# Patient Record
Sex: Female | Born: 1960 | Race: White | Hispanic: No | Marital: Single | Smoking: Former smoker
Health system: Southern US, Community
[De-identification: ages and names within clinical notes are randomized; demographics above are authoritative.]

## PROBLEM LIST (undated history)

## (undated) DIAGNOSIS — M545 Low back pain, unspecified: Secondary | ICD-10-CM

## (undated) DIAGNOSIS — R002 Palpitations: Secondary | ICD-10-CM

## (undated) DIAGNOSIS — N83209 Unspecified ovarian cyst, unspecified side: Secondary | ICD-10-CM

## (undated) DIAGNOSIS — Z923 Personal history of irradiation: Secondary | ICD-10-CM

## (undated) DIAGNOSIS — I471 Supraventricular tachycardia, unspecified: Secondary | ICD-10-CM

## (undated) DIAGNOSIS — C50312 Malignant neoplasm of lower-inner quadrant of left female breast: Secondary | ICD-10-CM

## (undated) DIAGNOSIS — Z171 Estrogen receptor negative status [ER-]: Secondary | ICD-10-CM

## (undated) DIAGNOSIS — D649 Anemia, unspecified: Secondary | ICD-10-CM

## (undated) DIAGNOSIS — K219 Gastro-esophageal reflux disease without esophagitis: Secondary | ICD-10-CM

## (undated) DIAGNOSIS — K5732 Diverticulitis of large intestine without perforation or abscess without bleeding: Secondary | ICD-10-CM

## (undated) DIAGNOSIS — Z8632 Personal history of gestational diabetes: Secondary | ICD-10-CM

## (undated) DIAGNOSIS — Z9221 Personal history of antineoplastic chemotherapy: Secondary | ICD-10-CM

## (undated) DIAGNOSIS — Z87442 Personal history of urinary calculi: Secondary | ICD-10-CM

## (undated) DIAGNOSIS — K621 Rectal polyp: Secondary | ICD-10-CM

## (undated) DIAGNOSIS — E119 Type 2 diabetes mellitus without complications: Secondary | ICD-10-CM

## (undated) DIAGNOSIS — E7800 Pure hypercholesterolemia, unspecified: Secondary | ICD-10-CM

## (undated) DIAGNOSIS — G8929 Other chronic pain: Secondary | ICD-10-CM

## (undated) DIAGNOSIS — E038 Other specified hypothyroidism: Secondary | ICD-10-CM

## (undated) DIAGNOSIS — E039 Hypothyroidism, unspecified: Secondary | ICD-10-CM

## (undated) DIAGNOSIS — E669 Obesity, unspecified: Secondary | ICD-10-CM

## (undated) DIAGNOSIS — E041 Nontoxic single thyroid nodule: Secondary | ICD-10-CM

## (undated) DIAGNOSIS — D121 Benign neoplasm of appendix: Secondary | ICD-10-CM

## (undated) HISTORY — DX: Benign neoplasm of appendix: D12.1

## (undated) HISTORY — PX: TUBAL LIGATION: SHX77

## (undated) HISTORY — DX: Palpitations: R00.2

## (undated) HISTORY — DX: Type 2 diabetes mellitus without complications: E11.9

## (undated) HISTORY — DX: Other specified hypothyroidism: E03.8

## (undated) HISTORY — DX: Diverticulitis of large intestine without perforation or abscess without bleeding: K57.32

## (undated) HISTORY — PX: OTHER SURGICAL HISTORY: SHX169

## (undated) HISTORY — PX: SMALL INTESTINE SURGERY: SHX150

## (undated) HISTORY — DX: Personal history of gestational diabetes: Z86.32

## (undated) HISTORY — DX: Unspecified ovarian cyst, unspecified side: N83.209

---
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
## 2007-12-09 HISTORY — PX: BLADDER SUSPENSION: SHX72

## 2009-09-06 ENCOUNTER — Ambulatory Visit: Payer: Self-pay | Admitting: Unknown Physician Specialty

## 2009-09-13 ENCOUNTER — Ambulatory Visit: Payer: Self-pay | Admitting: Unknown Physician Specialty

## 2009-10-11 ENCOUNTER — Ambulatory Visit: Payer: Self-pay | Admitting: Unknown Physician Specialty

## 2009-10-17 ENCOUNTER — Ambulatory Visit: Payer: Self-pay | Admitting: Unknown Physician Specialty

## 2010-02-23 ENCOUNTER — Emergency Department: Payer: Self-pay | Admitting: Internal Medicine

## 2012-08-08 DIAGNOSIS — K5732 Diverticulitis of large intestine without perforation or abscess without bleeding: Secondary | ICD-10-CM

## 2012-08-08 HISTORY — DX: Diverticulitis of large intestine without perforation or abscess without bleeding: K57.32

## 2012-08-13 LAB — COMPREHENSIVE METABOLIC PANEL
Albumin: 3.8 g/dL (ref 3.4–5.0)
Anion Gap: 11 (ref 7–16)
Bilirubin,Total: 0.6 mg/dL (ref 0.2–1.0)
Calcium, Total: 9.5 mg/dL (ref 8.5–10.1)
Chloride: 106 mmol/L (ref 98–107)
Co2: 23 mmol/L (ref 21–32)
EGFR (African American): >60
Osmolality: 279 (ref 275–301)
Potassium: 3.6 mmol/L (ref 3.5–5.1)
SGOT(AST): 23 U/L (ref 15–37)
Sodium: 140 mmol/L (ref 136–145)

## 2012-08-13 LAB — URINALYSIS, COMPLETE
Bilirubin,UR: NEGATIVE
Blood: NEGATIVE
Glucose,UR: NEGATIVE mg/dL (ref 0–75)
Ketone: NEGATIVE
Leukocyte Esterase: NEGATIVE
Ph: 5 (ref 4.5–8.0)
Squamous Epithelial: 5

## 2012-08-13 LAB — PROTIME-INR: Prothrombin Time: 13.8 secs (ref 11.5–14.7)

## 2012-08-13 LAB — CBC
HCT: 39.7 % (ref 35.0–47.0)
MCHC: 34.8 g/dL (ref 32.0–36.0)
RBC: 4.48 10*6/uL (ref 3.80–5.20)
RDW: 13.9 % (ref 11.5–14.5)

## 2012-08-13 LAB — ACETAMINOPHEN LEVEL: Acetaminophen: <2 ug/mL

## 2012-08-14 ENCOUNTER — Inpatient Hospital Stay: Payer: Self-pay | Admitting: Internal Medicine

## 2012-08-16 LAB — CBC WITH DIFFERENTIAL/PLATELET
Basophil #: 0.1 10*3/uL (ref 0.0–0.1)
Basophil %: 0.8 %
Eosinophil %: 2.8 %
Lymphocyte #: 2.5 10*3/uL (ref 1.0–3.6)
Lymphocyte %: 28.7 %
MCH: 30.8 pg (ref 26.0–34.0)
MCHC: 34.8 g/dL (ref 32.0–36.0)
MCV: 89 fL (ref 80–100)
Monocyte %: 7.7 %
Neutrophil #: 5.3 10*3/uL (ref 1.4–6.5)
Platelet: 285 10*3/uL (ref 150–440)
RBC: 3.86 10*6/uL (ref 3.80–5.20)
RDW: 13.8 % (ref 11.5–14.5)

## 2012-08-16 LAB — BASIC METABOLIC PANEL
Anion Gap: 8 (ref 7–16)
BUN: 5 mg/dL — ABNORMAL LOW (ref 7–18)
Calcium, Total: 8.5 mg/dL (ref 8.5–10.1)
EGFR (African American): >60
EGFR (Non-African Amer.): >60
Glucose: 145 mg/dL — ABNORMAL HIGH (ref 65–99)
Osmolality: 279 (ref 275–301)
Sodium: 140 mmol/L (ref 136–145)

## 2012-09-01 ENCOUNTER — Other Ambulatory Visit (HOSPITAL_COMMUNITY)
Admission: RE | Admit: 2012-09-01 | Discharge: 2012-09-01 | Disposition: A | Discharge status: Home / Self Care | Payer: PRIVATE HEALTH INSURANCE | Source: Ambulatory Visit | Attending: Family Medicine | Admitting: Family Medicine

## 2012-09-01 ENCOUNTER — Encounter: Payer: Self-pay | Admitting: Family Medicine

## 2012-09-01 ENCOUNTER — Encounter: Payer: Self-pay | Admitting: Internal Medicine

## 2012-09-01 ENCOUNTER — Ambulatory Visit (INDEPENDENT_AMBULATORY_CARE_PROVIDER_SITE_OTHER): Payer: PRIVATE HEALTH INSURANCE | Admitting: Family Medicine

## 2012-09-01 VITALS — BP 120/80 | HR 88 | Temp 97.9°F | Ht 65.25 in | Wt 217.0 lb

## 2012-09-01 DIAGNOSIS — Z01419 Encounter for gynecological examination (general) (routine) without abnormal findings: Secondary | ICD-10-CM | POA: Insufficient documentation

## 2012-09-01 DIAGNOSIS — Z1151 Encounter for screening for human papillomavirus (HPV): Secondary | ICD-10-CM | POA: Insufficient documentation

## 2012-09-01 DIAGNOSIS — Z1231 Encounter for screening mammogram for malignant neoplasm of breast: Secondary | ICD-10-CM

## 2012-09-01 DIAGNOSIS — N83209 Unspecified ovarian cyst, unspecified side: Secondary | ICD-10-CM | POA: Insufficient documentation

## 2012-09-01 DIAGNOSIS — E669 Obesity, unspecified: Secondary | ICD-10-CM | POA: Insufficient documentation

## 2012-09-01 DIAGNOSIS — Z136 Encounter for screening for cardiovascular disorders: Secondary | ICD-10-CM

## 2012-09-01 DIAGNOSIS — Z0000 Encounter for general adult medical examination without abnormal findings: Secondary | ICD-10-CM | POA: Insufficient documentation

## 2012-09-01 DIAGNOSIS — K5732 Diverticulitis of large intestine without perforation or abscess without bleeding: Secondary | ICD-10-CM | POA: Insufficient documentation

## 2012-09-01 DIAGNOSIS — Z1211 Encounter for screening for malignant neoplasm of colon: Secondary | ICD-10-CM

## 2012-09-01 LAB — COMPREHENSIVE METABOLIC PANEL
BUN: 10 mg/dL (ref 6–23)
CO2: 29 mEq/L (ref 19–32)
Calcium: 9.5 mg/dL (ref 8.4–10.5)
Chloride: 102 mEq/L (ref 96–112)
Creatinine, Ser: 0.9 mg/dL (ref 0.4–1.2)
GFR: 72.26 mL/min (ref >60.00)
Total Bilirubin: 0.7 mg/dL (ref 0.3–1.2)

## 2012-09-01 LAB — LIPID PANEL
Cholesterol: 221 mg/dL — ABNORMAL HIGH (ref 0–200)
HDL: 36.7 mg/dL — ABNORMAL LOW (ref >39.00)
Triglycerides: 179 mg/dL — ABNORMAL HIGH (ref 0.0–149.0)

## 2012-09-01 NOTE — Patient Instructions (Addendum)
It was wonderful to meet you. Please stop by to see Shirlee Limerick on your way out to set up your colonoscopy, ultrasound and mammogram  We will call you with your lab results and send a letter with your pap smear results if normal.  Health Maintenance, Females A healthy lifestyle and preventative care can promote health and wellness.  Maintain regular health, dental, and eye exams.   Eat a healthy diet. Foods like vegetables, fruits, whole grains, low-fat dairy products, and lean protein foods contain the nutrients you need without too many calories. Decrease your intake of foods high in solid fats, added sugars, and salt. Get information about a proper diet from your caregiver, if necessary.   Regular physical exercise is one of the most important things you can do for your health. Most adults should get at least 150 minutes of moderate-intensity exercise (any activity that increases your heart rate and causes you to sweat) each week. In addition, most adults need muscle-strengthening exercises on 2 or more days a week.    Maintain a healthy weight. The body mass index (BMI) is a screening tool to identify possible weight problems. It provides an estimate of body fat based on height and weight. Your caregiver can help determine your BMI, and can help you achieve or maintain a healthy weight. For adults 20 years and older:   A BMI below 18.5 is considered underweight.   A BMI of 18.5 to 24.9 is normal.   A BMI of 25 to 29.9 is considered overweight.   A BMI of 30 and above is considered obese.   Maintain normal blood lipids and cholesterol by exercising and minimizing your intake of saturated fat. Eat a balanced diet with plenty of fruits and vegetables. Blood tests for lipids and cholesterol should begin at age 62 and be repeated every 5 years. If your lipid or cholesterol levels are high, you are over 50, or you are a high risk for heart disease, you may need your cholesterol levels checked more  frequently.Ongoing high lipid and cholesterol levels should be treated with medicines if diet and exercise are not effective.   If you smoke, find out from your caregiver how to quit. If you do not use tobacco, do not start.   If you are pregnant, do not drink alcohol. If you are breastfeeding, be very cautious about drinking alcohol. If you are not pregnant and choose to drink alcohol, do not exceed 1 drink per day. One drink is considered to be 12 ounces (355 mL) of beer, 5 ounces (148 mL) of wine, or 1.5 ounces (44 mL) of liquor.   Avoid use of street drugs. Do not share needles with anyone. Ask for help if you need support or instructions about stopping the use of drugs.   High blood pressure causes heart disease and increases the risk of stroke. Blood pressure should be checked at least every 1 to 2 years. Ongoing high blood pressure should be treated with medicines, if weight loss and exercise are not effective.   If you are 56 to 50 years old, ask your caregiver if you should take aspirin to prevent strokes.   Diabetes screening involves taking a blood sample to check your fasting blood sugar level. This should be done once every 3 years, after age 57, if you are within normal weight and without risk factors for diabetes. Testing should be considered at a younger age or be carried out more frequently if you are overweight and have  at least 1 risk factor for diabetes.   Breast cancer screening is essential preventative care for women. You should practice "breast self-awareness." This means understanding the normal appearance and feel of your breasts and may include breast self-examination. Any changes detected, no matter how small, should be reported to a caregiver. Women in their 12s and 30s should have a clinical breast exam (CBE) by a caregiver as part of a regular health exam every 1 to 3 years. After age 61, women should have a CBE every year. Starting at age 67, women should consider  having a mammogram (breast X-ray) every year. Women who have a family history of breast cancer should talk to their caregiver about genetic screening. Women at a high risk of breast cancer should talk to their caregiver about having an MRI and a mammogram every year.   The Pap test is a screening test for cervical cancer. Women should have a Pap test starting at age 90. Between ages 74 and 103, Pap tests should be repeated every 2 years. Beginning at age 73, you should have a Pap test every 3 years as long as the past 3 Pap tests have been normal. If you had a hysterectomy for a problem that was not cancer or a condition that could lead to cancer, then you no longer need Pap tests. If you are between ages 46 and 34, and you have had normal Pap tests going back 10 years, you no longer need Pap tests. If you have had past treatment for cervical cancer or a condition that could lead to cancer, you need Pap tests and screening for cancer for at least 20 years after your treatment. If Pap tests have been discontinued, risk factors (such as a new sexual partner) need to be reassessed to determine if screening should be resumed. Some women have medical problems that increase the chance of getting cervical cancer. In these cases, your caregiver may recommend more frequent screening and Pap tests.   The human papillomavirus (HPV) test is an additional test that may be used for cervical cancer screening. The HPV test looks for the virus that can cause the cell changes on the cervix. The cells collected during the Pap test can be tested for HPV. The HPV test could be used to screen women aged 19 years and older, and should be used in women of any age who have unclear Pap test results. After the age of 1, women should have HPV testing at the same frequency as a Pap test.   Colorectal cancer can be detected and often prevented. Most routine colorectal cancer screening begins at the age of 72 and continues through age 49.  However, your caregiver may recommend screening at an earlier age if you have risk factors for colon cancer. On a yearly basis, your caregiver may provide home test kits to check for hidden blood in the stool. Use of a small camera at the end of a tube, to directly examine the colon (sigmoidoscopy or colonoscopy), can detect the earliest forms of colorectal cancer. Talk to your caregiver about this at age 2, when routine screening begins. Direct examination of the colon should be repeated every 5 to 10 years through age 66, unless early forms of pre-cancerous polyps or small growths are found.   Hepatitis C blood testing is recommended for all people born from [birth year] through 1965 and any individual with known risks for hepatitis C.   Practice safe sex. Use condoms and avoid high-risk sexual  practices to reduce the spread of sexually transmitted infections (STIs). Sexually active women aged 63 and younger should be checked for Chlamydia, which is a common sexually transmitted infection. Older women with new or multiple partners should also be tested for Chlamydia. Testing for other STIs is recommended if you are sexually active and at increased risk.   Osteoporosis is a disease in which the bones lose minerals and strength with aging. This can result in serious bone fractures. The risk of osteoporosis can be identified using a bone density scan. Women ages 42 and over and women at risk for fractures or osteoporosis should discuss screening with their caregivers. Ask your caregiver whether you should be taking a calcium supplement or vitamin D to reduce the rate of osteoporosis.   Menopause can be associated with physical symptoms and risks. Hormone replacement therapy is available to decrease symptoms and risks. You should talk to your caregiver about whether hormone replacement therapy is right for you.   Use sunscreen with a sun protection factor (SPF) of 30 or greater. Apply sunscreen liberally and  repeatedly throughout the day. You should seek shade when your shadow is shorter than you. Protect yourself by wearing long sleeves, pants, a wide-brimmed hat, and sunglasses year round, whenever you are outdoors.   Notify your caregiver of new moles or changes in moles, especially if there is a change in shape or color. Also notify your caregiver if a mole is larger than the size of a pencil eraser.   Stay current with your immunizations.  Document Released: 06/09/2011 Document Revised: 11/13/2011 Document Reviewed: 06/09/2011 [facility] Patient Information 2012 [place], [place].

## 2012-09-01 NOTE — Progress Notes (Signed)
Subjective:    Patient ID: Rebecca Macias, female    DOB: [birth year], 51 y.o.   MRN: [id number]  HPI  51 yo very pleasant G6P5 with no significant medical problems here to establish care, for CPX and hospital follow up.  Has not been to a physician in several years.  Admitted to [facility] 08/14/2012 - 08/17/2012.  Notes reviewed.  Admitted for acute sigmoid diverticulitis. CT scan of abdomen and pelvis on 9/7 showed sigmoid diverticulitis with ?suspected perforation.  No abscess.  Incidentally, a 2 cm right ovarian cyst was also noted.  General surgery was consulted and conservative, non surgical intervention was recommended.  Symptoms improved with IV cipro and flagyl.  Discharged home with two week course of cipro and flagyl.  Symptoms have resolved.  Has never had a colonoscopy. No blood in stool.  Periods are becoming irregular- has not had one in 4 months.  No h/o abnormal pap smears, no family h/o breast, uterine or ovarian CA.  Patient Active Problem List  Diagnosis  . Sigmoid diverticulitis  . Ovarian cyst  . Routine general medical examination at a health care facility   Past Medical History  Diagnosis Date  . Sigmoid diverticulitis 08/2012  . Ovarian cyst   . H/O gestational diabetes mellitus, not currently pregnant    Past Surgical History  Procedure Date  . Tubal ligation   . Bladder suspension 2009   History  Substance Use Topics  . Smoking status: Former Games developer  . Smokeless tobacco: Not on file  . Alcohol Use: Not on file   Family History  Problem Relation Age of Onset  . Heart disease Father   . Kidney disease Father    No Known Allergies No current outpatient prescriptions on file prior to visit.   The PMH, PSH, Social History, Family History, Medications, and allergies have been reviewed in [facility], and have been updated if relevant.   Review of Systems See HPI Patient reports no  vision/ hearing changes,anorexia, weight change, fever  ,adenopathy, persistant / recurrent hoarseness, swallowing issues, chest pain, edema,persistant / recurrent cough, hemoptysis, dyspnea(rest, exertional, paroxysmal nocturnal), gastrointestinal  bleeding (melena, rectal bleeding), abdominal pain, excessive heart burn, GU symptoms(dysuria, hematuria, pyuria, voiding/incontinence  Issues) syncope, focal weakness, severe memory loss, concerning skin lesions, depression, anxiety, abnormal bruising/bleeding, major joint swelling, breast masses     Objective:   Physical Exam BP 120/80  Pulse 88  Temp 97.9 F (36.6 C)  Ht 5' 5.25" (1.657 m)  Wt 217 lb (98.431 kg)  BMI 35.83 kg/m2  General:  Obese, pleasant female,well-nourished,in no acute distress; alert,appropriate and cooperative throughout examination Head:  normocephalic and atraumatic.   Eyes:  vision grossly intact, pupils equal, pupils round, and pupils reactive to light.   Ears:  R ear normal and L ear normal.   Nose:  no external deformity.   Mouth:  good dentition.   Neck:  No deformities, masses, or tenderness noted. Breasts:  No mass, nodules, thickening, tenderness, bulging, retraction, inflamation, nipple discharge  Bilateral raised lesions (?SK) on nipples bilaterally- per pt have been there for as long as she can remember Lungs:  Normal respiratory effort, chest expands symmetrically. Lungs are clear to auscultation, no crackles or wheezes. Heart:  Normal rate and regular rhythm. S1 and S2 normal without gallop, murmur, click, rub or other extra sounds. Abdomen:  Bowel sounds positive,abdomen soft and non-tender without masses, organomegaly or hernias noted. Rectal:  no external abnormalities.   Genitalia:  Pelvic Exam:  External: normal female genitalia without lesions or masses        Vagina: normal without lesions or masses        Cervix: normal without lesions or masses        Adnexa: normal bimanual exam without masses or fullness        Uterus: normal by palpation         Pap smear: performed Msk:  No deformity or scoliosis noted of thoracic or lumbar spine.   Extremities:  No clubbing, cyanosis, edema, or deformity noted with normal full range of motion of all joints.   Neurologic:  alert & oriented X3 and gait normal.   Skin:  Intact without suspicious lesions or rashes Cervical Nodes:  No lymphadenopathy noted Axillary Nodes:  No palpable lymphadenopathy Psych:  Cognition and judgment appear intact. Alert and cooperative with normal attention span and concentration. No apparent delusions, illusions, hallucinations    Assessment & Plan:   1. Routine general medical examination at a health care facility  Reviewed preventive care protocols, scheduled due services, and updated immunizations Discussed nutrition, exercise, diet, and healthy lifestyle.  Comprehensive metabolic panel, Cytology - PAP  2. Sigmoid diverticulitis  S/p conservative management with abx. Ambulatory referral to Gastroenterology  3. Ovarian cyst  New on CT- will order pelvic ultrasound for further evaluation. The patient indicates understanding of these issues and agrees with the plan.  US Pelvis Complete, US Transvaginal Non-OB  4. Other screening mammogram  MM Digital Screening  5. Screening for ischemic heart disease  Lipid Panel  6. Screening for colon cancer  Ambulatory referral to Gastroenterology  7. Obesity (BMI 35.0-39.9 without comorbidity)  She walks quite a bit at work.  Has gained weight since quitting smoking.  She is interested in losing weight.  Will work on diet and call me in a few months.

## 2012-09-02 ENCOUNTER — Encounter: Payer: Self-pay | Admitting: *Deleted

## 2012-09-07 ENCOUNTER — Encounter: Payer: Self-pay | Admitting: Family Medicine

## 2012-09-07 ENCOUNTER — Encounter: Payer: Self-pay | Admitting: *Deleted

## 2012-09-13 ENCOUNTER — Encounter: Payer: Self-pay | Admitting: Family Medicine

## 2012-09-13 ENCOUNTER — Other Ambulatory Visit: Payer: Self-pay | Admitting: Family Medicine

## 2012-09-13 ENCOUNTER — Ambulatory Visit: Payer: Self-pay | Admitting: Family Medicine

## 2012-09-13 DIAGNOSIS — N83209 Unspecified ovarian cyst, unspecified side: Secondary | ICD-10-CM

## 2012-09-18 ENCOUNTER — Ambulatory Visit: Payer: Self-pay | Admitting: Family Medicine

## 2012-09-20 ENCOUNTER — Encounter: Payer: Self-pay | Admitting: Family Medicine

## 2012-09-28 ENCOUNTER — Ambulatory Visit: Payer: Self-pay | Admitting: Family Medicine

## 2012-10-26 ENCOUNTER — Ambulatory Visit (AMBULATORY_SURGERY_CENTER): Payer: Commercial Managed Care - PPO

## 2012-10-26 VITALS — Ht 65.0 in | Wt 222.6 lb

## 2012-10-26 DIAGNOSIS — Z1211 Encounter for screening for malignant neoplasm of colon: Secondary | ICD-10-CM

## 2012-10-26 MED ORDER — MOVIPREP 100 G PO SOLR: ORAL | Status: DC

## 2012-11-09 ENCOUNTER — Encounter: Payer: Self-pay | Admitting: Internal Medicine

## 2012-11-09 ENCOUNTER — Ambulatory Visit (AMBULATORY_SURGERY_CENTER): Payer: Commercial Managed Care - PPO | Admitting: Internal Medicine

## 2012-11-09 VITALS — BP 127/72 | HR 89 | Temp 99.0°F | Resp 18 | Ht 65.0 in | Wt 222.0 lb

## 2012-11-09 DIAGNOSIS — R933 Abnormal findings on diagnostic imaging of other parts of digestive tract: Secondary | ICD-10-CM

## 2012-11-09 DIAGNOSIS — Z1211 Encounter for screening for malignant neoplasm of colon: Secondary | ICD-10-CM

## 2012-11-09 MED ORDER — SODIUM CHLORIDE 0.9 % IV SOLN: 500.0000 mL | INTRAVENOUS | Status: DC

## 2012-11-09 NOTE — Op Note (Signed)
[facility] [address] [place], [address]   COLONOSCOPY PROCEDURE REPORT  PATIENT: Rebecca, Macias  MR#: [id number] BIRTHDATE: [birth year] , 51  yrs. old GENDER: Female ENDOSCOPIST: Hart Carwin, MD REFERRED BY:  Ruthe Mannan, M.D. PROCEDURE DATE:  11/09/2012 PROCEDURE:   Colonoscopy, screening and Colonoscopy, diagnostic ASA CLASS:   Class I INDICATIONS:Average risk patient for colon cancer and recent diverticulitis with confined perforation. MEDICATIONS: MAC sedation, administered by CRNA, Fentanyl-Quick Pick, and propofol (Diprivan) 300mg  IV  DESCRIPTION OF PROCEDURE:   After the risks and benefits and of the procedure were explained, informed consent was obtained.  A digital rectal exam revealed no abnormalities of the rectum.    The LB [id number]-[id number]  endoscope was introduced through the anus and advanced to the cecum, which was identified by both the appendix and ileocecal valve .  The quality of the prep was good, using MoviPrep .  The instrument was then slowly withdrawn as the colon was fully examined.     COLON FINDINGS: There was moderate diverticulosis noted in the sigmoid colon with associated colonic narrowing and muscular hypertrophy.     Retroflexed views revealed no abnormalities. The scope was then withdrawn from the patient and the procedure completed.  COMPLICATIONS: There were no complications. ENDOSCOPIC IMPRESSION: There was moderate diverticulosis noted in the sigmoid colon between 25-30 cm, no active inflammation, no obstruction  RECOMMENDATIONS: High fiber diet Metamucil 1 tsp daily consider segmental resection of the colon to prevent future attacks   REPEAT EXAM: In 10 year(s)  for Colonoscopy.  cc:  _______________________________ eSignedHart Carwin, MD 11/09/2012 9:40 AM     PATIENT NAME:  Rebecca, Macias MR#: [id number]

## 2012-11-09 NOTE — Patient Instructions (Signed)
METAMUCIL 1 TSP. DAILY. HIGH FIBER DIET.   YOU HAD AN ENDOSCOPIC PROCEDURE TODAY AT THE [facility]: Refer to the procedure report that was given to you for any specific questions about what was found during the examination.  If the procedure report does not answer your questions, please call your gastroenterologist to clarify.  If you requested that your care partner not be given the details of your procedure findings, then the procedure report has been included in a sealed envelope for you to review at your convenience later.  YOU SHOULD EXPECT: Some feelings of bloating in the abdomen. Passage of more gas than usual.  Walking can help get rid of the air that was put into your GI tract during the procedure and reduce the bloating. If you had a lower endoscopy (such as a colonoscopy or flexible sigmoidoscopy) you may notice spotting of blood in your stool or on the toilet paper. If you underwent a bowel prep for your procedure, then you may not have a normal bowel movement for a few days.  DIET: Your first meal following the procedure should be a light meal and then it is ok to progress to your normal diet.  A half-sandwich or bowl of soup is an example of a good first meal.  Heavy or fried foods are harder to digest and may make you feel nauseous or bloated.  Likewise meals heavy in dairy and vegetables can cause extra gas to form and this can also increase the bloating.  Drink plenty of fluids but you should avoid alcoholic beverages for 24 hours.  ACTIVITY: Your care partner should take you home directly after the procedure.  You should plan to take it easy, moving slowly for the rest of the day.  You can resume normal activity the day after the procedure however you should NOT DRIVE or use heavy machinery for 24 hours (because of the sedation medicines used during the test).    SYMPTOMS TO REPORT IMMEDIATELY: A gastroenterologist can be reached at any hour.  During normal business  hours, 8:30 AM to 5:00 PM Monday through Friday, call [phone].  After hours and on weekends, please call the GI answering service at [phone] who will take a message and have the physician on call contact you.   Following lower endoscopy (colonoscopy or flexible sigmoidoscopy):  Excessive amounts of blood in the stool  Significant tenderness or worsening of abdominal pains  Swelling of the abdomen that is new, acute  Fever of 100F or higher  FOLLOW UP: If any biopsies were taken you will be contacted by phone or by letter within the next 1-3 weeks.  Call your gastroenterologist if you have not heard about the biopsies in 3 weeks.  Our staff will call the home number listed on your records the next business day following your procedure to check on you and address any questions or concerns that you may have at that time regarding the information given to you following your procedure. This is a courtesy call and so if there is no answer at the home number and we have not heard from you through the emergency physician on call, we will assume that you have returned to your regular daily activities without incident.  SIGNATURES/CONFIDENTIALITY: You and/or your care partner have signed paperwork which will be entered into your electronic medical record.  These signatures attest to the fact that that the information above on your After Visit Summary has been reviewed and is  understood.  Full responsibility of the confidentiality of this discharge information lies with you and/or your care-partner.

## 2012-11-09 NOTE — Progress Notes (Signed)
Pt stable  Report to RN Awake talkative

## 2012-11-09 NOTE — Progress Notes (Signed)
Patient did not experience any of the following events: a burn prior to discharge; a fall within the facility; wrong site/side/patient/procedure/implant event; or a hospital transfer or hospital admission upon discharge from the facility. (G8907) Patient did not have preoperative order for IV antibiotic SSI prophylaxis. (G8918)  

## 2012-11-10 ENCOUNTER — Telehealth: Payer: Self-pay | Admitting: *Deleted

## 2012-11-10 NOTE — Telephone Encounter (Signed)
  Follow up Call-  Call back number 11/09/2012  Post procedure Call Back phone  # [phone]  Permission to leave phone message Yes     Patient questions:  Do you have a fever, pain , or abdominal swelling? no Pain Score  0 *  Have you tolerated food without any problems? yes  Have you been able to return to your normal activities? yes  Do you have any questions about your discharge instructions: Diet   no Medications  no Follow up visit  no  Do you have questions or concerns about your Care? no  Actions: * If pain score is 4 or above: No action needed, pain <4.

## 2012-12-10 ENCOUNTER — Other Ambulatory Visit: Payer: PRIVATE HEALTH INSURANCE

## 2012-12-16 ENCOUNTER — Other Ambulatory Visit (INDEPENDENT_AMBULATORY_CARE_PROVIDER_SITE_OTHER): Payer: Commercial Managed Care - PPO

## 2012-12-16 DIAGNOSIS — E785 Hyperlipidemia, unspecified: Secondary | ICD-10-CM

## 2012-12-16 LAB — LIPID PANEL
Cholesterol: 218 mg/dL — ABNORMAL HIGH (ref 0–200)
HDL: 39 mg/dL — ABNORMAL LOW (ref >39.00)
Total CHOL/HDL Ratio: 6
Triglycerides: 200 mg/dL — ABNORMAL HIGH (ref 0.0–149.0)
VLDL: 40 mg/dL (ref 0.0–40.0)

## 2012-12-16 LAB — LDL CHOLESTEROL, DIRECT: Direct LDL: 144.9 mg/dL

## 2013-09-18 ENCOUNTER — Inpatient Hospital Stay: Payer: Self-pay | Admitting: Surgery

## 2013-09-18 LAB — URINALYSIS, COMPLETE
Bilirubin,UR: NEGATIVE
Blood: NEGATIVE
Glucose,UR: NEGATIVE mg/dL (ref 0–75)
Ketone: NEGATIVE
Nitrite: NEGATIVE
Ph: 6 (ref 4.5–8.0)
Protein: NEGATIVE
Squamous Epithelial: 7

## 2013-09-18 LAB — CBC
HGB: 13.8 g/dL (ref 12.0–16.0)
MCH: 30 pg (ref 26.0–34.0)
MCV: 87 fL (ref 80–100)
RBC: 4.6 10*6/uL (ref 3.80–5.20)

## 2013-09-18 LAB — LIPASE, BLOOD: Lipase: 210 U/L (ref 73–393)

## 2013-09-18 LAB — COMPREHENSIVE METABOLIC PANEL
Alkaline Phosphatase: 106 U/L (ref 50–136)
Anion Gap: 6 — ABNORMAL LOW (ref 7–16)
Bilirubin,Total: 0.4 mg/dL (ref 0.2–1.0)
Chloride: 103 mmol/L (ref 98–107)
Co2: 28 mmol/L (ref 21–32)
EGFR (African American): >60
Osmolality: 277 (ref 275–301)
Potassium: 4 mmol/L (ref 3.5–5.1)
SGOT(AST): 21 U/L (ref 15–37)
SGPT (ALT): 22 U/L (ref 12–78)
Sodium: 137 mmol/L (ref 136–145)

## 2013-09-19 LAB — CBC WITH DIFFERENTIAL/PLATELET
Eosinophil %: 1.6 %
HCT: 35.8 % (ref 35.0–47.0)
Lymphocyte #: 3.3 10*3/uL (ref 1.0–3.6)
MCH: 30.2 pg (ref 26.0–34.0)
MCHC: 34.6 g/dL (ref 32.0–36.0)
MCV: 87 fL (ref 80–100)
Monocyte #: 0.9 x10 3/mm (ref 0.2–0.9)
Monocyte %: 8.7 %
Platelet: 341 10*3/uL (ref 150–440)
RBC: 4.11 10*6/uL (ref 3.80–5.20)

## 2013-09-20 LAB — URINE CULTURE

## 2013-10-20 ENCOUNTER — Ambulatory Visit: Payer: Self-pay | Admitting: Surgery

## 2013-12-16 ENCOUNTER — Other Ambulatory Visit: Payer: Self-pay | Admitting: Family Medicine

## 2013-12-16 ENCOUNTER — Telehealth: Payer: Self-pay | Admitting: *Deleted

## 2013-12-16 ENCOUNTER — Ambulatory Visit (INDEPENDENT_AMBULATORY_CARE_PROVIDER_SITE_OTHER): Payer: Commercial Managed Care - PPO | Admitting: Family Medicine

## 2013-12-16 ENCOUNTER — Encounter: Payer: Self-pay | Admitting: Family Medicine

## 2013-12-16 VITALS — BP 128/80 | HR 106 | Temp 98.1°F | Ht 65.0 in | Wt 226.0 lb

## 2013-12-16 DIAGNOSIS — R000 Tachycardia, unspecified: Secondary | ICD-10-CM

## 2013-12-16 DIAGNOSIS — E0510 Thyrotoxicosis with toxic single thyroid nodule without thyrotoxic crisis or storm: Secondary | ICD-10-CM

## 2013-12-16 DIAGNOSIS — R7989 Other specified abnormal findings of blood chemistry: Secondary | ICD-10-CM

## 2013-12-16 DIAGNOSIS — I471 Supraventricular tachycardia: Secondary | ICD-10-CM

## 2013-12-16 HISTORY — DX: Thyrotoxicosis with toxic single thyroid nodule without thyrotoxic crisis or storm: E05.10

## 2013-12-16 LAB — T4, FREE: Free T4: 0.79 ng/dL (ref 0.60–1.60)

## 2013-12-16 LAB — TSH: TSH: 0.14 u[IU]/mL — ABNORMAL LOW (ref 0.35–5.50)

## 2013-12-16 LAB — D-DIMER, QUANTITATIVE (NOT AT ARMC): D DIMER QUANT: 0.31 ug{FEU}/mL (ref 0.00–0.48)

## 2013-12-16 NOTE — Progress Notes (Signed)
   Subjective:    Patient ID: Rebecca Macias, female    DOB: 1961-09-24, 53 y.o.   MRN: [id number]  HPI   Very pleasant 53 yo female here for rapid heart rate.  Drank "a pot of coffee" yesterday to stay awake.  Since then, intermittent episodes of racing heart beat and awareness of heart beat. No CP or SOB. No n/v/d. No DOE. Some loose stools.  No cardiac history.  Former smoker.  Patient Active Problem List   Diagnosis Date Noted  . Rapid heart beat 12/16/2013  . Obesity (BMI 35.0-39.9 without comorbidity) 09/01/2012  . Sigmoid diverticulitis   . Ovarian cyst    Past Medical History  Diagnosis Date  . Sigmoid diverticulitis 08/2012  . Ovarian cyst   . H/O gestational diabetes mellitus, not currently pregnant    Past Surgical History  Procedure Laterality Date  . Tubal ligation    . Bladder suspension  2009   History  Substance Use Topics  . Smoking status: Former Smoker    Quit date: 09/19/2011  . Smokeless tobacco: Never Used  . Alcohol Use: No   Family History  Problem Relation Age of Onset  . Heart disease Father   . Kidney disease Father   . Diabetes Father   . Diabetes Maternal Grandmother   . Diabetes Paternal Grandmother    No Known Allergies No current outpatient prescriptions on file prior to visit.   No current facility-administered medications on file prior to visit.   The PMH, PSH, Social History, Family History, Medications, and allergies have been reviewed in [facility], and have been updated if relevant.    Review of Systems    See HPI Objective:   Physical Exam BP 128/80  Pulse 106  Temp(Src) 98.1 F (36.7 C) (Oral)  Ht 5\' 5"  (1.651 m)  Wt 226 lb (102.513 kg)  BMI 37.61 kg/m2  SpO2 96% Pulse decreased to 95 on exam  General:  Well-developed,well-nourished,in no acute distress; alert,appropriate and cooperative throughout examination Head:  normocephalic and atraumatic.   Lungs:  Normal respiratory effort, chest expands symmetrically.  Lungs are clear to auscultation, no crackles or wheezes. Heart:  tachycardia and regular rhythm. S1 and S2 normal without gallop, murmur, click, rub or other extra sounds. Extremities:  No clubbing, cyanosis, edema, or deformity noted with normal full range of motion of all joints.   Neurologic:  alert & oriented X3 and gait normal.   Skin:  Intact without suspicious lesions or rashes Psych:  Cognition and judgment appear intact. Alert and cooperative with normal attention span and concentration. No apparent delusions, illusions, hallucinations       Assessment & Plan:

## 2013-12-16 NOTE — Patient Instructions (Signed)
Great to see you. We will call you with your lab results.  I think this is likely from too much caffeine.  PLEASE do not drink any caffeine for next several days.  If you develop chest pain, please go to the ER.

## 2013-12-16 NOTE — Telephone Encounter (Signed)
Message copied by Modena Nunnery on Fri Dec 16, 2013  9:16 AM ------      Message from: Lucille Passy      Created: Fri Dec 16, 2013  8:39 AM       Good morning, Earl Lagos!      Please call this pt and ask her to come on in if she can.  This sounds like it could be urgent.  I will come back from nursing home in a few minutes.      Thanks!      Talia ------

## 2013-12-16 NOTE — Telephone Encounter (Signed)
Great. Thank you.

## 2013-12-16 NOTE — Telephone Encounter (Signed)
Spoke to pt who states that this is not an urgent matter. She drank an entire pot of coffee on yesterday, which she thinks may be an attributing factor. States that her scheduled appt time is fine.

## 2013-12-16 NOTE — Progress Notes (Signed)
Pre-visit discussion using our clinic review tool. No additional management support is needed unless otherwise documented below in the visit note.  

## 2013-12-16 NOTE — Assessment & Plan Note (Signed)
EKG reassuring- NSR. Likely due to caffeine but will check thyroid function and d dimer today. Pt aware to go to ER if symptoms worsen and to avoid caffeine. She will call me next week with an update.

## 2013-12-26 ENCOUNTER — Ambulatory Visit: Payer: Self-pay | Admitting: Surgery

## 2013-12-26 LAB — BASIC METABOLIC PANEL
Anion Gap: 3 — ABNORMAL LOW (ref 7–16)
BUN: 11 mg/dL (ref 7–18)
CALCIUM: 9.4 mg/dL (ref 8.5–10.1)
Chloride: 105 mmol/L (ref 98–107)
Co2: 29 mmol/L (ref 21–32)
Creatinine: 0.74 mg/dL (ref 0.60–1.30)
EGFR (Non-African Amer.): >60
Glucose: 147 mg/dL — ABNORMAL HIGH (ref 65–99)
Osmolality: 276 (ref 275–301)
Potassium: 4.1 mmol/L (ref 3.5–5.1)
SODIUM: 137 mmol/L (ref 136–145)

## 2013-12-26 LAB — CBC WITH DIFFERENTIAL/PLATELET
BASOS ABS: 0.1 10*3/uL (ref 0.0–0.1)
Basophil %: 1.7 %
EOS PCT: 2.7 %
Eosinophil #: 0.2 10*3/uL (ref 0.0–0.7)
HCT: 40.7 % (ref 35.0–47.0)
HGB: 13.6 g/dL (ref 12.0–16.0)
Lymphocyte #: 3 10*3/uL (ref 1.0–3.6)
Lymphocyte %: 42.4 %
MCH: 29.6 pg (ref 26.0–34.0)
MCHC: 33.3 g/dL (ref 32.0–36.0)
MCV: 89 fL (ref 80–100)
MONOS PCT: 5.7 %
Monocyte #: 0.4 x10 3/mm (ref 0.2–0.9)
NEUTROS ABS: 3.3 10*3/uL (ref 1.4–6.5)
Neutrophil %: 47.5 %
PLATELETS: 284 10*3/uL (ref 150–440)
RBC: 4.58 10*6/uL (ref 3.80–5.20)
RDW: 14 % (ref 11.5–14.5)
WBC: 7 10*3/uL (ref 3.6–11.0)

## 2013-12-29 ENCOUNTER — Encounter: Payer: Self-pay | Admitting: Internal Medicine

## 2013-12-29 ENCOUNTER — Ambulatory Visit (INDEPENDENT_AMBULATORY_CARE_PROVIDER_SITE_OTHER): Payer: Commercial Managed Care - PPO | Admitting: Internal Medicine

## 2013-12-29 VITALS — BP 118/80 | HR 75 | Temp 97.9°F | Wt 221.5 lb

## 2013-12-29 DIAGNOSIS — R05 Cough: Secondary | ICD-10-CM

## 2013-12-29 DIAGNOSIS — B9789 Other viral agents as the cause of diseases classified elsewhere: Secondary | ICD-10-CM

## 2013-12-29 DIAGNOSIS — R059 Cough, unspecified: Secondary | ICD-10-CM

## 2013-12-29 DIAGNOSIS — B349 Viral infection, unspecified: Secondary | ICD-10-CM

## 2013-12-29 MED ORDER — HYDROCODONE-HOMATROPINE 5-1.5 MG/5ML PO SYRP: 5.0000 mL | ORAL_SOLUTION | Freq: Three times a day (TID) | ORAL | Status: DC | PRN

## 2013-12-29 NOTE — Patient Instructions (Signed)

## 2013-12-29 NOTE — Progress Notes (Signed)
Pre-visit discussion using our clinic review tool. No additional management support is needed unless otherwise documented below in the visit note.  

## 2013-12-29 NOTE — Progress Notes (Signed)
HPI  Pt presents to the clinic today with c/o sore throat and cough. This started about 2 days ago. She has also had PND, nasal congestion. The cough is non productive. It seems to be worse at night. She denies fever, chills or body aches. She has tried benadryl which only seemed to help her sleep but nothing else. She does report that she is scheduled for surgery- a colon resection this upcoming Monday.  Review of Systems      Past Medical History  Diagnosis Date  . Sigmoid diverticulitis 08/2012  . Ovarian cyst   . H/O gestational diabetes mellitus, not currently pregnant     Family History  Problem Relation Age of Onset  . Heart disease Father   . Kidney disease Father   . Diabetes Father   . Diabetes Maternal Grandmother   . Diabetes Paternal Grandmother     History   Social History  . Marital Status: Divorced    Spouse Name: N/A    Number of Children: N/A  . Years of Education: N/A   Occupational History  . Not on file.   Social History Main Topics  . Smoking status: Former Smoker    Quit date: 09/19/2011  . Smokeless tobacco: Never Used  . Alcohol Use: No  . Drug Use: No  . Sexual Activity: Not on file   Other Topics Concern  . 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    No Known Allergies   Constitutional: Positive fatigue. Denies headache, fever or abrupt weight changes.  HEENT:  Positive sore throat. Denies eye redness, eye pain, pressure behind the eyes, facial pain, nasal congestion, ear pain, ringing in the ears, wax buildup, runny nose or bloody nose. Respiratory: Positive cough. Denies difficulty breathing or shortness of breath.  Cardiovascular: Denies chest pain, chest tightness, palpitations or swelling in the hands or feet.   No other specific complaints in a complete review of systems (except as listed in HPI above).  Objective:   BP 118/80  Pulse 75   Temp(Src) 97.9 F (36.6 C) (Oral)  Wt 221 lb 8 oz (100.472 kg)  SpO2 98% Wt Readings from Last 3 Encounters:  12/29/13 221 lb 8 oz (100.472 kg)  12/16/13 226 lb (102.513 kg)  11/09/12 222 lb (100.699 kg)     General: Appears her stated age, well developed, well nourished in NAD. HEENT: Head: normal shape and size; Eyes: sclera white, no icterus, conjunctiva pink, PERRLA and EOMs intact; Ears: Tm's gray and intact, normal light reflex; Nose: mucosa pink and moist, septum midline; Throat/Mouth: + PND. Teeth present, mucosa erythematous and moist, no exudate noted, no lesions or ulcerations noted.  Neck: Neck supple, trachea midline. No massses, lumps or thyromegaly present.  Cardiovascular: Normal rate and rhythm. S1,S2 noted.  No murmur, rubs or gallops noted. No JVD or BLE edema. No carotid bruits noted. Pulmonary/Chest: Normal effort and positive vesicular breath sounds. No respiratory distress. No wheezes, rales or ronchi noted.      Assessment & Plan:   Cough, likely viral at this point:  Get some rest and drink plenty of water Do salt water gargles for the sore throat eRx for Hycodan cough syrup Call your surgeon, he may want to postpone your surgery if you are not feeling better  RTC as needed or if symptoms persist or worsen.

## 2014-01-02 ENCOUNTER — Inpatient Hospital Stay: Payer: Self-pay | Admitting: Surgery

## 2014-01-03 LAB — CBC WITH DIFFERENTIAL/PLATELET
BASOS ABS: 0 10*3/uL (ref 0.0–0.1)
BASOS PCT: 0.2 %
EOS PCT: 0 %
Eosinophil #: 0 10*3/uL (ref 0.0–0.7)
HCT: 37.3 % (ref 35.0–47.0)
HGB: 12.4 g/dL (ref 12.0–16.0)
LYMPHS PCT: 23.9 %
Lymphocyte #: 2.7 10*3/uL (ref 1.0–3.6)
MCH: 29.4 pg (ref 26.0–34.0)
MCHC: 33.2 g/dL (ref 32.0–36.0)
MCV: 89 fL (ref 80–100)
MONO ABS: 0.7 x10 3/mm (ref 0.2–0.9)
MONOS PCT: 6.6 %
Neutrophil #: 7.7 10*3/uL — ABNORMAL HIGH (ref 1.4–6.5)
Neutrophil %: 69.3 %
PLATELETS: 281 10*3/uL (ref 150–440)
RBC: 4.21 10*6/uL (ref 3.80–5.20)
RDW: 13.8 % (ref 11.5–14.5)
WBC: 11.2 10*3/uL — AB (ref 3.6–11.0)

## 2014-01-03 LAB — BASIC METABOLIC PANEL
Anion Gap: 5 — ABNORMAL LOW (ref 7–16)
BUN: 8 mg/dL (ref 7–18)
CO2: 29 mmol/L (ref 21–32)
Calcium, Total: 9.1 mg/dL (ref 8.5–10.1)
Chloride: 102 mmol/L (ref 98–107)
Creatinine: 0.83 mg/dL (ref 0.60–1.30)
EGFR (African American): >60
GLUCOSE: 151 mg/dL — AB (ref 65–99)
Osmolality: 273 (ref 275–301)
Potassium: 4.5 mmol/L (ref 3.5–5.1)
Sodium: 136 mmol/L (ref 136–145)

## 2014-01-04 LAB — PATHOLOGY REPORT

## 2014-01-05 LAB — CBC WITH DIFFERENTIAL/PLATELET
Basophil #: 0.1 10*3/uL (ref 0.0–0.1)
Basophil %: 0.7 %
EOS ABS: 0.3 10*3/uL (ref 0.0–0.7)
Eosinophil %: 3.4 %
HCT: 32.5 % — ABNORMAL LOW (ref 35.0–47.0)
HGB: 11.1 g/dL — AB (ref 12.0–16.0)
LYMPHS PCT: 31.8 %
Lymphocyte #: 2.9 10*3/uL (ref 1.0–3.6)
MCH: 30.6 pg (ref 26.0–34.0)
MCHC: 34.2 g/dL (ref 32.0–36.0)
MCV: 89 fL (ref 80–100)
Monocyte #: 0.6 x10 3/mm (ref 0.2–0.9)
Monocyte %: 6.9 %
NEUTROS ABS: 5.2 10*3/uL (ref 1.4–6.5)
Neutrophil %: 57.2 %
PLATELETS: 237 10*3/uL (ref 150–440)
RBC: 3.63 10*6/uL — ABNORMAL LOW (ref 3.80–5.20)
RDW: 13.8 % (ref 11.5–14.5)
WBC: 9 10*3/uL (ref 3.6–11.0)

## 2014-01-05 LAB — BASIC METABOLIC PANEL
ANION GAP: 2 — AB (ref 7–16)
BUN: 4 mg/dL — ABNORMAL LOW (ref 7–18)
CALCIUM: 8.9 mg/dL (ref 8.5–10.1)
CHLORIDE: 100 mmol/L (ref 98–107)
Co2: 33 mmol/L — ABNORMAL HIGH (ref 21–32)
Creatinine: 0.81 mg/dL (ref 0.60–1.30)
GLUCOSE: 154 mg/dL — AB (ref 65–99)
Osmolality: 270 (ref 275–301)
Potassium: 4.1 mmol/L (ref 3.5–5.1)
Sodium: 135 mmol/L — ABNORMAL LOW (ref 136–145)

## 2014-01-09 LAB — CREATININE, SERUM
Creatinine: 0.74 mg/dL (ref 0.60–1.30)
EGFR (African American): >60
EGFR (Non-African Amer.): >60

## 2014-01-19 ENCOUNTER — Other Ambulatory Visit: Payer: Commercial Managed Care - PPO | Admitting: Family Medicine

## 2014-01-25 ENCOUNTER — Other Ambulatory Visit: Payer: Commercial Managed Care - PPO

## 2014-01-25 ENCOUNTER — Other Ambulatory Visit: Payer: Commercial Managed Care - PPO | Admitting: Family Medicine

## 2014-03-22 ENCOUNTER — Other Ambulatory Visit: Payer: Self-pay | Admitting: Family Medicine

## 2014-03-22 DIAGNOSIS — R7989 Other specified abnormal findings of blood chemistry: Secondary | ICD-10-CM

## 2014-03-23 ENCOUNTER — Other Ambulatory Visit (INDEPENDENT_AMBULATORY_CARE_PROVIDER_SITE_OTHER): Payer: Commercial Managed Care - PPO

## 2014-03-23 DIAGNOSIS — R946 Abnormal results of thyroid function studies: Secondary | ICD-10-CM

## 2014-03-23 DIAGNOSIS — R7989 Other specified abnormal findings of blood chemistry: Secondary | ICD-10-CM

## 2014-03-23 LAB — T4, FREE: Free T4: 0.84 ng/dL (ref 0.60–1.60)

## 2014-03-23 LAB — TSH: TSH: 0.25 u[IU]/mL — AB (ref 0.35–5.50)

## 2014-03-27 ENCOUNTER — Other Ambulatory Visit: Payer: Self-pay | Admitting: Family Medicine

## 2014-03-27 DIAGNOSIS — R946 Abnormal results of thyroid function studies: Secondary | ICD-10-CM

## 2014-04-20 ENCOUNTER — Ambulatory Visit (INDEPENDENT_AMBULATORY_CARE_PROVIDER_SITE_OTHER): Payer: Commercial Managed Care - PPO | Admitting: Family Medicine

## 2014-04-20 ENCOUNTER — Encounter: Payer: Self-pay | Admitting: Family Medicine

## 2014-04-20 VITALS — BP 120/76 | HR 81 | Temp 97.9°F | Ht 65.0 in | Wt 221.0 lb

## 2014-04-20 DIAGNOSIS — Z0001 Encounter for general adult medical examination with abnormal findings: Secondary | ICD-10-CM | POA: Insufficient documentation

## 2014-04-20 DIAGNOSIS — Z0000 Encounter for general adult medical examination without abnormal findings: Secondary | ICD-10-CM

## 2014-04-20 DIAGNOSIS — Z1231 Encounter for screening mammogram for malignant neoplasm of breast: Secondary | ICD-10-CM

## 2014-04-20 DIAGNOSIS — Z136 Encounter for screening for cardiovascular disorders: Secondary | ICD-10-CM

## 2014-04-20 DIAGNOSIS — R7989 Other specified abnormal findings of blood chemistry: Secondary | ICD-10-CM

## 2014-04-20 DIAGNOSIS — R946 Abnormal results of thyroid function studies: Secondary | ICD-10-CM

## 2014-04-20 DIAGNOSIS — K5732 Diverticulitis of large intestine without perforation or abscess without bleeding: Secondary | ICD-10-CM

## 2014-04-20 LAB — LIPID PANEL
Cholesterol: 197 mg/dL (ref 0–200)
HDL: 41 mg/dL (ref >39.00)
LDL Cholesterol: 128 mg/dL — ABNORMAL HIGH (ref 0–99)
Total CHOL/HDL Ratio: 5
Triglycerides: 141 mg/dL (ref 0.0–149.0)
VLDL: 28.2 mg/dL (ref 0.0–40.0)

## 2014-04-20 LAB — COMPREHENSIVE METABOLIC PANEL
ALBUMIN: 3.9 g/dL (ref 3.5–5.2)
ALT: 38 U/L — ABNORMAL HIGH (ref 0–35)
AST: 45 U/L — ABNORMAL HIGH (ref 0–37)
Alkaline Phosphatase: 86 U/L (ref 39–117)
BUN: 12 mg/dL (ref 6–23)
CALCIUM: 9.5 mg/dL (ref 8.4–10.5)
CO2: 29 mEq/L (ref 19–32)
Chloride: 106 mEq/L (ref 96–112)
Creatinine, Ser: 0.7 mg/dL (ref 0.4–1.2)
GFR: 87.68 mL/min (ref >60.00)
GLUCOSE: 131 mg/dL — AB (ref 70–99)
POTASSIUM: 4.2 meq/L (ref 3.5–5.1)
Sodium: 139 mEq/L (ref 135–145)
Total Bilirubin: 0.5 mg/dL (ref 0.2–1.2)
Total Protein: 7.6 g/dL (ref 6.0–8.3)

## 2014-04-20 LAB — T4, FREE: Free T4: 0.75 ng/dL (ref 0.60–1.60)

## 2014-04-20 LAB — VITAMIN B12: Vitamin B-12: 484 pg/mL (ref 211–911)

## 2014-04-20 NOTE — Assessment & Plan Note (Signed)
Reviewed preventive care protocols, scheduled due services, and updated immunizations Discussed nutrition, exercise, diet, and healthy lifestyle.  Orders Placed This Encounter  Procedures  . MM Digital Screening  . T4, Free  . TSH  . Comprehensive metabolic panel  . CBC with Differential  . Lipid panel  . Vitamin B12  . Ambulatory referral to Endocrinology

## 2014-04-20 NOTE — Patient Instructions (Signed)
Good to see you. Please go to front desk and let them know you need to set up a referral or they will call if you if this is not an urgent referral.  Either MARION or LINDA will help you set it up.    Please call to set up your mammogram.  I will call you with your lab results.

## 2014-04-20 NOTE — Assessment & Plan Note (Signed)
Recheck labs, refer to [facility] endocrinology.

## 2014-04-20 NOTE — Progress Notes (Signed)
Subjective:    Patient ID: Rebecca Macias, female    DOB: 1961-01-21, 53 y.o.   MRN: [id number]  HPI  53 yo very pleasant G6P5 with here for CPX.  Pap smear 09/01/12 (done by me).  LMP in 01/2013- no post menopausal bleeding.   No h/o abnormal pap smears, no family h/o breast, uterine or ovarian CA. Due for mammogram.  Colonoscopy 11/09/12 (Dr. Olevia Perches)- 10 year recall. Did have to have portion of colon removed in 12/2013 due to diverticulitis with abscess/perforation.  Bowel have been ok- no blood in her stool, no abdominal pain.  Low TSH- I referred her to endocrinology in 03/2014.  Appt not until end of August, she would like to be referred to someone within [facility] system.  + dry skin, hair loss. Has lost weight but she has been trying to lose weight.  Wants to lose more.  Wt Readings from Last 3 Encounters:  04/20/14 221 lb (100.245 kg)  12/29/13 221 lb 8 oz (100.472 kg)  12/16/13 226 lb (102.513 kg)     Lab Results  Component Value Date   TSH 0.25* 03/23/2014   Wt Readings from Last 3 Encounters:  04/20/14 221 lb (100.245 kg)  12/29/13 221 lb 8 oz (100.472 kg)  12/16/13 226 lb (102.513 kg)    Lab Results  Component Value Date   CHOL 218* 12/16/2012   HDL 39.00* 12/16/2012   LDLDIRECT 144.9 12/16/2012   TRIG 200.0* 12/16/2012   CHOLHDL 6 12/16/2012     Patient Active Problem List   Diagnosis Date Noted  . Routine general medical examination at a health care facility 04/20/2014  . Low TSH level 12/16/2013  . Obesity (BMI 35.0-39.9 without comorbidity) 09/01/2012  . Sigmoid diverticulitis   . Ovarian cyst    Past Medical History  Diagnosis Date  . Sigmoid diverticulitis 08/2012  . Ovarian cyst   . H/O gestational diabetes mellitus, not currently pregnant    Past Surgical History  Procedure Laterality Date  . Tubal ligation    . Bladder suspension  2009   History  Substance Use Topics  . Smoking status: Former Smoker    Quit date: 09/19/2011  . Smokeless tobacco:  Never Used  . Alcohol Use: No   Family History  Problem Relation Age of Onset  . Heart disease Father   . Kidney disease Father   . Diabetes Father   . Diabetes Maternal Grandmother   . Diabetes Paternal Grandmother    No Known Allergies No current outpatient prescriptions on file prior to visit.   No current facility-administered medications on file prior to visit.   The PMH, PSH, Social History, Family History, Medications, and allergies have been reviewed in [facility], and have been updated if relevant.   Review of Systems See HPI Patient reports no  vision/ hearing changes,anorexia, weight change, fever ,adenopathy, persistant / recurrent hoarseness, swallowing issues, chest pain, edema,persistant / recurrent cough, hemoptysis, dyspnea(rest, exertional, paroxysmal nocturnal), gastrointestinal  bleeding (melena, rectal bleeding), abdominal pain, excessive heart burn, GU symptoms(dysuria, hematuria, pyuria, voiding/incontinence  Issues) syncope, focal weakness, severe memory loss, concerning skin lesions, depression, anxiety, abnormal bruising/bleeding, major joint swelling, breast masses     Objective:   Physical Exam BP 120/76  Pulse 81  Temp(Src) 97.9 F (36.6 C) (Oral)  Ht 5\' 5"  (1.651 m)  Wt 221 lb (100.245 kg)  BMI 36.78 kg/m2  SpO2 97%  LMP 11/03/2012  General:  Obese, pleasant female,well-nourished,in no acute distress; alert,appropriate and cooperative throughout examination Head:  normocephalic and atraumatic.   Eyes:  vision grossly intact, pupils equal, pupils round, and pupils reactive to light.   Ears:  R ear normal and L ear normal.   Nose:  no external deformity.   Mouth:  good dentition.   Neck:  No deformities, masses, or tenderness noted. Breasts:  No mass, nodules, thickening, tenderness, bulging, retraction, inflamation, nipple discharge  Bilateral raised lesions on nipples bilaterally- per pt have been there for as long as she can remember Lungs:  Normal  respiratory effort, chest expands symmetrically. Lungs are clear to auscultation, no crackles or wheezes. Heart:  Normal rate and regular rhythm. S1 and S2 normal without gallop, murmur, click, rub or other extra sounds. Abdomen:  Bowel sounds positive,abdomen soft and non-tender without masses, organomegaly or hernias noted. Old healed vertical surgical scar Msk:  No deformity or scoliosis noted of thoracic or lumbar spine.   Extremities:  No clubbing, cyanosis, edema, or deformity noted with normal full range of motion of all joints.   Neurologic:  alert & oriented X3 and gait normal.   Skin:  Intact without suspicious lesions or rashes Cervical Nodes:  No lymphadenopathy noted Axillary Nodes:  No palpable lymphadenopathy Psych:  Cognition and judgment appear intact. Alert and cooperative with normal attention span and concentration. No apparent delusions, illusions, hallucinations    Assessment & Plan:

## 2014-04-20 NOTE — Progress Notes (Signed)
Pre visit review using our clinic review tool, if applicable. No additional management support is needed unless otherwise documented below in the visit note. 

## 2014-05-05 ENCOUNTER — Encounter: Payer: Self-pay | Admitting: Internal Medicine

## 2014-05-05 ENCOUNTER — Ambulatory Visit (INDEPENDENT_AMBULATORY_CARE_PROVIDER_SITE_OTHER): Payer: Commercial Managed Care - PPO | Admitting: Internal Medicine

## 2014-05-05 VITALS — BP 122/88 | HR 112 | Temp 98.3°F | Resp 12 | Ht 65.0 in | Wt 218.0 lb

## 2014-05-05 DIAGNOSIS — E041 Nontoxic single thyroid nodule: Secondary | ICD-10-CM

## 2014-05-05 DIAGNOSIS — E0590 Thyrotoxicosis, unspecified without thyrotoxic crisis or storm: Secondary | ICD-10-CM

## 2014-05-05 LAB — T4, FREE: FREE T4: 0.78 ng/dL (ref 0.60–1.60)

## 2014-05-05 LAB — T3, FREE: T3, Free: 3.3 pg/mL (ref 2.3–4.2)

## 2014-05-05 LAB — TSH: TSH: 0.17 u[IU]/mL — ABNORMAL LOW (ref 0.35–4.50)

## 2014-05-05 MED ORDER — ATENOLOL 25 MG PO TABS: 25.0000 mg | ORAL_TABLET | Freq: Every day | ORAL | Status: DC

## 2014-05-05 NOTE — Patient Instructions (Signed)
Please start Atenolol 25 mg daily at bedtime to help with your heart rate.  Please stop at the lab. I will let you know the results through [facility]. Will see if we need to check a thyroid Uptake and Scan depending on the results.  Please return in 3 months.

## 2014-05-05 NOTE — Progress Notes (Addendum)
Patient ID: Rebecca Macias, female   DOB: 1961-06-10, 53 y.o.   MRN: [id number]   HPI  Rebecca Macias is a 53 y.o.-year-old female, referred by her PCP, Dr Deborra Medina, for consultation regarding etiology of subclinical hyperthyroidism.  Pt had an episode of palpitations in 12/2013 >> EKG normal >> TSH low (a repeat set of thyroid labs confirmed a low TSH).   I reviewed pt's thyroid tests: Lab Results  Component Value Date   TSH 0.25* 03/23/2014   TSH 0.14* 12/16/2013   FREET4 0.75 04/20/2014   FREET4 0.84 03/23/2014   FREET4 0.79 12/16/2013     Pt denies feeling nodules in neck, hoarseness, dysphagia/odynophagia, SOB with lying down; she c/o: - + hair loss - + dry skin - + excessive sweating/heat intolerance - + joint pain - + constipation - no more palpitations - no tremors - no increased anxiety - + insomnia - + fatigue - no weight loss (+ weight gain) - + HA occasionally - no menses in 1 year  Pt does have a FH of thyroid ds: Graves ds in half-sister. No FH of thyroid cancer. No h/o radiation tx to head or neck.  No seaweed or kelp, no recent contrast studies. No steroid use. No herbal supplements.   I reviewed her chart and she also has a history of diverculitis, B12 def.   ROS: Constitutional: see HPI, + excessive urination Eyes: + blurry vision, no xerophthalmia ENT: + sore throat, no nodules palpated in throat, no dysphagia/odynophagia, no hoarseness, + tinnitus Cardiovascular: no CP/+SOB/+palpitations/+leg swelling Respiratory: no cough/+SOB/+ wheezing Gastrointestinal: + N/no V/D/+C Musculoskeletal: no muscle/joint aches Skin: + rash, + itching, + hair loss Neurological: no tremors/numbness/tingling/dizziness, + HA Psychiatric: no depression/anxiety  Past Medical History  Diagnosis Date  . Sigmoid diverticulitis 08/2012  . Ovarian cyst   . H/O gestational diabetes mellitus, not currently pregnant    Past Surgical History  Procedure Laterality Date  . Tubal ligation     . Bladder suspension  2009   History   Social History  . Marital Status: Divorced    Spouse Name: N/A    Number of Children: 5   Occupational History  . 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 Smoking status: Former Smoker    Quit date: 09/19/2011  . Smokeless tobacco: Never Used  . Alcohol Use: Wine, rarely  . Drug Use: No   Social History Narrative   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   No current outpatient prescriptions on file prior to visit.   No current facility-administered medications on file prior to visit.   Allergies  Allergen Reactions  . Flagyl [Metronidazole] Itching   Family History  Problem Relation Age of Onset  . Heart disease Father   . Kidney disease Father   . Diabetes Father   . Diabetes Maternal Grandmother   . Diabetes Paternal Grandmother    PE: BP 122/88  Pulse 112  Temp(Src) 98.3 F (36.8 C) (Oral)  Resp 12  Ht 5\' 5"  (1.651 m)  Wt 218 lb (98.884 kg)  BMI 36.28 kg/m2  SpO2 97%  LMP 11/03/2012 Wt Readings from Last 3 Encounters:  05/05/14 218 lb (98.884 kg)  04/20/14 221 lb (100.245 kg)  12/29/13 221 lb 8 oz (100.472 kg)   Constitutional: obese, in NAD Eyes: PERRLA, EOMI, no exophthalmos, no lid lag, no stare ENT: moist mucous membranes, no thyromegaly, no thyroid bruits, no cervical  lymphadenopathy Cardiovascular: RRR, No MRG Respiratory: CTA B Gastrointestinal: abdomen soft, NT, ND, BS+ Musculoskeletal: no deformities, strength intact in all 4 Skin: moist, warm, no rashes Neurological: very fine tremor with outstretched hands, DTR normal in all 4  ASSESSMENT: 1. Subclinical hyperthyroidism  2. Menopause  3. Vit D def.  PLAN:  1. Patient with 2 x low TSH levels , with some thyrotoxic sxs: heat intolerance, one episode of palpitations. Some of her sxs can be explained by her menopause or the B12 def. - she does not appear to have exogenous causes  for the low TSH.  - We discussed that possible causes of thyrotoxicosis are:  Graves ds   Thyroiditis toxic multinodular goiter/ toxic adenoma (I cannot feel nodules at palpation of his thyroid). - I suggested that we check the TSH, fT3 and fT4 today - If the tests remain abnormal I will obtain an uptake and scan to differentiate between the 3 above possible etiologies  - we discussed about possible modalities of treatment for the above conditions, to include methimazole use or  radioactive iodine ablation. - I did advice her that we might need to do thyroid ultrasound depending on the results of the uptake and scan (if a cold nodule is present) - I will start a beta blocker (Atenolol 25 mg daily), since she is tachycardic (pulse 104 on repeat) - I advised her to join my chart to communicate easier - RTC in 3 months, but likely sooner for repeat labs  Office Visit on 05/05/2014  Component Date Value Ref Range Status  . TSH 05/05/2014 0.17* 0.35 - 4.50 uIU/mL Final  . Free T4 05/05/2014 0.78  0.60 - 1.60 ng/dL Final  . T3, Free 05/05/2014 3.3  2.3 - 4.2 pg/mL Final   TSH lower than before >> will order the Uptake and scan.  CLINICAL DATA: Sub clinical hyperthyroidism appear TSH equal 0.17  EXAM: THYROID SCAN AND UPTAKE - 24 HOURS  TECHNIQUE: Following the per oral administration of I-131 sodium iodide, the patient returned at 24 hours and uptake measurements were acquired with the uptake probe centered on the neck. Thyroid imaging was performed following the intravenous administration of the Tc-77m Pertechnetate.  RADIOPHARMACEUTICALS: 6.9 microCuries I-131 Sodium Iodide and 10.2 mCi TC-56m Pertechnetate  FINDINGS: There is a focus of in relative increased uptake within the lower pole the right lobe of thyroid gland which has an ovoid shape. There is a oblong region of photopenia in the lower pole of the left lobe of thyroid gland. Thyroid gland appears normal volume.  24  hour I 131 uptake = 18% (normal 10-30%)  IMPRESSION: 1. Relative increased uptake within a right lower pole nodule in the setting of depressed TSH is concerning for an autonomous nodule. 2. Photopenia in the lower pole of the left lobe suggests a cold nodule. 3. Consider thyroid ultrasound prior to consideration of I 131 therapy for hyperthyroidism. 4. 24 hour I 131 uptake = 18% (normal 10-30%)   Electronically Signed By: Suzy Bouchard M.D. On: 06/21/2014 14:19     Will need a thyroid U/S to further characterize the cold nodule.  CLINICAL DATA: Hyperthyroidism with relatively cold nodular defect  in the mid to inferior left lobe and hot nodule in the inferior  right lobe by nuclear medicine scintigraphy.  EXAM:  THYROID ULTRASOUND  TECHNIQUE:  Ultrasound examination of the thyroid gland and adjacent soft  tissues was performed.  COMPARISON: Nuclear medicine scan dated 06/21/2014  FINDINGS:   Right thyroid lobe  Measurements: 6.3 x 1.9 x 2.2 cm. Well-circumscribed, solid mid right thyroid nodule measures approximately 1.6 x 0.8 x 2.1 cm. This is a noncalcified nodule. Tiny cystic areas are present in the inferior right lobe measuring 0.3 and 0.5 cm.   Left thyroid lobe Measurements: 5.8 x 2.1 x 2.1 cm. Ovoid nodule in the mid left lobe measures approximately 1.5 x 1.1 x 1.9 cm. This likely corresponds to the nuclear medicine abnormality. Mildly complex cyst in the superior left lobe measures 0.9 cm. Small cysts in the inferior left lobe measures 0.3 cm.   Isthmus Thickness: 0.4 cm. No nodules visualized.   Lymphadenopathy None visualized.  IMPRESSION:  Bilateral dominant thyroid nodules. Right thyroid nodule measures  approximately 1.6 x 0.8 x 2.1 cm and is located in the midportion of  the right lobe. This nodule could be watched or sampled based on  size criteria. Nodule in the mid left lobe measures 1.5 x 1.1 x 1.9  cm and likely corresponds to the cold nuclear medicine  defect. There  would be an indication to biopsy this nodule based on size criteria  and nuclear medicine findings.  Findings meet consensus criteria for biopsy. Ultrasound-guided fine  needle aspiration should be considered, as per the consensus  statement: Management of Thyroid Nodules Detected at [place]: Society of  Radiologists in [place]. Radiology  2005; [id number].   Electronically Signed  By: Aletta Edouard M.D.  On: 06/30/2014 14:39   Since the R-sided nodule is "hot", it is very low risk. We will try to Bx the cold, L-sided nodule.

## 2014-05-12 ENCOUNTER — Encounter: Payer: Self-pay | Admitting: *Deleted

## 2014-05-12 ENCOUNTER — Telehealth: Payer: Self-pay | Admitting: *Deleted

## 2014-05-12 NOTE — Telephone Encounter (Signed)
Uptake and scan scheduled for June 22nd and 23rd, 12:45 pm at [facility] pt and lvm with the dates and times. Advised pt to call with any questions. Be advised.

## 2014-05-29 ENCOUNTER — Ambulatory Visit (HOSPITAL_COMMUNITY): Payer: Commercial Managed Care - PPO

## 2014-05-29 ENCOUNTER — Telehealth: Payer: Self-pay | Admitting: Internal Medicine

## 2014-05-29 NOTE — Telephone Encounter (Signed)
Rebecca Macias was waiting on pt to call for a date to rs the uptake testing. In July 9 and 10 or 13, 14, 15 or 27, 28, 29. Please call pt back once scheduled

## 2014-05-30 ENCOUNTER — Encounter (HOSPITAL_COMMUNITY): Payer: Commercial Managed Care - PPO

## 2014-06-01 ENCOUNTER — Ambulatory Visit: Payer: Self-pay | Admitting: Family Medicine

## 2014-06-01 LAB — HM MAMMOGRAPHY

## 2014-06-01 NOTE — Telephone Encounter (Signed)
Called and re-scheduled pt's Uptake and scan. July 14th & 15th at 1:00 pm both days, [facility]/Nuclear Medicine. Called pt and lvm advising her of the appt dates and times in detail. NPO after midnight on the 14th. Advised pt if she had any further questions to please call our office.

## 2014-06-06 ENCOUNTER — Encounter: Payer: Self-pay | Admitting: Family Medicine

## 2014-06-20 ENCOUNTER — Encounter (HOSPITAL_COMMUNITY)
Admission: RE | Admit: 2014-06-20 | Discharge: 2014-06-20 | Disposition: A | Discharge status: Home / Self Care | Payer: Commercial Managed Care - PPO | Source: Ambulatory Visit | Attending: Internal Medicine | Admitting: Internal Medicine

## 2014-06-20 DIAGNOSIS — E0590 Thyrotoxicosis, unspecified without thyrotoxic crisis or storm: Secondary | ICD-10-CM | POA: Insufficient documentation

## 2014-06-21 ENCOUNTER — Encounter (HOSPITAL_COMMUNITY)
Admission: RE | Admit: 2014-06-21 | Discharge: 2014-06-21 | Disposition: A | Discharge status: Home / Self Care | Payer: Commercial Managed Care - PPO | Source: Ambulatory Visit | Attending: Internal Medicine | Admitting: Internal Medicine

## 2014-06-21 MED ORDER — SODIUM PERTECHNETATE TC 99M INJECTION
10.2000 | Freq: Once | INTRAVENOUS | Status: AC | PRN
  Administered 2014-06-21: 10.2 via INTRAVENOUS

## 2014-06-21 MED ORDER — SODIUM IODIDE I 131 CAPSULE
6.9000 | Freq: Once | INTRAVENOUS | Status: AC | PRN
  Administered 2014-06-21: 6.9 via ORAL

## 2014-06-26 ENCOUNTER — Telehealth: Payer: Self-pay | Admitting: Internal Medicine

## 2014-06-26 ENCOUNTER — Other Ambulatory Visit: Payer: Self-pay | Admitting: Internal Medicine

## 2014-06-26 DIAGNOSIS — E041 Nontoxic single thyroid nodule: Secondary | ICD-10-CM

## 2014-06-26 NOTE — Telephone Encounter (Signed)
Yes, I ordered it in [place] >> she will be called with the schedule.

## 2014-06-26 NOTE — Telephone Encounter (Signed)
Patient states Dr. Cruzita Lederer advised her to call and schedule her U/S  Thank You

## 2014-06-26 NOTE — Telephone Encounter (Signed)
Called and left message with pt's daughter per Dr Arman Filter note. She understood and said she would let her mother know.

## 2014-06-26 NOTE — Addendum Note (Signed)
Addended by: Philemon Kingdom on: 06/26/2014 12:02 PM   Modules accepted: Orders

## 2014-06-26 NOTE — Telephone Encounter (Signed)
Please read note below and advise. Thank you.  

## 2014-06-30 ENCOUNTER — Ambulatory Visit
Admission: RE | Admit: 2014-06-30 | Discharge: 2014-06-30 | Disposition: A | Discharge status: Home / Self Care | Payer: Commercial Managed Care - PPO | Source: Ambulatory Visit | Attending: Internal Medicine | Admitting: Internal Medicine

## 2014-08-02 ENCOUNTER — Encounter: Payer: Self-pay | Admitting: Internal Medicine

## 2014-08-02 ENCOUNTER — Ambulatory Visit (INDEPENDENT_AMBULATORY_CARE_PROVIDER_SITE_OTHER): Payer: Commercial Managed Care - PPO | Admitting: Internal Medicine

## 2014-08-02 ENCOUNTER — Other Ambulatory Visit: Payer: Self-pay | Admitting: Internal Medicine

## 2014-08-02 VITALS — BP 112/68 | HR 91 | Temp 98.7°F | Resp 12 | Wt 217.0 lb

## 2014-08-02 DIAGNOSIS — R7989 Other specified abnormal findings of blood chemistry: Secondary | ICD-10-CM

## 2014-08-02 DIAGNOSIS — R946 Abnormal results of thyroid function studies: Secondary | ICD-10-CM

## 2014-08-02 LAB — T4, FREE: FREE T4: 0.81 ng/dL (ref 0.60–1.60)

## 2014-08-02 LAB — T3, FREE: T3, Free: 3 pg/mL (ref 2.3–4.2)

## 2014-08-02 LAB — TSH: TSH: 0.42 u[IU]/mL (ref 0.35–4.50)

## 2014-08-02 NOTE — Patient Instructions (Signed)
Please stop at the lab.  You will be called with the appointment for the thyroid biopsy.  Please come back in 3 mo for labs and in 6 mo for an appt.

## 2014-08-02 NOTE — Progress Notes (Addendum)
Patient ID: Rebecca Macias, female   DOB: 1961/10/31, 53 y.o.   MRN: [id number]   HPI  Rebecca Macias is a 53 y.o.-year-old female, returning for f/u for subclinical hyperthyroidism, due to toxic adenoma (right).  Pt had an episode of palpitations in 12/2013 >> EKG normal >> TSH low (a repeat set of thyroid labs confirmed a low TSH).   I reviewed pt's thyroid tests: Lab Results  Component Value Date   TSH 0.17* 05/05/2014   TSH 0.25* 03/23/2014   TSH 0.14* 12/16/2013   FREET4 0.78 05/05/2014   FREET4 0.75 04/20/2014   FREET4 0.84 03/23/2014   FREET4 0.79 12/16/2013    At last visit, we checked an Uptake an Scan: 2 defects: one R (hot) and one L (cold).  A thyroid U/S showed several small cystic spaces and 2 nodules of similar sizes. The one corresponding to the cold defect was: Ovoid nodule in the mid left lobe measures approximately 1.5 x 1.1 x 1.9 cm.  I suggested that we Bx this nodule but she did not decide yet.  Pt denies feeling nodules in neck, hoarseness, dysphagia/odynophagia, SOB with lying down; she c/o: - + fatigue - improved palpitations - + hair loss - + excessive sweating/heat intolerance - persisting - + mm/joint pain - + constipation - no tremors - no increased anxiety  I reviewed pt's medications, allergies, PMH, social hx, family hx and no changes required, except as mentioned above.  ROS: Constitutional: see HPI Eyes: + blurry vision, no xerophthalmia ENT: + sore throat, no nodules palpated in throat, no dysphagia/odynophagia, no hoarseness, + tinnitus Cardiovascular: no CP/+SOB/rarely palpitations/no leg swelling Respiratory: no cough/+SOB/+ wheezing Gastrointestinal: no N/V/D/+C Musculoskeletal: + both: muscle/joint aches Skin: no rash, + hair loss Neurological: no tremors/numbness/tingling/dizziness, + HA  PE: BP 112/68  Pulse 91  Temp(Src) 98.7 F (37.1 C) (Oral)  Resp 12  Wt 217 lb (98.431 kg)  SpO2 95%  LMP 11/03/2012 Wt Readings from Last 3  Encounters:  08/02/14 217 lb (98.431 kg)  05/05/14 218 lb (98.884 kg)  04/20/14 221 lb (100.245 kg)   Constitutional: obese, in NAD Eyes: PERRLA, EOMI, no exophthalmos, no lid lag, no stare ENT: moist mucous membranes, no thyromegaly, no thyroid bruits, no cervical lymphadenopathy Cardiovascular: RRR, No MRG Respiratory: CTA B Gastrointestinal: abdomen soft, NT, ND, BS+ Musculoskeletal: no deformities, strength intact in all 4 Skin: moist, warm, no rashes Neurological: very fine tremor with outstretched hands, DTR normal in all 4  ASSESSMENT: 1. 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 Relative increased uptake within a right lower pole nodule in thesetting of depressed TSH is concerning for an autonomous nodule. 2. Photopenia in the lower pole of the left lobe suggests a cold nodule. 3. Consider thyroid ultrasound prior to consideration of I 131 therapy for hyperthyroidism. 4. 24 hour I 131 uptake = 18% (normal 10-30%) - 06/30/2014: Thyroid U/S:  Right thyroid lobe Measurements: 6.3 x 1.9 x 2.2 cm. Well-circumscribed, solid mid right thyroid nodule measures approximately 1.6 x 0.8 x 2.1 cm. This is a noncalcified nodule. Tiny cystic areas are present in the inferior right lobe measuring 0.3 and 0.5 cm.   Left thyroid lobe Measurements: 5.8 x 2.1 x 2.1 cm. Ovoid nodule in the mid left lobe measures approximately 1.5 x 1.1 x 1.9 cm. This likely corresponds to the nuclear medicine abnormality. Mildly complex cyst in the superior left lobe measures 0.9 cm. Small cysts in the inferior left lobe measures 0.3 cm.  Isthmus Thickness: 0.4 cm. No nodules visualized.   Lymphadenopathy None visualized.  IMPRESSION:  Bilateral dominant thyroid nodules. Right thyroid nodule measures approximately 1.6 x 0.8 x 2.1 cm and is located in the midportion of  the right lobe. This nodule could be watched or sampled based on size criteria. Nodule in the mid left lobe measures 1.5 x 1.1 x  1.9  cm and likely corresponds to the cold nuclear medicine defect. There would be an indication to biopsy this nodule based on size criteria and nuclear medicine findings.    2. Cold L thyroid nodule  PLAN:  1. And 2. R toxic adenoma and cold L thyroid nodule Patient with subclinical hyperthyroidism, with h/o thyrotoxic sxs: heat intolerance, one episode of palpitations, now improved on the beta blocker   - during investigation for her subclinical hyperthyroidism, we found that she has a R sided toxic adenoma and a cold L sided thyroid nodule - for the R toxic nodule, I suggested that we check the TSH, fT3 and fT4 today, and only intervene if the TFTs worsen. We discussed that the definitive tx is RAI tx, but the risk is hypothyroidism, so will try to postpone this if possible. She agrees. - for the L cold nodule, we will need a Bx >> ordered  - at last visit, we started a beta blocker (Atenolol 25 mg daily). She still has some palpitations during the day >> advised her to move it in am as she now takes it at bedtime. Return in about 6 months (around 02/02/2015).   Office Visit on 08/02/2014  Component Date Value Ref Range Status  . TSH 08/02/2014 0.42  0.35 - 4.50 uIU/mL Final  . T3, Free 08/02/2014 3.0  2.3 - 4.2 pg/mL Final  . Free T4 08/02/2014 0.81  0.60 - 1.60 ng/dL Final   TFTs now normal. For now, continue Atenolol. Will await thyroid nodule bx result.  Adequacy Reason Satisfactory For Evaluation. Diagnosis THYROID, FINE NEEDLE ASPIRATION LMP (SPECIMEN 1 OF 1, COLLECTED ON 06/13/14): BENIGN. FINDINGS CONSISTENT WITH A BENIGN FOLLICULAR NODULE. Enid Cutter MD Pathologist, Electronic Signature (Case signed 09/14/2014) Specimen Clinical Information Hyperthyroidism with relatively cold nodular defect in the mid to inferior left lobe and hot nodule in the inferior right lobe by nuclear medicine scintigraphy, Ovoid nodule lmp 1.5 x 1.1 x 1.9cm - this likely corresponds to  nm abnormality Source Thyroid, Fine Needle Aspiration, LMP, (Specimen 1 of 1, collected on 09/13/2014)  Thyroid Bx normal. Will inform pt.

## 2014-08-04 ENCOUNTER — Ambulatory Visit: Payer: Commercial Managed Care - PPO | Admitting: Internal Medicine

## 2014-08-12 ENCOUNTER — Emergency Department: Payer: Self-pay | Admitting: Internal Medicine

## 2014-09-13 ENCOUNTER — Other Ambulatory Visit (HOSPITAL_COMMUNITY)
Admission: RE | Admit: 2014-09-13 | Discharge: 2014-09-13 | Disposition: A | Discharge status: Home / Self Care | Payer: Commercial Managed Care - PPO | Source: Ambulatory Visit | Attending: Interventional Radiology | Admitting: Interventional Radiology

## 2014-09-13 ENCOUNTER — Ambulatory Visit
Admission: RE | Admit: 2014-09-13 | Discharge: 2014-09-13 | Disposition: A | Discharge status: Home / Self Care | Payer: Commercial Managed Care - PPO | Source: Ambulatory Visit | Attending: Internal Medicine | Admitting: Internal Medicine

## 2014-09-13 DIAGNOSIS — E041 Nontoxic single thyroid nodule: Secondary | ICD-10-CM | POA: Insufficient documentation

## 2014-11-05 IMAGING — US US THYROID BIOPSY
1 series · 13 of 14 positions shown · non-contrast
Comparison: Prior thyroid ultrasound 06/30/2014;

CLINICAL DATA: 53-year-old female with a cold 1.9 cm solid
hypoechoic nodule in the mid aspect of the left thyroid gland which
meet consensus criteria for ultrasound-guided FNA biopsy.

EXAM:
ULTRASOUND GUIDED NEEDLE ASPIRATE BIOPSY OF THE THYROID GLAND

[Series 1: us thyroid biopsy · 0.07mm/px · 14 acquisitions, 13 frames shown]
[im 1/14]
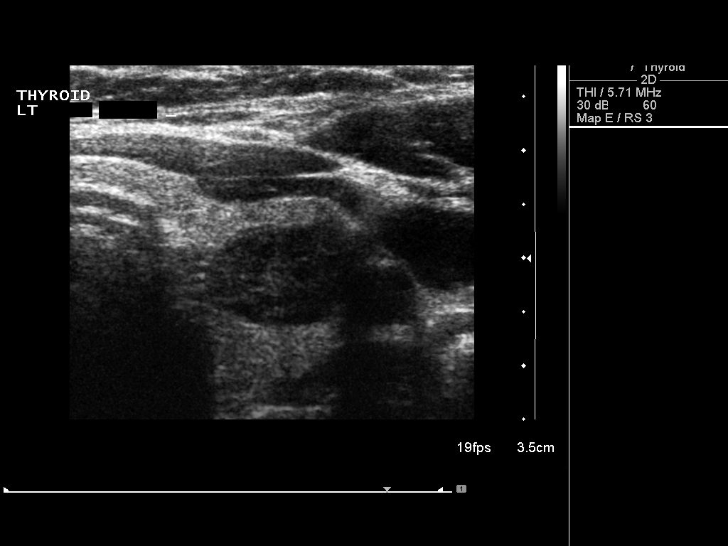
[im 2/14]
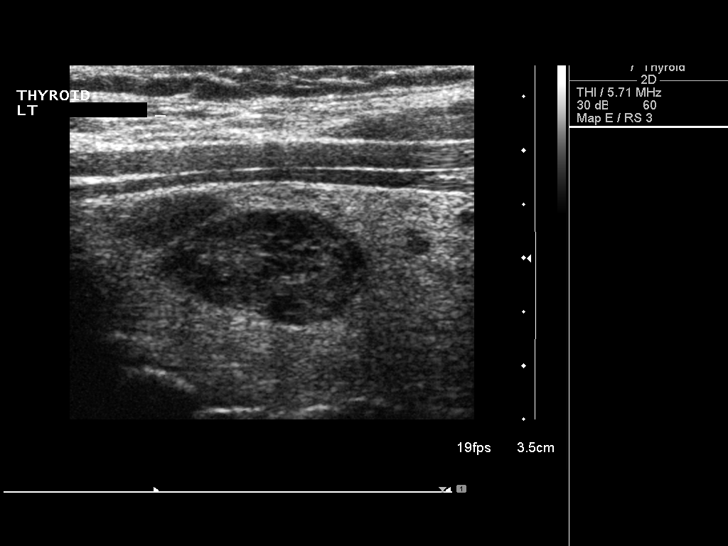
[im 3/14]
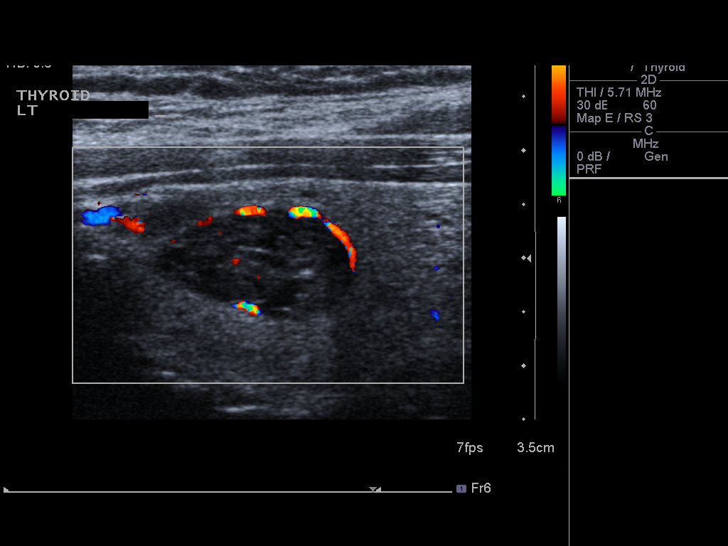
[im 4/14]
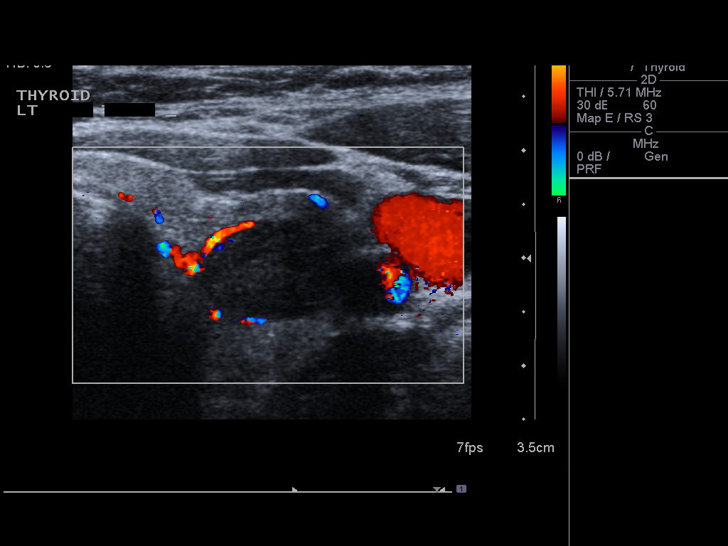
[im 5/14]
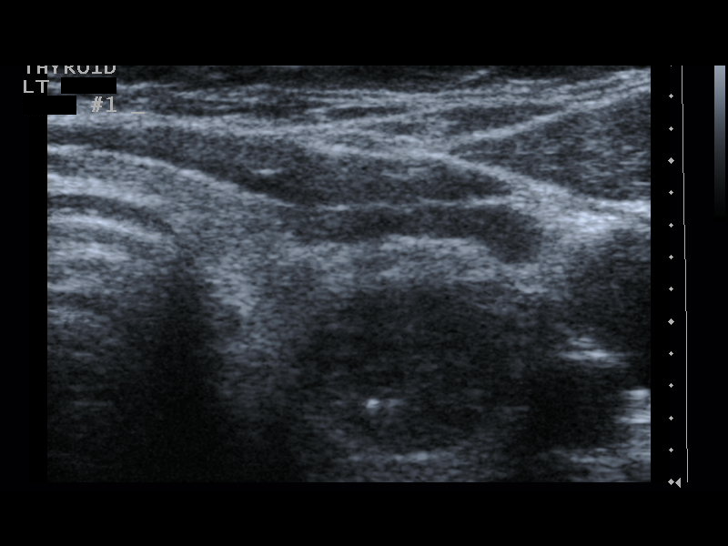
[im 6/14]
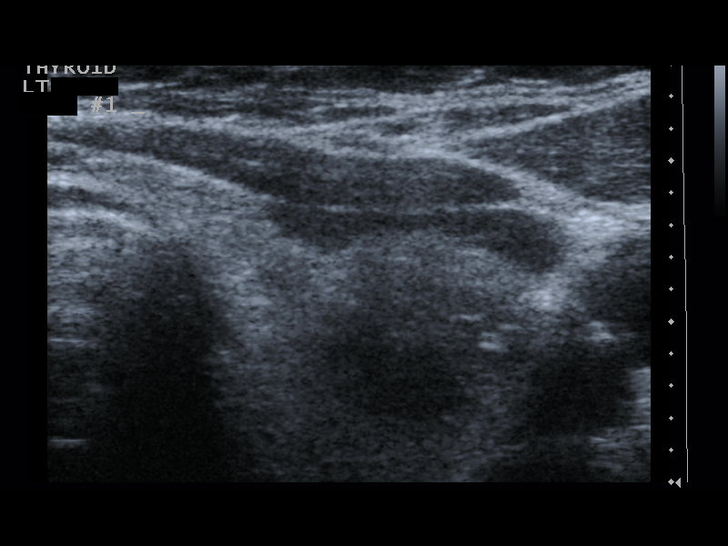
[im 8/14]
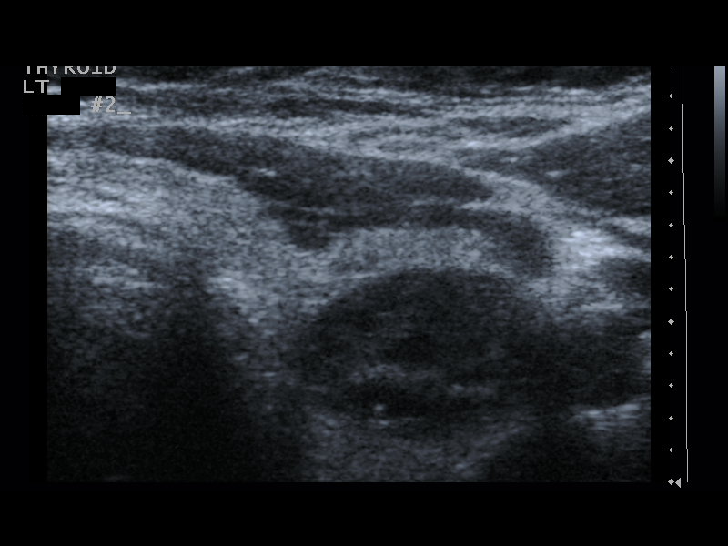
[im 9/14]
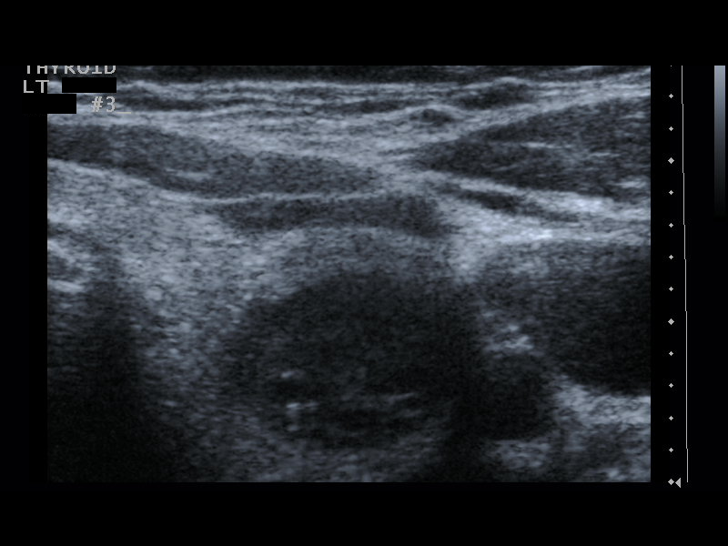
[im 10/14]
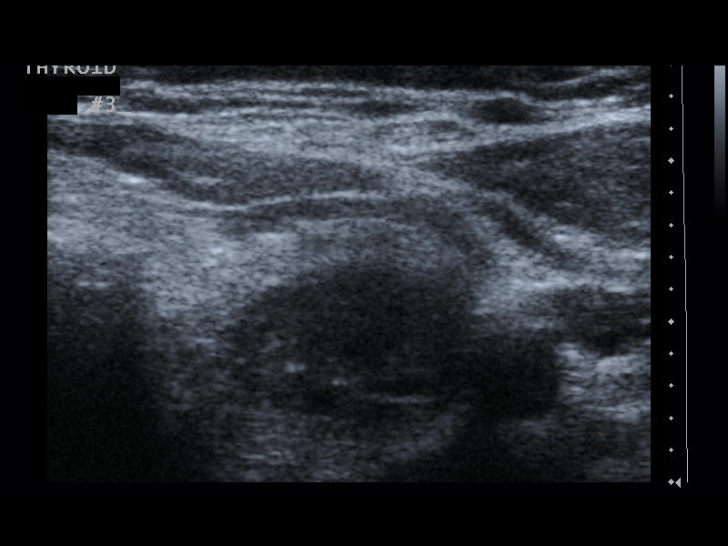
[im 11/14]
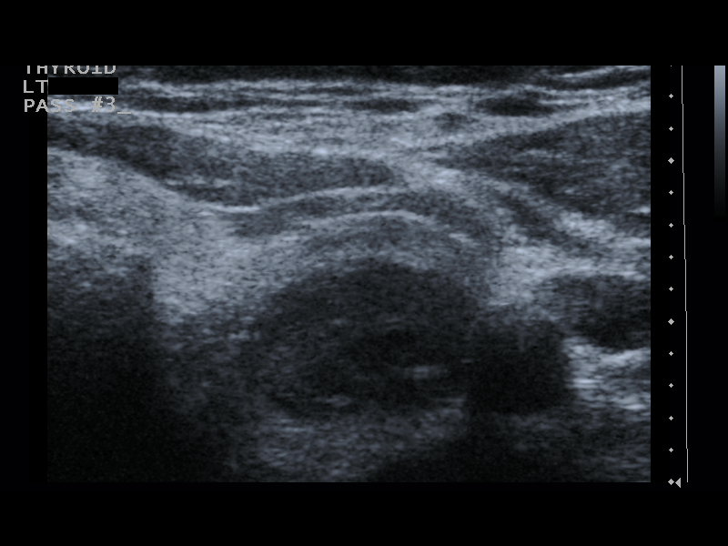
[im 12/14]
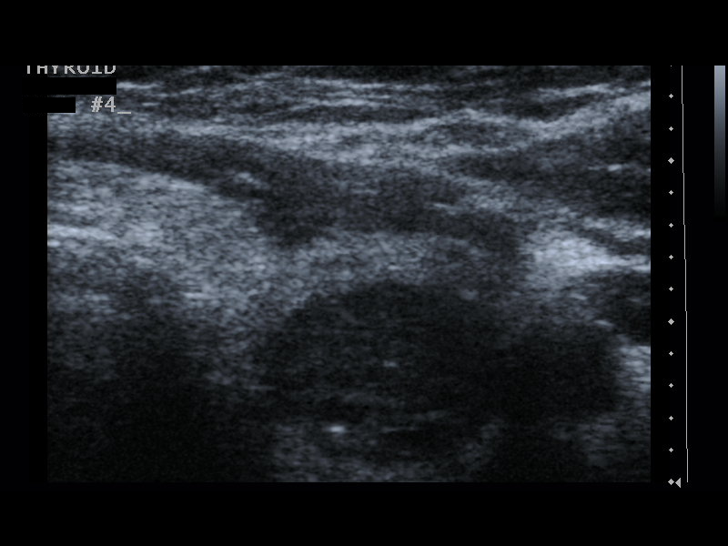
[im 13/14]
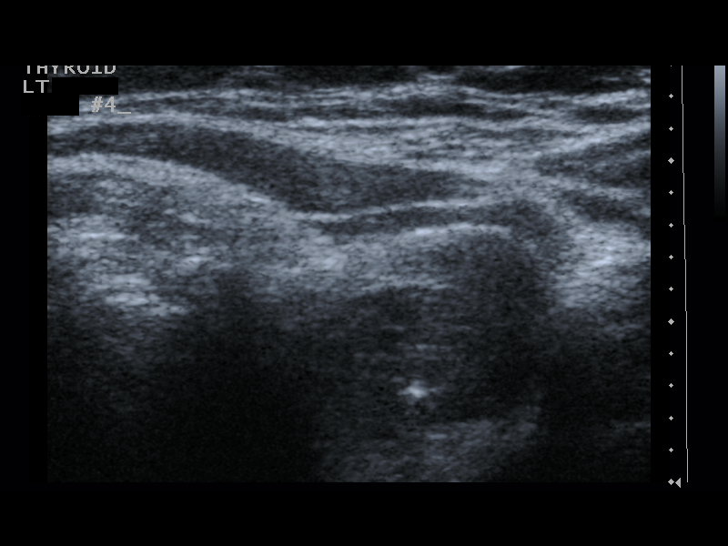
[im 14/14]
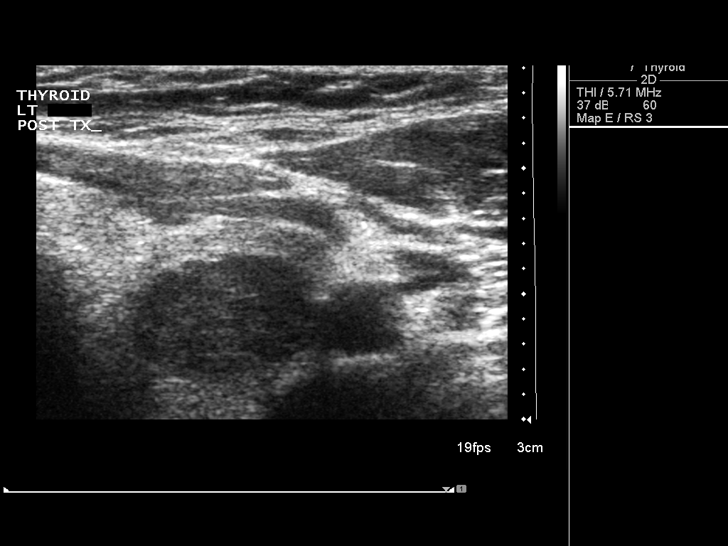

[13 of 14 positions shown; findings below may reference images not displayed]

prior nuclear
medicine thyroid uptake study 06/21/2014 in

PROCEDURE:
Thyroid biopsy was thoroughly discussed with the patient and
questions were answered. The benefits, risks, alternatives, and
complications were also discussed. The patient understands and
wishes to proceed with the procedure. Written consent was obtained.



Complications:  None
FINDINGS: Successful identification of 1.9 cm solid hypoechoic nodule in the
mid aspect of the left gland.
IMPRESSION: Ultrasound guided needle aspirate biopsy performed of the left
thyroid nodule.

## 2014-11-08 ENCOUNTER — Encounter: Payer: Self-pay | Admitting: Family Medicine

## 2014-11-08 ENCOUNTER — Ambulatory Visit (INDEPENDENT_AMBULATORY_CARE_PROVIDER_SITE_OTHER): Payer: Commercial Managed Care - PPO | Admitting: Family Medicine

## 2014-11-08 VITALS — HR 95 | Temp 98.0°F | Ht 65.0 in | Wt 211.5 lb

## 2014-11-08 DIAGNOSIS — Z0000 Encounter for general adult medical examination without abnormal findings: Secondary | ICD-10-CM

## 2014-11-08 NOTE — Progress Notes (Signed)
Pre visit review using our clinic review tool, if applicable. No additional management support is needed unless otherwise documented below in the visit note. 

## 2014-11-16 ENCOUNTER — Telehealth: Payer: Self-pay | Admitting: Internal Medicine

## 2014-11-16 ENCOUNTER — Other Ambulatory Visit: Payer: Self-pay | Admitting: *Deleted

## 2014-11-16 MED ORDER — ATENOLOL 25 MG PO TABS: 25.0000 mg | ORAL_TABLET | Freq: Every day | ORAL | Status: DC

## 2014-11-16 NOTE — Telephone Encounter (Signed)
Patient will need a refill on her Rx  Atenolol   [facility]

## 2015-02-02 ENCOUNTER — Ambulatory Visit: Payer: Commercial Managed Care - PPO | Admitting: Internal Medicine

## 2015-02-12 ENCOUNTER — Ambulatory Visit (INDEPENDENT_AMBULATORY_CARE_PROVIDER_SITE_OTHER): Payer: Commercial Managed Care - PPO | Admitting: Internal Medicine

## 2015-02-12 ENCOUNTER — Encounter: Payer: Self-pay | Admitting: Internal Medicine

## 2015-02-12 VITALS — BP 126/72 | HR 102 | Temp 98.2°F | Resp 12 | Wt 216.0 lb

## 2015-02-12 DIAGNOSIS — E041 Nontoxic single thyroid nodule: Secondary | ICD-10-CM

## 2015-02-12 DIAGNOSIS — E0510 Thyrotoxicosis with toxic single thyroid nodule without thyrotoxic crisis or storm: Secondary | ICD-10-CM

## 2015-02-12 NOTE — Progress Notes (Signed)
Patient ID: Rebecca Macias, female   DOB: 1961/05/29, 54 y.o.   MRN: [id number]   HPI  Rebecca Macias is a 54 y.o.-year-old female, returning for f/u for subclinical hyperthyroidism, due to R toxic adenoma, and also L thyroid cold nodule.  Reviewed hx: Pt had an episode of palpitations in 12/2013 >> EKG normal >> TSH low (a repeat set of thyroid labs confirmed a low TSH).   I reviewed pt's thyroid tests - last test normal: Lab Results  Component Value Date   TSH 0.42 08/02/2014   TSH 0.17* 05/05/2014   TSH 0.25* 03/23/2014   TSH 0.14* 12/16/2013   FREET4 0.81 08/02/2014   FREET4 0.78 05/05/2014   FREET4 0.75 04/20/2014   FREET4 0.84 03/23/2014   FREET4 0.79 12/16/2013    At last visit, we checked an Uptake an Scan: 2 defects: one R (hot) and one L (cold).  A thyroid U/S (06/30/2014) showed several small cystic spaces and 2 nodules of similar sizes. The one corresponding to the cold defect was: Ovoid nodule in the mid left lobe measures approximately 1.5 x 1.1 x 1.9 cm.  FNA (09/14/2014) of L nodule: benign  Pt denies feeling nodules in neck, hoarseness, dysphagia/odynophagia, SOB with lying down; she c/o: - + fatigue - no palpitations (she continues on Atenolol) - + hair loss - + dry skin - no excessive sweating/heat intolerance (had hot flushes) - + mm/joint pain - + constipation - no tremors - no increased anxiety  She started to take B12 vitamin and Biotin some time ago. Skipped it today.   .I reviewed pt's medications, allergies, PMH, social hx, family hx, and changes were documented in the history of present illness. Otherwise, unchanged from my initial visit note.  ROS: Constitutional: see HPI, + poor sleep Eyes: no blurry vision, no xerophthalmia ENT: no sore throat, no nodules palpated in throat, no dysphagia/odynophagia, no hoarseness, + tinnitus Cardiovascular: no CP/+SOB/rarely palpitations/no leg swelling Respiratory: no cough/+SOB/no  wheezing Gastrointestinal: no N/V/D/+C Musculoskeletal: + muscle/no joint aches Skin: no rash, + hair loss, + dry skin Neurological: no tremors/numbness/tingling/dizziness, + HA  PE: BP 126/72 mmHg  Pulse 102  Temp(Src) 98.2 F (36.8 C) (Oral)  Resp 12  Wt 216 lb (97.977 kg)  SpO2 95%  LMP 11/03/2012 Wt Readings from Last 3 Encounters:  02/12/15 216 lb (97.977 kg)  11/08/14 211 lb 8 oz (95.936 kg)  08/02/14 217 lb (98.431 kg)   Constitutional: obese, in NAD Eyes: PERRLA, EOMI, no exophthalmos, no lid lag, no stare ENT: moist mucous membranes, no thyromegaly, no thyroid bruits, no cervical lymphadenopathy Cardiovascular: RRR, No MRG Respiratory: CTA B Gastrointestinal: abdomen soft, NT, ND, BS+ Musculoskeletal: no deformities, strength intact in all 4 Skin: moist, warm, no rashes Neurological: very fine tremor with outstretched hands, DTR normal in all 4  ASSESSMENT: 1. 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 Relative increased uptake within a right lower pole nodule in thesetting of depressed TSH is concerning for an autonomous nodule. 2. Photopenia in the lower pole of the left lobe suggests a cold nodule. 3. Consider thyroid ultrasound prior to consideration of I 131 therapy for hyperthyroidism. 4. 24 hour I 131 uptake = 18% (normal 10-30%) - 06/30/2014: Thyroid U/S:  Right thyroid lobe Measurements: 6.3 x 1.9 x 2.2 cm. Well-circumscribed, solid mid right thyroid nodule measures approximately 1.6 x 0.8 x 2.1 cm. This is a noncalcified nodule. Tiny cystic areas are present in the inferior right lobe measuring 0.3  and 0.5 cm.   Left thyroid lobe Measurements: 5.8 x 2.1 x 2.1 cm. Ovoid nodule in the mid left lobe measures approximately 1.5 x 1.1 x 1.9 cm. This likely corresponds to the nuclear medicine abnormality. Mildly complex cyst in the superior left lobe measures 0.9 cm. Small cysts in the inferior left lobe measures 0.3 cm.   Isthmus Thickness: 0.4  cm. No nodules visualized.   Lymphadenopathy None visualized.  IMPRESSION:  Bilateral dominant thyroid nodules. Right thyroid nodule measures approximately 1.6 x 0.8 x 2.1 cm and is located in the midportion of  the right lobe. This nodule could be watched or sampled based on size criteria. Nodule in the mid left lobe measures 1.5 x 1.1 x 1.9  cm and likely corresponds to the cold nuclear medicine defect. There would be an indication to biopsy this nodule based on size criteria and nuclear medicine findings.    2. Cold L thyroid nodule - FNA benign (09/14/2014):  Adequacy Reason Satisfactory For Evaluation. Diagnosis THYROID, FINE NEEDLE ASPIRATION LMP (SPECIMEN 1 OF 1, COLLECTED ON 06/13/14): BENIGN. FINDINGS CONSISTENT WITH A BENIGN FOLLICULAR NODULE. Enid Cutter MD Pathologist, Electronic Signature (Case signed 09/14/2014)   PLAN:  1. And 2. R toxic adenoma and cold L thyroid nodule Patient with h/o subclinical hyperthyroidism, with h/o thyrotoxic sxs: heat intolerance, one episode of palpitations, now improved on the beta blocker. Last TFTs normal in 07/2014. - during investigation for her subclinical hyperthyroidism, we found that she has a R sided toxic adenoma and a cold L sided thyroid nodule (FNA >> benign) - for the R toxic nodule, I again suggested that we recheck the TSH, fT3 and fT4 today, and only intervene if the TFTs worsen. We discussed that the definitive tx is RAI tx, but the risk is hypothyroidism, so will try to postpone this if possible. She agrees. - for the L cold nodule, we will need a new U/S 2 years after the previous >> 06/2016 - continue beta blocker (Atenolol 25 mg daily) for now  - will recheck TFTs in 11/2015 Return in about 16 months (around 06/13/2016).  Office Visit on 02/12/2015  Component Date Value Ref Range Status  . TSH 02/12/2015 0.80  0.35 - 4.50 uIU/mL Final  . Free T4 02/12/2015 0.86  0.60 - 1.60 ng/dL Final  . T3, Free 02/12/2015 3.7  2.3 -  4.2 pg/mL Final   Labs are normal.

## 2015-02-12 NOTE — Patient Instructions (Signed)
Please stop at the lab.  Please return for labs ~11/2015.  Please return for a visit in 06/2016.

## 2015-02-13 LAB — TSH: TSH: 0.8 u[IU]/mL (ref 0.35–4.50)

## 2015-02-13 LAB — T4, FREE: FREE T4: 0.86 ng/dL (ref 0.60–1.60)

## 2015-02-13 LAB — T3, FREE: T3 FREE: 3.7 pg/mL (ref 2.3–4.2)

## 2015-03-27 NOTE — Discharge Summary (Signed)
PATIENT NAME:  Rebecca Macias, Rebecca Macias MR#:  [id number] DATE OF BIRTH:  [birth year]  DATE OF ADMISSION:  08/14/2012 DATE OF DISCHARGE:  08/17/2012  DISCHARGE DIAGNOSES:  1. Sigmoid diverticulitis, improving on ciprofloxacin and Flagyl. Will need for a total of two weeks.  2. Ovarian cyst incidentally found an computed tomography scan of the abdomen. Will require outpatient follow up with primary care physician.   SECONDARY DIAGNOSIS: None.   CONSULTATION: Surgery, Dr. Burt Knack.   LABORATORY, DIAGNOSTIC AND RADIOLOGICAL DATA: CT scan of the abdomen and pelvis with contrast on 09/07 showed findings consistent with acute sigmoid diverticulitis. Contained perforation is suspected. No drainable abscess. No evidence of bowel obstruction. Fatty liver changes present. 2 cm diameter right ovarian cyst present.   Urinalysis on admission was negative on 09/06. Serum Tylenol level less than 2.   HISTORY AND SHORT HOSPITAL COURSE: Patient is a 54 year old female with no significant medical problems was admitted for acute sigmoid diverticulitis. Please see Dr. Zacarias Pontes dictated history and physical for further details. She was started on IV Flagyl and Cipro along with pain medication. General surgery consult was obtained due to abnormal CT scan. They recommended continued conservative management, no surgical intervention was recommended. Patient was slowly improving on IV Cipro and Flagyl, started tolerating diet well and also was having much better pain control, was stable enough to be discharged on 09/10.   PHYSICAL EXAMINATION: VITAL SIGNS: On the date of discharge her vital signs were as follows: Temperature 97.9, heart rate 79 per minute, respirations 18 per minute, blood pressure 107/70 mmHg. She was saturating 95% on room air. Pertinent physical examination  on the date of discharge: CARDIOVASCULAR: S1, S2 normal. No murmurs, rubs, or gallop. LUNGS: Clear to auscultation bilaterally. No wheezing, rales, rhonchi,  crepitation. ABDOMEN: Soft, benign. NEUROLOGIC: Nonfocal examination. All other physical examination remained at the baseline.   DISCHARGE MEDICATIONS:  1. Metronidazole 500 mg p.o. every eight hours for 10 days.  2. Tylenol 325 mg p.o. every six hours as needed.  3. Colace 100 mg p.o. b.i.d.  4. Ciprofloxacin 500 mg p.o. b.i.d. for 10 days. 5. Zofran 4 mg p.o. every six hours as needed.   DISCHARGE DIET: Regular. Avoid nuts.   DISCHARGE ACTIVITY: As tolerated.   DISCHARGE INSTRUCTIONS AND FOLLOW UP: Patient was instructed to follow up with her new primary care physician at [facility] in 1 to 2 weeks.   TOTAL TIME DISCHARGING THIS PATIENT: 45 minutes.   ____________________________ Lucina Mellow. Manuella Ghazi, MD vss:cms D: 08/18/2012 10:12:35 ET T: 08/18/2012 11:52:53 ET JOB#: [id number]  cc: Cianna Kasparian S. Manuella Ghazi, MD, <Dictator> [facility] [place]. Burt Knack, Delia MD ELECTRONICALLY SIGNED 08/18/2012 16:51

## 2015-03-27 NOTE — Consult Note (Signed)
Brief Consult Note: Diagnosis: diverticulitis with cont perf.   Patient was seen by consultant.   Consult note dictated.   Recommend further assessment or treatment.   Orders entered.   Comments: cont conservative management.  Electronic Signatures: Florene Glen (MD)  (Signed 07-Sep-13 11:58)  Authored: Brief Consult Note   Last Updated: 07-Sep-13 11:58 by Florene Glen (MD)

## 2015-03-27 NOTE — H&P (Signed)
PATIENT NAME:  Rebecca Macias, Rebecca Macias MR#:  [id number] DATE OF BIRTH:  11/21/1961  DATE OF ADMISSION:  08/14/2012  PRIMARY CARE PHYSICIAN: She has no primary care physician.  REFERRING EMERGENCY ROOM PHYSICIAN: Dr. Lurline Hare    CHIEF COMPLAINT: Severe abdominal pain of the lower abdomen x24 hours.   HISTORY OF PRESENT ILLNESS: Ms. Stavola is a 54 year old Caucasian female with healthy past medical history who was in her usual state of health until Thursday around 7 p.m. when she had sudden feeling of sharp pain located at the suprapubic area and also slightly towards the left side or left lower quadrant area. The pain was described as sharp pain with severity reaching 10 on a scale of 10 associated with nausea but no vomiting. The patient indicates that the pain over the last 24 hours was variable in intensity. It may ease off but then escalates. In a few hours she ingested 5 tablets of Tylenol each of 500 which had helped the pain. Finally, she decided to come to the Emergency Department where a CAT scan was done and that revealed acute sigmoid diverticulitis and evidence of possible small perforation as indicated by small adjacent contained extraluminal gas collection. The patient denies having any fever. No melena. No hematochezia.   REVIEW OF SYSTEMS: CONSTITUTIONAL: Denies any fever. No chills. No fatigue. EYES: No blurring of vision. No double vision. ENT: No hearing impairment. No sore throat. No dysphagia. CARDIOVASCULAR: No chest pain. No shortness of breath. No syncope. RESPIRATORY: No cough. No shortness of breath. No chest pain. GASTROINTESTINAL: She indicates that the abdominal pain at the suprapubic area and left lower quadrant area associated with nausea. No vomiting. No diarrhea. Her last bowel movement was around Friday evening, that is yesterday. It was of normal color. GENITOURINARY: No dysuria. No frequency of urination. MUSCULOSKELETAL: No joint pain or swelling. No muscular pain or swelling.  INTEGUMENTARY: No skin rash. No ulcers. NEUROLOGY: No focal weakness. No seizure activity. No headache. PSYCHIATRY: No anxiety or depression. ENDOCRINE: No polyuria or polydipsia. No heat or cold intolerance.   PAST MEDICAL HISTORY: Negative for hypertension. Negative for diabetes. No prior GI problems.   PAST SURGICAL HISTORY:  1. Tubal ligation.  2. Bladder sling operation for urinary incontinence. 3. Pelvic repair for pelvic organ prolapse.   FAMILY HISTORY: Her mother suffered from diverticulitis and she has hypertension as well. Her father suffered from diabetes mellitus and he died at the age of 13 from complications after having heart surgery.   SOCIAL HABITS: Nonsmoker. No history of alcohol or drug abuse.   SOCIAL HISTORY: She is divorced. She works as a Microbiologist.   ADMISSION MEDICATIONS: None other than taking Tylenol over-the-counter.   ALLERGIES: No known drug allergies.   PHYSICAL EXAMINATION:   VITAL SIGNS: Blood pressure 129/74, respiratory rate 18, pulse 93, temperature 97.8, oxygen saturation 97%.   GENERAL APPEARANCE: Middle-aged female laying in bed in no acute distress.   HEAD: No pallor. No icterus. No cyanosis.   EARS, NOSE, AND THROAT: Hearing was normal. Nasal mucosa, lips, tongue were normal.   EYES: Normal eyelids and conjunctivae. Pupils about 5 mm, equal and reactive to light.   NECK: Supple. Trachea at midline. No thyromegaly. No cervical lymphadenopathy.   HEART: Normal S1, S2. No S3, S4. No murmur. No gallop. No carotid bruits.   RESPIRATORY: Normal breathing pattern without use of accessory muscles. No rales. No wheezing.   ABDOMEN: Soft. There is moderate tenderness at the infraumbilical area and suprapubic  area and also the left lower quadrant area. No rebound or rigidity. No hernia. No hepatosplenomegaly.   MUSCULOSKELETAL: No joint swelling. No clubbing.   SKIN: No ulcers. No subcutaneous nodules.   NEUROLOGIC: Cranial nerves II through  XII are intact. No focal motor deficit.   PSYCHIATRIC: The patient is alert and oriented x3. Mood and affect were normal.   LABORATORY, DIAGNOSTIC, AND RADIOLOGICAL DATA: CAT scan of the abdomen revealed segment of wall thickening of the proximal sigmoid colon consistent with acute diverticulitis. Radiologist also commented that underlying malignancy cannot be entirely excluded. There is associated inflammatory stranding. There is a small adjacent contained extraluminal gas collection. The inflammation is in contact with the superior uterine fundus. There is minimal adjacent fluid. Evidence of fatty liver.   Serum glucose 113, BUN 8, creatinine 0.5, sodium 142, potassium 3.6. Lipase 155. Liver function tests were normal. CBC showed white count of 15,000, hemoglobin 13.8, hematocrit 39, platelet count 320. Prothrombin time 13. INR 1. Urinalysis showed negative findings. Acetaminophen serum level was less than 2.   ASSESSMENT:  1. Acute sigmoid diverticulitis with possibly very small contained perforation that was sealed. There is adjacent fluid collection and inflammatory stranding but no evidence of abscess formation.  2. Fatty liver infiltration.  3. Leukocytosis.   PLAN:  1. Will admit the patient to the medical floor.  2. Keep n.p.o.  3. IV hydration.  4. Intravenous ciprofloxacin along with metronidazole or Flagyl.  5. Surgical consultation was obtained in the Emergency Department and discussion was with Dr. Marina Gravel and he felt that it is not a surgical case. I will put in forma consult for General Surgery to see the patient. 6. I will keep the patient n.p.o. until the inflammatory response is contained.  7. Pain control with morphine p.r.n.  8. Zofran p.r.n. for nausea.   TIME SPENT EVALUATING THIS PATIENT: More than 50 minutes.   ____________________________ Clovis Pu. Lenore Manner, MD amd:drc D: 08/14/2012 03:55:42 ET T: 08/14/2012 08:45:13 ET JOB#: [id number]  cc: Clovis Pu. Lenore Manner, MD,  <Dictator> Ellin Saba MD ELECTRONICALLY SIGNED 08/14/2012 22:37

## 2015-03-27 NOTE — Consult Note (Signed)
PATIENT NAME:  Rebecca Macias, Rebecca Macias MR#:  [id number] DATE OF BIRTH:  24-Feb-1961  DATE OF CONSULTATION:  08/14/2012  CONSULTING PHYSICIAN:  Jerrol Banana. Burt Knack, MD  CHIEF COMPLAINT: Left lower quadrant abdominal pain.   HISTORY OF PRESENT ILLNESS: This is a patient who has been admitted with a tentative diagnosis of contained perforation of the colon secondary to acute diverticulitis. The patient describes the acute onset of lower abdominal pain in both lower quadrants started acutely on Thursday. It is now better than it was. In fact, it does not hurt her unless she moves around very much. She had some nausea, no emesis. She has not had a bowel movement in two days. She denies fevers or chills and has never had an episode like this before. She denies melena or hematochezia.   PAST MEDICAL HISTORY: None.   MEDICATIONS: She takes no medications.   ALLERGIES: None.   PAST SURGICAL HISTORY:  1. Tubal ligation.  2. Bladder sling.   FAMILY HISTORY: Noncontributory.   SOCIAL HISTORY: She does not smoke or drink. She is employed.   REVIEW OF SYSTEMS: A 10-system review was performed and negative with the exception of that mentioned in the history of present illness.   PHYSICAL EXAMINATION:  GENERAL: Obese female patient with a BMI of 37, weight 225, height 65 inches, temperature 98.1, pulse 96, respirations 18, blood pressure 109/73.   HEENT: No scleral icterus.   NECK: No palpable neck nodes.   CHEST: Clear to auscultation.   CARDIAC: Regular rate and rhythm.   ABDOMEN: Soft. There is tenderness in both lower quadrants, left greater than right. No guarding. No percussion tenderness.   EXTREMITIES: Extremities are without edema. Calves are nontender.   NEUROLOGIC: Grossly intact.   INTEGUMENT: No jaundice.   LABORATORY, DIAGNOSTIC AND RADIOLOGICAL DATA: CT scan is personally reviewed showing contained perforation, small air bubbles around the colon and mesentery on the left side  suggestive of acute diverticulitis. Urinalysis is clean. White blood cell count is 15.0, hemoglobin and hematocrit are 13.8 and 40, and normal electrolytes.   ASSESSMENT AND PLAN: This is a patient with contained perforation, acute diverticulitis. Recommend continued observation with IV antibiotics. I discussed with her the need for emergent surgery with colostomy if she were to worsen. She understood and agreed with this plan.  ____________________________ Jerrol Banana. Burt Knack, MD rec:cbb D: 08/14/2012 12:16:43 ET T: 08/14/2012 14:24:26 ET JOB#: [id number]  cc: Jerrol Banana. Burt Knack, MD, <Dictator> Florene Glen MD ELECTRONICALLY SIGNED 08/15/2012 14:13

## 2015-03-30 NOTE — H&P (Signed)
PATIENT NAME:  Rebecca Macias, Rebecca Macias MR#:  [id number] DATE OF BIRTH:  Aug 22, 1961  DATE OF ADMISSION:  09/18/2013  ADMITTING DIAGNOSIS: Complicated perforated sigmoid diverticulitis.   HISTORY OF PRESENT ILLNESS: This is a 54 year old obese white female, admitted in September 2013 to the medical service for perforated sigmoid diverticulitis. Surgical services were asked to see her at that time. The patient was treated successfully as an outpatient and as an inpatient with IV antibiotics. She underwent an interval colonoscopy at the [facility] clinic which, by her report, was unremarkable except for diverticulosis. She now presents to the Emergency Room with an approximately one-week history of lower abdominal fullness and some mild discomfort, some urinary symptoms. The patient treated herself for constipation with a colon cleanse-type regimen. However, it did not improve and she was brought to the Emergency Room by her family earlier today. Imaging in the Emergency Room was performed, demonstrating what appears to be perforated sigmoid diverticulitis with pericolonic air and a small fluid collection seen within the mesentery. There is no air within the bladder. There is no free air. There is no free fluid in the pelvis. The remaining intraabdominal organs were unremarkable in appearance. Surgical services were asked to consult and consider admission.   ALLERGIES: The patient developed an allergic reaction in the Emergency Room to FLAGYL.  PAST MEDICAL HISTORY:  Diverticulitis.   PAST SURGICAL HISTORY: Tubal ligation and mesh bladder sling.   SOCIAL HISTORY: The patient is employed. Does not smoke. Does not drink.   REVIEW OF SYSTEMS:  Significant for nausea, abdominal pain. No fever. No jaundice. No sick contacts, and as per 10-point review, otherwise unremarkable and negative.   PHYSICAL EXAMINATION: VITAL SIGNS: Temperature 99.1, pulse of 114, blood pressure 156/90. She is 5 feet 5 inches, 208 pounds,  BMI of 34.6.  GENERAL: Affect is normal.  HEENT:  Facies are symmetrical. Extraocular muscles appear to be intact.  LUNGS:  Clear.  HEART:  Regular rate and rhythm. On my examination, there was no evidence of tachycardia.  ABDOMEN:  Soft, obese. There is a periumbilical scar. There is no obvious peritonitis, except for very deep palpation within the left lower quadrant and suprapubic midline. There is mild tenderness.  EXTREMITIES: Warm and well perfused.  NEUROLOGIC:  Examination normal. RECTAL AND GENITOURINARY: Examination was deferred.   LABORATORY VALUES: Urinalysis demonstrates 18 WBCs per high-power field. White count 10.2, hemoglobin 13.8, hematocrit 40, platelet count 396,000. Leukocyte esterase is 1+. Electrolytes are unremarkable. Liver function tests are normal. Review of CT scan is as described above.   IMPRESSION: A 54 year old white female with a previous history of complicated microperforation sigmoid diverticulitis, now presenting with a small fluid collection seen within the mesentery of the sigmoid colon, and again a microperforation related to complicated sigmoid diverticulitis.   PLAN: The patient will need to be admitted for intravenous hydration, antiemetics. Intravenous antibiotics will be started consisting of Rocephin and clindamycin. Strong consideration should be given to elective outpatient interval sigmoid colectomy and low anterior resection. I discussed this with the patient and her daughter, and all of their questions were answered.   Total time spent:  45 minutes.    ____________________________ Jeannette How Marina Gravel, MD mab:mr D: 09/18/2013 18:22:43 ET T: 09/18/2013 18:33:25 ET JOB#: [id number]  cc: Elta Guadeloupe A. Marina Gravel, MD, <Dictator> Hortencia Conradi MD ELECTRONICALLY SIGNED 09/22/2013 14:41

## 2015-03-30 NOTE — Discharge Summary (Signed)
PATIENT NAME:  Rebecca Macias, Rebecca Macias MR#:  [id number] DATE OF BIRTH:  1961-06-28  DATE OF ADMISSION:  09/18/2013 DATE OF DISCHARGE:  09/21/2013  FINAL DIAGNOSIS: Complicated microperforation, sigmoid diverticulitis.   PRINCIPAL PROCEDURES: CT scan of the abdomen and pelvis and intravenous antibiotics.   HOSPITAL COURSE SUMMARY: The patient was admitted on the evening of October 12th with a contained perforation of her lower sigmoidal region. She had a size 1 to 2 cm pericolonic fluid collection. Of note, she has had this previously approximately 1 year ago treated with IV and oral antibiotics, and interval colonoscopy demonstrated diverticulosis. On hospital day #1, the patient was doing much better. Her abdomen was soft. On hospital day #2, the patient continued to improve. She had a bowel movement. No nausea and was tolerating liquids. She was started on a regular diet and switched over to oral antibiotics. On hospital day #3, the patient was doing very well and was deemed suitable for discharge on oral antibiotics consisting of cephalexin and clindamycin. She will follow up in our office in 1 to 2 weeks' time for consideration of interval sigmoid colectomy.   ____________________________ Jeannette How Marina Gravel, MD mab:gb D: 09/29/2013 19:37:51 ET T: 09/29/2013 21:37:49 ET JOB#: [id number]  cc: Elta Guadeloupe A. Marina Gravel, MD, <Dictator> Hortencia Conradi MD ELECTRONICALLY SIGNED 09/29/2013 21:53

## 2015-03-31 NOTE — Discharge Summary (Signed)
PATIENT NAME:  Rebecca Macias, Rebecca Macias MR#:  [id number] DATE OF BIRTH:  24-Jul-1961  DATE OF ADMISSION:  01/02/2014 DATE OF DISCHARGE:  01/09/2014  BRIEF HISTORY: Ms. Larenda Reedy is a 54 year old woman admitted to the hospital on 01/02/2014 for elective sigmoid colectomy. She had had a previous episode of perforated diverticulitis, who is admitted for the plan of pursuing elective colon resection. The initial plan included a laparoscopic assisted procedure. She was taken to surgery on the morning of 01/02/2014 after appropriate mechanical bowel prep. At the time of intraoperative laparoscopy, she was seen to have her sigmoid colon adherent to the back wall of her uterus and it was felt that that we could not adequately gain exposure. The procedure was converted to an open procedure and a sigmoid colectomy continued without difficulty. The procedure was otherwise uncomplicated. She had routine postoperative course with regular return of bowel function. She is up ambulating with no particular problems. Had no wound issues. Her drain was removed on the 30th.   DISCHARGE MEDICATIONS: Include: Benadryl 25 mg 2 tablets twice a day p.r.n., hydrocodone and acetaminophen 5/325 every 4 to 6 hours p.r.n.   FINAL DISCHARGE DIAGNOSIS: Diverticulitis surgery sigmoid colectomy. ____________________________ Rodena Goldmann III, MD rle:sg D: 01/16/2014 23:08:46 ET T: 01/17/2014 06:45:51 ET JOB#: [id number]  cc: Rodena Goldmann III, MD, <Dictator> Rodena Goldmann MD ELECTRONICALLY SIGNED 01/18/2014 1:09

## 2015-03-31 NOTE — Op Note (Signed)
PATIENT NAME:  Rebecca Macias, Rebecca Macias MR#:  [id number] DATE OF BIRTH:  January 06, 1961  DATE OF PROCEDURE:  01/02/2014  PREOPERATIVE DIAGNOSIS: Diverticulitis.   POSTOPERATIVE DIAGNOSIS: Diverticulitis.   OPERATION: Sigmoid colectomy.   SURGEON: Rodena Goldmann, MD   ASSISTANT: Marta Lamas, MD; [employer] student.   ANESTHESIA: General.   OPERATIVE PROCEDURE: With the patient in the supine position, after the induction of appropriate general anesthesia, the patient's abdomen was prepped with Betadine and draped with sterile towels. The patient had been placed in lithotomy position. An alcohol wipe and Betadine impregnated Steri-Drape were utilized. An infraumbilical incision was made in the standard fashion and carried down bluntly through the subcutaneous tissue. A Veress needle was used to cannulate the peritoneal cavity. CO2 was insufflated to appropriate pressure measurements. When approximately 2.5 liters of CO2 were instilled, the Veress needle was withdrawn and an 11 mm applied medical port inserted into the peritoneal cavity. Intraperitoneal position was confirmed and CO2 was re-insufflated. A left mid abdominal port was placed under direct vision and a right lower quadrant port was placed under direct vision. The sigmoid colon was identified as was the distal rectum. The bowel appeared to be densely adherent to the back wall of the uterus. The piece of bowel in question was very difficult to expose. We placed the patient in fairly steep Trendelenburg for several minutes in order to better visualize it, but it was adherent to the posterior wall of the uterus. For that reason the patient was placed back in minimum Trendelenburg allowing for better exposure of the splenic flexure. The splenic flexure was then taken down using the LigaSure apparatus. Because of the dense adherence of the sigmoid to the uterus, I elected to proceed with an open colectomy. The scope and ports were removed. A midline incision was made  from the umbilicus to the suprapubic area and carried down through the subcutaneous tissue with Bovie electrocautery. The midline fascia was identified and opened the length of the skin incision as was the peritoneum. The Omni-Tract retractor was utilized to provide better exposure and the bowel packed in the right upper quadrant. The distal bowel was identified and appeared to be soft. It was divided using the contoured stapling device carrying a green load. The proximal sigmoid was divided in a similar fashion. The bowel was taken down between the 2 staple lines using the LigaSure apparatus. Care was taken to stay close to the bowel to avoid any danger to the retroperitoneal structures. The bowel was passed off the table and appeared to include the area of interest. Using a 29 EEA dilator the rectum was then cannulated and appeared to reach the distal sigmoid without difficulty. Proximal sigmoid was cleared and pursestring clamp placed across the bowel. A 2-0 nylon suture was used through the pursestring clamp. The bowel was opened. There was no significant spillage. An EEA-29 stapling device was brought to the table and the anvil was inserted into the proximal bowel. The pursestring was secured around the anvil. The stapler was then inserted through the rectum by the surgical assistant, brought up to the staple line. After appropriate position the spike was driven through the staple line, married to the anvil, and the stapler closed. The stapler was then fired. The rings were withdrawn and examined and both appeared to be intact. 3-0 nylon sutures were placed to take some tension off the anastomosis should any develop. The anastomosis appeared to be satisfactory. Saline was placed into the pelvis and air injected  through the rectum with no evidence of any leak. The anastomosis could not be palpated. The area was then copiously irrigated with warm saline solution. The omentum was placed over the bowel. The  abdominal wall was closed with a running suture of #1 PDS tying in the middle. The knot was buried. A Penrose drain was placed in the base of the wound and then the wound closed with staples. All of the laparoscopic incisions were closed with staples. The wound was dressed sterilely and the patient was returned to the recovery room having tolerated the procedure well. Sponge, instrument, and needle counts were correct x2 in the operating room.  ____________________________ Rodena Goldmann III, MD rle:sb D: 01/02/2014 10:29:09 ET T: 01/02/2014 11:22:18 ET JOB#: [id number]  cc: Rodena Goldmann III, MD, <Dictator> Rodena Goldmann MD ELECTRONICALLY SIGNED 01/03/2014 18:17

## 2015-04-23 ENCOUNTER — Telehealth: Payer: Self-pay | Admitting: Internal Medicine

## 2015-04-23 MED ORDER — ATENOLOL 25 MG PO TABS: 25.0000 mg | ORAL_TABLET | Freq: Every day | ORAL | Status: DC

## 2015-04-23 NOTE — Telephone Encounter (Signed)
Pt needs atenenol called in she has one pill left call into [facility] in [place] please

## 2015-04-23 NOTE — Telephone Encounter (Signed)
Rx refill sent to pt's pharmacy 

## 2015-05-08 ENCOUNTER — Ambulatory Visit (INDEPENDENT_AMBULATORY_CARE_PROVIDER_SITE_OTHER): Payer: Commercial Managed Care - PPO | Admitting: Family Medicine

## 2015-05-08 ENCOUNTER — Encounter: Payer: Self-pay | Admitting: *Deleted

## 2015-05-08 ENCOUNTER — Other Ambulatory Visit (HOSPITAL_COMMUNITY)
Admission: RE | Admit: 2015-05-08 | Discharge: 2015-05-08 | Disposition: A | Discharge status: Home / Self Care | Payer: Commercial Managed Care - PPO | Source: Ambulatory Visit | Attending: Family Medicine | Admitting: Family Medicine

## 2015-05-08 ENCOUNTER — Encounter: Payer: Self-pay | Admitting: Family Medicine

## 2015-05-08 VITALS — BP 126/74 | HR 89 | Temp 98.9°F | Ht 65.0 in | Wt 227.5 lb

## 2015-05-08 DIAGNOSIS — Z0000 Encounter for general adult medical examination without abnormal findings: Secondary | ICD-10-CM

## 2015-05-08 DIAGNOSIS — Z23 Encounter for immunization: Secondary | ICD-10-CM

## 2015-05-08 DIAGNOSIS — E041 Nontoxic single thyroid nodule: Secondary | ICD-10-CM | POA: Diagnosis not present

## 2015-05-08 DIAGNOSIS — G4700 Insomnia, unspecified: Secondary | ICD-10-CM | POA: Diagnosis not present

## 2015-05-08 DIAGNOSIS — E669 Obesity, unspecified: Secondary | ICD-10-CM

## 2015-05-08 DIAGNOSIS — Z1151 Encounter for screening for human papillomavirus (HPV): Secondary | ICD-10-CM | POA: Diagnosis present

## 2015-05-08 DIAGNOSIS — N951 Menopausal and female climacteric states: Secondary | ICD-10-CM | POA: Insufficient documentation

## 2015-05-08 DIAGNOSIS — R002 Palpitations: Secondary | ICD-10-CM | POA: Diagnosis not present

## 2015-05-08 DIAGNOSIS — Z01419 Encounter for gynecological examination (general) (routine) without abnormal findings: Secondary | ICD-10-CM | POA: Insufficient documentation

## 2015-05-08 LAB — COMPREHENSIVE METABOLIC PANEL
ALT: 38 U/L — AB (ref 0–35)
AST: 38 U/L — AB (ref 0–37)
Albumin: 4.3 g/dL (ref 3.5–5.2)
Alkaline Phosphatase: 101 U/L (ref 39–117)
BILIRUBIN TOTAL: 0.5 mg/dL (ref 0.2–1.2)
BUN: 12 mg/dL (ref 6–23)
CO2: 29 mEq/L (ref 19–32)
Calcium: 9.7 mg/dL (ref 8.4–10.5)
Chloride: 103 mEq/L (ref 96–112)
Creatinine, Ser: 0.74 mg/dL (ref 0.40–1.20)
GFR: 87.33 mL/min (ref >60.00)
Glucose, Bld: 143 mg/dL — ABNORMAL HIGH (ref 70–99)
Potassium: 3.9 mEq/L (ref 3.5–5.1)
Sodium: 140 mEq/L (ref 135–145)
TOTAL PROTEIN: 8 g/dL (ref 6.0–8.3)

## 2015-05-08 LAB — CBC WITH DIFFERENTIAL/PLATELET
Basophils Absolute: 0 10*3/uL (ref 0.0–0.1)
Basophils Relative: 0.5 % (ref 0.0–3.0)
EOS ABS: 0.2 10*3/uL (ref 0.0–0.7)
EOS PCT: 2.4 % (ref 0.0–5.0)
HEMATOCRIT: 42.8 % (ref 36.0–46.0)
Hemoglobin: 14.3 g/dL (ref 12.0–15.0)
LYMPHS PCT: 41.9 % (ref 12.0–46.0)
Lymphs Abs: 3.2 10*3/uL (ref 0.7–4.0)
MCHC: 33.3 g/dL (ref 30.0–36.0)
MCV: 90.8 fl (ref 78.0–100.0)
MONOS PCT: 5 % (ref 3.0–12.0)
Monocytes Absolute: 0.4 10*3/uL (ref 0.1–1.0)
NEUTROS ABS: 3.9 10*3/uL (ref 1.4–7.7)
Neutrophils Relative %: 50.2 % (ref 43.0–77.0)
Platelets: 325 10*3/uL (ref 150.0–400.0)
RBC: 4.72 Mil/uL (ref 3.87–5.11)
RDW: 13.8 % (ref 11.5–15.5)
WBC: 7.7 10*3/uL (ref 4.0–10.5)

## 2015-05-08 LAB — LIPID PANEL
CHOL/HDL RATIO: 4
Cholesterol: 211 mg/dL — ABNORMAL HIGH (ref 0–200)
HDL: 53.4 mg/dL (ref >39.00)
LDL CALC: 131 mg/dL — AB (ref 0–99)
NONHDL: 157.6
Triglycerides: 132 mg/dL (ref 0.0–149.0)
VLDL: 26.4 mg/dL (ref 0.0–40.0)

## 2015-05-08 NOTE — Assessment & Plan Note (Signed)
Continue beta blocker at current dose.

## 2015-05-08 NOTE — Assessment & Plan Note (Signed)
Followed by endocrinology. No rx other than b blocker for palpitations.

## 2015-05-08 NOTE — Assessment & Plan Note (Signed)
  Discussed risks and benefits of HRT.  Pt has a uterus and needs progesterone with estrogen to prevent endometrial hyperplasia (20-50% of patients on unopposed estrogen for 1 yr will develop hyperplasia). She would like to hold off on this for now.

## 2015-05-08 NOTE — Assessment & Plan Note (Signed)
>  25 min spent with patient, at least half of which was spent on counseling insomnia and HRT.  The problem of recurrent insomnia is discussed. Avoidance of caffeine sources is strongly encouraged. Sleep hygiene issues are reviewed. Advised to try OTC melatonin.

## 2015-05-08 NOTE — Progress Notes (Signed)
Subjective:    Patient ID: Rebecca Macias, female    DOB: 03/10/1961, 54 y.o.   MRN: [id number]  HPI  54 yo very pleasant G6P5 with here for CPX and to discuss chronic medical conditions.  Pap smear 09/01/12 (done by me).  LMP in 01/2013- no post menopausal bleeding. She has been having some hot flashes and insomnia.  Wants to disucss HRT.  Difficulty falling and sometimes staying asleep.   Antihistamines are effective but cause headaches and residual grogginess the next morning.  No h/o abnormal pap smears, no family h/o breast, uterine or ovarian CA. Mammogram 06/01/14  Colonoscopy 11/09/12 (Dr. Olevia Perches)- 10 year recall. Did have to have portion of colon removed in 12/2013 due to diverticulitis with abscess/perforation.  Bowel have been ok- no blood in her stool, no abdominal pain.  Low TSH- I referred her to endocrinology in 03/2014.  Last saw Dr. Cruzita Lederer on 02/12/15- note reviewed.  On atenolol for palpitations.  Nodules (hot/cold)- watchful waiting because normal TSH.   Wt Readings from Last 3 Encounters:  05/08/15 227 lb 8 oz (103.193 kg)  02/12/15 216 lb (97.977 kg)  11/08/14 211 lb 8 oz (95.936 kg)   Lab Results  Component Value Date   WBC 9.0 01/05/2014   HGB 11.1* 01/05/2014   HCT 32.5* 01/05/2014   MCV 89 01/05/2014   PLT 237 01/05/2014   Lab Results  Component Value Date   CHOL 197 04/20/2014   HDL 41.00 04/20/2014   LDLCALC 128* 04/20/2014   LDLDIRECT 144.9 12/16/2012   TRIG 141.0 04/20/2014   CHOLHDL 5 04/20/2014   Lab Results  Component Value Date   CREATININE 0.7 04/20/2014   Lab Results  Component Value Date   NA 139 04/20/2014   K 4.2 04/20/2014   CL 106 04/20/2014   CO2 29 04/20/2014     Lab Results  Component Value Date   TSH 0.80 02/12/2015   Wt Readings from Last 3 Encounters:  05/08/15 227 lb 8 oz (103.193 kg)  02/12/15 216 lb (97.977 kg)  11/08/14 211 lb 8 oz (95.936 kg)    Lab Results  Component Value Date   CHOL 197 04/20/2014   HDL 41.00 04/20/2014   LDLCALC 128* 04/20/2014   LDLDIRECT 144.9 12/16/2012   TRIG 141.0 04/20/2014   CHOLHDL 5 04/20/2014     Patient Active Problem List   Diagnosis Date Noted  . 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 Routine general medical examination at a health care facility 04/20/2014  . 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 Obesity (BMI 35.0-39.9 without comorbidity) 09/01/2012  . Sigmoid diverticulitis   . Ovarian cyst    Past Medical History  Diagnosis Date  . Sigmoid diverticulitis 08/2012  . Ovarian cyst   . H/O gestational diabetes mellitus, not currently pregnant    Past Surgical History  Procedure Laterality Date  . Tubal ligation    . Bladder suspension  2009   History  Substance Use Topics  . Smoking status: Former Smoker    Quit date: 09/19/2011  . Smokeless tobacco: Never Used  . Alcohol Use: No   Family History  Problem Relation Age of Onset  . Heart disease Father   . Kidney disease Father   . Diabetes Father   . Diabetes Maternal Grandmother   . Diabetes Paternal Grandmother    Allergies  Allergen Reactions  . Flagyl [Metronidazole] Itching   Current Outpatient Prescriptions on File Prior to Visit  Medication Sig Dispense Refill  . atenolol (  TENORMIN) 25 MG tablet Take 1 tablet (25 mg total) by mouth daily. 45 tablet 2  . Biotin 1000 MCG tablet Take 1,000 mcg by mouth daily.    . cyanocobalamin 500 MCG tablet Take 500 mcg by mouth daily.    Marland Kitchen docusate sodium (STOOL SOFTENER) 100 MG capsule Take 200 mg by mouth daily.    Marland Kitchen ibuprofen (ADVIL,MOTRIN) 200 MG tablet Take 600 mg by mouth every 6 (six) hours as needed.     No current facility-administered medications on file prior to visit.   The PMH, PSH, Social History, Family History, Medications, and allergies have been reviewed in [facility], and have been updated if relevant.   Review of Systems  Constitutional: Negative.   HENT: Negative.   Respiratory: Negative.   Cardiovascular: Negative.   Endocrine:  Positive for heat intolerance.  Genitourinary: Negative.   Musculoskeletal: Negative.   Skin: Negative.   Allergic/Immunologic: Negative.   Neurological: Negative.   Hematological: Negative.   Psychiatric/Behavioral: Positive for sleep disturbance. Negative for suicidal ideas and self-injury. The patient is not nervous/anxious.   All other systems reviewed and are negative.  See HPI      Objective:   Physical Exam BP 126/74 mmHg  Pulse 89  Temp(Src) 98.9 F (37.2 C) (Oral)  Ht 5\' 5"  (1.651 m)  Wt 227 lb 8 oz (103.193 kg)  BMI 37.86 kg/m2  SpO2 98%  LMP 11/03/2012  General:  Obese, pleasant female,well-nourished,in no acute distress; alert,appropriate and cooperative throughout examination Head:  normocephalic and atraumatic.   Eyes:  vision grossly intact, pupils equal, pupils round, and pupils reactive to light.   Ears:  R ear normal and L ear normal.   Nose:  no external deformity.   Mouth:  good dentition.   Neck:  No deformities, masses, or tenderness noted. Breasts:  No mass, nodules, thickening, tenderness, bulging, retraction, inflamation, nipple discharge  Bilateral raised lesions on nipples bilaterally- per pt have been there for as long as she can remember Lungs:  Normal respiratory effort, chest expands symmetrically. Lungs are clear to auscultation, no crackles or wheezes. Heart:  Normal rate and regular rhythm. S1 and S2 normal without gallop, murmur, click, rub or other extra sounds. Abdomen:  Bowel sounds positive,abdomen soft and non-tender without masses, organomegaly or hernias noted. Old healed vertical surgical scar Rectal:  no external abnormalities.   Genitalia:  Pelvic Exam:        External: normal female genitalia without lesions or masses        Vagina: normal without lesions or masses        Cervix: normal without lesions or masses        Adnexa: normal bimanual exam without masses or fullness        Uterus: normal by palpation        Pap smear:  performed Msk:  No deformity or scoliosis noted of thoracic or lumbar spine.   Extremities:  No clubbing, cyanosis, edema, or deformity noted with normal full range of motion of all joints.   Neurologic:  alert & oriented X3 and gait normal.   Skin:  Intact without suspicious lesions or rashes Cervical Nodes:  No lymphadenopathy noted Axillary Nodes:  No palpable lymphadenopathy Psych:  Cognition and judgment appear intact. Alert and cooperative with normal attention span and concentration. No apparent delusions, illusions, hallucinations,    Assessment & Plan:

## 2015-05-08 NOTE — Patient Instructions (Signed)
Great to see you. Try melatonin OTC. Keep me updated.

## 2015-05-08 NOTE — Assessment & Plan Note (Signed)
Reviewed preventive care protocols, scheduled due services, and updated immunizations Discussed nutrition, exercise, diet, and healthy lifestyle.  Pap smear today today. Tdap today.  Orders Placed This Encounter  Procedures  . Tdap vaccine greater than or equal to 53yo IM  . CBC with Differential/Platelet  . Comprehensive metabolic panel  . Lipid panel

## 2015-05-08 NOTE — Progress Notes (Signed)
Pre visit review using our clinic review tool, if applicable. No additional management support is needed unless otherwise documented below in the visit note. 

## 2015-05-10 LAB — CYTOLOGY - PAP

## 2015-05-11 ENCOUNTER — Encounter: Payer: Self-pay | Admitting: *Deleted

## 2015-06-14 ENCOUNTER — Encounter: Payer: Self-pay | Admitting: Internal Medicine

## 2015-06-14 ENCOUNTER — Ambulatory Visit (INDEPENDENT_AMBULATORY_CARE_PROVIDER_SITE_OTHER): Payer: Commercial Managed Care - PPO | Admitting: Internal Medicine

## 2015-06-14 VITALS — BP 110/64 | HR 98 | Temp 98.3°F | Resp 12 | Wt 226.0 lb

## 2015-06-14 DIAGNOSIS — E041 Nontoxic single thyroid nodule: Secondary | ICD-10-CM | POA: Diagnosis not present

## 2015-06-14 DIAGNOSIS — E0510 Thyrotoxicosis with toxic single thyroid nodule without thyrotoxic crisis or storm: Secondary | ICD-10-CM | POA: Diagnosis not present

## 2015-06-14 NOTE — Progress Notes (Signed)
Patient ID: Rebecca Macias, female   DOB: 1961/05/01, 54 y.o.   MRN: [id number]   HPI  [name] is a 54 y.o.-year-old female, returning for f/u for subclinical hyperthyroidism, due to R toxic adenoma, and also L thyroid cold nodule. Last visit 4 mo ago.  Reviewed hx: Pt had an episode of palpitations in 12/2013 >> EKG normal >> TSH low (a repeat set of thyroid labs confirmed a low TSH).   I reviewed pt's thyroid tests - last test normal: Lab Results  Component Value Date   TSH 0.80 02/12/2015   TSH 0.42 08/02/2014   TSH 0.17* 05/05/2014   TSH 0.25* 03/23/2014   TSH 0.14* 12/16/2013   FREET4 0.86 02/12/2015   FREET4 0.81 08/02/2014   FREET4 0.78 05/05/2014   FREET4 0.75 04/20/2014   FREET4 0.84 03/23/2014   FREET4 0.79 12/16/2013    At last visit, we checked an Uptake an Scan: 2 defects: one R (hot) and one L (cold).  A thyroid U/S (06/30/2014) showed several small cystic spaces and 2 nodules of similar sizes. The one corresponding to the cold defect was: Ovoid nodule in the mid left lobe measures approximately 1.5 x 1.1 x 1.9 cm.  FNA (09/14/2014) of L nodule: benign  Pt denies feeling nodules in neck, hoarseness, dysphagia/odynophagia, SOB with lying down; she c/o: - + poor sleep - + weight gain - 10 lbs  - no fatigue - no palpitations (she continues on Atenolol) - improved hair loss - + dry skin - no excessive sweating/heat intolerance (had hot flushes) - no mm/joint pain - no constipation - no tremors - no increased anxiety  She started to take B12 vitamin and Biotin some time ago. Skipped it today.   .I reviewed pt's medications, allergies, PMH, social hx, family hx, and changes were documented in the history of present illness. Otherwise, unchanged from my initial visit note.  ROS: Constitutional: see HPI, + poor sleep Eyes: no blurry vision, no xerophthalmia ENT: no sore throat, no nodules palpated in throat, no dysphagia/odynophagia, no hoarseness, +  tinnitus Cardiovascular: no CP/+SOB/rarely palpitations/no leg swelling Respiratory: no cough/+SOB/no wheezing Gastrointestinal: no N/V/D/+C Musculoskeletal: no muscle/no joint aches Skin: no rash, + hair loss, + dry skin Neurological: no tremors/numbness/tingling/dizziness, + HA  PE: BP 110/64 mmHg  Pulse 98  Temp(Src) 98.3 F (36.8 C) (Oral)  Resp 12  Wt 226 lb (102.513 kg)  SpO2 96%  LMP 11/03/2012 Wt Readings from Last 3 Encounters:  06/14/15 226 lb (102.513 kg)  05/08/15 227 lb 8 oz (103.193 kg)  02/12/15 216 lb (97.977 kg)   Constitutional: obese, in NAD Eyes: PERRLA, EOMI, no exophthalmos, no lid lag, no stare ENT: moist mucous membranes, no thyromegaly, no cervical lymphadenopathy Cardiovascular: RRR, No MRG Respiratory: CTA B Gastrointestinal: abdomen soft, NT, ND, BS+ Musculoskeletal: no deformities, strength intact in all 4 Skin: moist, warm, no rashes Neurological: no tremor with outstretched hands, DTR normal in all 4  ASSESSMENT: 1. 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 Relative increased uptake within a right lower pole nodule in thesetting of depressed TSH is concerning for an autonomous nodule. 2. Photopenia in the lower pole of the left lobe suggests a cold nodule. 3. Consider thyroid ultrasound prior to consideration of I 131 therapy for hyperthyroidism. 4. 24 hour I 131 uptake = 18% (normal 10-30%) - 06/30/2014: Thyroid U/S:  Right thyroid lobe Measurements: 6.3 x 1.9 x 2.2 cm. Well-circumscribed, solid mid right thyroid nodule measures approximately  1.6 x 0.8 x 2.1 cm. This is a noncalcified nodule. Tiny cystic areas are present in the inferior right lobe measuring 0.3 and 0.5 cm.   Left thyroid lobe Measurements: 5.8 x 2.1 x 2.1 cm. Ovoid nodule in the mid left lobe measures approximately 1.5 x 1.1 x 1.9 cm. This likely corresponds to the nuclear medicine abnormality. Mildly complex cyst in the superior left lobe measures 0.9 cm.  Small cysts in the inferior left lobe measures 0.3 cm.   Isthmus Thickness: 0.4 cm. No nodules visualized.   Lymphadenopathy None visualized.  IMPRESSION:  Bilateral dominant thyroid nodules. Right thyroid nodule measures approximately 1.6 x 0.8 x 2.1 cm and is located in the midportion of  the right lobe. This nodule could be watched or sampled based on size criteria. Nodule in the mid left lobe measures 1.5 x 1.1 x 1.9  cm and likely corresponds to the cold nuclear medicine defect. There would be an indication to biopsy this nodule based on size criteria and nuclear medicine findings.    2. Cold L thyroid nodule - FNA benign (09/14/2014):  Adequacy Reason Satisfactory For Evaluation. Diagnosis THYROID, FINE NEEDLE ASPIRATION LMP (SPECIMEN 1 OF 1, COLLECTED ON 06/13/14): BENIGN. FINDINGS CONSISTENT WITH A BENIGN FOLLICULAR NODULE. Enid Cutter MD Pathologist, Electronic Signature (Case signed 09/14/2014)   PLAN:  1. And 2. R toxic adenoma and cold L thyroid nodule Patient with h/o subclinical hyperthyroidism, with h/o thyrotoxic sxs: heat intolerance, one episode of palpitations, now improved on the beta blocker. Last TFTs normal x 2. - during investigation for her subclinical hyperthyroidism, we found that she has a R sided toxic adenoma and a cold L sided thyroid nodule (FNA >> benign) - for the R toxic nodule, I again suggested that we recheck the TSH, fT3 and fT4 today, and only intervene if the TFTs worsen.  - for the L cold nodule, we will need a new U/S 2 years after the previous >> 06/2016 - continue beta blocker (Atenolol 25 mg daily) for now, but may need a cardiology appt to elucidate the etiology of her tachycardia - will recheck TFTs in 01/2016  Return in about 1 year (around 06/13/2016).  Component     Latest Ref Rng 06/14/2015  TSH     0.35 - 4.50 uIU/mL 0.50  Free T4     0.60 - 1.60 ng/dL 0.87  T3, Free     2.3 - 4.2 pg/mL 3.8  Normal thyroid tests.

## 2015-06-14 NOTE — Patient Instructions (Signed)
Please stop at the lab.  Please come back for labs in 6 months and for a visit in 1 year. 

## 2015-06-15 LAB — T3, FREE: T3, Free: 3.8 pg/mL (ref 2.3–4.2)

## 2015-06-15 LAB — TSH: TSH: 0.5 u[IU]/mL (ref 0.35–4.50)

## 2015-06-15 LAB — T4, FREE: Free T4: 0.87 ng/dL (ref 0.60–1.60)

## 2015-06-22 ENCOUNTER — Telehealth: Payer: Self-pay | Admitting: Family Medicine

## 2015-06-22 NOTE — Telephone Encounter (Signed)
Please call pt to remind her that she is due for a mammogram.

## 2015-06-22 NOTE — Telephone Encounter (Signed)
Lm on pts vm and advised. If she is due, she should be on the mammogram contact list

## 2015-09-20 ENCOUNTER — Telehealth: Payer: Self-pay | Admitting: Internal Medicine

## 2015-09-20 MED ORDER — ATENOLOL 25 MG PO TABS: 25.0000 mg | ORAL_TABLET | Freq: Every day | ORAL | Status: DC

## 2015-09-20 NOTE — Telephone Encounter (Signed)
Patient need refill of atenolol (TENORMIN) 25 MG tablet, send to [facility]

## 2015-09-20 NOTE — Telephone Encounter (Signed)
Done

## 2015-12-17 ENCOUNTER — Other Ambulatory Visit (INDEPENDENT_AMBULATORY_CARE_PROVIDER_SITE_OTHER): Payer: Commercial Managed Care - PPO

## 2015-12-17 ENCOUNTER — Other Ambulatory Visit: Payer: Commercial Managed Care - PPO

## 2015-12-17 DIAGNOSIS — E0510 Thyrotoxicosis with toxic single thyroid nodule without thyrotoxic crisis or storm: Secondary | ICD-10-CM | POA: Diagnosis not present

## 2015-12-17 LAB — T3, FREE: T3 FREE: 3.6 pg/mL (ref 2.3–4.2)

## 2015-12-17 LAB — TSH: TSH: 0.44 u[IU]/mL (ref 0.35–4.50)

## 2015-12-17 LAB — T4, FREE: FREE T4: 0.73 ng/dL (ref 0.60–1.60)

## 2016-01-17 ENCOUNTER — Encounter: Payer: Self-pay | Admitting: Internal Medicine

## 2016-01-17 ENCOUNTER — Ambulatory Visit (INDEPENDENT_AMBULATORY_CARE_PROVIDER_SITE_OTHER): Payer: Commercial Managed Care - PPO | Admitting: Internal Medicine

## 2016-01-17 VITALS — BP 130/82 | HR 91 | Temp 98.8°F | Wt 226.0 lb

## 2016-01-17 DIAGNOSIS — M545 Low back pain, unspecified: Secondary | ICD-10-CM

## 2016-01-17 MED ORDER — NAPROXEN 500 MG PO TABS: 500.0000 mg | ORAL_TABLET | Freq: Two times a day (BID) | ORAL | Status: DC

## 2016-01-17 MED ORDER — METHOCARBAMOL 750 MG PO TABS: 750.0000 mg | ORAL_TABLET | Freq: Three times a day (TID) | ORAL | Status: DC | PRN

## 2016-01-17 NOTE — Progress Notes (Signed)
Subjective:    Patient ID: Rebecca Macias, female    DOB: 12-08-1961, 55 y.o.   MRN: [id number]  HPI  Pt presents to the clinic today with c/o right lower back pain. She reports this started 2 days ago. She describes the pain as sharp and stabbing. The pain is worse with standing and walking. She denies numbness and tingling in her legs. She denies loss of bowel or bladder. She denies any injury to the area. She has tried Flexeril, Aleve, [employer], Ibuprofen and Biofreeze without any relief.  Review of Systems      Past Medical History  Diagnosis Date  . Sigmoid diverticulitis 08/2012  . Ovarian cyst   . H/O gestational diabetes mellitus, not currently pregnant     Current Outpatient Prescriptions  Medication Sig Dispense Refill  . atenolol (TENORMIN) 25 MG tablet Take 1 tablet (25 mg total) by mouth daily. 45 tablet 2  . Biotin 1000 MCG tablet Take 1,000 mcg by mouth daily.    . cyanocobalamin 500 MCG tablet Take 500 mcg by mouth daily.    Marland Kitchen docusate sodium (STOOL SOFTENER) 100 MG capsule Take 200 mg by mouth daily.    Marland Kitchen ibuprofen (ADVIL,MOTRIN) 200 MG tablet Take 600 mg by mouth every 6 (six) hours as needed.     No current facility-administered medications for this visit.    Allergies  Allergen Reactions  . Flagyl [Metronidazole] Itching    Family History  Problem Relation Age of Onset  . Heart disease Father   . Kidney disease Father   . Diabetes Father   . Diabetes Maternal Grandmother   . Diabetes Paternal Grandmother     Social History   Social History  . Marital Status: Divorced    Spouse Name: N/A  . Number of Children: N/A  . Years of Education: N/A   Occupational History  . Not on file.   Social History Main Topics  . Smoking status: Former Smoker    Quit date: 09/19/2011  . Smokeless tobacco: Never Used  . Alcohol Use: No  . Drug Use: No  . Sexual Activity: Not on file   Other Topics Concern  . 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     Constitutional: Denies fever, malaise, fatigue, headache or abrupt weight changes.  Respiratory: Denies difficulty breathing, shortness of breath, cough or sputum production.   Cardiovascular: Denies chest pain, chest tightness, palpitations or swelling in the hands or feet.  Gastrointestinal: Denies abdominal pain, bloating, constipation, diarrhea or blood in the stool.  GU: Denies urgency, frequency, pain with urination, burning sensation, blood in urine, odor or discharge. Musculoskeletal: Pt reports low back pain. Denies decrease in range of motion, difficulty with gait, or joint pain and swelling.  Skin: Denies redness, rashes, lesions or ulcercations.  Neurological: Denies dizziness, difficulty with memory, difficulty with speech or problems with balance and coordination.    No other specific complaints in a complete review of systems (except as listed in HPI above).  Objective:   Physical Exam  BP 130/82 mmHg  Pulse 91  Temp(Src) 98.8 F (37.1 C) (Oral)  Wt 226 lb (102.513 kg)  SpO2 97%  LMP 11/03/2012 Wt Readings from Last 3 Encounters:  01/17/16 226 lb (102.513 kg)  06/14/15 226 lb (102.513 kg)  05/08/15 227 lb 8 oz (103.193 kg)    General: Appears her stated  age, obese in NAD. Cardiovascular: Normal rate and rhythm. S1,S2 noted.  No murmur, rubs or gallops noted.  Pulmonary/Chest: Normal effort and positive vesicular breath sounds. No respiratory distress. No wheezes, rales or ronchi noted.  Abdomen: Soft and nontender. Normal bowel sounds. Musculoskeletal: Decreased flexion and lateral bending of the spine. Normal extension and rotation. No bony tenderness noted over the spine. She does have pain with palpation of the right SI joint. No pain with palpation of the right paralumbar muscles. Neurological: Alert and oriented.    BMET    Component Value Date/Time   NA 140  05/08/2015 1237   NA 135* 01/05/2014 0457   K 3.9 05/08/2015 1237   K 4.1 01/05/2014 0457   CL 103 05/08/2015 1237   CL 100 01/05/2014 0457   CO2 29 05/08/2015 1237   CO2 33* 01/05/2014 0457   GLUCOSE 143* 05/08/2015 1237   GLUCOSE 154* 01/05/2014 0457   BUN 12 05/08/2015 1237   BUN 4* 01/05/2014 0457   CREATININE 0.74 05/08/2015 1237   CREATININE 0.74 01/09/2014 0504   CALCIUM 9.7 05/08/2015 1237   CALCIUM 8.9 01/05/2014 0457   GFRNONAA >60 01/09/2014 0504   GFRAA >60 01/09/2014 0504    Lipid Panel     Component Value Date/Time   CHOL 211* 05/08/2015 1237   TRIG 132.0 05/08/2015 1237   HDL 53.40 05/08/2015 1237   CHOLHDL 4 05/08/2015 1237   VLDL 26.4 05/08/2015 1237   LDLCALC 131* 05/08/2015 1237    CBC    Component Value Date/Time   WBC 7.7 05/08/2015 1237   WBC 9.0 01/05/2014 0457   RBC 4.72 05/08/2015 1237   RBC 3.63* 01/05/2014 0457   HGB 14.3 05/08/2015 1237   HGB 11.1* 01/05/2014 0457   HCT 42.8 05/08/2015 1237   HCT 32.5* 01/05/2014 0457   PLT 325.0 05/08/2015 1237   PLT 237 01/05/2014 0457   MCV 90.8 05/08/2015 1237   MCV 89 01/05/2014 0457   MCH 30.6 01/05/2014 0457   MCHC 33.3 05/08/2015 1237   MCHC 34.2 01/05/2014 0457   RDW 13.8 05/08/2015 1237   RDW 13.8 01/05/2014 0457   LYMPHSABS 3.2 05/08/2015 1237   LYMPHSABS 2.9 01/05/2014 0457   MONOABS 0.4 05/08/2015 1237   MONOABS 0.6 01/05/2014 0457   EOSABS 0.2 05/08/2015 1237   EOSABS 0.3 01/05/2014 0457   BASOSABS 0.0 05/08/2015 1237   BASOSABS 0.1 01/05/2014 0457    Hgb A1C No results found for: HGBA1C       Assessment & Plan:   Low back pain:  ? Muscle strain versus SI joint inflammation eRx for Naproxen 500 mg BID- Tylenol as needed for breakthrough pain eRx for Robaxin 750 mg TID prn Stretching exercises given If pain persist or worsens, consider xray of lumbar spine  RTC as needed or if symptoms persist or worsen

## 2016-01-17 NOTE — Patient Instructions (Signed)

## 2016-01-17 NOTE — Progress Notes (Signed)
Pre visit review using our clinic review tool, if applicable. No additional management support is needed unless otherwise documented below in the visit note. 

## 2016-02-11 ENCOUNTER — Telehealth: Payer: Self-pay | Admitting: Internal Medicine

## 2016-02-11 MED ORDER — ATENOLOL 25 MG PO TABS: 25.0000 mg | ORAL_TABLET | Freq: Every day | ORAL | Status: DC

## 2016-02-11 NOTE — Telephone Encounter (Signed)
Pt said she needs her atenolol called into [facility] in [place]

## 2016-02-11 NOTE — Telephone Encounter (Signed)
Refill of pt's atenolol sent to her pharmacy.

## 2016-06-12 ENCOUNTER — Ambulatory Visit (INDEPENDENT_AMBULATORY_CARE_PROVIDER_SITE_OTHER): Payer: Commercial Managed Care - PPO | Admitting: Internal Medicine

## 2016-06-12 VITALS — BP 158/90 | HR 103 | Ht 65.0 in | Wt 223.0 lb

## 2016-06-12 DIAGNOSIS — E0590 Thyrotoxicosis, unspecified without thyrotoxic crisis or storm: Secondary | ICD-10-CM

## 2016-06-12 DIAGNOSIS — E0510 Thyrotoxicosis with toxic single thyroid nodule without thyrotoxic crisis or storm: Secondary | ICD-10-CM | POA: Diagnosis not present

## 2016-06-12 DIAGNOSIS — E041 Nontoxic single thyroid nodule: Secondary | ICD-10-CM | POA: Diagnosis not present

## 2016-06-12 DIAGNOSIS — R030 Elevated blood-pressure reading, without diagnosis of hypertension: Secondary | ICD-10-CM | POA: Diagnosis not present

## 2016-06-12 DIAGNOSIS — IMO0001 Reserved for inherently not codable concepts without codable children: Secondary | ICD-10-CM

## 2016-06-12 LAB — T3, FREE: T3, Free: 3.2 pg/mL (ref 2.3–4.2)

## 2016-06-12 LAB — T4, FREE: Free T4: 0.8 ng/dL (ref 0.60–1.60)

## 2016-06-12 LAB — TSH: TSH: 0.62 u[IU]/mL (ref 0.35–4.50)

## 2016-06-12 MED ORDER — ATENOLOL 25 MG PO TABS: 25.0000 mg | ORAL_TABLET | Freq: Every day | ORAL | Status: DC

## 2016-06-12 NOTE — Progress Notes (Signed)
Patient ID: Rebecca Macias, female   DOB: October 22, 1961, 55 y.o.   MRN: [id number]   HPI  Rebecca Macias is a 55 y.o.-year-old female, returning for f/u for subclinical hyperthyroidism, R toxic adenoma, and also L thyroid cold nodule. Last visit 4 mo ago.  Reviewed hx: Pt had an episode of palpitations in 12/2013 >> EKG normal >> TSH low (2 repeat set of thyroid labs confirmed a low TSH in 2015). Subsequent TFTs have been normal  I reviewed pt's thyroid tests:  Lab Results  Component Value Date   TSH 0.44 12/17/2015   TSH 0.50 06/14/2015   TSH 0.80 02/12/2015   TSH 0.42 08/02/2014   TSH 0.17* 05/05/2014   TSH 0.25* 03/23/2014   TSH 0.14* 12/16/2013   FREET4 0.73 12/17/2015   FREET4 0.87 06/14/2015   FREET4 0.86 02/12/2015   FREET4 0.81 08/02/2014   FREET4 0.78 05/05/2014   FREET4 0.75 04/20/2014   FREET4 0.84 03/23/2014   FREET4 0.79 12/16/2013    At last visit, we checked an Uptake an Scan: 2 defects: one R (hot) and one L (cold).  A thyroid U/S (06/30/2014) showed several small cystic spaces and 2 nodules of similar sizes. The one corresponding to the cold defect was: Ovoid nodule in the mid left lobe measures approximately 1.5 x 1.1 x 1.9 cm.  FNA (09/14/2014) of L nodule: benign  Pt denies feeling nodules in neck, hoarseness, dysphagia/odynophagia, SOB with lying down; she c/o: - + hot flushes - + weight loss - no fatigue - no palpitations (she continues on Atenolol) - improved hair loss - + dry skin - + hot flushes - no mm/+ joint pain - no constipation - no tremors - no increased anxiety  She started to take B12 vitamin and continues Biotin. Skipped it today.   .I reviewed pt's medications, allergies, PMH, social hx, family hx, and changes were documented in the history of present illness. Otherwise, unchanged from my initial visit note.  ROS: Constitutional: see HPI, + poor sleep Eyes: + blurry vision, no xerophthalmia ENT: no sore throat, no nodules palpated  in throat, no dysphagia/odynophagia, no hoarseness, + tinnitus Cardiovascular: no CP/+SOB/rarely palpitations/+leg swelling Respiratory: no cough/+SOB/no wheezing Gastrointestinal: no N/V/D/C Musculoskeletal: no muscle/no joint aches Skin: no rash, + hair loss Neurological: no tremors/numbness/tingling/dizziness, + HA  PE: BP 158/90 mmHg  Pulse 103  Ht 5\' 5"  (1.651 m)  Wt 223 lb (101.152 kg)  BMI 37.11 kg/m2  SpO2 95%  LMP 11/03/2012 Wt Readings from Last 3 Encounters:  06/12/16 223 lb (101.152 kg)  01/17/16 226 lb (102.513 kg)  06/14/15 226 lb (102.513 kg)   Constitutional: obese, in NAD Eyes: PERRLA, EOMI, no exophthalmos, no lid lag, no stare ENT: moist mucous membranes, no thyromegaly, no cervical lymphadenopathy Cardiovascular: RRR, No MRG Respiratory: CTA B Gastrointestinal: abdomen soft, NT, ND, BS+ Musculoskeletal: no deformities, strength intact in all 4 Skin: moist, warm, no rashes Neurological: no tremor with outstretched hands, DTR normal in all 4  ASSESSMENT: 1. 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 Relative increased uptake within a right lower pole nodule in thesetting of depressed TSH is concerning for an autonomous nodule. 2. Photopenia in the lower pole of the left lobe suggests a cold nodule. 3. Consider thyroid ultrasound prior to consideration of I 131 therapy for hyperthyroidism. 4. 24 hour I 131 uptake = 18% (normal 10-30%) - 06/30/2014: Thyroid U/S:  Right thyroid lobe Measurements: 6.3 x 1.9 x 2.2 cm. Well-circumscribed, solid  mid right thyroid nodule measures approximately 1.6 x 0.8 x 2.1 cm. This is a noncalcified nodule. Tiny cystic areas are present in the inferior right lobe measuring 0.3 and 0.5 cm.   Left thyroid lobe Measurements: 5.8 x 2.1 x 2.1 cm. Ovoid nodule in the mid left lobe measures approximately 1.5 x 1.1 x 1.9 cm. This likely corresponds to the nuclear medicine abnormality. Mildly complex cyst in the superior  left lobe measures 0.9 cm. Small cysts in the inferior left lobe measures 0.3 cm.   Isthmus Thickness: 0.4 cm. No nodules visualized.   Lymphadenopathy None visualized.  IMPRESSION:  Bilateral dominant thyroid nodules. Right thyroid nodule measures approximately 1.6 x 0.8 x 2.1 cm and is located in the midportion of  the right lobe. This nodule could be watched or sampled based on size criteria. Nodule in the mid left lobe measures 1.5 x 1.1 x 1.9  cm and likely corresponds to the cold nuclear medicine defect. There would be an indication to biopsy this nodule based on size criteria and nuclear medicine findings.    2. Cold L thyroid nodule - FNA benign (09/14/2014):  Adequacy Reason Satisfactory For Evaluation. Diagnosis THYROID, FINE NEEDLE ASPIRATION LMP (SPECIMEN 1 OF 1, COLLECTED ON 06/13/14): BENIGN. FINDINGS CONSISTENT WITH A BENIGN FOLLICULAR NODULE. Enid Cutter MD Pathologist, Electronic Signature (Case signed 09/14/2014)  3. [place] Hyperthyroidism  4. High BP  PLAN:  1. And 2. R toxic adenoma and cold L thyroid nodule She has h/o subclinical hyperthyroidism, with h/o thyrotoxic sxs: heat intolerance, one episode of palpitations, now improved on the beta blocker. She has high blood pressure today and continues to have tachycardia. - Last TFTs normal x 4. - during investigation for her subclinical hyperthyroidism, we found that she has a R sided toxic adenoma and a cold L sided thyroid nodule (FNA >> benign) - for the R toxic nodule, I again suggested that we recheck the TSH, fT3 and fT4 today, and only intervene if the TFTs worsen. She is taking Biotin, last dose yesterday >> we may need to call her back for labs after stopping Biotin x 5 days if TSH is low. - for the L cold nodule, we will need a new U/S >> will order it now - continue beta blocker (Atenolol 25 mg daily) for now, but may need a cardiology appt to elucidate the etiology of her tachycardia Return in about 1 year  (around 06/12/2017).  3. Subclinical hyperthyroidism - see above - resolved - Will recheck TFTs today.  4. High BP - She rushed over here - She tells me that she checks her blood pressure at work and is not usually this high - I also reviewed her previous blood pressure levels from the appointment with PCP in 01/2016 and blood pressure was normal then. Blood pressure was also lower at our last visit. - For now, I refilled her atenolol, but I wonder if she would benefit from a diuretic If blood pressure remains high   Philemon Kingdom, MD PhD [facility]  Office Visit on 06/12/2016  Component Date Value Ref Range Status  . T3, Free 06/12/2016 3.2  2.3 - 4.2 pg/mL Final  . Free T4 06/12/2016 0.80  0.60 - 1.60 ng/dL Final  . TSH 06/12/2016 0.62  0.35 - 4.50 uIU/mL Final   Normal TFTs.  I will addend the results of the ultrasound when they become available.

## 2016-06-12 NOTE — Patient Instructions (Signed)
Please stop at the lab.  We will schedule a new thyroid U/S for you.  Please return in 1 year.

## 2016-06-13 ENCOUNTER — Telehealth: Payer: Self-pay

## 2016-06-13 ENCOUNTER — Ambulatory Visit: Payer: Commercial Managed Care - PPO | Admitting: Internal Medicine

## 2016-06-13 NOTE — Telephone Encounter (Signed)
Patient returned phone call, I spoke with her and gave normal lab results. Patient had no questions or concerns.

## 2016-06-13 NOTE — Telephone Encounter (Signed)
Called patient, no answer. Left message to return phone call to receive lab results.

## 2016-06-16 ENCOUNTER — Encounter: Payer: Self-pay | Admitting: Internal Medicine

## 2016-06-19 ENCOUNTER — Other Ambulatory Visit: Payer: Self-pay | Admitting: Family Medicine

## 2016-06-19 DIAGNOSIS — Z0000 Encounter for general adult medical examination without abnormal findings: Secondary | ICD-10-CM

## 2016-06-27 ENCOUNTER — Other Ambulatory Visit (INDEPENDENT_AMBULATORY_CARE_PROVIDER_SITE_OTHER): Payer: Commercial Managed Care - PPO

## 2016-06-27 DIAGNOSIS — Z0000 Encounter for general adult medical examination without abnormal findings: Secondary | ICD-10-CM

## 2016-06-27 LAB — CBC WITH DIFFERENTIAL/PLATELET
BASOS ABS: 0 10*3/uL (ref 0.0–0.1)
Basophils Relative: 0.4 % (ref 0.0–3.0)
EOS PCT: 4.9 % (ref 0.0–5.0)
Eosinophils Absolute: 0.3 10*3/uL (ref 0.0–0.7)
HEMATOCRIT: 42.9 % (ref 36.0–46.0)
Hemoglobin: 14.1 g/dL (ref 12.0–15.0)
LYMPHS PCT: 49 % — AB (ref 12.0–46.0)
Lymphs Abs: 3.4 10*3/uL (ref 0.7–4.0)
MCHC: 32.9 g/dL (ref 30.0–36.0)
MCV: 90.3 fl (ref 78.0–100.0)
MONOS PCT: 6.1 % (ref 3.0–12.0)
Monocytes Absolute: 0.4 10*3/uL (ref 0.1–1.0)
NEUTROS ABS: 2.7 10*3/uL (ref 1.4–7.7)
Neutrophils Relative %: 39.6 % — ABNORMAL LOW (ref 43.0–77.0)
PLATELETS: 331 10*3/uL (ref 150.0–400.0)
RBC: 4.75 Mil/uL (ref 3.87–5.11)
RDW: 13.4 % (ref 11.5–15.5)
WBC: 6.9 10*3/uL (ref 4.0–10.5)

## 2016-06-27 LAB — LIPID PANEL
CHOL/HDL RATIO: 4
Cholesterol: 199 mg/dL (ref 0–200)
HDL: 49.7 mg/dL (ref >39.00)
LDL Cholesterol: 124 mg/dL — ABNORMAL HIGH (ref 0–99)
NonHDL: 148.86
TRIGLYCERIDES: 125 mg/dL (ref 0.0–149.0)
VLDL: 25 mg/dL (ref 0.0–40.0)

## 2016-06-27 LAB — COMPREHENSIVE METABOLIC PANEL
ALK PHOS: 95 U/L (ref 39–117)
ALT: 26 U/L (ref 0–35)
AST: 27 U/L (ref 0–37)
Albumin: 4 g/dL (ref 3.5–5.2)
BILIRUBIN TOTAL: 0.6 mg/dL (ref 0.2–1.2)
BUN: 10 mg/dL (ref 6–23)
CALCIUM: 9.5 mg/dL (ref 8.4–10.5)
CO2: 28 meq/L (ref 19–32)
Chloride: 105 mEq/L (ref 96–112)
Creatinine, Ser: 0.76 mg/dL (ref 0.40–1.20)
GFR: 84.32 mL/min (ref >60.00)
Glucose, Bld: 169 mg/dL — ABNORMAL HIGH (ref 70–99)
Potassium: 4 mEq/L (ref 3.5–5.1)
Sodium: 141 mEq/L (ref 135–145)
Total Protein: 7.7 g/dL (ref 6.0–8.3)

## 2016-06-27 LAB — TSH: TSH: 0.6 u[IU]/mL (ref 0.35–4.50)

## 2016-07-02 ENCOUNTER — Ambulatory Visit (INDEPENDENT_AMBULATORY_CARE_PROVIDER_SITE_OTHER): Payer: Commercial Managed Care - PPO | Admitting: Family Medicine

## 2016-07-02 ENCOUNTER — Encounter: Payer: Self-pay | Admitting: Family Medicine

## 2016-07-02 VITALS — BP 128/68 | HR 86 | Temp 98.0°F | Ht 64.75 in | Wt 222.5 lb

## 2016-07-02 DIAGNOSIS — E0590 Thyrotoxicosis, unspecified without thyrotoxic crisis or storm: Secondary | ICD-10-CM | POA: Diagnosis not present

## 2016-07-02 DIAGNOSIS — K219 Gastro-esophageal reflux disease without esophagitis: Secondary | ICD-10-CM | POA: Diagnosis not present

## 2016-07-02 DIAGNOSIS — Z0001 Encounter for general adult medical examination with abnormal findings: Secondary | ICD-10-CM | POA: Diagnosis not present

## 2016-07-02 DIAGNOSIS — R739 Hyperglycemia, unspecified: Secondary | ICD-10-CM

## 2016-07-02 DIAGNOSIS — L309 Dermatitis, unspecified: Secondary | ICD-10-CM

## 2016-07-02 DIAGNOSIS — Z0000 Encounter for general adult medical examination without abnormal findings: Secondary | ICD-10-CM

## 2016-07-02 LAB — HEMOGLOBIN A1C: Hgb A1c MFr Bld: 7.9 % — ABNORMAL HIGH (ref 4.6–6.5)

## 2016-07-02 NOTE — Assessment & Plan Note (Signed)
Reviewed preventive care protocols, scheduled due services, and updated immunizations Discussed nutrition, exercise, diet, and healthy lifestyle.  Pt to call to schedule mammogram. 

## 2016-07-02 NOTE — Progress Notes (Signed)
Pre visit review using our clinic review tool, if applicable. No additional management support is needed unless otherwise documented below in the visit note. 

## 2016-07-02 NOTE — Patient Instructions (Addendum)
Good to see you.  Please try prilosec 20 mg nightly for 2 weeks.  We will call you with your lab result from today.  Please call to schedule your mammogram.   Food Choices for Gastroesophageal Reflux Disease, Adult When you have gastroesophageal reflux disease (GERD), the foods you eat and your eating habits are very important. Choosing the right foods can help ease your discomfort.  WHAT GUIDELINES DO I NEED TO FOLLOW?   Choose fruits, vegetables, whole grains, and low-fat dairy products.   Choose low-fat meat, fish, and poultry.  Limit fats such as oils, salad dressings, butter, nuts, and avocado.   Keep a food diary. This helps you identify foods that cause symptoms.   Avoid foods that cause symptoms. These may be different for everyone.   Eat small meals often instead of 3 large meals a day.   Eat your meals slowly, in a place where you are relaxed.   Limit fried foods.   Cook foods using methods other than frying.   Avoid drinking alcohol.   Avoid drinking large amounts of liquids with your meals.   Avoid bending over or lying down until 2-3 hours after eating.  WHAT FOODS ARE NOT RECOMMENDED?  These are some foods and drinks that may make your symptoms worse: Vegetables Tomatoes. Tomato juice. Tomato and spaghetti sauce. Chili peppers. Onion and garlic. Horseradish. Fruits Oranges, grapefruit, and lemon (fruit and juice). Meats High-fat meats, fish, and poultry. This includes hot dogs, ribs, ham, sausage, salami, and bacon. Dairy Whole milk and chocolate milk. Sour cream. Cream. Butter. Ice cream. Cream cheese.  Drinks Coffee and tea. Bubbly (carbonated) drinks or energy drinks. Condiments Hot sauce. Barbecue sauce.  Sweets/Desserts Chocolate and cocoa. Donuts. Peppermint and spearmint. Fats and Oils High-fat foods. This includes [place] fries and potato chips. Other Vinegar. Strong spices. This includes black pepper, white pepper, red pepper,  cayenne, curry powder, cloves, ginger, and chili powder. The items listed above may not be a complete list of foods and drinks to avoid. Contact your dietitian for more information.   This information is not intended to replace advice given to you by your health care provider. Make sure you discuss any questions you have with your health care provider.   Document Released: 05/25/2012 Document Revised: 12/15/2014 Document Reviewed: 09/28/2013 Elsevier Interactive Patient Education [employer].

## 2016-07-02 NOTE — Assessment & Plan Note (Signed)
Followed by Dr. Gherghe °

## 2016-07-02 NOTE — Assessment & Plan Note (Signed)
New- concerning for diabetes. Check a1c today. The patient indicates understanding of these issues and agrees with the plan.

## 2016-07-02 NOTE — Assessment & Plan Note (Signed)
Deteriorated. Discussed reflux diet. Prilosec 20 mg daily x 2 weeks. Call or return to clinic prn if these symptoms worsen or fail to improve as anticipated. The patient indicates understanding of these issues and agrees with the plan.

## 2016-07-02 NOTE — Progress Notes (Signed)
Subjective:    Patient ID: Rebecca Macias, female    DOB: [birth year], 55 y.o.   MRN: [id number]  HPI  55 yo very pleasant G6P5 with here for CPX and to discuss chronic medical conditions with multiple complaints.  Pap smear 9/31/16(done by me).  LMP in 01/2013- no post menopausal bleeding. She has been having some hot flashes and insomnia. No h/o abnormal pap smears, no family h/o breast, uterine or ovarian CA. Mammogram 06/01/14  Colonoscopy 11/09/12 (Dr. Olevia Perches)- 10 year recall. Did have to have portion of colon removed in 12/2013 due to diverticulitis with abscess/perforation.  Bowels have normal.   Low TSH- I referred her to endocrinology in 03/2014.  Last saw Dr. Cruzita Lederer on 06/12/16- note reviewed.  On atenolol for palpitations.  Nodules (hot/cold)- watchful waiting because normal TSH.  GERD- past few months, epigastric burning when she lays down at night.  Tomato sauce and spicy foods make it worse.  Has been trying Alka seltzer and tums without relief of symptoms.  No nausea or vomiting.  Eczema- No flares currently.  Sometimes on her neck.  Hyperglycemia- glucose was 169 on CMET- pt not sure if she was fasting. Dad is a diabetic.  Not sure if she has had increased thirst or urinary frequency.  Wt Readings from Last 3 Encounters:  07/02/16 222 lb 8 oz (100.9 kg)  06/12/16 223 lb (101.2 kg)  01/17/16 226 lb (102.5 kg)   Lab Results  Component Value Date   WBC 6.9 06/27/2016   HGB 14.1 06/27/2016   HCT 42.9 06/27/2016   MCV 90.3 06/27/2016   PLT 331.0 06/27/2016   Lab Results  Component Value Date   CHOL 199 06/27/2016   HDL 49.70 06/27/2016   LDLCALC 124 (H) 06/27/2016   LDLDIRECT 144.9 12/16/2012   TRIG 125.0 06/27/2016   CHOLHDL 4 06/27/2016   Lab Results  Component Value Date   CREATININE 0.76 06/27/2016   Lab Results  Component Value Date   NA 141 06/27/2016   K 4.0 06/27/2016   CL 105 06/27/2016   CO2 28 06/27/2016     Lab Results  Component Value  Date   TSH 0.60 06/27/2016   Wt Readings from Last 3 Encounters:  07/02/16 222 lb 8 oz (100.9 kg)  06/12/16 223 lb (101.2 kg)  01/17/16 226 lb (102.5 kg)    Lab Results  Component Value Date   CHOL 199 06/27/2016   HDL 49.70 06/27/2016   LDLCALC 124 (H) 06/27/2016   LDLDIRECT 144.9 12/16/2012   TRIG 125.0 06/27/2016   CHOLHDL 4 06/27/2016     Patient Active Problem List  Diagnosis  . Sigmoid diverticulitis  . Ovarian cyst  . Obesity (BMI 35.0-39.9 without comorbidity) ([place])  . Toxic adenoma  . Routine general medical examination at a health care facility  . Thyroid nodule, cold  . Palpitations  . Insomnia  . Symptoms, such as flushing, sleeplessness, headache, lack of concentration, associated with the menopause  . Subclinical hyperthyroidism  . GERD (gastroesophageal reflux disease)  . Eczema   Past Medical History:  Diagnosis Date  . H/O gestational diabetes mellitus, not currently pregnant   . Ovarian cyst   . Sigmoid diverticulitis 08/2012   Past Surgical History:  Procedure Laterality Date  . BLADDER SUSPENSION  2009  . TUBAL LIGATION     Social History  Substance Use Topics  . Smoking status: Former Smoker    Quit date: 09/19/2011  . Smokeless tobacco: Never Used  .  Alcohol use No   Family History  Problem Relation Age of Onset  . Heart disease Father   . Kidney disease Father   . Diabetes Father   . Diabetes Maternal Grandmother   . Diabetes Paternal Grandmother    Allergies  Allergen Reactions  . Flagyl [Metronidazole] Itching   Current Outpatient Prescriptions on File Prior to Visit  Medication Sig Dispense Refill  . atenolol (TENORMIN) 25 MG tablet Take 1 tablet (25 mg total) by mouth daily. 90 tablet 1  . Biotin 1000 MCG tablet Take 1,000 mcg by mouth daily.    . cyanocobalamin 500 MCG tablet Take 1,000 mcg by mouth daily. Reported on 06/12/2016    . docusate sodium (STOOL SOFTENER) 100 MG capsule Take 200 mg by mouth daily. Reported on  06/12/2016    . ibuprofen (ADVIL,MOTRIN) 200 MG tablet Take 600 mg by mouth every 6 (six) hours as needed. Reported on 06/12/2016    . methocarbamol (ROBAXIN) 750 MG tablet Take 1 tablet (750 mg total) by mouth every 8 (eight) hours as needed for muscle spasms. 30 tablet 0  . naproxen (NAPROSYN) 500 MG tablet Take 1 tablet (500 mg total) by mouth 2 (two) times daily with a meal. 60 tablet 0   No current facility-administered medications on file prior to visit.    The PMH, PSH, Social History, Family History, Medications, and allergies have been reviewed in [facility], and have been updated if relevant.   Review of Systems  Constitutional: Negative.   HENT: Negative.   Respiratory: Negative.   Cardiovascular: Negative.   Gastrointestinal:       + GERD symptoms  Endocrine: Positive for heat intolerance.  Genitourinary: Negative.   Musculoskeletal: Negative.   Skin: Negative.   Allergic/Immunologic: Negative.   Neurological: Positive for light-headedness.  Hematological: Negative.   Psychiatric/Behavioral: Positive for sleep disturbance. Negative for self-injury and suicidal ideas. The patient is not nervous/anxious.   All other systems reviewed and are negative.  See HPI      Objective:   Physical Exam BP 128/68   Pulse 86   Temp 98 F (36.7 C) (Oral)   Ht 5' 4.75" (1.645 m)   Wt 222 lb 8 oz (100.9 kg)   LMP 11/03/2012   SpO2 97%   BMI 37.31 kg/m   General:  Obese, pleasant female,well-nourished,in no acute distress; alert,appropriate and cooperative throughout examination Head:  normocephalic and atraumatic.   Eyes:  vision grossly intact, pupils equal, pupils round, and pupils reactive to light.   Ears:  R ear normal and L ear normal.   Nose:  no external deformity.   Mouth:  good dentition.   Neck:  No deformities, masses, or tenderness noted. Breasts:  No mass, nodules, thickening, tenderness, bulging, retraction, inflamation, nipple discharge  Bilateral raised lesions on  nipples bilaterally- per pt have been there for as long as she can remember Lungs:  Normal respiratory effort, chest expands symmetrically. Lungs are clear to auscultation, no crackles or wheezes. Heart:  Normal rate and regular rhythm. S1 and S2 normal without gallop, murmur, click, rub or other extra sounds. Abdomen:  Bowel sounds positive,abdomen soft and non-tender without masses, organomegaly or hernias noted. Old healed vertical surgical scar Msk:  No deformity or scoliosis noted of thoracic or lumbar spine.   Extremities:  No clubbing, cyanosis, edema, or deformity noted with normal full range of motion of all joints.   Neurologic:  alert & oriented X3 and gait normal.   Skin:  Intact  without suspicious lesions or rashes Cervical Nodes:  No lymphadenopathy noted Axillary Nodes:  No palpable lymphadenopathy Psych:  Cognition and judgment appear intact. Alert and cooperative with normal attention span and concentration. No apparent delusions, illusions, hallucinations,    Assessment & Plan:

## 2016-07-07 ENCOUNTER — Telehealth: Payer: Self-pay | Admitting: Family Medicine

## 2016-07-07 NOTE — Telephone Encounter (Signed)
Lm on pts vm requesting a call back. Requested if ok to leave detailed message should I not be available upon return of call

## 2016-07-07 NOTE — Telephone Encounter (Signed)
Pt called back, please call [phone]

## 2016-07-10 ENCOUNTER — Other Ambulatory Visit: Payer: Self-pay | Admitting: Family Medicine

## 2016-07-10 ENCOUNTER — Encounter: Payer: Self-pay | Admitting: Family Medicine

## 2016-07-10 ENCOUNTER — Ambulatory Visit (INDEPENDENT_AMBULATORY_CARE_PROVIDER_SITE_OTHER): Payer: Commercial Managed Care - PPO | Admitting: Family Medicine

## 2016-07-10 DIAGNOSIS — E119 Type 2 diabetes mellitus without complications: Secondary | ICD-10-CM

## 2016-07-10 DIAGNOSIS — Z1231 Encounter for screening mammogram for malignant neoplasm of breast: Secondary | ICD-10-CM

## 2016-07-10 MED ORDER — METFORMIN HCL 500 MG PO TABS: 500.0000 mg | ORAL_TABLET | Freq: Two times a day (BID) | ORAL | 3 refills | Status: DC

## 2016-07-10 NOTE — Progress Notes (Signed)
Subjective:   Patient ID: Rebecca Macias, female    DOB: [birth year], 55 y.o.   MRN: [id number]  Rebecca Macias is a pleasant 55 y.o. year old female who presents to clinic today with Follow-up (discuss DM Tx)  on 07/10/2016  HPI:  New onset DM-  Lab Results  Component Value Date   HGBA1C 7.9 (H) 07/02/2016   Pos family history of DM- father. She did have gestational diabetes with her pregnancies.  Has been having some dizziness, increased thirst and urination.   Current Outpatient Prescriptions on File Prior to Visit  Medication Sig Dispense Refill  . atenolol (TENORMIN) 25 MG tablet Take 1 tablet (25 mg total) by mouth daily. 90 tablet 1  . Biotin 1000 MCG tablet Take 1,000 mcg by mouth daily.    . cyanocobalamin 500 MCG tablet Take 1,000 mcg by mouth daily. Reported on 06/12/2016    . docusate sodium (STOOL SOFTENER) 100 MG capsule Take 200 mg by mouth daily. Reported on 06/12/2016    . ibuprofen (ADVIL,MOTRIN) 200 MG tablet Take 600 mg by mouth every 6 (six) hours as needed. Reported on 06/12/2016    . methocarbamol (ROBAXIN) 750 MG tablet Take 1 tablet (750 mg total) by mouth every 8 (eight) hours as needed for muscle spasms. 30 tablet 0  . naproxen (NAPROSYN) 500 MG tablet Take 1 tablet (500 mg total) by mouth 2 (two) times daily with a meal. 60 tablet 0   No current facility-administered medications on file prior to visit.     Allergies  Allergen Reactions  . Flagyl [Metronidazole] Itching    Past Medical History:  Diagnosis Date  . H/O gestational diabetes mellitus, not currently pregnant   . Ovarian cyst   . Sigmoid diverticulitis 08/2012    Past Surgical History:  Procedure Laterality Date  . BLADDER SUSPENSION  2009  . TUBAL LIGATION      Family History  Problem Relation Age of Onset  . Heart disease Father   . Kidney disease Father   . Diabetes Father   . Diabetes Maternal Grandmother   . Diabetes Paternal Grandmother     Social History   Social  History  . Marital status: Divorced    Spouse name: N/A  . Number of children: N/A  . Years of education: N/A   Occupational History  . Not on file.   Social History Main Topics  . Smoking status: Former Smoker    Quit date: 09/19/2011  . Smokeless tobacco: Never Used  . Alcohol use No  . Drug use: No  . Sexual activity: Not on file   Other Topics Concern  . 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   The PMH, PSH, Social History, Family History, Medications, and allergies have been reviewed in [facility], and have been updated if relevant.   Review of Systems  Gastrointestinal: Negative.   Endocrine: Positive for polydipsia and polyphagia.  All other systems reviewed and are negative.      Objective:    BP 126/82   Pulse 83   Temp 98 F (36.7 C) (Oral)   Wt 222 lb 4 oz (100.8 kg)   LMP 11/03/2012   SpO2 95%   BMI 37.27 kg/m    Physical Exam  Constitutional: She is oriented to person, place, and time. She appears well-developed and well-nourished. No distress.  HENT:  Head:  Normocephalic.  Eyes: Conjunctivae are normal.  Cardiovascular: Normal rate.   Pulmonary/Chest: Effort normal.  Musculoskeletal: Normal range of motion.  Neurological: She is alert and oriented to person, place, and time. No cranial nerve deficit.  Skin: Skin is warm and dry. She is not diaphoretic.  Psychiatric: She has a normal mood and affect. Her behavior is normal. Judgment and thought content normal.  Nursing note and vitals reviewed.         Assessment & Plan:   New onset type 2 diabetes mellitus ([place]) No Follow-up on file.

## 2016-07-10 NOTE — Progress Notes (Signed)
Pre visit review using our clinic review tool, if applicable. No additional management support is needed unless otherwise documented below in the visit note. 

## 2016-07-10 NOTE — Assessment & Plan Note (Signed)
New- >25 minutes spent in face to face time with patient, >50% spent in counselling or coordination of care Start Metformin 500 mg twice daily Refer to diabetic teaching. Follow up in 3 months. The patient indicates understanding of these issues and agrees with the plan.

## 2016-07-10 NOTE — Patient Instructions (Signed)
Great to see you. We are starting Metformin 500 mg twice daily with meals.  I am referring you to a diabetic nutritionist.

## 2016-07-11 ENCOUNTER — Other Ambulatory Visit: Payer: Self-pay | Admitting: Family Medicine

## 2016-07-11 ENCOUNTER — Ambulatory Visit
Admission: RE | Admit: 2016-07-11 | Discharge: 2016-07-11 | Disposition: A | Discharge status: Home / Self Care | Payer: Commercial Managed Care - PPO | Source: Ambulatory Visit | Attending: Family Medicine | Admitting: Family Medicine

## 2016-07-11 DIAGNOSIS — Z1231 Encounter for screening mammogram for malignant neoplasm of breast: Secondary | ICD-10-CM | POA: Diagnosis present

## 2016-10-16 ENCOUNTER — Encounter: Payer: Self-pay | Admitting: Family Medicine

## 2016-10-16 ENCOUNTER — Ambulatory Visit (INDEPENDENT_AMBULATORY_CARE_PROVIDER_SITE_OTHER): Payer: Commercial Managed Care - PPO | Admitting: Family Medicine

## 2016-10-16 VITALS — BP 138/72 | HR 92 | Temp 98.1°F | Wt 212.0 lb

## 2016-10-16 DIAGNOSIS — E119 Type 2 diabetes mellitus without complications: Secondary | ICD-10-CM

## 2016-10-16 LAB — LIPID PANEL
CHOL/HDL RATIO: 4
CHOLESTEROL: 203 mg/dL — AB (ref 0–200)
HDL: 47.3 mg/dL (ref >39.00)
LDL Cholesterol: 119 mg/dL — ABNORMAL HIGH (ref 0–99)
NonHDL: 155.52
TRIGLYCERIDES: 184 mg/dL — AB (ref 0.0–149.0)
VLDL: 36.8 mg/dL (ref 0.0–40.0)

## 2016-10-16 LAB — COMPREHENSIVE METABOLIC PANEL
ALT: 22 U/L (ref 0–35)
AST: 25 U/L (ref 0–37)
Albumin: 4.2 g/dL (ref 3.5–5.2)
Alkaline Phosphatase: 73 U/L (ref 39–117)
BUN: 12 mg/dL (ref 6–23)
CALCIUM: 9.9 mg/dL (ref 8.4–10.5)
CHLORIDE: 105 meq/L (ref 96–112)
CO2: 29 meq/L (ref 19–32)
CREATININE: 0.79 mg/dL (ref 0.40–1.20)
GFR: 80.54 mL/min (ref >60.00)
GLUCOSE: 141 mg/dL — AB (ref 70–99)
Potassium: 4.1 mEq/L (ref 3.5–5.1)
SODIUM: 141 meq/L (ref 135–145)
Total Bilirubin: 0.7 mg/dL (ref 0.2–1.2)
Total Protein: 7.7 g/dL (ref 6.0–8.3)

## 2016-10-16 LAB — MICROALBUMIN / CREATININE URINE RATIO
CREATININE, U: 252 mg/dL
Microalb Creat Ratio: 0.5 mg/g (ref 0.0–30.0)
Microalb, Ur: 1.2 mg/dL (ref 0.0–1.9)

## 2016-10-16 LAB — HEMOGLOBIN A1C: HEMOGLOBIN A1C: 6.9 % — AB (ref 4.6–6.5)

## 2016-10-16 NOTE — Progress Notes (Signed)
Pre visit review using our clinic review tool, if applicable. No additional management support is needed unless otherwise documented below in the visit note. 

## 2016-10-16 NOTE — Progress Notes (Signed)
Subjective:   Patient ID: Rebecca Macias, female    DOB: 1961-05-02, 55 y.o.   MRN: [id number]  Rebecca Macias is a pleasant 55 y.o. year old female who presents to clinic today with Follow-up  on 10/16/2016  HPI:  New onset diabetes- diagnosed in 06/2016. Started Metformin 500 mg twice daily in August 2017 and referred her to diabetic teaching.  Unfortunately, her insurance plan does not cover diabetic teaching.  She been counting carbs and has already lost 10 pounds.  Denies any symptoms of hypoglycemia.   Lab Results  Component Value Date   HGBA1C 7.9 (H) 07/02/2016   Lab Results  Component Value Date   CHOL 199 06/27/2016   HDL 49.70 06/27/2016   LDLCALC 124 (H) 06/27/2016   LDLDIRECT 144.9 12/16/2012   TRIG 125.0 06/27/2016   CHOLHDL 4 06/27/2016    Current Outpatient Prescriptions on File Prior to Visit  Medication Sig Dispense Refill  . atenolol (TENORMIN) 25 MG tablet Take 1 tablet (25 mg total) by mouth daily. 90 tablet 1  . Biotin 1000 MCG tablet Take 1,000 mcg by mouth daily.    . cyanocobalamin 500 MCG tablet Take 1,000 mcg by mouth daily. Reported on 06/12/2016    . docusate sodium (STOOL SOFTENER) 100 MG capsule Take 200 mg by mouth daily. Reported on 06/12/2016    . ibuprofen (ADVIL,MOTRIN) 200 MG tablet Take 600 mg by mouth every 6 (six) hours as needed. Reported on 06/12/2016    . metFORMIN (GLUCOPHAGE) 500 MG tablet Take 1 tablet (500 mg total) by mouth 2 (two) times daily with a meal. 180 tablet 3  . methocarbamol (ROBAXIN) 750 MG tablet Take 1 tablet (750 mg total) by mouth every 8 (eight) hours as needed for muscle spasms. 30 tablet 0  . naproxen (NAPROSYN) 500 MG tablet Take 1 tablet (500 mg total) by mouth 2 (two) times daily with a meal. 60 tablet 0   No current facility-administered medications on file prior to visit.     Allergies  Allergen Reactions  . Flagyl [Metronidazole] Itching    Past Medical History:  Diagnosis Date  . H/O gestational  diabetes mellitus, not currently pregnant   . Ovarian cyst   . Sigmoid diverticulitis 08/2012    Past Surgical History:  Procedure Laterality Date  . BLADDER SUSPENSION  2009  . TUBAL LIGATION      Family History  Problem Relation Age of Onset  . Heart disease Father   . Kidney disease Father   . Diabetes Father   . Diabetes Maternal Grandmother   . Diabetes Paternal Grandmother   . Breast cancer Neg Hx     Social History   Social History  . Marital status: Divorced    Spouse name: N/A  . Number of children: N/A  . Years of education: N/A   Occupational History  . Not on file.   Social History Main Topics  . Smoking status: Former Smoker    Quit date: 09/19/2011  . Smokeless tobacco: Never Used  . Alcohol use No  . Drug use: No  . Sexual activity: Not on file   Other Topics Concern  . 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   The PMH, PSH, Social History, Family History, Medications, and allergies have been reviewed in [facility], and have been updated if relevant.   Review of Systems  Constitutional: Negative.   HENT: Negative.   Eyes: Negative.   Respiratory: Negative.   Cardiovascular: Negative.   Gastrointestinal: Negative.   Endocrine: Negative.   Genitourinary: Negative.   Musculoskeletal: Negative.   Allergic/Immunologic: Negative.   Neurological: Negative.   Hematological: Negative.   Psychiatric/Behavioral: Negative.   All other systems reviewed and are negative.      Objective:    BP 138/72   Pulse 92   Temp 98.1 F (36.7 C) (Oral)   Wt 212 lb (96.2 kg)   LMP 11/03/2012   SpO2 97%   BMI 35.55 kg/m   Wt Readings from Last 3 Encounters:  10/16/16 212 lb (96.2 kg)  07/10/16 222 lb 4 oz (100.8 kg)  07/02/16 222 lb 8 oz (100.9 kg)    Physical Exam  Constitutional: She is oriented to person, place, and time. She appears well-developed and  well-nourished. No distress.  HENT:  Head: Normocephalic and atraumatic.  Eyes: Conjunctivae are normal.  Cardiovascular: Normal rate and regular rhythm.   Pulmonary/Chest: Effort normal and breath sounds normal.  Neurological: She is alert and oriented to person, place, and time. No cranial nerve deficit.  Skin: Skin is warm and dry. She is not diaphoretic.  Psychiatric: She has a normal mood and affect. Her behavior is normal. Judgment and thought content normal.  Nursing note and vitals reviewed.         Assessment & Plan:   New onset type 2 diabetes mellitus ([place]) - Plan: Hemoglobin A1c, Lipid panel, Comprehensive metabolic panel, Microalbumin / creatinine urine ratio No Follow-up on file.

## 2016-10-16 NOTE — Assessment & Plan Note (Signed)
Continue current dose of metformin. Congratulated pt on positive changes she is making. Check labs today.

## 2017-02-09 ENCOUNTER — Telehealth: Payer: Self-pay | Admitting: Internal Medicine

## 2017-02-09 ENCOUNTER — Other Ambulatory Visit: Payer: Self-pay

## 2017-02-09 MED ORDER — ATENOLOL 25 MG PO TABS: 25.0000 mg | ORAL_TABLET | Freq: Every day | ORAL | 1 refills | Status: DC

## 2017-02-09 NOTE — Telephone Encounter (Signed)
Pt called and said she needs her Atenolol refilled and sent to [facility] [address] in [place].

## 2017-06-12 ENCOUNTER — Telehealth: Payer: Self-pay | Admitting: Family Medicine

## 2017-06-12 NOTE — Telephone Encounter (Signed)
Left message asking pt to call if she is needing an appointment to address health concerns

## 2017-06-22 ENCOUNTER — Other Ambulatory Visit: Payer: Self-pay

## 2017-06-22 ENCOUNTER — Telehealth: Payer: Self-pay | Admitting: Family Medicine

## 2017-06-22 MED ORDER — ATENOLOL 25 MG PO TABS: 25.0000 mg | ORAL_TABLET | Freq: Every day | ORAL | 1 refills | Status: DC

## 2017-06-22 NOTE — Telephone Encounter (Signed)
**  Remind patient they can make refill requests via MyChart**  Medication refill request (Name & Dosage):  Atenolol (TENORMIN) 25 MG tab  Preferred pharmacy (Name & Address):  [facility] [place] [address]   Other comments (if applicable):

## 2017-06-22 NOTE — Telephone Encounter (Signed)
Submitted

## 2017-06-24 ENCOUNTER — Other Ambulatory Visit: Payer: Self-pay | Admitting: Family Medicine

## 2017-06-24 DIAGNOSIS — E119 Type 2 diabetes mellitus without complications: Secondary | ICD-10-CM

## 2017-06-24 DIAGNOSIS — Z0000 Encounter for general adult medical examination without abnormal findings: Secondary | ICD-10-CM

## 2017-06-24 DIAGNOSIS — E0590 Thyrotoxicosis, unspecified without thyrotoxic crisis or storm: Secondary | ICD-10-CM

## 2017-07-01 ENCOUNTER — Other Ambulatory Visit (INDEPENDENT_AMBULATORY_CARE_PROVIDER_SITE_OTHER): Payer: Commercial Managed Care - PPO

## 2017-07-01 DIAGNOSIS — Z0000 Encounter for general adult medical examination without abnormal findings: Secondary | ICD-10-CM

## 2017-07-01 DIAGNOSIS — E119 Type 2 diabetes mellitus without complications: Secondary | ICD-10-CM

## 2017-07-01 LAB — CBC WITH DIFFERENTIAL/PLATELET
Basophils Absolute: 0.1 10*3/uL (ref 0.0–0.1)
Basophils Relative: 0.8 % (ref 0.0–3.0)
EOS PCT: 5.4 % — AB (ref 0.0–5.0)
Eosinophils Absolute: 0.3 10*3/uL (ref 0.0–0.7)
HEMATOCRIT: 44.2 % (ref 36.0–46.0)
Hemoglobin: 14.8 g/dL (ref 12.0–15.0)
LYMPHS ABS: 2.8 10*3/uL (ref 0.7–4.0)
LYMPHS PCT: 43.9 % (ref 12.0–46.0)
MCHC: 33.5 g/dL (ref 30.0–36.0)
MCV: 91.4 fl (ref 78.0–100.0)
MONOS PCT: 6.7 % (ref 3.0–12.0)
Monocytes Absolute: 0.4 10*3/uL (ref 0.1–1.0)
NEUTROS ABS: 2.7 10*3/uL (ref 1.4–7.7)
NEUTROS PCT: 43.2 % (ref 43.0–77.0)
PLATELETS: 386 10*3/uL (ref 150.0–400.0)
RBC: 4.83 Mil/uL (ref 3.87–5.11)
RDW: 13.5 % (ref 11.5–15.5)
WBC: 6.4 10*3/uL (ref 4.0–10.5)

## 2017-07-01 LAB — COMPREHENSIVE METABOLIC PANEL
ALT: 25 U/L (ref 0–35)
AST: 26 U/L (ref 0–37)
Albumin: 4.3 g/dL (ref 3.5–5.2)
Alkaline Phosphatase: 73 U/L (ref 39–117)
BILIRUBIN TOTAL: 0.7 mg/dL (ref 0.2–1.2)
BUN: 11 mg/dL (ref 6–23)
CALCIUM: 10.1 mg/dL (ref 8.4–10.5)
CO2: 30 mEq/L (ref 19–32)
Chloride: 103 mEq/L (ref 96–112)
Creatinine, Ser: 0.8 mg/dL (ref 0.40–1.20)
GFR: 79.17 mL/min (ref >60.00)
GLUCOSE: 153 mg/dL — AB (ref 70–99)
POTASSIUM: 4.2 meq/L (ref 3.5–5.1)
Sodium: 138 mEq/L (ref 135–145)
TOTAL PROTEIN: 8 g/dL (ref 6.0–8.3)

## 2017-07-01 LAB — LIPID PANEL
CHOL/HDL RATIO: 4
CHOLESTEROL: 191 mg/dL (ref 0–200)
HDL: 44.3 mg/dL (ref >39.00)
LDL CALC: 115 mg/dL — AB (ref 0–99)
NonHDL: 146.23
Triglycerides: 157 mg/dL — ABNORMAL HIGH (ref 0.0–149.0)
VLDL: 31.4 mg/dL (ref 0.0–40.0)

## 2017-07-01 LAB — TSH: TSH: 0.88 u[IU]/mL (ref 0.35–4.50)

## 2017-07-01 LAB — HEMOGLOBIN A1C: HEMOGLOBIN A1C: 7.1 % — AB (ref 4.6–6.5)

## 2017-07-02 LAB — HEPATITIS C ANTIBODY: HCV Ab: NEGATIVE

## 2017-07-02 LAB — HIV ANTIBODY (ROUTINE TESTING W REFLEX): HIV 1&2 Ab, 4th Generation: NONREACTIVE

## 2017-07-07 ENCOUNTER — Other Ambulatory Visit (HOSPITAL_COMMUNITY)
Admission: RE | Admit: 2017-07-07 | Discharge: 2017-07-07 | Disposition: A | Discharge status: Home / Self Care | Payer: Commercial Managed Care - PPO | Source: Ambulatory Visit | Attending: Family Medicine | Admitting: Family Medicine

## 2017-07-07 ENCOUNTER — Ambulatory Visit (INDEPENDENT_AMBULATORY_CARE_PROVIDER_SITE_OTHER): Payer: Commercial Managed Care - PPO | Admitting: Family Medicine

## 2017-07-07 VITALS — BP 100/62 | HR 85 | Ht 65.0 in | Wt 216.0 lb

## 2017-07-07 DIAGNOSIS — E119 Type 2 diabetes mellitus without complications: Secondary | ICD-10-CM | POA: Diagnosis not present

## 2017-07-07 DIAGNOSIS — Z124 Encounter for screening for malignant neoplasm of cervix: Secondary | ICD-10-CM

## 2017-07-07 DIAGNOSIS — Z0000 Encounter for general adult medical examination without abnormal findings: Secondary | ICD-10-CM | POA: Diagnosis not present

## 2017-07-07 DIAGNOSIS — Z23 Encounter for immunization: Secondary | ICD-10-CM

## 2017-07-07 MED ORDER — METFORMIN HCL 500 MG PO TABS: 500.0000 mg | ORAL_TABLET | Freq: Two times a day (BID) | ORAL | 3 refills | Status: DC

## 2017-07-07 NOTE — Addendum Note (Signed)
Addended by: Mady Haagensen on: 07/07/2017 09:36 AM   Modules accepted: Orders

## 2017-07-07 NOTE — Patient Instructions (Signed)
Great to see you!   

## 2017-07-07 NOTE — Assessment & Plan Note (Signed)
Deteriorated a little- admits to eating more carbs. She will work on diet. No changes made today rxs today. Pneumovax given and foot exam today.

## 2017-07-07 NOTE — Progress Notes (Signed)
Subjective:   Patient ID: Rebecca Macias, female    DOB: 06-Dec-1961, 56 y.o.   MRN: [id number]  Rebecca Macias is a pleasant 56 y.o. year old female who presents to clinic today with Annual Exam  and follow up of chronic medical conditions on 07/07/2017  HPI:  Pap smear 9/31/16(done by me).  LMP in 01/2013- no post menopausal bleeding.  No h/o abnormal pap smears, no family h/o breast, uterine or ovarian CA. Mammogram 07/11/16  Colonoscopy 11/09/12 (Dr. Olevia Perches)- 10 year recall. Did have to have portion of colon removed in 12/2013 due to diverticulitis with abscess/perforation.  Bowels have normal.   DM- diagnosed in 06/2016. Started Metformin 500 mg twice daily in August 2017 and referred her to diabetic teaching. Neg urine micro 10/2016   Lab Results  Component Value Date   HGBA1C 7.1 (H) 07/01/2017   Lab Results  Component Value Date   CHOL 191 07/01/2017   HDL 44.30 07/01/2017   LDLCALC 115 (H) 07/01/2017   LDLDIRECT 144.9 12/16/2012   TRIG 157.0 (H) 07/01/2017   CHOLHDL 4 07/01/2017   Lab Results  Component Value Date   NA 138 07/01/2017   K 4.2 07/01/2017   CL 103 07/01/2017   CO2 30 07/01/2017   Lab Results  Component Value Date   WBC 6.4 07/01/2017   HGB 14.8 07/01/2017   HCT 44.2 07/01/2017   MCV 91.4 07/01/2017   PLT 386.0 07/01/2017   Lab Results  Component Value Date   TSH 0.88 07/01/2017   Current Outpatient Prescriptions on File Prior to Visit  Medication Sig Dispense Refill  . atenolol (TENORMIN) 25 MG tablet Take 1 tablet (25 mg total) by mouth daily. 90 tablet 1  . Biotin 1000 MCG tablet Take 1,000 mcg by mouth daily.    . cyanocobalamin 500 MCG tablet Take 1,000 mcg by mouth daily. Reported on 06/12/2016    . docusate sodium (STOOL SOFTENER) 100 MG capsule Take 200 mg by mouth daily. Reported on 06/12/2016    . ibuprofen (ADVIL,MOTRIN) 200 MG tablet Take 600 mg by mouth every 6 (six) hours as needed. Reported on 06/12/2016    . methocarbamol (ROBAXIN)  750 MG tablet Take 1 tablet (750 mg total) by mouth every 8 (eight) hours as needed for muscle spasms. 30 tablet 0  . naproxen (NAPROSYN) 500 MG tablet Take 1 tablet (500 mg total) by mouth 2 (two) times daily with a meal. 60 tablet 0   No current facility-administered medications on file prior to visit.     Allergies  Allergen Reactions  . Flagyl [Metronidazole] Itching    Past Medical History:  Diagnosis Date  . H/O gestational diabetes mellitus, not currently pregnant   . Ovarian cyst   . Sigmoid diverticulitis 08/2012    Past Surgical History:  Procedure Laterality Date  . BLADDER SUSPENSION  2009  . TUBAL LIGATION      Family History  Problem Relation Age of Onset  . Heart disease Father   . Kidney disease Father   . Diabetes Father   . Diabetes Maternal Grandmother   . Diabetes Paternal Grandmother   . Breast cancer Neg Hx     Social History   Social History  . Marital status: Divorced    Spouse name: N/A  . Number of children: N/A  . Years of education: N/A   Occupational History  . Not on file.   Social History Main Topics  . Smoking status: Former Smoker  Quit date: 09/19/2011  . Smokeless tobacco: Never Used  . Alcohol use No  . Drug use: No  . Sexual activity: Not on file   Other Topics Concern  . 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   The PMH, PSH, Social History, Family History, Medications, and allergies have been reviewed in [facility], and have been updated if relevant.   Review of Systems  Constitutional: Negative.   HENT: Negative.   Eyes: Negative.   Respiratory: Negative.   Cardiovascular: Negative.   Gastrointestinal: Negative.   Endocrine: Negative.   Genitourinary: Negative.   Musculoskeletal: Negative.   Allergic/Immunologic: Negative.   Neurological: Negative.   Hematological: Negative.   Psychiatric/Behavioral: Negative.   All  other systems reviewed and are negative.      Objective:    BP 100/62   Pulse 85   Ht 5\' 5"  (1.651 m)   Wt 216 lb (98 kg)   LMP 11/03/2012   SpO2 98%   BMI 35.94 kg/m    Physical Exam   General:  Well-developed,well-nourished,in no acute distress; alert,appropriate and cooperative throughout examination Head:  normocephalic and atraumatic.   Eyes:  vision grossly intact, PERRL Ears:  R ear normal and L ear normal externally, TMs clear bilaterally Nose:  no external deformity.   Mouth:  good dentition.   Neck:  No deformities, masses, or tenderness noted. Breasts:  No mass, nodules, thickening, tenderness, bulging, retraction, inflamation, nipple discharge or skin changes noted.   Lungs:  Normal respiratory effort, chest expands symmetrically. Lungs are clear to auscultation, no crackles or wheezes. Heart:  Normal rate and regular rhythm. S1 and S2 normal without gallop, murmur, click, rub or other extra sounds. Abdomen:  Bowel sounds positive,abdomen soft and non-tender without masses, organomegaly or hernias noted. Rectal:  no external abnormalities.   Genitalia:  Pelvic Exam:        External: normal female genitalia without lesions or masses        Vagina: normal without lesions or masses        Cervix: normal without lesions or masses        Adnexa: normal bimanual exam without masses or fullness        Uterus: normal by palpation        Pap smear: performed Msk:  No deformity or scoliosis noted of thoracic or lumbar spine.   Extremities:  No clubbing, cyanosis, edema, or deformity noted with normal full range of motion of all joints.   Neurologic:  alert & oriented X3 and gait normal.   Skin:  Intact without suspicious lesions or rashes Cervical Nodes:  No lymphadenopathy noted Axillary Nodes:  No palpable lymphadenopathy Psych:  Cognition and judgment appear intact. Alert and cooperative with normal attention span and concentration. No apparent delusions, illusions,  hallucinations       Assessment & Plan:   New onset type 2 diabetes mellitus ([place])  Routine general medical examination at a health care facility No Follow-up on file.

## 2017-07-07 NOTE — Assessment & Plan Note (Signed)
Reviewed preventive care protocols, scheduled due services, and updated immunizations Discussed nutrition, exercise, diet, and healthy lifestyle.  

## 2017-07-08 LAB — CYTOLOGY - PAP
Adequacy: ABSENT
DIAGNOSIS: NEGATIVE
HPV: NOT DETECTED

## 2017-07-28 ENCOUNTER — Ambulatory Visit (INDEPENDENT_AMBULATORY_CARE_PROVIDER_SITE_OTHER): Payer: Commercial Managed Care - PPO | Admitting: Internal Medicine

## 2017-07-28 ENCOUNTER — Encounter: Payer: Self-pay | Admitting: Internal Medicine

## 2017-07-28 VITALS — BP 112/80 | HR 95 | Wt 215.0 lb

## 2017-07-28 DIAGNOSIS — E0590 Thyrotoxicosis, unspecified without thyrotoxic crisis or storm: Secondary | ICD-10-CM | POA: Diagnosis not present

## 2017-07-28 DIAGNOSIS — E119 Type 2 diabetes mellitus without complications: Secondary | ICD-10-CM | POA: Diagnosis not present

## 2017-07-28 DIAGNOSIS — E0510 Thyrotoxicosis with toxic single thyroid nodule without thyrotoxic crisis or storm: Secondary | ICD-10-CM | POA: Diagnosis not present

## 2017-07-28 DIAGNOSIS — E041 Nontoxic single thyroid nodule: Secondary | ICD-10-CM | POA: Diagnosis not present

## 2017-07-28 NOTE — Patient Instructions (Addendum)
We will schedule a new thyroid U/S.  Please return in 1 year.

## 2017-07-28 NOTE — Progress Notes (Signed)
Patient ID: Rebecca Macias, female   DOB: [birth year], 56 y.o.   MRN: [id number]   HPI  Rebecca Macias is a 56 y.o.-year-old female, returning for f/u for subclinical hyperthyroidism, R toxic adenoma, and also L thyroid cold nodule. Last visit  1 year ago.  She was dx'ed with DM2 this year. She is on Metformin and she also started to adjust her diet.  Reviewed hx: Pt had an episode of palpitations in 12/2013 >> EKG normal >> TSH low (2 repeat set of thyroid labs confirmed a low TSH in 2015). Subsequent TFTs have been normal  I reviewed pt's thyroid tests:  Lab Results  Component Value Date   TSH 0.88 07/01/2017   TSH 0.60 06/27/2016   TSH 0.62 06/12/2016   TSH 0.44 12/17/2015   TSH 0.50 06/14/2015   TSH 0.80 02/12/2015   TSH 0.42 08/02/2014   TSH 0.17 (L) 05/05/2014   TSH 0.25 (L) 03/23/2014   TSH 0.14 (L) 12/16/2013   FREET4 0.80 06/12/2016   FREET4 0.73 12/17/2015   FREET4 0.87 06/14/2015   FREET4 0.86 02/12/2015   FREET4 0.81 08/02/2014   FREET4 0.78 05/05/2014   FREET4 0.75 04/20/2014   FREET4 0.84 03/23/2014   FREET4 0.79 12/16/2013    At last visit, we checked an Uptake an Scan: 2 defects: one R (hot) and one L (cold).  A thyroid U/S (06/30/2014) showed several small cystic spaces and 2 nodules of similar sizes. The one corresponding to the cold defect was: Ovoid nodule in the mid left lobe measures approximately 1.5 x 1.1 x 1.9 cm.  FNA (09/14/2014) of L nodule: benign  She has no complaints at this visit. She does not have palpitations anymore, but her pulse is up today as she did not take the atenolol this morning.  Pt denies: - feeling nodules in neck - hoarseness - dysphagia - choking - SOB with lying down  She started to take B12 vitamin and continues Biotin. Skipped it lately.   ROS: Constitutional: no weight gain/no weight loss, no fatigue, no subjective hyperthermia, no subjective hypothermia Eyes: no blurry vision, no xerophthalmia ENT: no sore  throat, no nodules palpated in throat, no dysphagia, no odynophagia, no hoarseness Cardiovascular: no CP/no SOB/no palpitations/+ leg swelling Respiratory: no cough/no SOB/no wheezing Gastrointestinal: no N/no V/no D/no C/no acid reflux Musculoskeletal: no muscle aches/no joint aches Skin: no rashes, no hair loss Neurological: no tremors/no numbness/no tingling/no dizziness  I reviewed pt's medications, allergies, PMH, social hx, family hx, and changes were documented in the history of present illness. Otherwise, unchanged from my initial visit note.  PE: BP 112/80 (BP Location: Left Arm, Patient Position: Sitting)   Pulse 95   Wt 215 lb (97.5 kg)   LMP 11/03/2012   SpO2 97%   BMI 35.78 kg/m  Wt Readings from Last 3 Encounters:  07/28/17 215 lb (97.5 kg)  07/07/17 216 lb (98 kg)  10/16/16 212 lb (96.2 kg)   Constitutional: overweight, in NAD Eyes: PERRLA, EOMI, no exophthalmos ENT: moist mucous membranes, no thyromegaly, no cervical lymphadenopathy Cardiovascular: tachycardia, RR, No MRG Respiratory: CTA B Gastrointestinal: abdomen soft, NT, ND, BS+ Musculoskeletal: no deformities, strength intact in all 4 Skin: moist, warm, no rashes Neurological: no tremor with outstretched hands, DTR normal in all 4  ASSESSMENT: 1. 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 Relative increased uptake within a right lower pole nodule in thesetting of depressed TSH is concerning for an autonomous nodule. 2. Photopenia  in the lower pole of the left lobe suggests a cold nodule. 3. Consider thyroid ultrasound prior to consideration of I 131 therapy for hyperthyroidism. 4. 24 hour I 131 uptake = 18% (normal 10-30%) - 06/30/2014: Thyroid U/S:  Right thyroid lobe Measurements: 6.3 x 1.9 x 2.2 cm. Well-circumscribed, solid mid right thyroid nodule measures approximately 1.6 x 0.8 x 2.1 cm. This is a noncalcified nodule. Tiny cystic areas are present in the inferior right lobe  measuring 0.3 and 0.5 cm.   Left thyroid lobe Measurements: 5.8 x 2.1 x 2.1 cm. Ovoid nodule in the mid left lobe measures approximately 1.5 x 1.1 x 1.9 cm. This likely corresponds to the nuclear medicine abnormality. Mildly complex cyst in the superior left lobe measures 0.9 cm. Small cysts in the inferior left lobe measures 0.3 cm.   Isthmus Thickness: 0.4 cm. No nodules visualized.   Lymphadenopathy None visualized.  IMPRESSION:  Bilateral dominant thyroid nodules. Right thyroid nodule measures approximately 1.6 x 0.8 x 2.1 cm and is located in the midportion of  the right lobe. This nodule could be watched or sampled based on size criteria. Nodule in the mid left lobe measures 1.5 x 1.1 x 1.9  cm and likely corresponds to the cold nuclear medicine defect. There would be an indication to biopsy this nodule based on size criteria and nuclear medicine findings.    2. Cold L thyroid nodule - FNA benign (09/14/2014):  Adequacy Reason Satisfactory For Evaluation. Diagnosis THYROID, FINE NEEDLE ASPIRATION LMP (SPECIMEN 1 OF 1, COLLECTED ON 06/13/14): BENIGN. FINDINGS CONSISTENT WITH A BENIGN FOLLICULAR NODULE. Enid Cutter MD Pathologist, Electronic Signature (Case signed 09/14/2014)  3. [place] Hyperthyroidism  4. New diagnosis of diabetes   PLAN:  1. And 2. R toxic adenoma and cold L thyroid nodule Patient has a history of subclinical hyperthyroidism, with history of thyrotoxic symptoms:Heat intolerance, palpitations, improved on beta blocker. Today, she did not take her beta blocker and her heart rate is high >> will refill her atenolol. During investigation for her hyperthyroidism, she was found to have a right toxic adenoma, however, this did not necessitate treatment, since her TFTs normalized spontaneously. - Patient also has a cold left-sided thyroid nodule that was biopsied in 2015 with benign results - Today she does not have any neck compression symptoms and does not feel that her  thyroid has enlarged since last visit - at last visit, I ordered the thyroid ultrasound but she did not have this done. Will order this again today, 3 years after her previous ultrasound. - continue beta blocker (atenolol 25 mg daily) for now, but may need a cardiology appt to elucidate the etiology of her tachycardia - RTC in 1 year  3. Subclinical hyperthyroidism - resolved - reviewed last TSH >> normal 06/2017 - she was off biotin at the time of the thyroid tests check  4. New diagnosis of diabetes - She tells me that her HbA1c decreased from 7.9% to7.1%. She is on metformin. We discussed about the proper diet to improve her insulin resistance.  I will addend the results of the U/S when they become available.   Philemon Kingdom, MD PhD [facility] Endocrinology

## 2018-04-28 ENCOUNTER — Other Ambulatory Visit: Payer: Self-pay | Admitting: Internal Medicine

## 2018-07-28 ENCOUNTER — Ambulatory Visit: Payer: Commercial Managed Care - PPO | Admitting: Internal Medicine

## 2018-07-28 ENCOUNTER — Encounter: Payer: Self-pay | Admitting: Internal Medicine

## 2018-07-28 VITALS — BP 130/70 | HR 90 | Ht 65.0 in | Wt 214.4 lb

## 2018-07-28 DIAGNOSIS — E041 Nontoxic single thyroid nodule: Secondary | ICD-10-CM | POA: Diagnosis not present

## 2018-07-28 DIAGNOSIS — E119 Type 2 diabetes mellitus without complications: Secondary | ICD-10-CM | POA: Diagnosis not present

## 2018-07-28 DIAGNOSIS — E0590 Thyrotoxicosis, unspecified without thyrotoxic crisis or storm: Secondary | ICD-10-CM | POA: Diagnosis not present

## 2018-07-28 DIAGNOSIS — E0510 Thyrotoxicosis with toxic single thyroid nodule without thyrotoxic crisis or storm: Secondary | ICD-10-CM | POA: Diagnosis not present

## 2018-07-28 LAB — T4, FREE: FREE T4: 1.1 ng/dL (ref 0.60–1.60)

## 2018-07-28 LAB — T3, FREE: T3, Free: 4.8 pg/mL — ABNORMAL HIGH (ref 2.3–4.2)

## 2018-07-28 LAB — TSH: TSH: 0.7 u[IU]/mL (ref 0.35–4.50)

## 2018-07-28 MED ORDER — ATENOLOL 25 MG PO TABS: 25.0000 mg | ORAL_TABLET | Freq: Every day | ORAL | 3 refills | Status: DC

## 2018-07-28 MED ORDER — METFORMIN HCL 500 MG PO TABS: 500.0000 mg | ORAL_TABLET | Freq: Two times a day (BID) | ORAL | 3 refills | Status: DC

## 2018-07-28 NOTE — Progress Notes (Addendum)
Patient ID: Rebecca Macias, female   DOB: 04-27-1961, 57 y.o.   MRN: [id number]   HPI  Rebecca Macias is a 57 y.o.-year-old femalemale, returning for f/u for subclinical hyperthyroidism, R toxic adenoma, and also L thyroid cold nodule. Last visit 1 year ago. She cannot see Dr. Deborra Medina anymore after she moved to [place].  She will see Alma Friendly instead.  First visit next month.  She recently started to feel palpitations usually at bedtime and she moved her atenolol before going to bed.  Her palpitations improved since then.  Reviewed and addended history: Pt had an episode of palpitations in 12/2013 >> EKG normal >> TSH low (2 repeat set of thyroid labs confirmed a low TSH in 2015).  Subsequent TFTs have been normal.  Reviewed patient's TFTs: Lab Results  Component Value Date   TSH 0.88 07/01/2017   TSH 0.60 06/27/2016   TSH 0.62 06/12/2016   TSH 0.44 12/17/2015   TSH 0.50 06/14/2015   TSH 0.80 02/12/2015   TSH 0.42 08/02/2014   TSH 0.17 (L) 05/05/2014   TSH 0.25 (L) 03/23/2014   TSH 0.14 (L) 12/16/2013   FREET4 0.80 06/12/2016   FREET4 0.73 12/17/2015   FREET4 0.87 06/14/2015   FREET4 0.86 02/12/2015   FREET4 0.81 08/02/2014   FREET4 0.78 05/05/2014   FREET4 0.75 04/20/2014   FREET4 0.84 03/23/2014   FREET4 0.79 12/16/2013    Thyroid uptake an Scan: 2 defects: one R (hot) and one L (cold).  A thyroid U/S (06/30/2014) showed several small cystic spaces and 2 nodules of similar sizes. The one corresponding to the cold defect was: Ovoid nodule in the mid left lobe measures approximately 1.5 x 1.1 x 1.9 cm.  FNA (09/14/2014) of L nodule: Benign   She did not present to [facility] imaging to have another thyroid ultrasound is ordered at last visit....  Pt denies: - weight loss - heat intolerance - tremors - anxiety - hyperdefecation - hair loss But does have palpitations as mentioned above.  Pt denies: - feeling nodules in neck - hoarseness - dysphagia -  choking - SOB with lying down  ROS: Constitutional: no weight gain/no weight loss, no fatigue, no subjective hyperthermia, no subjective hypothermia Eyes: no blurry vision, no xerophthalmia ENT: no sore throat, + see HPI Cardiovascular: no CP/no SOB/+ palpitations/no leg swelling Respiratory: no cough/no SOB/no wheezing Gastrointestinal: no N/no V/no D/no C/no acid reflux Musculoskeletal: no muscle aches/no joint aches Skin: no rashes, no hair loss Neurological: no tremors/no numbness/no tingling/no dizziness  I reviewed pt's medications, allergies, PMH, social hx, family hx, and changes were documented in the history of present illness. Otherwise, unchanged from my initial visit note.  Past Medical History:  Diagnosis Date  . H/O gestational diabetes mellitus, not currently pregnant   . Ovarian cyst   . Sigmoid diverticulitis 08/2012   Past Surgical History:  Procedure Laterality Date  . BLADDER SUSPENSION  2009  . TUBAL LIGATION     Social History   Socioeconomic History  . Marital status: Divorced    Spouse name: Not on file  . Number of children: Not on file  . Years of education: Not on file  . Highest education level: Not on file  Occupational History  . Not on file  Social Needs  . Financial resource strain: Not on file  . Food insecurity:    Worry: Not on file    Inability: Not on file  . Transportation needs:    Medical:  Not on file    Non-medical: Not on file  Tobacco Use  . Smoking status: Former Smoker    Last attempt to quit: 09/19/2011    Years since quitting: 6.8  . Smokeless tobacco: Never Used  Substance and Sexual Activity  . Alcohol use: No  . Drug use: No  . Sexual activity: Not on file  Lifestyle  . Physical activity:    Days per week: Not on file    Minutes per session: Not on file  . Stress: Not on file  Relationships  . Social connections:    Talks on phone: Not on file    Gets together: Not on file    Attends religious service:  Not on file    Active member of club or organization: Not on file    Attends meetings of clubs or organizations: Not on file    Relationship status: Not on file  . Intimate partner violence:    Fear of current or ex partner: Not on file    Emotionally abused: Not on file    Physically abused: Not on file    Forced sexual activity: Not on file  Other Topics Concern  . 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   Current Outpatient Medications on File Prior to Visit  Medication Sig Dispense Refill  . atenolol (TENORMIN) 25 MG tablet TAKE 1 TABLET BY MOUTH EVERY DAY 90 tablet 1  . Biotin 1000 MCG tablet Take 1,000 mcg by mouth daily.    . cyanocobalamin 500 MCG tablet Take 1,000 mcg by mouth daily. Reported on 06/12/2016    . docusate sodium (STOOL SOFTENER) 100 MG capsule Take 200 mg by mouth daily. Reported on 06/12/2016    . ibuprofen (ADVIL,MOTRIN) 200 MG tablet Take 600 mg by mouth every 6 (six) hours as needed. Reported on 06/12/2016    . metFORMIN (GLUCOPHAGE) 500 MG tablet Take 1 tablet (500 mg total) by mouth 2 (two) times daily with a meal. 180 tablet 3  . methocarbamol (ROBAXIN) 750 MG tablet Take 1 tablet (750 mg total) by mouth every 8 (eight) hours as needed for muscle spasms. 30 tablet 0  . naproxen (NAPROSYN) 500 MG tablet Take 1 tablet (500 mg total) by mouth 2 (two) times daily with a meal. 60 tablet 0   No current facility-administered medications on file prior to visit.    Allergies  Allergen Reactions  . Flagyl [Metronidazole] Itching   Family History  Problem Relation Age of Onset  . Heart disease Father   . Kidney disease Father   . Diabetes Father   . Diabetes Maternal Grandmother   . Diabetes Paternal Grandmother   . Breast cancer Neg Hx     PE: BP 130/70   Pulse 90   Ht 5\' 5"  (1.651 m)   Wt 214 lb 6.4 oz (97.3 kg)   LMP 11/03/2012   SpO2 96%   BMI 35.68 kg/m  Wt  Readings from Last 3 Encounters:  07/28/18 214 lb 6.4 oz (97.3 kg)  07/28/17 215 lb (97.5 kg)  07/07/17 216 lb (98 kg)   Constitutional: overweight, in NAD Eyes: PERRLA, EOMI, no exophthalmos ENT: moist mucous membranes, no thyromegaly, no cervical lymphadenopathy Cardiovascular: No tachycardia at the time of the exam, RR, No MRG Respiratory: CTA B Gastrointestinal: abdomen soft, NT, ND, BS+ Musculoskeletal: no deformities, strength intact in all 4 Skin: moist,  warm, no rashes Neurological: no tremor with outstretched hands, DTR normal in all 4  ASSESSMENT: 1. 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 Relative increased uptake within a right lower pole nodule in thesetting of depressed TSH is concerning for an autonomous nodule. 2. Photopenia in the lower pole of the left lobe suggests a cold nodule. 3. Consider thyroid ultrasound prior to consideration of I 131 therapy for hyperthyroidism. 4. 24 hour I 131 uptake = 18% (normal 10-30%) - 06/30/2014: Thyroid U/S:  Right thyroid lobe Measurements: 6.3 x 1.9 x 2.2 cm. Well-circumscribed, solid mid right thyroid nodule measures approximately 1.6 x 0.8 x 2.1 cm. This is a noncalcified nodule. Tiny cystic areas are present in the inferior right lobe measuring 0.3 and 0.5 cm.   Left thyroid lobe Measurements: 5.8 x 2.1 x 2.1 cm. Ovoid nodule in the mid left lobe measures approximately 1.5 x 1.1 x 1.9 cm. This likely corresponds to the nuclear medicine abnormality. Mildly complex cyst in the superior left lobe measures 0.9 cm. Small cysts in the inferior left lobe measures 0.3 cm.   Isthmus Thickness: 0.4 cm. No nodules visualized.   Lymphadenopathy None visualized.  IMPRESSION:  Bilateral dominant thyroid nodules. Right thyroid nodule measures approximately 1.6 x 0.8 x 2.1 cm and is located in the midportion of  the right lobe. This nodule could be watched or sampled based on size criteria. Nodule in the mid left lobe  measures 1.5 x 1.1 x 1.9  cm and likely corresponds to the cold nuclear medicine defect. There would be an indication to biopsy this nodule based on size criteria and nuclear medicine findings.    2. Cold L thyroid nodule - FNA benign (09/14/2014):  Adequacy Reason Satisfactory For Evaluation. Diagnosis THYROID, FINE NEEDLE ASPIRATION LMP (SPECIMEN 1 OF 1, COLLECTED ON 06/13/14): BENIGN. FINDINGS CONSISTENT WITH A BENIGN FOLLICULAR NODULE. Enid Cutter MD Pathologist, Electronic Signature (Case signed 09/14/2014)  3. [place] Hyperthyroidism  4. DM2, non-insulin-dependent, without long-term complications   PLAN:  1. And 2. R toxic adenoma and cold L thyroid nodule  - Patient has a history of subclinical hyperthyroidism, with history of thyrotoxic symptoms: Heat intolerance, palpitations (improved on beta-blocker: Atenolol).  She was found to have a right toxic adenoma, however, her TFTs normalized spontaneously and we decided to just follow this conservatively.  She also has a cold left-sided thyroid nodule that was biopsied in 2015 with benign results.  At last visit, I ordered a thyroid ultrasound but she did not have this done... We again discussed about the importance of following the left-sided nodule and I will order this again.  I advised her to let me know if she is not called to schedule this within the next week. - Denies any neck compression symptoms - Also denies hyperthyroid symptoms at today's visit, but does have palpitations.  These have improved after moving the atenolol at night.  Sometimes her pulse at home is in the 90s and we discussed about increasing the dose of her atenolol from 25 mg daily to 25 mg twice a day if she has periods in which she feels more palpitations. - We will recheck her TFTs today.  She is taking biotin, but not a very large dose and she last took it yesterday. - I will see her back in another year, but  may need to have her return sooner for labs  3.  Subclinical hyperthyroidism -Resolved -Reviewed her latest TFTs and these were normal, however, these  measurements were checked a year ago. -Recheck TFTs today  4. DM2 - dx'ed 2018 - Most recent HbA1c available for review 7.1% a year ago, decreased from 7.9% - today, HbA1c higher, at 7.6% - she was on metformin 500 mg 2x a day >> stopped 2/2 dizziness - advised to restart Metformin 500 mg twice a day - Followed by PCP  Office Visit on 07/28/2018  Component Date Value Ref Range Status  . TSH 07/28/2018 0.70  0.35 - 4.50 uIU/mL Final  . T3, Free 07/28/2018 4.8* 2.3 - 4.2 pg/mL Final  . Free T4 07/28/2018 1.10  0.60 - 1.60 ng/dL Final   Comment: Specimens from patients who are undergoing biotin therapy and /or ingesting biotin supplements may contain high levels of biotin.  The higher biotin concentration in these specimens interferes with this Free T4 assay.  Specimens that contain high levels  of biotin may cause false high results for this Free T4 assay.  Please interpret results in light of the total clinical presentation of the patient.     Labs are normal.  Free T3 slightly high, but in the presence of a normal TSH and free T4, no intervention is needed.  Reading Physician Reading Date Result Priority  Marybelle Killings, MD 08/19/2018     Narrative    CLINICAL DATA: Prior ultrasound follow-up. Follow-up nodule.  EXAM: THYROID ULTRASOUND  TECHNIQUE: Ultrasound examination of the thyroid gland and adjacent soft tissues was performed.  COMPARISON: 06/30/2014  FINDINGS: Parenchymal Echotexture: Mildly heterogenous Isthmus: 0.3 cm, previously 0.4 cm Right lobe: 5.5 x 1.6 x 1.9 cm, previously 6.3 x 1.9 x 2.2 cm Left lobe: 5.5 x 1.5 x 1.7 cm, previously 5.8 x 2.1 x 2.1 cm ____________________________________________________  Nodule # 1: Prior biopsy: No Location: Right; Mid Maximum size: 1.9 cm; Other 2 dimensions: 1.2 x 0.8 cm, previously, 2.1 x 1.6 x 0.8 cm Composition:  solid/almost completely solid (2) Echogenicity: hypoechoic (2)  **Given size (>/= 1.5 cm) and appearance, fine needle aspiration of this moderately suspicious nodule should be considered based on TI-RADS criteria. _________________________________________________  Hypoechoic left mid nodule 3 measures 0.9 cm and previously measured up to 1.9 cm. Biopsy was performed previously.  Left upper pole nodule 2 is predominately cystic and does not meet criteria for biopsy nor follow-up. It measures 1.3 cm and previously measured up to 0.9 cm.  IMPRESSION: Right nodule 1 is stable and continues to meet criteria for fine needle aspiration biopsy.  Left nodule 2 is cystic and does not meet criteria for biopsy nor follow-up.  Left nodule 3 is smaller and previously underwent biopsy.  The above is in keeping with the ACR TI-RADS recommendations - J Am Coll Radiol 2017;14:587-595.   Electronically Signed By: Marybelle Killings M.D. On: 08/19/2018 14:43   Patient's right thyroid nodule is stable in size, but continues to meet criteria for biopsy.  However, this nodule has been found to be hot on the thyroid scan, so for now, since the risk of cancer is very low, I would prefer to follow the nodule repeating another ultrasound in a year.  Philemon Kingdom, MD PhD [facility] Endocrinology

## 2018-07-28 NOTE — Patient Instructions (Addendum)
You will need a new thyroid U/S - I ordered this.  Please stop at the lab.  Please return in 1 year.

## 2018-07-30 LAB — POCT GLYCOSYLATED HEMOGLOBIN (HGB A1C): Hemoglobin A1C: 7.6 % — AB (ref 4.0–5.6)

## 2018-07-30 NOTE — Addendum Note (Signed)
Addended by: Drucilla Schmidt on: 07/30/2018 02:03 PM   Modules accepted: Orders

## 2018-08-19 ENCOUNTER — Ambulatory Visit
Admission: RE | Admit: 2018-08-19 | Discharge: 2018-08-19 | Disposition: A | Discharge status: Home / Self Care | Payer: Commercial Managed Care - PPO | Source: Ambulatory Visit | Attending: Internal Medicine | Admitting: Internal Medicine

## 2018-08-19 ENCOUNTER — Ambulatory Visit: Payer: Commercial Managed Care - PPO | Admitting: Primary Care

## 2018-08-19 ENCOUNTER — Encounter: Payer: Self-pay | Admitting: Primary Care

## 2018-08-19 VITALS — BP 134/62 | HR 84 | Temp 97.5°F | Ht 65.0 in | Wt 208.5 lb

## 2018-08-19 DIAGNOSIS — Z1231 Encounter for screening mammogram for malignant neoplasm of breast: Secondary | ICD-10-CM | POA: Diagnosis not present

## 2018-08-19 DIAGNOSIS — Z0000 Encounter for general adult medical examination without abnormal findings: Secondary | ICD-10-CM | POA: Diagnosis not present

## 2018-08-19 DIAGNOSIS — G4700 Insomnia, unspecified: Secondary | ICD-10-CM

## 2018-08-19 DIAGNOSIS — E669 Obesity, unspecified: Secondary | ICD-10-CM

## 2018-08-19 DIAGNOSIS — Z1239 Encounter for other screening for malignant neoplasm of breast: Secondary | ICD-10-CM

## 2018-08-19 DIAGNOSIS — K219 Gastro-esophageal reflux disease without esophagitis: Secondary | ICD-10-CM

## 2018-08-19 DIAGNOSIS — E0590 Thyrotoxicosis, unspecified without thyrotoxic crisis or storm: Secondary | ICD-10-CM

## 2018-08-19 DIAGNOSIS — E119 Type 2 diabetes mellitus without complications: Secondary | ICD-10-CM

## 2018-08-19 LAB — COMPREHENSIVE METABOLIC PANEL
ALBUMIN: 4.5 g/dL (ref 3.5–5.2)
ALK PHOS: 75 U/L (ref 39–117)
ALT: 28 U/L (ref 0–35)
AST: 26 U/L (ref 0–37)
BUN: 14 mg/dL (ref 6–23)
CALCIUM: 10.1 mg/dL (ref 8.4–10.5)
CHLORIDE: 101 meq/L (ref 96–112)
CO2: 27 mEq/L (ref 19–32)
Creatinine, Ser: 0.81 mg/dL (ref 0.40–1.20)
GFR: 77.72 mL/min (ref >60.00)
Glucose, Bld: 236 mg/dL — ABNORMAL HIGH (ref 70–99)
POTASSIUM: 3.9 meq/L (ref 3.5–5.1)
Sodium: 137 mEq/L (ref 135–145)
Total Bilirubin: 0.6 mg/dL (ref 0.2–1.2)
Total Protein: 8.1 g/dL (ref 6.0–8.3)

## 2018-08-19 LAB — MICROALBUMIN / CREATININE URINE RATIO
Creatinine,U: 92.5 mg/dL
Microalb Creat Ratio: 0.8 mg/g (ref 0.0–30.0)

## 2018-08-19 LAB — LIPID PANEL
CHOLESTEROL: 161 mg/dL (ref 0–200)
HDL: 43 mg/dL (ref >39.00)
LDL CALC: 99 mg/dL (ref 0–99)
NONHDL: 117.53
Total CHOL/HDL Ratio: 4
Triglycerides: 95 mg/dL (ref 0.0–149.0)
VLDL: 19 mg/dL (ref 0.0–40.0)

## 2018-08-19 NOTE — Assessment & Plan Note (Signed)
Recent A1C of 7.6, taking Metformin once daily for now. Recommended she try to increase to BID dosing if possible, she will update.   Eye exam UTD. Pneumonia vaccination UTD. Lipid panel and urine microalbumin pending.  Follow up in 6 months.

## 2018-08-19 NOTE — Progress Notes (Signed)
Subjective:    Patient ID: Rebecca Macias, female    DOB: 1961-01-06, 57 y.o.   MRN: [id number]  HPI  Rebecca Macias is a 57 year old female who presents today to transfer care and for complete physical.  1) Type 2 Diabetes: Currently prescribed Metformin 500 mg BID which she started nearly one year ago, but is only taking once daily for which she's been doing for one month. She denies dizziness, numbness/tingling. Her last A1C was in late August 2019 with a result of 7.6.  Diet currently consists of:  Breakfast: Rebecca Macias, eggs Lunch: Chicken, fish, vegetables Dinner: Meat, vegetable  Snacks: Occasionally atkins bar Desserts: None Beverages: Water, ICE flavored water  Exercise: She goes to complete fitness 1-3 times weekly. Walks a lot at work.   Wt Readings from Last 3 Encounters:  08/19/18 208 lb 8 oz (94.6 kg)  07/28/18 214 lb 6.4 oz (97.3 kg)  07/28/17 215 lb (97.5 kg)     2) Subclinical Hyperthyroidism: Currently following with endocrinology. She is managed on atenolol 25 mg daily.    Immunizations: -Tetanus: Completed in 2016 -Influenza: Will get at work -Pneumonia: Completed in 2018   Eye exam: Completed in 2019 Dental exam: Completes semi-annually Colonoscopy: Completed in 2013, due in 2023 Pap Smear: Completed in 2018 Mammogram: Due this year Hep C Screen: Completed in 2018   Review of Systems  Constitutional: Negative for unexpected weight change.  HENT: Negative for rhinorrhea.   Respiratory: Negative for cough and shortness of breath.   Cardiovascular: Negative for chest pain.  Gastrointestinal: Negative for constipation and diarrhea.  Genitourinary: Negative for difficulty urinating.  Musculoskeletal: Negative for arthralgias and myalgias.  Skin: Negative for rash.  Allergic/Immunologic: Negative for environmental allergies.  Neurological: Negative for dizziness, numbness and headaches.  Psychiatric/Behavioral: The patient is not nervous/anxious.         Past Medical History:  Diagnosis Date  . H/O gestational diabetes mellitus, not currently pregnant   . Ovarian cyst   . Sigmoid diverticulitis 08/2012     Social History   Socioeconomic History  . Marital status: Divorced    Spouse name: Not on file  . Number of children: Not on file  . Years of education: Not on file  . Highest education level: Not on file  Occupational History  . Not on file  Social Needs  . Financial resource strain: Not on file  . Food insecurity:    Worry: Not on file    Inability: Not on file  . Transportation needs:    Medical: Not on file    Non-medical: Not on file  Tobacco Use  . Smoking status: Former Smoker    Types: Cigarettes, Cigars    Last attempt to quit: 09/19/2011    Years since quitting: 6.9  . Smokeless tobacco: Never Used  Substance and Sexual Activity  . Alcohol use: No  . Drug use: No  . Sexual activity: Not on file  Lifestyle  . Physical activity:    Days per week: Not on file    Minutes per session: Not on file  . Stress: Not on file  Relationships  . Social connections:    Talks on phone: Not on file    Gets together: Not on file    Attends religious service: Not on file    Active member of club or organization: Not on file    Attends meetings of clubs or organizations: Not on file    Relationship status: Not  on file  . Intimate partner violence:    Fear of current or ex partner: Not on file    Emotionally abused: Not on file    Physically abused: Not on file    Forced sexual activity: Not on file  Other Topics Concern  . 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    Past Surgical History:  Procedure Laterality Date  . BLADDER SUSPENSION  2009  . TUBAL LIGATION      Family History  Problem Relation Age of Onset  . Heart disease Father   . Kidney disease Father   . Diabetes Father   . Diabetes Maternal  Grandmother   . Diabetes Paternal Grandmother   . Breast cancer Neg Hx     Allergies  Allergen Reactions  . Flagyl [Metronidazole] Itching    Current Outpatient Medications on File Prior to Visit  Medication Sig Dispense Refill  . atenolol (TENORMIN) 25 MG tablet Take 1 tablet (25 mg total) by mouth daily. 90 tablet 3  . Biotin 1000 MCG tablet Take 1,000 mcg by mouth daily.    . cyanocobalamin 500 MCG tablet Take 1,000 mcg by mouth daily. Reported on 06/12/2016    . docusate sodium (STOOL SOFTENER) 100 MG capsule Take 200 mg by mouth daily. Reported on 06/12/2016    . ibuprofen (ADVIL,MOTRIN) 200 MG tablet Take 600 mg by mouth every 6 (six) hours as needed. Reported on 06/12/2016    . metFORMIN (GLUCOPHAGE) 500 MG tablet Take 1 tablet (500 mg total) by mouth 2 (two) times daily with a meal. (Patient taking differently: Take 500 mg by mouth 2 (two) times daily with a meal. Once daily instead of twice daily) 180 tablet 3  . naproxen (NAPROSYN) 500 MG tablet Take 1 tablet (500 mg total) by mouth 2 (two) times daily with a meal. 60 tablet 0   No current facility-administered medications on file prior to visit.     BP 134/62   Pulse 84   Temp (!) 97.5 F (36.4 C)   Ht 5\' 5"  (1.651 m)   Wt 208 lb 8 oz (94.6 kg)   LMP 11/03/2012   SpO2 96%   BMI 34.70 kg/m    Objective:   Physical Exam  Constitutional: She is oriented to person, place, and time. She appears well-nourished.  HENT:  Mouth/Throat: No oropharyngeal exudate.  Eyes: Pupils are equal, round, and reactive to light. EOM are normal.  Neck: Neck supple. No thyromegaly present.  Cardiovascular: Normal rate and regular rhythm.  Respiratory: Effort normal and breath sounds normal.  GI: Soft. Bowel sounds are normal. There is no tenderness.  Musculoskeletal: Normal range of motion.  Neurological: She is alert and oriented to person, place, and time.  Skin: Skin is warm and dry.  Psychiatric: She has a normal mood and affect.            Assessment & Plan:

## 2018-08-19 NOTE — Assessment & Plan Note (Signed)
Denies recent symptoms of insomnia.

## 2018-08-19 NOTE — Assessment & Plan Note (Signed)
Denies recent GERD symptoms.  Has lost weight through improvement in diet, commended her on this.

## 2018-08-19 NOTE — Patient Instructions (Addendum)
Stop by the lab prior to leaving today. I will notify you of your results once received.   Call the [facility] to schedule your mammogram.  Start exercising. You should be getting 150 minutes of moderate intensity exercise weekly.  Continue to work on [employer], congratulations on your weight loss!  Try in increase your metformin to twice daily if possible.  Schedule a follow up visit in 6 months for diabetes check.  It was a pleasure meeting you!   Preventive Care 40-64 Years, Female Preventive care refers to lifestyle choices and visits with your health care provider that can promote health and wellness. What does preventive care include?  A yearly physical exam. This is also called an annual well check.  Dental exams once or twice a year.  Routine eye exams. Ask your health care provider how often you should have your eyes checked.  Personal lifestyle choices, including: ? Daily care of your teeth and gums. ? Regular physical activity. ? Eating a healthy diet. ? Avoiding tobacco and drug use. ? Limiting alcohol use. ? Practicing safe sex. ? Taking low-dose aspirin daily starting at age 50. ? Taking vitamin and mineral supplements as recommended by your health care provider. What happens during an annual well check? The services and screenings done by your health care provider during your annual well check will depend on your age, overall health, lifestyle risk factors, and family history of disease. Counseling Your health care provider may ask you questions about your:  Alcohol use.  Tobacco use.  Drug use.  Emotional well-being.  Home and relationship well-being.  Sexual activity.  Eating habits.  Work and work Statistician.  Method of birth control.  Menstrual cycle.  Pregnancy history.  Screening You may have the following tests or measurements:  Height, weight, and BMI.  Blood pressure.  Lipid and cholesterol levels. These may be  checked every 5 years, or more frequently if you are over 40 years old.  Skin check.  Lung cancer screening. You may have this screening every year starting at age 1 if you have a 30-pack-year history of smoking and currently smoke or have quit within the past 15 years.  Fecal occult blood test (FOBT) of the stool. You may have this test every year starting at age 65.  Flexible sigmoidoscopy or colonoscopy. You may have a sigmoidoscopy every 5 years or a colonoscopy every 10 years starting at age 55.  Hepatitis C blood test.  Hepatitis B blood test.  Sexually transmitted disease (STD) testing.  Diabetes screening. This is done by checking your blood sugar (glucose) after you have not eaten for a while (fasting). You may have this done every 1-3 years.  Mammogram. This may be done every 1-2 years. Talk to your health care provider about when you should start having regular mammograms. This may depend on whether you have a family history of breast cancer.  BRCA-related cancer screening. This may be done if you have a family history of breast, ovarian, tubal, or peritoneal cancers.  Pelvic exam and Pap test. This may be done every 3 years starting at age 41. Starting at age 82, this may be done every 5 years if you have a Pap test in combination with an HPV test.  Bone density scan. This is done to screen for osteoporosis. You may have this scan if you are at high risk for osteoporosis.  Discuss your test results, treatment options, and if necessary, the need for more tests  with your health care provider. Vaccines Your health care provider may recommend certain vaccines, such as:  Influenza vaccine. This is recommended every year.  Tetanus, diphtheria, and acellular pertussis (Tdap, Td) vaccine. You may need a Td booster every 10 years.  Varicella vaccine. You may need this if you have not been vaccinated.  Zoster vaccine. You may need this after age 35.  Measles, mumps, and  rubella (MMR) vaccine. You may need at least one dose of MMR if you were born in 1957 or later. You may also need a second dose.  Pneumococcal 13-valent conjugate (PCV13) vaccine. You may need this if you have certain conditions and were not previously vaccinated.  Pneumococcal polysaccharide (PPSV23) vaccine. You may need one or two doses if you smoke cigarettes or if you have certain conditions.  Meningococcal vaccine. You may need this if you have certain conditions.  Hepatitis A vaccine. You may need this if you have certain conditions or if you travel or work in places where you may be exposed to hepatitis A.  Hepatitis B vaccine. You may need this if you have certain conditions or if you travel or work in places where you may be exposed to hepatitis B.  Haemophilus influenzae type b (Hib) vaccine. You may need this if you have certain conditions.  Talk to your health care provider about which screenings and vaccines you need and how often you need them. This information is not intended to replace advice given to you by your health care provider. Make sure you discuss any questions you have with your health care provider. Document Released: 12/21/2015 Document Revised: 08/13/2016 Document Reviewed: 09/25/2015 Elsevier Interactive Patient Education  [employer].

## 2018-08-19 NOTE — Assessment & Plan Note (Signed)
Immunizations UTD. Will get influenza vaccination at work. Pap smear UTD.  Mammogram due, ordered. Colonoscopy UTD. Recommended to continue to work on diet, start exercising. Exam unremarkable. Labs pending. Follow up in 1 year for CPE.

## 2018-08-19 NOTE — Assessment & Plan Note (Signed)
Following with endocrinology. Recent thyroid labs reviewed.

## 2018-08-19 NOTE — Assessment & Plan Note (Signed)
Commended her on recent weight loss. Encouraged to continue to work on diet and start exercising.

## 2018-09-27 ENCOUNTER — Encounter: Payer: Commercial Managed Care - PPO | Admitting: Primary Care

## 2018-11-03 ENCOUNTER — Ambulatory Visit
Admission: RE | Admit: 2018-11-03 | Discharge: 2018-11-03 | Disposition: A | Discharge status: Home / Self Care | Payer: Commercial Managed Care - PPO | Source: Ambulatory Visit | Attending: Primary Care | Admitting: Primary Care

## 2018-11-03 DIAGNOSIS — Z1239 Encounter for other screening for malignant neoplasm of breast: Secondary | ICD-10-CM | POA: Diagnosis present

## 2019-05-27 ENCOUNTER — Other Ambulatory Visit: Payer: Self-pay

## 2019-05-27 ENCOUNTER — Emergency Department
Admission: EM | Admit: 2019-05-27 | Discharge: 2019-05-27 | Discharge status: Against Medical Advice | Payer: Commercial Managed Care - PPO

## 2019-05-27 NOTE — ED Notes (Signed)
Patient called times three for triage with no answer.

## 2019-07-11 ENCOUNTER — Other Ambulatory Visit: Payer: Self-pay | Admitting: Internal Medicine

## 2019-07-18 ENCOUNTER — Telehealth: Payer: Self-pay | Admitting: Internal Medicine

## 2019-07-18 NOTE — Telephone Encounter (Signed)
Unable to get in contact with the patient, mailed an appointment reminder.  RE: Appointment Request (HM)  From  Risa Grill To  Rebecca Clan Rajkumar Sent and Delivered  06/30/2019 3:42 PM  Thank you for contacting us via [place]. We have scheduled you on Dr.Gherghe's next available, October 16th @ 10:30 a.m. We will add you to our cancellation list for any sooner appointment. Please call us at [phone] to reschedule or cancel your appointment.   Thank you,  [facility]     Previous Messages  ----- Message -----    From:Mayanna J Spoto    Sent:06/30/2019 3:19 PM EDT     EZ:[id number] Cruzita Lederer, MD  Subject:Appointment Request (HM)   Appointment Request From: Rebecca Macias   With Provider: Philemon Kingdom, MD [facility]]   Preferred Date Range: Any date 07/27/2019 or later   Preferred Times: Any   Reason: To address the following health maintenance concerns.  Hemoglobin A1c   Comments:  For annual check    [address]  MyChart User Last Read On  Rebecca Macias Not Read

## 2019-07-25 ENCOUNTER — Ambulatory Visit: Payer: Commercial Managed Care - PPO | Admitting: Primary Care

## 2019-07-26 ENCOUNTER — Ambulatory Visit: Payer: Commercial Managed Care - PPO | Admitting: Primary Care

## 2019-09-06 ENCOUNTER — Ambulatory Visit (INDEPENDENT_AMBULATORY_CARE_PROVIDER_SITE_OTHER): Payer: Commercial Managed Care - PPO | Admitting: Primary Care

## 2019-09-06 ENCOUNTER — Other Ambulatory Visit: Payer: Self-pay

## 2019-09-06 ENCOUNTER — Encounter: Payer: Self-pay | Admitting: Primary Care

## 2019-09-06 VITALS — BP 124/74 | HR 77 | Temp 97.8°F | Ht 65.0 in | Wt 202.8 lb

## 2019-09-06 DIAGNOSIS — E119 Type 2 diabetes mellitus without complications: Secondary | ICD-10-CM | POA: Diagnosis not present

## 2019-09-06 DIAGNOSIS — Z1159 Encounter for screening for other viral diseases: Secondary | ICD-10-CM

## 2019-09-06 DIAGNOSIS — Z1239 Encounter for other screening for malignant neoplasm of breast: Secondary | ICD-10-CM | POA: Diagnosis not present

## 2019-09-06 DIAGNOSIS — G4700 Insomnia, unspecified: Secondary | ICD-10-CM

## 2019-09-06 DIAGNOSIS — E0590 Thyrotoxicosis, unspecified without thyrotoxic crisis or storm: Secondary | ICD-10-CM | POA: Diagnosis not present

## 2019-09-06 DIAGNOSIS — K219 Gastro-esophageal reflux disease without esophagitis: Secondary | ICD-10-CM | POA: Diagnosis not present

## 2019-09-06 DIAGNOSIS — Z0000 Encounter for general adult medical examination without abnormal findings: Secondary | ICD-10-CM | POA: Diagnosis not present

## 2019-09-06 DIAGNOSIS — R002 Palpitations: Secondary | ICD-10-CM

## 2019-09-06 LAB — COMPREHENSIVE METABOLIC PANEL
ALT: 14 U/L (ref 0–35)
AST: 17 U/L (ref 0–37)
Albumin: 4.2 g/dL (ref 3.5–5.2)
Alkaline Phosphatase: 71 U/L (ref 39–117)
BUN: 13 mg/dL (ref 6–23)
CO2: 29 mEq/L (ref 19–32)
Calcium: 10 mg/dL (ref 8.4–10.5)
Chloride: 106 mEq/L (ref 96–112)
Creatinine, Ser: 0.74 mg/dL (ref 0.40–1.20)
GFR: 80.86 mL/min (ref >60.00)
Glucose, Bld: 128 mg/dL — ABNORMAL HIGH (ref 70–99)
Potassium: 4.4 mEq/L (ref 3.5–5.1)
Sodium: 142 mEq/L (ref 135–145)
Total Bilirubin: 0.6 mg/dL (ref 0.2–1.2)
Total Protein: 7.6 g/dL (ref 6.0–8.3)

## 2019-09-06 LAB — MICROALBUMIN / CREATININE URINE RATIO
Creatinine,U: 87.7 mg/dL
Microalb Creat Ratio: 0.8 mg/g (ref 0.0–30.0)
Microalb, Ur: <0.7 mg/dL (ref 0.0–1.9)

## 2019-09-06 LAB — LIPID PANEL
Cholesterol: 225 mg/dL — ABNORMAL HIGH (ref 0–200)
HDL: 51.6 mg/dL (ref >39.00)
LDL Cholesterol: 146 mg/dL — ABNORMAL HIGH (ref 0–99)
NonHDL: 173.19
Total CHOL/HDL Ratio: 4
Triglycerides: 135 mg/dL (ref 0.0–149.0)
VLDL: 27 mg/dL (ref 0.0–40.0)

## 2019-09-06 LAB — HEMOGLOBIN A1C: Hgb A1c MFr Bld: 6.4 % (ref 4.6–6.5)

## 2019-09-06 LAB — T4, FREE: Free T4: 1.03 ng/dL (ref 0.60–1.60)

## 2019-09-06 LAB — TSH: TSH: 0.69 u[IU]/mL (ref 0.35–4.50)

## 2019-09-06 NOTE — Assessment & Plan Note (Addendum)
Following with endocrinology, scheduled for Fall 2020. Repeat TSH and Free T4 pending.

## 2019-09-06 NOTE — Assessment & Plan Note (Signed)
Repeat A1C pending, compliant to prescribed metformin. Strongly encouraged regular exercise, healthy diet.  Pneumonia vaccination UTD. Eye exam UTD per patient. Foot exam today.  Urine microalbumin pending. Lipid panel pending, not on statin therapy at this time.  Follow up with either endocrinology as scheduled or myself in 6 months.

## 2019-09-06 NOTE — Assessment & Plan Note (Signed)
Denies concerns for GERD. Continue to monitor.  

## 2019-09-06 NOTE — Assessment & Plan Note (Signed)
Denies sleep disturbance.

## 2019-09-06 NOTE — Patient Instructions (Addendum)
Stop by the lab prior to leaving today. I will notify you of your results once received.   Start exercising. You should be getting 150 minutes of moderate intensity exercise weekly.  It's important to improve your diet by reducing consumption of fast food, fried food, processed snack foods, sugary drinks. Increase consumption of fresh vegetables and fruits, whole grains, water.  Ensure you are drinking 64 ounces of water daily.  Follow up with your endocrinologist as scheduled.  It was a pleasure to see you today!   Preventive Care 57-39 Years Old, Female Preventive care refers to visits with your health care provider and lifestyle choices that can promote health and wellness. This includes:  A yearly physical exam. This may also be called an annual well check.  Regular dental visits and eye exams.  Immunizations.  Screening for certain conditions.  Healthy lifestyle choices, such as eating a healthy diet, getting regular exercise, not using drugs or products that contain nicotine and tobacco, and limiting alcohol use. What can I expect for my preventive care visit? Physical exam Your health care provider will check your:  Height and weight. This may be used to calculate body mass index (BMI), which tells if you are at a healthy weight.  Heart rate and blood pressure.  Skin for abnormal spots. Counseling Your health care provider may ask you questions about your:  Alcohol, tobacco, and drug use.  Emotional well-being.  Home and relationship well-being.  Sexual activity.  Eating habits.  Work and work Statistician.  Method of birth control.  Menstrual cycle.  Pregnancy history. What immunizations do I need?  Influenza (flu) vaccine  This is recommended every year. Tetanus, diphtheria, and pertussis (Tdap) vaccine  You may need a Td booster every 10 years. Varicella (chickenpox) vaccine  You may need this if you have not been vaccinated. Zoster (shingles)  vaccine  You may need this after age 55. Measles, mumps, and rubella (MMR) vaccine  You may need at least one dose of MMR if you were born in 1957 or later. You may also need a second dose. Pneumococcal conjugate (PCV13) vaccine  You may need this if you have certain conditions and were not previously vaccinated. Pneumococcal polysaccharide (PPSV23) vaccine  You may need one or two doses if you smoke cigarettes or if you have certain conditions. Meningococcal conjugate (MenACWY) vaccine  You may need this if you have certain conditions. Hepatitis A vaccine  You may need this if you have certain conditions or if you travel or work in places where you may be exposed to hepatitis A. Hepatitis B vaccine  You may need this if you have certain conditions or if you travel or work in places where you may be exposed to hepatitis B. Haemophilus influenzae type b (Hib) vaccine  You may need this if you have certain conditions. Human papillomavirus (HPV) vaccine  If recommended by your health care provider, you may need three doses over 6 months. You may receive vaccines as individual doses or as more than one vaccine together in one shot (combination vaccines). Talk with your health care provider about the risks and benefits of combination vaccines. What tests do I need? Blood tests  Lipid and cholesterol levels. These may be checked every 5 years, or more frequently if you are over 56 years old.  Hepatitis C test.  Hepatitis B test. Screening  Lung cancer screening. You may have this screening every year starting at age 1 if you have a 30-pack-year  history of smoking and currently smoke or have quit within the past 15 years.  Colorectal cancer screening. All adults should have this screening starting at age 19 and continuing until age 9. Your health care provider may recommend screening at age 57 if you are at increased risk. You will have tests every 1-10 years, depending on your  results and the type of screening test.  Diabetes screening. This is done by checking your blood sugar (glucose) after you have not eaten for a while (fasting). You may have this done every 1-3 years.  Mammogram. This may be done every 1-2 years. Talk with your health care provider about when you should start having regular mammograms. This may depend on whether you have a family history of breast cancer.  BRCA-related cancer screening. This may be done if you have a family history of breast, ovarian, tubal, or peritoneal cancers.  Pelvic exam and Pap test. This may be done every 3 years starting at age 70. Starting at age 66, this may be done every 5 years if you have a Pap test in combination with an HPV test. Other tests  Sexually transmitted disease (STD) testing.  Bone density scan. This is done to screen for osteoporosis. You may have this scan if you are at high risk for osteoporosis. Follow these instructions at home: Eating and drinking  Eat a diet that includes fresh fruits and vegetables, whole grains, lean protein, and low-fat dairy.  Take vitamin and mineral supplements as recommended by your health care provider.  Do not drink alcohol if: ? Your health care provider tells you not to drink. ? You are pregnant, may be pregnant, or are planning to become pregnant.  If you drink alcohol: ? Limit how much you have to 0-1 drink a day. ? Be aware of how much alcohol is in your drink. In the [place] one drink equals one 12 oz bottle of beer (355 mL), one 5 oz glass of wine (148 mL), or one 1 oz glass of hard liquor (44 mL). Lifestyle  Take daily care of your teeth and gums.  Stay active. Exercise for at least 30 minutes on 5 or more days each week.  Do not use any products that contain nicotine or tobacco, such as cigarettes, e-cigarettes, and chewing tobacco. If you need help quitting, ask your health care provider.  If you are sexually active, practice safe sex. Use a  condom or other form of birth control (contraception) in order to prevent pregnancy and STIs (sexually transmitted infections).  If told by your health care provider, take low-dose aspirin daily starting at age 70. What's next?  Visit your health care provider once a year for a well check visit.  Ask your health care provider how often you should have your eyes and teeth checked.  Stay up to date on all vaccines. This information is not intended to replace advice given to you by your health care provider. Make sure you discuss any questions you have with your health care provider. Document Released: 12/21/2015 Document Revised: 08/05/2018 Document Reviewed: 08/05/2018 Elsevier Patient Education  2020 [employer].

## 2019-09-06 NOTE — Assessment & Plan Note (Signed)
Resolved. Continue atenolol.

## 2019-09-06 NOTE — Progress Notes (Signed)
Subjective:    Patient ID: Rebecca Macias, female    DOB: 03/01/1961, 58 y.o.   MRN: [id number]  HPI  Rebecca Macias is a 58 year old female who presents today for complete physical.  Immunizations: -Tetanus: Completed in 2016 -Influenza: Will get at work  Diet: She endorses a fair diet. She is eating home cooked meals and take out food. She is eating mostly protein, cheese, eggs, little vegetables. Desserts daily. Drinking mostly ICE water, Propel, water, occasional soda. Exercise: She is not exercising.  Eye exam: Completed in March 2020 Dental exam: Completes annually  Colonoscopy: Completed in 2013, due in 2023 Pap Smear: Negative in 2018, due in 2021 Mammogram: Completed in November 2019 Hep C Screen: Negative  BP Readings from Last 3 Encounters:  09/06/19 124/74  08/19/18 134/62  07/28/18 130/70   Wt Readings from Last 3 Encounters:  09/06/19 202 lb 12 oz (92 kg)  08/19/18 208 lb 8 oz (94.6 kg)  07/28/18 214 lb 6.4 oz (97.3 kg)     Review of Systems  Constitutional: Negative for unexpected weight change.  HENT: Negative for rhinorrhea.   Respiratory: Negative for cough and shortness of breath.   Cardiovascular: Negative for chest pain.  Gastrointestinal: Negative for constipation and diarrhea.  Genitourinary: Negative for difficulty urinating.  Musculoskeletal: Negative for arthralgias and myalgias.  Skin: Negative for rash.  Allergic/Immunologic: Negative for environmental allergies.  Neurological: Negative for dizziness, numbness and headaches.  Psychiatric/Behavioral: The patient is not nervous/anxious.        Past Medical History:  Diagnosis Date  . H/O gestational diabetes mellitus, not currently pregnant   . Ovarian cyst   . Sigmoid diverticulitis 08/2012     Social History   Socioeconomic History  . Marital status: Divorced    Spouse name: Not on file  . Number of children: Not on file  . Years of education: Not on file  . Highest education  level: Not on file  Occupational History  . Not on file  Social Needs  . Financial resource strain: Not on file  . Food insecurity    Worry: Not on file    Inability: Not on file  . Transportation needs    Medical: Not on file    Non-medical: Not on file  Tobacco Use  . Smoking status: Former Smoker    Types: Cigarettes, Cigars    Quit date: 09/19/2011    Years since quitting: 7.9  . Smokeless tobacco: Never Used  Substance and Sexual Activity  . Alcohol use: No  . Drug use: No  . Sexual activity: Not on file  Lifestyle  . Physical activity    Days per week: Not on file    Minutes per session: Not on file  . Stress: Not on file  Relationships  . Social Herbalist on phone: Not on file    Gets together: Not on file    Attends religious service: Not on file    Active member of club or organization: Not on file    Attends meetings of clubs or organizations: Not on file    Relationship status: Not on file  . Intimate partner violence    Fear of current or ex partner: Not on file    Emotionally abused: Not on file    Physically abused: Not on file    Forced sexual activity: Not on file  Other Topics Concern  . Not on file  Social History Narrative  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    Past Surgical History:  Procedure Laterality Date  . BLADDER SUSPENSION  2009  . TUBAL LIGATION      Family History  Problem Relation Age of Onset  . Heart disease Father   . Kidney disease Father   . Diabetes Father   . Diabetes Maternal Grandmother   . Diabetes Paternal Grandmother   . Breast cancer Neg Hx     Allergies  Allergen Reactions  . Flagyl [Metronidazole] Itching    Current Outpatient Medications on File Prior to Visit  Medication Sig Dispense Refill  . atenolol (TENORMIN) 25 MG tablet Take 1 tablet (25 mg total) by mouth daily. 90 tablet 3  . Biotin 1000 MCG tablet Take 1,000 mcg by mouth daily.     . cyanocobalamin 500 MCG tablet Take 1,000 mcg by mouth daily. Reported on 06/12/2016    . docusate sodium (STOOL SOFTENER) 100 MG capsule Take 200 mg by mouth daily. Reported on 06/12/2016    . ibuprofen (ADVIL,MOTRIN) 200 MG tablet Take 600 mg by mouth every 6 (six) hours as needed. Reported on 06/12/2016    . metFORMIN (GLUCOPHAGE) 500 MG tablet TAKE 1 TABLET (500 MG TOTAL) BY MOUTH 2 (TWO) TIMES DAILY WITH A MEAL. 180 tablet 3   No current facility-administered medications on file prior to visit.     BP 124/74   Pulse 77   Temp 97.8 F (36.6 C) (Temporal)   Ht 5\' 5"  (1.651 m)   Wt 202 lb 12 oz (92 kg)   LMP 11/03/2012   SpO2 97%   BMI 33.74 kg/m    Objective:   Physical Exam  Constitutional: She is oriented to person, place, and time. She appears well-nourished.  HENT:  Right Ear: Tympanic membrane and ear canal normal.  Left Ear: Tympanic membrane and ear canal normal.  Mouth/Throat: Oropharynx is clear and moist.  Eyes: Pupils are equal, round, and reactive to light. EOM are normal.  Neck: Neck supple.  Cardiovascular: Normal rate and regular rhythm.  Respiratory: Effort normal and breath sounds normal.  GI: Soft. Bowel sounds are normal. There is no abdominal tenderness.  Musculoskeletal: Normal range of motion.  Neurological: She is alert and oriented to person, place, and time.  Skin: Skin is warm and dry.  Psychiatric: She has a normal mood and affect.           Assessment & Plan:

## 2019-09-06 NOTE — Assessment & Plan Note (Signed)
Immunizations UTD, will get influenza vaccination at work. Mammogram due this Fall, ordered. Pap smear UTD, due in 2021. Colonoscopy UTD, due in 2023. Discussed the importance of a healthy diet and regular exercise in order for weight loss, and to reduce the risk of any potential medical problems. Exam today unremarkable. Labs pending.

## 2019-09-07 DIAGNOSIS — E785 Hyperlipidemia, unspecified: Secondary | ICD-10-CM

## 2019-09-07 LAB — HEPATITIS C ANTIBODY
Hepatitis C Ab: NONREACTIVE
SIGNAL TO CUT-OFF: 0.01 (ref <1.00)

## 2019-09-07 MED ORDER — ATORVASTATIN CALCIUM 40 MG PO TABS: 40.0000 mg | ORAL_TABLET | Freq: Every evening | ORAL | 1 refills | Status: DC

## 2019-09-23 ENCOUNTER — Ambulatory Visit: Payer: Commercial Managed Care - PPO | Admitting: Internal Medicine

## 2019-10-17 ENCOUNTER — Other Ambulatory Visit (INDEPENDENT_AMBULATORY_CARE_PROVIDER_SITE_OTHER): Payer: Commercial Managed Care - PPO

## 2019-10-17 ENCOUNTER — Other Ambulatory Visit: Payer: Self-pay | Admitting: Internal Medicine

## 2019-10-17 DIAGNOSIS — E785 Hyperlipidemia, unspecified: Secondary | ICD-10-CM

## 2019-10-17 LAB — HEPATIC FUNCTION PANEL
ALT: 15 U/L (ref 0–35)
AST: 22 U/L (ref 0–37)
Albumin: 4.1 g/dL (ref 3.5–5.2)
Alkaline Phosphatase: 72 U/L (ref 39–117)
Bilirubin, Direct: 0.1 mg/dL (ref 0.0–0.3)
Total Bilirubin: 0.5 mg/dL (ref 0.2–1.2)
Total Protein: 6.9 g/dL (ref 6.0–8.3)

## 2019-10-17 LAB — LIPID PANEL
Cholesterol: 222 mg/dL — ABNORMAL HIGH (ref 0–200)
HDL: 46.6 mg/dL (ref >39.00)
NonHDL: 175.06
Total CHOL/HDL Ratio: 5
Triglycerides: 266 mg/dL — ABNORMAL HIGH (ref 0.0–149.0)
VLDL: 53.2 mg/dL — ABNORMAL HIGH (ref 0.0–40.0)

## 2019-10-17 LAB — LDL CHOLESTEROL, DIRECT: Direct LDL: 138 mg/dL

## 2019-10-18 MED ORDER — ROSUVASTATIN CALCIUM 5 MG PO TABS: 5.0000 mg | ORAL_TABLET | Freq: Every evening | ORAL | 0 refills | Status: DC

## 2019-10-31 ENCOUNTER — Other Ambulatory Visit: Payer: Self-pay

## 2019-10-31 ENCOUNTER — Encounter: Payer: Self-pay | Admitting: Internal Medicine

## 2019-10-31 ENCOUNTER — Ambulatory Visit: Payer: Commercial Managed Care - PPO | Admitting: Internal Medicine

## 2019-10-31 VITALS — BP 130/70 | HR 90 | Ht 65.0 in | Wt 205.0 lb

## 2019-10-31 DIAGNOSIS — E041 Nontoxic single thyroid nodule: Secondary | ICD-10-CM

## 2019-10-31 DIAGNOSIS — E0510 Thyrotoxicosis with toxic single thyroid nodule without thyrotoxic crisis or storm: Secondary | ICD-10-CM | POA: Diagnosis not present

## 2019-10-31 DIAGNOSIS — E0590 Thyrotoxicosis, unspecified without thyrotoxic crisis or storm: Secondary | ICD-10-CM

## 2019-10-31 DIAGNOSIS — E785 Hyperlipidemia, unspecified: Secondary | ICD-10-CM

## 2019-10-31 DIAGNOSIS — E119 Type 2 diabetes mellitus without complications: Secondary | ICD-10-CM

## 2019-10-31 MED ORDER — ROSUVASTATIN CALCIUM 5 MG PO TABS: 5.0000 mg | ORAL_TABLET | Freq: Every day | ORAL | 3 refills | Status: DC

## 2019-10-31 NOTE — Progress Notes (Signed)
Patient ID: Rebecca Macias, female   DOB: Oct 01, 1961, 58 y.o.   MRN: [id number]  This visit occurred during the SARS-CoV-2 public health emergency.  Safety protocols were in place, including screening questions prior to the visit, additional usage of staff PPE, and extensive cleaning of exam room while observing appropriate contact time as indicated for disinfecting solutions.   HPI  Rebecca Macias is a 58 y.o.-year-old female, returning for f/u for subclinical hyperthyroidism, R toxic adenoma, and also L thyroid cold nodule. Last visit 1 year and 3 months ago. She cannot see Dr. Deborra Medina anymore after she moved to [facility].  She will see Alma Friendly instead.  First visit next month.  At last visit, she was having palpitations so we increased her atenolol to 25 mg twice a day.  Reviewed and addended history: Pt had an episode of palpitations in 12/2013 >> EKG normal >> TSH low (2 repeat set of thyroid labs confirmed a low TSH in 2015).  Subsequent TFTs have been normal.  Reviewed her TFTs: Lab Results  Component Value Date   TSH 0.69 09/06/2019   TSH 0.70 07/28/2018   TSH 0.88 07/01/2017   TSH 0.60 06/27/2016   TSH 0.62 06/12/2016   TSH 0.44 12/17/2015   TSH 0.50 06/14/2015   TSH 0.80 02/12/2015   TSH 0.42 08/02/2014   TSH 0.17 (L) 05/05/2014   FREET4 1.03 09/06/2019   FREET4 1.10 07/28/2018   FREET4 0.80 06/12/2016   FREET4 0.73 12/17/2015   FREET4 0.87 06/14/2015   FREET4 0.86 02/12/2015   FREET4 0.81 08/02/2014   FREET4 0.78 05/05/2014   FREET4 0.75 04/20/2014   FREET4 0.84 03/23/2014    Thyroid uptake an Scan: 2 defects: one R (hot) and one L (cold).  A thyroid U/S (06/30/2014) showed several small cystic spaces and 2 nodules of similar sizes. The one corresponding to the cold defect was: Ovoid nodule in the mid left lobe measures approximately 1.5 x 1.1 x 1.9 cm.  FNA (09/14/2014) of L nodule: Benign   Thyroid U/S (08/19/2018):  Parenchymal Echotexture: Mildly  heterogenous Isthmus: 0.3 cm, previously 0.4 cm Right lobe: 5.5 x 1.6 x 1.9 cm, previously 6.3 x 1.9 x 2.2 cm Left lobe: 5.5 x 1.5 x 1.7 cm, previously 5.8 x 2.1 x 2.1 cm ____________________________________________________  Nodule # 1: Prior biopsy: No Location: Right; Mid Maximum size: 1.9 cm; Other 2 dimensions: 1.2 x 0.8 cm, previously, 2.1 x 1.6 x 0.8 cm Composition: solid/almost completely solid (2) Echogenicity: hypoechoic (2)  **Given size (>/= 1.5 cm) and appearance, fine needle aspiration of this moderately suspicious nodule should be considered based on TI-RADS criteria. _________________________________________________  Hypoechoic left mid nodule 3 measures 0.9 cm and previously measured up to 1.9 cm. Biopsy was performed previously.  Left upper pole nodule 2 is predominately cystic and does not meet criteria for biopsy nor follow-up. It measures 1.3 cm and previously measured up to 0.9 cm.  IMPRESSION: Right nodule 1 is stable and continues to meet criteria for fine needle aspiration biopsy. Left nodule 2 is cystic and does not meet criteria for biopsy nor follow-up Left nodule 3 is smaller and previously underwent biopsy.  The right thyroid nodule corresponding to the hot nodule on the thyroid scan, so low ThyCA risk, therefore we decided to repeat the ultrasound in a year and then proceed with a biopsy if he changes ultrasound characteristics.  Pt denies: - feeling nodules in neck - hoarseness - dysphagia - choking - SOB with  lying down  She also has DM2, controlled: Lab Results  Component Value Date   HGBA1C 6.4 09/06/2019   HGBA1C 7.6 (A) 07/30/2018   HGBA1C 7.1 (H) 07/01/2017   HGBA1C 6.9 (H) 10/16/2016   HGBA1C 7.9 (H) 07/02/2016   She is on: - Metformin 500 mg 2x a day  She works at [employer].  ROS: Constitutional: no weight gain/no weight loss, no fatigue, no subjective hyperthermia, no subjective hypothermia Eyes: no blurry vision, no  xerophthalmia ENT: no sore throat, + see HPI Cardiovascular: no CP/no SOB/+ palpitations/no leg swelling Respiratory: no cough/no SOB/no wheezing Gastrointestinal: no N/no V/no D/no C/no acid reflux Musculoskeletal: no muscle aches/no joint aches Skin: no rashes, no hair loss Neurological: no tremors/no numbness/no tingling/no dizziness  I reviewed pt's medications, allergies, PMH, social hx, family hx, and changes were documented in the history of present illness. Otherwise, unchanged from my initial visit note.  Past Medical History:  Diagnosis Date  . H/O gestational diabetes mellitus, not currently pregnant   . Ovarian cyst   . Sigmoid diverticulitis 08/2012   Past Surgical History:  Procedure Laterality Date  . BLADDER SUSPENSION  2009  . TUBAL LIGATION     Social History   Socioeconomic History  . Marital status: Divorced    Spouse name: Not on file  . Number of children: Not on file  . Years of education: Not on file  . Highest education level: Not on file  Occupational History  . Not on file  Social Needs  . Financial resource strain: Not on file  . Food insecurity    Worry: Not on file    Inability: Not on file  . Transportation needs    Medical: Not on file    Non-medical: Not on file  Tobacco Use  . Smoking status: Former Smoker    Types: Cigarettes, Cigars    Quit date: 09/19/2011    Years since quitting: 8.1  . Smokeless tobacco: Never Used  Substance and Sexual Activity  . Alcohol use: No  . Drug use: No  . Sexual activity: Not on file  Lifestyle  . Physical activity    Days per week: Not on file    Minutes per session: Not on file  . Stress: Not on file  Relationships  . Social Herbalist on phone: Not on file    Gets together: Not on file    Attends religious service: Not on file    Active member of club or organization: Not on file    Attends meetings of clubs or organizations: Not on file    Relationship status: Not on file  .  Intimate partner violence    Fear of current or ex partner: Not on file    Emotionally abused: Not on file    Physically abused: Not on file    Forced sexual activity: Not on file  Other Topics Concern  . 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   Current Outpatient Medications on File Prior to Visit  Medication Sig Dispense Refill  . atenolol (TENORMIN) 25 MG tablet TAKE 1 TABLET BY MOUTH EVERY DAY 90 tablet 3  . atorvastatin (LIPITOR) 40 MG tablet Take 1 tablet (40 mg total) by mouth every evening. For cholesterol. 90 tablet 1  . Biotin 1000 MCG tablet Take 1,000 mcg by mouth daily.    . cyanocobalamin  500 MCG tablet Take 1,000 mcg by mouth daily. Reported on 06/12/2016    . docusate sodium (STOOL SOFTENER) 100 MG capsule Take 200 mg by mouth daily. Reported on 06/12/2016    . ibuprofen (ADVIL,MOTRIN) 200 MG tablet Take 600 mg by mouth every 6 (six) hours as needed. Reported on 06/12/2016    . metFORMIN (GLUCOPHAGE) 500 MG tablet TAKE 1 TABLET (500 MG TOTAL) BY MOUTH 2 (TWO) TIMES DAILY WITH A MEAL. 180 tablet 3  . rosuvastatin (CRESTOR) 5 MG tablet Take 1 tablet (5 mg total) by mouth every evening. For cholesterol. 90 tablet 0   No current facility-administered medications on file prior to visit.    Allergies  Allergen Reactions  . Flagyl [Metronidazole] Itching   Family History  Problem Relation Age of Onset  . Heart disease Father   . Kidney disease Father   . Diabetes Father   . Diabetes Maternal Grandmother   . Diabetes Paternal Grandmother   . Breast cancer Neg Hx     PE: BP 130/70   Pulse 90   Ht 5\' 5"  (1.651 m)   Wt 205 lb (93 kg)   LMP 11/03/2012   SpO2 97%   BMI 34.11 kg/m  Wt Readings from Last 3 Encounters:  10/31/19 205 lb (93 kg)  09/06/19 202 lb 12 oz (92 kg)  08/19/18 208 lb 8 oz (94.6 kg)   Constitutional: overweight, in NAD Eyes: PERRLA, EOMI, no exophthalmos ENT:  moist mucous membranes, no thyromegaly, no cervical lymphadenopathy Cardiovascular: RRR, No MRG Respiratory: CTA B Gastrointestinal: abdomen soft, NT, ND, BS+ Musculoskeletal: no deformities, strength intact in all 4 Skin: moist, warm, no rashes Neurological: no tremor with outstretched hands, DTR normal in all 4  ASSESSMENT: 1. 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 Relative increased uptake within a right lower pole nodule in thesetting of depressed TSH is concerning for an autonomous nodule. 2. Photopenia in the lower pole of the left lobe suggests a cold nodule. 3. Consider thyroid ultrasound prior to consideration of I 131 therapy for hyperthyroidism. 4. 24 hour I 131 uptake = 18% (normal 10-30%) - 06/30/2014: Thyroid U/S:  Right thyroid lobe Measurements: 6.3 x 1.9 x 2.2 cm. Well-circumscribed, solid mid right thyroid nodule measures approximately 1.6 x 0.8 x 2.1 cm. This is a noncalcified nodule. Tiny cystic areas are present in the inferior right lobe measuring 0.3 and 0.5 cm.   Left thyroid lobe Measurements: 5.8 x 2.1 x 2.1 cm. Ovoid nodule in the mid left lobe measures approximately 1.5 x 1.1 x 1.9 cm. This likely corresponds to the nuclear medicine abnormality. Mildly complex cyst in the superior left lobe measures 0.9 cm. Small cysts in the inferior left lobe measures 0.3 cm.   Isthmus Thickness: 0.4 cm. No nodules visualized.   Lymphadenopathy None visualized.  IMPRESSION:  Bilateral dominant thyroid nodules. Right thyroid nodule measures approximately 1.6 x 0.8 x 2.1 cm and is located in the midportion of  the right lobe. This nodule could be watched or sampled based on size criteria. Nodule in the mid left lobe measures 1.5 x 1.1 x 1.9  cm and likely corresponds to the cold nuclear medicine defect. There would be an indication to biopsy this nodule based on size criteria and nuclear medicine findings.    2. Cold L thyroid nodule - FNA benign  (09/14/2014):  Adequacy Reason Satisfactory For Evaluation. Diagnosis THYROID, FINE NEEDLE ASPIRATION LMP (SPECIMEN 1 OF 1, COLLECTED ON 06/13/14): BENIGN. FINDINGS  CONSISTENT WITH A BENIGN FOLLICULAR NODULE. Enid Cutter MD Pathologist, Electronic Signature (Case signed 09/14/2014)  3. [place] Hyperthyroidism  4. DM2, non-insulin-dependent, without long-term complications   PLAN:  1. And 2. 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  She was found to have a right toxic adenoma, however, her TFTs normalized spontaneously and we decided to just follow this conservatively.  She also has a cold left-sided thyroid nodule that was biopsied in 2015 with benign results.  At last visit, we checked a thyroid ultrasound and this showed stable thyroid nodules. -At this visit, she has no neck compression symptoms -She continues to have palpitations, but these improved in the past after moving the atenolol at night.  At last visit, we increased this from once a day to twice a day.  She still continues 25 mg of atenolol once a day as they are not frequent, and they improved. -she had TFTs checked <2 mo ago >> normal -I will see her back in a year but she may need to return sooner for labs  3. Subclinical hyperthyroidism -Resolved -Latest TFTs were reviewed and these were normal in 08/2019 -We will not recheck her TFTs today  4. DM2 -Diagnosed in 2018 -At last visit, HbA1c was higher, at 7.6%, but since then, she had another HbA1c less than 2 months ago and this has improved to 6.4%. -She was on Metformin before but stopped due to dizziness.  At last visit, we restarted 500 mg twice a day.  She continues this without side effects. -This is followed by PCP   Philemon Kingdom, MD PhD [facility]

## 2019-10-31 NOTE — Patient Instructions (Addendum)
Please continue: - Atenolol 25 mg daily  We will order a new U/S - please let me know if you are not called to schedule it in 1 week.  Please come back for a follow-up appointment in 1 year.

## 2019-11-10 ENCOUNTER — Ambulatory Visit
Admission: RE | Admit: 2019-11-10 | Discharge: 2019-11-10 | Disposition: A | Discharge status: Home / Self Care | Payer: Commercial Managed Care - PPO | Source: Ambulatory Visit | Attending: Primary Care | Admitting: Primary Care

## 2019-11-10 ENCOUNTER — Encounter: Payer: Self-pay | Admitting: Primary Care

## 2019-11-10 ENCOUNTER — Ambulatory Visit: Payer: Commercial Managed Care - PPO | Admitting: Primary Care

## 2019-11-10 ENCOUNTER — Other Ambulatory Visit: Payer: Self-pay

## 2019-11-10 VITALS — BP 128/72 | HR 83 | Temp 97.2°F | Ht 65.0 in | Wt 207.5 lb

## 2019-11-10 DIAGNOSIS — Z1231 Encounter for screening mammogram for malignant neoplasm of breast: Secondary | ICD-10-CM | POA: Diagnosis present

## 2019-11-10 DIAGNOSIS — Z1239 Encounter for other screening for malignant neoplasm of breast: Secondary | ICD-10-CM

## 2019-11-10 DIAGNOSIS — R002 Palpitations: Secondary | ICD-10-CM | POA: Diagnosis not present

## 2019-11-10 LAB — CBC
HCT: 39.9 % (ref 36.0–46.0)
Hemoglobin: 13.7 g/dL (ref 12.0–15.0)
MCHC: 34.2 g/dL (ref 30.0–36.0)
MCV: 89.7 fl (ref 78.0–100.0)
Platelets: 322 10*3/uL (ref 150.0–400.0)
RBC: 4.45 Mil/uL (ref 3.87–5.11)
RDW: 12.9 % (ref 11.5–15.5)
WBC: 6.8 10*3/uL (ref 4.0–10.5)

## 2019-11-10 LAB — BASIC METABOLIC PANEL
BUN: 14 mg/dL (ref 6–23)
CO2: 29 mEq/L (ref 19–32)
Calcium: 10 mg/dL (ref 8.4–10.5)
Chloride: 104 mEq/L (ref 96–112)
Creatinine, Ser: 0.69 mg/dL (ref 0.40–1.20)
GFR: 87.6 mL/min (ref >60.00)
Glucose, Bld: 120 mg/dL — ABNORMAL HIGH (ref 70–99)
Potassium: 4.2 mEq/L (ref 3.5–5.1)
Sodium: 141 mEq/L (ref 135–145)

## 2019-11-10 NOTE — Progress Notes (Signed)
Subjective:    Patient ID: Rebecca Macias, female    DOB: Apr 11, 1961, 57 y.o.   MRN: [id number]  HPI  [name] is a 58 year old female with a history of subclinical hypothyroidism, cold thyroid nodule, type 2 diabetes, GERD, palpitations who presents today with a chief complaint of palpitations.  She is currently managed on atenolol 25 mg daily per endocrinology. During her visit with [place] in late September 2020 she denied palpitations on atenolol.   Since her last visit her symptoms have been intermittent with increased intensity over the last several weeks. She describes her pain as "gas like pain" with belching, sometimes feels lightheaded.  She denies esophageal burning, nausea, abdominal pain.   She is compliant to her atenolol for which she takes at night. She endorses a lot of coffee consumption, has switched to decaffeinated coffee as of one week ago. She doesn't drink any other caffeine. Denies anxiety/stress. She has a family history of heart disease in her father.  Review of Systems  Constitutional: Negative for fatigue.  Respiratory: Negative for shortness of breath.   Cardiovascular: Positive for palpitations. Negative for chest pain.  Gastrointestinal: Negative for abdominal pain and nausea.       Chronic constipation, doing better with stool softener  Neurological: Negative for headaches.       Past Medical History:  Diagnosis Date  . H/O gestational diabetes mellitus, not currently pregnant   . Ovarian cyst   . Palpitations   . Sigmoid diverticulitis 08/2012  . Subclinical hypothyroidism   . Type 2 diabetes mellitus ([place])      Social History   Socioeconomic History  . Marital status: Divorced    Spouse name: Not on file  . Number of children: Not on file  . Years of education: Not on file  . Highest education level: Not on file  Occupational History  . Not on file  Social Needs  . Financial resource strain: Not on file  . Food insecurity    Worry: Not  on file    Inability: Not on file  . Transportation needs    Medical: Not on file    Non-medical: Not on file  Tobacco Use  . Smoking status: Former Smoker    Types: Cigarettes, Cigars    Quit date: 09/19/2011    Years since quitting: 8.1  . Smokeless tobacco: Never Used  Substance and Sexual Activity  . Alcohol use: No  . Drug use: No  . Sexual activity: Not on file  Lifestyle  . Physical activity    Days per week: Not on file    Minutes per session: Not on file  . Stress: Not on file  Relationships  . Social Herbalist on phone: Not on file    Gets together: Not on file    Attends religious service: Not on file    Active member of club or organization: Not on file    Attends meetings of clubs or organizations: Not on file    Relationship status: Not on file  . Intimate partner violence    Fear of current or ex partner: Not on file    Emotionally abused: Not on file    Physically abused: Not on file    Forced sexual activity: Not on file  Other Topics Concern  . 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  Past Surgical History:  Procedure Laterality Date  . BLADDER SUSPENSION  2009  . TUBAL LIGATION      Family History  Problem Relation Age of Onset  . Heart disease Father   . Kidney disease Father   . Diabetes Father   . Diabetes Maternal Grandmother   . Diabetes Paternal Grandmother   . Breast cancer Neg Hx     Allergies  Allergen Reactions  . Flagyl [Metronidazole] Itching    Current Outpatient Medications on File Prior to Visit  Medication Sig Dispense Refill  . atenolol (TENORMIN) 25 MG tablet TAKE 1 TABLET BY MOUTH EVERY DAY 90 tablet 3  . Biotin 1000 MCG tablet Take 1,000 mcg by mouth daily.    . cyanocobalamin 500 MCG tablet Take 1,000 mcg by mouth daily. Reported on 06/12/2016    . docusate sodium (STOOL SOFTENER) 100 MG capsule Take 200 mg by mouth  daily. Reported on 06/12/2016    . ibuprofen (ADVIL,MOTRIN) 200 MG tablet Take 600 mg by mouth every 6 (six) hours as needed. Reported on 06/12/2016    . metFORMIN (GLUCOPHAGE) 500 MG tablet TAKE 1 TABLET (500 MG TOTAL) BY MOUTH 2 (TWO) TIMES DAILY WITH A MEAL. 180 tablet 3  . rosuvastatin (CRESTOR) 5 MG tablet Take 1 tablet (5 mg total) by mouth daily. 90 tablet 3   No current facility-administered medications on file prior to visit.     BP 128/72   Pulse 83   Temp (!) 97.2 F (36.2 C) (Temporal)   Ht 5\' 5"  (1.651 m)   Wt 207 lb 8 oz (94.1 kg)   LMP 11/03/2012   SpO2 98%   BMI 34.53 kg/m    Objective:   Physical Exam  Constitutional: She appears well-nourished.  Neck: Neck supple.  Cardiovascular: Normal rate and regular rhythm.  Respiratory: Effort normal and breath sounds normal.  Skin: Skin is warm and dry.  Psychiatric: She has a normal mood and affect.           Assessment & Plan:

## 2019-11-10 NOTE — Patient Instructions (Signed)
You will be contacted regarding your referral to cardiology.  Please let us know if you have not been contacted within two weeks.   Stop by the lab prior to leaving today. I will notify you of your results once received.   Trial famotidine (Pepcid) 20 mg once or twice daily for belching/gas symptoms.  It was a pleasure to see you today!

## 2019-11-10 NOTE — Assessment & Plan Note (Addendum)
Intermittent, increased intensity over the last several weeks despite Atenolol use.  Labs from late September 2020 grossly unremarkable. Recently evaluated by endocrinology.  ECG today NSR, rate of 78, no PAC/PVC, St changes. Appears very similar to ECG from 2015.  Repeat BMP, add CBC. Add famotidine once to twice daily for belching/gas.  Referral placed to cardiology for Holter Monitor and evaluation.

## 2019-11-25 ENCOUNTER — Encounter: Payer: Self-pay | Admitting: Cardiology

## 2019-11-25 ENCOUNTER — Ambulatory Visit (INDEPENDENT_AMBULATORY_CARE_PROVIDER_SITE_OTHER): Payer: Commercial Managed Care - PPO

## 2019-11-25 ENCOUNTER — Other Ambulatory Visit: Payer: Self-pay

## 2019-11-25 ENCOUNTER — Ambulatory Visit (INDEPENDENT_AMBULATORY_CARE_PROVIDER_SITE_OTHER): Payer: Commercial Managed Care - PPO | Admitting: Cardiology

## 2019-11-25 VITALS — BP 124/81 | HR 82 | Ht 65.0 in | Wt 205.5 lb

## 2019-11-25 DIAGNOSIS — R002 Palpitations: Secondary | ICD-10-CM | POA: Diagnosis not present

## 2019-11-25 DIAGNOSIS — R42 Dizziness and giddiness: Secondary | ICD-10-CM | POA: Diagnosis not present

## 2019-11-25 NOTE — Patient Instructions (Signed)
Medication Instructions:  - Your physician recommends that you continue on your current medications as directed. Please refer to the Current Medication list given to you today.  *If you need a refill on your cardiac medications before your next appointment, please call your pharmacy*  Lab Work: - none ordered  If you have labs (blood work) drawn today and your tests are completely normal, you will receive your results only by: Marland Kitchen MyChart Message (if you have MyChart) OR . A paper copy in the mail If you have any lab test that is abnormal or we need to change your treatment, we will call you to review the results.  Testing/Procedures: - Your physician has recommended that you wear a 14 day Zio monitor (placed in the office today). This monitor is a medical device that records the heart's electrical activity. Doctors most often use these monitors to diagnose arrhythmias. Arrhythmias are problems with the speed or rhythm of the heartbeat. The monitor is a small device applied to your chest. You can wear one while you do your normal daily activities. While wearing this monitor if you have any symptoms to push the button and record what you felt. Once you have worn this monitor for the period of time provider prescribed (Usually 14 days), you will return the monitor device in the postage paid box. Once it is returned they will download the data collected and provide [place] with a report which the provider will then review and we will call you with those results. Important tips:  1. Avoid showering during the first 24 hours of wearing the monitor. 2. Avoid excessive sweating to help maximize wear time. 3. Do not submerge the device, no hot tubs, and no swimming pools. 4. Keep any lotions or oils away from the patch. 5. After 24 hours you may shower with the patch on. Take brief showers with your back facing the shower head.  6. Do not remove patch once it has been placed because that will interrupt data  and decrease adhesive wear time. 7. Push the button when you have any symptoms and write down what you were feeling. 8. Once you have completed wearing your monitor, remove and place into box which has postage paid and place in your outgoing mailbox.  9. If for some reason you have misplaced your box then call our office and we can provide another box and/or mail it off for you.        Follow-Up: At [facility], you and your health needs are our priority.  As part of our continuing mission to provide you with exceptional heart care, we have created designated Provider Care Teams.  These Care Teams include your primary Cardiologist (physician) and Advanced Practice Providers (APPs -  Physician Assistants and Nurse Practitioners) who all work together to provide you with the care you need, when you need it.  Your next appointment:   1 month(s)  The format for your next appointment:   In Person  Provider:   Kate Sable, MD  Other Instructions n/a

## 2019-11-25 NOTE — Progress Notes (Signed)
Cardiology Office Note:    Date:  11/25/2019   ID:  Rebecca Macias, DOB 1961-01-18, MRN ID:[id number]  PCP:  Pleas Koch, NP  Cardiologist:  No primary care provider on file.  Electrophysiologist:  None   Referring MD: Pleas Koch, NP   Chief Complaint  Patient presents with  . New Patient (Initial Visit)    Palpitations/lightheadedness/chest discomfort; Meds verbally reviewed with patient.   Rebecca Macias is a 58 y.o. female who is being seen today for the evaluation of palpitations at the request of Pleas Koch, NP.  History of Present Illness:    Rebecca Macias is a 58 y.o. female with a hx of thyroid disease(thyroid nodules, subclinical hypothyroidism), type 2 diabetes, former smoker x10 years who presents due to palpitations.  Patient states having symptoms of palpitations for about 5 months now.  Symptoms of increased heart rates were worst at onset, lasting 20 minutes.  She states drinking a lot of coffee before, over 5 cups a day.  She has since switched to decaffeinated drinks and this has helped her symptoms tremendously although she occasionally still has symptoms.  She states having a feeling of" gas bubbles in her left chest" which improves after belching.  Has occasional heartburns.  She has been taking atenolol 25 mg daily for years now due to her thyroid disease.  She saw her endocrinologist couple of weeks ago who recommended patient can take an additional tablet of atenolol to help with symptoms.  Patient did not take extra atenolol.  Patient states having dizziness with abrupt head movements or bending down to tie her shoelace and then rising quickly.  Last TSH and free T4 3 months ago were within normal limits.  Past Medical History:  Diagnosis Date  . H/O gestational diabetes mellitus, not currently pregnant   . Ovarian cyst   . Palpitations   . Sigmoid diverticulitis 08/2012  . Subclinical hypothyroidism   . Type 2 diabetes mellitus ([place])      Past Surgical History:  Procedure Laterality Date  . BLADDER SUSPENSION  2009  . SMALL INTESTINE SURGERY    . TUBAL LIGATION      Current Medications: Current Meds  Medication Sig  . atenolol (TENORMIN) 25 MG tablet TAKE 1 TABLET BY MOUTH EVERY DAY  . Biotin 1000 MCG tablet Take 1,000 mcg by mouth daily.  . cyanocobalamin 500 MCG tablet Take 1,000 mcg by mouth daily. Reported on 06/12/2016  . docusate sodium (STOOL SOFTENER) 100 MG capsule Take 200 mg by mouth daily. Reported on 06/12/2016  . ibuprofen (ADVIL,MOTRIN) 200 MG tablet Take 600 mg by mouth every 6 (six) hours as needed. Reported on 06/12/2016  . metFORMIN (GLUCOPHAGE) 500 MG tablet TAKE 1 TABLET (500 MG TOTAL) BY MOUTH 2 (TWO) TIMES DAILY WITH A MEAL.  . rosuvastatin (CRESTOR) 5 MG tablet Take 1 tablet (5 mg total) by mouth daily.     Allergies:   Flagyl [metronidazole]   Social History   Socioeconomic History  . Marital status: Divorced    Spouse name: Not on file  . Number of children: Not on file  . Years of education: Not on file  . Highest education level: Not on file  Occupational History  . Not on file  Tobacco Use  . Smoking status: Former Smoker    Types: Cigarettes, Cigars    Quit date: 09/19/2011    Years since quitting: 8.1  . Smokeless tobacco: Never Used  Substance and Sexual  Activity  . Alcohol use: No  . Drug use: No  . Sexual activity: Not on file  Other Topics Concern  . 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   Social Determinants of Health   Financial Resource Strain:   . Difficulty of Paying Living Expenses: Not on file  Food Insecurity:   . Worried About Charity fundraiser in the Last Year: Not on file  . Ran Out of Food in the Last Year: Not on file  Transportation Needs:   . Lack of Transportation (Medical): Not on file  . Lack of Transportation (Non-Medical): Not on file  Physical  Activity:   . Days of Exercise per Week: Not on file  . Minutes of Exercise per Session: Not on file  Stress:   . Feeling of Stress : Not on file  Social Connections:   . Frequency of Communication with Friends and Family: Not on file  . Frequency of Social Gatherings with Friends and Family: Not on file  . Attends Religious Services: Not on file  . Active Member of Clubs or Organizations: Not on file  . Attends Archivist Meetings: Not on file  . Marital Status: Not on file     Family History: The patient's family history includes Diabetes in her father, maternal grandmother, and paternal grandmother; Heart disease in her father; Kidney disease in her father. There is no history of Breast cancer.  ROS:   Please see the history of present illness.     All other systems reviewed and are negative.  EKGs/Labs/Other Studies Reviewed:    The following studies were reviewed today:  HR BP SYMPTOMS  Lying 80  117/78  none  Sitting 87  102/60  none  Standing (61min) 90 110/70  none  Standing (74min) 95 110/70  none    EKG:  EKG is  ordered today.  The ekg ordered today demonstrates normal sinus rhythm, normal ECG.  Recent Labs: 09/06/2019: TSH 0.69 10/17/2019: ALT 15 11/10/2019: BUN 14; Creatinine, Ser 0.69; Hemoglobin 13.7; Platelets 322.0; Potassium 4.2; Sodium 141  Recent Lipid Panel    Component Value Date/Time   CHOL 222 (H) 10/17/2019 0951   TRIG 266.0 (H) 10/17/2019 0951   HDL 46.60 10/17/2019 0951   CHOLHDL 5 10/17/2019 0951   VLDL 53.2 (H) 10/17/2019 0951   LDLCALC 146 (H) 09/06/2019 0957   LDLDIRECT 138.0 10/17/2019 0951    Physical Exam:    VS:  BP 124/81 (BP Location: Right Arm, Patient Position: Sitting, Cuff Size: Normal)   Pulse 82   Ht 5\' 5"  (1.651 m)   Wt 205 lb 8 oz (93.2 kg)   LMP 11/03/2012   SpO2 96%   BMI 34.20 kg/m     Wt Readings from Last 3 Encounters:  11/25/19 205 lb 8 oz (93.2 kg)  11/10/19 207 lb 8 oz (94.1 kg)  10/31/19 205 lb  (93 kg)     GEN:  Well nourished, well developed in no acute distress HEENT: Normal NECK: No JVD; No carotid bruits LYMPHATICS: No lymphadenopathy CARDIAC: RRR, no murmurs, rubs, gallops RESPIRATORY:  Clear to auscultation without rales, wheezing or rhonchi  ABDOMEN: Soft, non-tender, non-distended MUSCULOSKELETAL:  No edema; No deformity  SKIN: Warm and dry NEUROLOGIC:  Alert and oriented x 3 PSYCHIATRIC:  Normal affect   ASSESSMENT:   Patient with history of thyroid disease presenting with  palpitations.  Symptoms have improved since cutting back on caffeine intake.  Orthostatic blood pressure measurements with no evidence for orthostasis.  Symptoms described are more consistent with positional vertigo.  1. Palpitations   2. Vertigo    PLAN:    In order of problems listed above:  1. Cardiac monitor x2 weeks Avoid abrupt head movements.  Patient can follow-up with PCP for additional meds such as Antivert if deemed necessary.  Follow-up in a month after cardiac monitor  This note was generated in part or whole with voice recognition software. Voice recognition is usually quite accurate but there are transcription errors that can and very often do occur. I apologize for any typographical errors that were not detected and corrected.  Medication Adjustments/Labs and Tests Ordered: Current medicines are reviewed at length with the patient today.  Concerns regarding medicines are outlined above.  Orders Placed This Encounter  Procedures  . LONG TERM MONITOR (3-14 DAYS)  . EKG 12-Lead   No orders of the defined types were placed in this encounter.   Patient Instructions  Medication Instructions:  - Your physician recommends that you continue on your current medications as directed. Please refer to the Current Medication list given to you today.  *If you need a refill on your cardiac medications before your next appointment, please call your pharmacy*  Lab Work: - none  ordered  If you have labs (blood work) drawn today and your tests are completely normal, you will receive your results only by: Marland Kitchen MyChart Message (if you have MyChart) OR . A paper copy in the mail If you have any lab test that is abnormal or we need to change your treatment, we will call you to review the results.  Testing/Procedures: - Your physician has recommended that you wear a 14 day Zio monitor (placed in the office today). This monitor is a medical device that records the heart's electrical activity. Doctors most often use these monitors to diagnose arrhythmias. Arrhythmias are problems with the speed or rhythm of the heartbeat. The monitor is a small device applied to your chest. You can wear one while you do your normal daily activities. While wearing this monitor if you have any symptoms to push the button and record what you felt. Once you have worn this monitor for the period of time provider prescribed (Usually 14 days), you will return the monitor device in the postage paid box. Once it is returned they will download the data collected and provide [place] with a report which the provider will then review and we will call you with those results. Important tips:  1. Avoid showering during the first 24 hours of wearing the monitor. 2. Avoid excessive sweating to help maximize wear time. 3. Do not submerge the device, no hot tubs, and no swimming pools. 4. Keep any lotions or oils away from the patch. 5. After 24 hours you may shower with the patch on. Take brief showers with your back facing the shower head.  6. Do not remove patch once it has been placed because that will interrupt data and decrease adhesive wear time. 7. Push the button when you have any symptoms and write down what you were feeling. 8. Once you have completed wearing your monitor, remove and place into box which has postage paid and place in your outgoing mailbox.  9. If for some reason you have misplaced your box then  call our office and we can provide another box and/or mail it off  for you.        Follow-Up: At [facility], you and your health needs are our priority.  As part of our continuing mission to provide you with exceptional heart care, we have created designated Provider Care Teams.  These Care Teams include your primary Cardiologist (physician) and Advanced Practice Providers (APPs -  Physician Assistants and Nurse Practitioners) who all work together to provide you with the care you need, when you need it.  Your next appointment:   1 month(s)  The format for your next appointment:   In Person  Provider:   Kate Sable, MD  Other Instructions n/a     Signed, Kate Sable, MD  11/25/2019 10:29 AM    [place]

## 2019-11-28 ENCOUNTER — Ambulatory Visit: Payer: Commercial Managed Care - PPO | Admitting: Primary Care

## 2019-11-28 ENCOUNTER — Other Ambulatory Visit (INDEPENDENT_AMBULATORY_CARE_PROVIDER_SITE_OTHER): Payer: Commercial Managed Care - PPO

## 2019-11-28 DIAGNOSIS — E785 Hyperlipidemia, unspecified: Secondary | ICD-10-CM

## 2019-11-28 LAB — LIPID PANEL
Cholesterol: 154 mg/dL (ref 0–200)
HDL: 51.6 mg/dL (ref >39.00)
LDL Cholesterol: 84 mg/dL (ref 0–99)
NonHDL: 102
Total CHOL/HDL Ratio: 3
Triglycerides: 90 mg/dL (ref 0.0–149.0)
VLDL: 18 mg/dL (ref 0.0–40.0)

## 2019-11-28 LAB — HEPATIC FUNCTION PANEL
ALT: 17 U/L (ref 0–35)
AST: 18 U/L (ref 0–37)
Albumin: 4.3 g/dL (ref 3.5–5.2)
Alkaline Phosphatase: 76 U/L (ref 39–117)
Bilirubin, Direct: 0.1 mg/dL (ref 0.0–0.3)
Total Bilirubin: 0.6 mg/dL (ref 0.2–1.2)
Total Protein: 7.2 g/dL (ref 6.0–8.3)

## 2019-12-20 ENCOUNTER — Telehealth: Payer: Self-pay | Admitting: Cardiology

## 2019-12-20 NOTE — Telephone Encounter (Signed)
Unable to reach at both numbers available to reschedule

## 2019-12-20 NOTE — Telephone Encounter (Signed)
-----   Message from Britt Bottom, [place] sent at 12/20/2019  2:06 PM EST ----- See note below r/s appointment. ----- Message ----- From: Truddie Hidden, RN Sent: 12/20/2019  12:45 PM EST To: Britt Bottom, CMA  See if her appointment can be moved out a few days to ensure that her results are ready. Thank you ----- Message ----- From: Britt Bottom, CMA Sent: 12/20/2019  12:38 PM EST To: [facility] Triage  Pt f/u 12/26/2019 w/Dr.Agbor Charlestine Night. Pt f/u zio has not been downloaded. Please advise if ok to keep appointment.

## 2019-12-23 NOTE — Telephone Encounter (Signed)
-----   Message from Britt Bottom, [place] sent at 12/20/2019  2:06 PM EST ----- See note below r/s appointment. ----- Message ----- From: Truddie Hidden, RN Sent: 12/20/2019  12:45 PM EST To: Britt Bottom, CMA  See if her appointment can be moved out a few days to ensure that her results are ready. Thank you ----- Message ----- From: Britt Bottom, CMA Sent: 12/20/2019  12:38 PM EST To: [facility] Triage  Pt f/u 12/26/2019 w/Dr.Agbor Charlestine Night. Pt f/u zio has not been downloaded. Please advise if ok to keep appointment.

## 2019-12-23 NOTE — Telephone Encounter (Signed)
Attempted to reschedule.  No ans no vm.     

## 2019-12-26 ENCOUNTER — Ambulatory Visit: Payer: Commercial Managed Care - PPO | Admitting: Cardiology

## 2020-01-04 ENCOUNTER — Other Ambulatory Visit: Payer: Self-pay | Admitting: Primary Care

## 2020-01-04 DIAGNOSIS — E785 Hyperlipidemia, unspecified: Secondary | ICD-10-CM

## 2020-01-05 ENCOUNTER — Encounter: Payer: Self-pay | Admitting: Cardiology

## 2020-01-05 ENCOUNTER — Ambulatory Visit (INDEPENDENT_AMBULATORY_CARE_PROVIDER_SITE_OTHER): Payer: Commercial Managed Care - PPO | Admitting: Cardiology

## 2020-01-05 ENCOUNTER — Other Ambulatory Visit: Payer: Self-pay

## 2020-01-05 VITALS — BP 120/70 | HR 87 | Ht 65.0 in | Wt 211.5 lb

## 2020-01-05 DIAGNOSIS — E7800 Pure hypercholesterolemia, unspecified: Secondary | ICD-10-CM

## 2020-01-05 DIAGNOSIS — R002 Palpitations: Secondary | ICD-10-CM

## 2020-01-05 NOTE — Telephone Encounter (Signed)
Refill sent to pharmacy.   

## 2020-01-05 NOTE — Progress Notes (Signed)
Cardiology Office Note:    Date:  01/05/2020   ID:  Rebecca Macias, DOB 12-Nov-1961, MRN [id number]  PCP:  Pleas Koch, NP  Cardiologist:  No primary care provider on file.  Electrophysiologist:  None   Referring MD: Pleas Koch, NP   Chief Complaint  Patient presents with  . other    1 month follow up. Meds reviewed by the pt. verbally. "doing well."     History of Present Illness:    Rebecca Macias is a 59 y.o. female with a hx of thyroid disease(thyroid nodules, subclinical hypothyroidism), type 2 diabetes, former smoker x10 years who presents for follow-up.  She was last seen due to a 61-month history of palpitations, typically lasting 20 minutes.  Her symptoms have improved since with cutting back on caffeine.  A cardiac monitor was placed and patient presents for results.     Past Medical History:  Diagnosis Date  . H/O gestational diabetes mellitus, not currently pregnant   . Ovarian cyst   . Palpitations   . Sigmoid diverticulitis 08/2012  . Subclinical hypothyroidism   . Type 2 diabetes mellitus ([place])     Past Surgical History:  Procedure Laterality Date  . BLADDER SUSPENSION  2009  . SMALL INTESTINE SURGERY    . TUBAL LIGATION      Current Medications: Current Meds  Medication Sig  . atenolol (TENORMIN) 25 MG tablet TAKE 1 TABLET BY MOUTH EVERY DAY  . Biotin 1000 MCG tablet Take 1,000 mcg by mouth daily.  . cyanocobalamin 500 MCG tablet Take 1,000 mcg by mouth daily. Reported on 06/12/2016  . docusate sodium (STOOL SOFTENER) 100 MG capsule Take 200 mg by mouth daily. Reported on 06/12/2016  . ibuprofen (ADVIL,MOTRIN) 200 MG tablet Take 600 mg by mouth every 6 (six) hours as needed. Reported on 06/12/2016  . metFORMIN (GLUCOPHAGE) 500 MG tablet TAKE 1 TABLET (500 MG TOTAL) BY MOUTH 2 (TWO) TIMES DAILY WITH A MEAL.  . rosuvastatin (CRESTOR) 5 MG tablet Take 1 tablet (5 mg total) by mouth daily.     Allergies:   Flagyl [metronidazole]   Social  History   Socioeconomic History  . Marital status: Divorced    Spouse name: Not on file  . Number of children: Not on file  . Years of education: Not on file  . Highest education level: Not on file  Occupational History  . Not on file  Tobacco Use  . Smoking status: Former Smoker    Types: Cigarettes, Cigars    Quit date: 09/19/2011    Years since quitting: 8.3  . Smokeless tobacco: Never Used  Substance and Sexual Activity  . Alcohol use: No  . Drug use: No  . Sexual activity: Not on file  Other Topics Concern  . 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   Social Determinants of Health   Financial Resource Strain:   . Difficulty of Paying Living Expenses: Not on file  Food Insecurity:   . Worried About Charity fundraiser in the Last Year: Not on file  . Ran Out of Food in the Last Year: Not on file  Transportation Needs:   . Lack of Transportation (Medical): Not on file  . Lack of Transportation (Non-Medical): Not on file  Physical Activity:   . Days of Exercise per Week: Not on file  . Minutes of Exercise  per Session: Not on file  Stress:   . Feeling of Stress : Not on file  Social Connections:   . Frequency of Communication with Friends and Family: Not on file  . Frequency of Social Gatherings with Friends and Family: Not on file  . Attends Religious Services: Not on file  . Active Member of Clubs or Organizations: Not on file  . Attends Archivist Meetings: Not on file  . Marital Status: Not on file     Family History: The patient's family history includes Diabetes in her father, maternal grandmother, and paternal grandmother; Heart disease in her father; Kidney disease in her father. There is no history of Breast cancer.  ROS:   Please see the history of present illness.     All other systems reviewed and are negative.  EKGs/Labs/Other Studies Reviewed:     Cardiac monitor 11/25/2019 Patient had a min HR of 52 bpm, max HR of 162 bpm, and avg HR of 85 bpm. Predominant underlying rhythm was Sinus Rhythm. One supra ventricular tachycardiac/SVT interval lasting 5 beats noted with a max rate of 162 bpm. Isolated SVEs were rare  (<1.0%), SVE Couplets were rare (<1.0%), and SVE Triplets were rare (<1.0%). no significant arrhythmias noted.  EKG:  EKG is  ordered today.  The ekg ordered today demonstrates normal sinus rhythm, normal ECG.  Recent Labs: 09/06/2019: TSH 0.69 11/10/2019: BUN 14; Creatinine, Ser 0.69; Hemoglobin 13.7; Platelets 322.0; Potassium 4.2; Sodium 141 11/28/2019: ALT 17  Recent Lipid Panel    Component Value Date/Time   CHOL 154 11/28/2019 0927   TRIG 90.0 11/28/2019 0927   HDL 51.60 11/28/2019 0927   CHOLHDL 3 11/28/2019 0927   VLDL 18.0 11/28/2019 0927   LDLCALC 84 11/28/2019 0927   LDLDIRECT 138.0 10/17/2019 0951    Physical Exam:    VS:  BP 120/70 (BP Location: Left Arm, Patient Position: Sitting, Cuff Size: Normal)   Pulse 87   Ht 5\' 5"  (1.651 m)   Wt 211 lb 8 oz (95.9 kg)   LMP 11/03/2012   SpO2 98%   BMI 35.20 kg/m     Wt Readings from Last 3 Encounters:  01/05/20 211 lb 8 oz (95.9 kg)  11/25/19 205 lb 8 oz (93.2 kg)  11/10/19 207 lb 8 oz (94.1 kg)     GEN:  Well nourished, well developed in no acute distress HEENT: Normal NECK: No JVD; No carotid bruits LYMPHATICS: No lymphadenopathy CARDIAC: RRR, no murmurs, rubs, gallops RESPIRATORY:  Clear to auscultation without rales, wheezing or rhonchi  ABDOMEN: Soft, non-tender, non-distended MUSCULOSKELETAL:  No edema; No deformity  SKIN: Warm and dry NEUROLOGIC:  Alert and oriented x 3 PSYCHIATRIC:  Normal affect   ASSESSMENT:     1. Palpitations   2. Pure hypercholesterolemia    PLAN:    In order of problems listed above:  1.  Patient with history of palpitations.  Patient had a 2-week cardiac monitor placed.  Monitor showed one episode of  SVT lasting 5 beats.  No significant arrhythmias noted to explain patient's symptoms.  Patient reassured.  2.  Continue Crestor as prescribed for hyperlipidemia.  Follow-up as needed  This note was generated in part or whole with voice recognition software. Voice recognition is usually quite accurate but there are transcription errors that can and very often do occur. I apologize for any typographical errors that were not detected and corrected.  Medication Adjustments/Labs and Tests Ordered: Current medicines are reviewed at length with  the patient today.  Concerns regarding medicines are outlined above.  Orders Placed This Encounter  Procedures  . EKG 12-Lead   No orders of the defined types were placed in this encounter.   Patient Instructions  Medication Instructions:  Your physician recommends that you continue on your current medications as directed. Please refer to the Current Medication list given to you today.  *If you need a refill on your cardiac medications before your next appointment, please call your pharmacy*  Lab Work: none If you have labs (blood work) drawn today and your tests are completely normal, you will receive your results only by: Marland Kitchen MyChart Message (if you have MyChart) OR . A paper copy in the mail If you have any lab test that is abnormal or we need to change your treatment, we will call you to review the results.  Testing/Procedures: none  Follow-Up: At [facility], you and your health needs are our priority.  As part of our continuing mission to provide you with exceptional heart care, we have created designated Provider Care Teams.  These Care Teams include your primary Cardiologist (physician) and Advanced Practice Providers (APPs -  Physician Assistants and Nurse Practitioners) who all work together to provide you with the care you need, when you need it.  Your next appointment:   As needed.   The format for your next appointment:   In  Person  Provider:   Kate Sable, MD     Signed, Kate Sable, MD  01/05/2020 11:53 AM    [place]

## 2020-01-05 NOTE — Patient Instructions (Signed)
Medication Instructions:  Your physician recommends that you continue on your current medications as directed. Please refer to the Current Medication list given to you today.  *If you need a refill on your cardiac medications before your next appointment, please call your pharmacy*  Lab Work: none If you have labs (blood work) drawn today and your tests are completely normal, you will receive your results only by: . MyChart Message (if you have MyChart) OR . A paper copy in the mail If you have any lab test that is abnormal or we need to change your treatment, we will call you to review the results.  Testing/Procedures: none  Follow-Up: At [facility], you and your health needs are our priority.  As part of our continuing mission to provide you with exceptional heart care, we have created designated Provider Care Teams.  These Care Teams include your primary Cardiologist (physician) and Advanced Practice Providers (APPs -  Physician Assistants and Nurse Practitioners) who all work together to provide you with the care you need, when you need it.  Your next appointment:   As needed.   The format for your next appointment:   In Person  Provider:   Brian Agbor-Etang, MD 

## 2020-01-05 NOTE — Telephone Encounter (Signed)
Last prescribed by Dr Armida Sans on 10/31/2019. Last appointment on 09/06/2019 (CPE) and 11/10/2019 (acute) . No future appointment

## 2020-01-11 NOTE — Telephone Encounter (Signed)
Attempted to schedule no ans no vm  

## 2020-02-02 ENCOUNTER — Encounter: Payer: Self-pay | Admitting: Internal Medicine

## 2020-02-02 LAB — HM DIABETES EYE EXAM

## 2020-06-29 ENCOUNTER — Other Ambulatory Visit: Payer: Self-pay | Admitting: Internal Medicine

## 2020-08-21 ENCOUNTER — Other Ambulatory Visit: Payer: Self-pay | Admitting: Primary Care

## 2020-08-21 DIAGNOSIS — E0590 Thyrotoxicosis, unspecified without thyrotoxic crisis or storm: Secondary | ICD-10-CM

## 2020-08-21 DIAGNOSIS — E785 Hyperlipidemia, unspecified: Secondary | ICD-10-CM

## 2020-08-21 DIAGNOSIS — E119 Type 2 diabetes mellitus without complications: Secondary | ICD-10-CM

## 2020-09-03 ENCOUNTER — Other Ambulatory Visit: Payer: Self-pay

## 2020-09-03 ENCOUNTER — Other Ambulatory Visit (INDEPENDENT_AMBULATORY_CARE_PROVIDER_SITE_OTHER): Payer: Commercial Managed Care - PPO

## 2020-09-03 DIAGNOSIS — E785 Hyperlipidemia, unspecified: Secondary | ICD-10-CM | POA: Diagnosis not present

## 2020-09-03 DIAGNOSIS — E119 Type 2 diabetes mellitus without complications: Secondary | ICD-10-CM | POA: Diagnosis not present

## 2020-09-03 DIAGNOSIS — E0590 Thyrotoxicosis, unspecified without thyrotoxic crisis or storm: Secondary | ICD-10-CM | POA: Diagnosis not present

## 2020-09-03 LAB — LIPID PANEL
Cholesterol: 150 mg/dL (ref 0–200)
HDL: 53.7 mg/dL (ref >39.00)
LDL Cholesterol: 79 mg/dL (ref 0–99)
NonHDL: 96.44
Total CHOL/HDL Ratio: 3
Triglycerides: 85 mg/dL (ref 0.0–149.0)
VLDL: 17 mg/dL (ref 0.0–40.0)

## 2020-09-03 LAB — COMPREHENSIVE METABOLIC PANEL
ALT: 29 U/L (ref 0–35)
AST: 25 U/L (ref 0–37)
Albumin: 4.1 g/dL (ref 3.5–5.2)
Alkaline Phosphatase: 87 U/L (ref 39–117)
BUN: 11 mg/dL (ref 6–23)
CO2: 24 mEq/L (ref 19–32)
Calcium: 9.3 mg/dL (ref 8.4–10.5)
Chloride: 107 mEq/L (ref 96–112)
Creatinine, Ser: 0.7 mg/dL (ref 0.40–1.20)
GFR: 85.91 mL/min (ref >60.00)
Glucose, Bld: 171 mg/dL — ABNORMAL HIGH (ref 70–99)
Potassium: 4.3 mEq/L (ref 3.5–5.1)
Sodium: 141 mEq/L (ref 135–145)
Total Bilirubin: 0.3 mg/dL (ref 0.2–1.2)
Total Protein: 7 g/dL (ref 6.0–8.3)

## 2020-09-03 LAB — TSH: TSH: 1.12 u[IU]/mL (ref 0.35–4.50)

## 2020-09-03 LAB — MICROALBUMIN / CREATININE URINE RATIO
Creatinine,U: 140.9 mg/dL
Microalb Creat Ratio: 0.6 mg/g (ref 0.0–30.0)
Microalb, Ur: 0.8 mg/dL (ref 0.0–1.9)

## 2020-09-03 LAB — HEMOGLOBIN A1C: Hgb A1c MFr Bld: 7.7 % — ABNORMAL HIGH (ref 4.6–6.5)

## 2020-09-03 NOTE — Addendum Note (Signed)
Addended by: Ellamae Sia on: 09/03/2020 09:05 AM   Modules accepted: Orders

## 2020-09-06 ENCOUNTER — Other Ambulatory Visit (HOSPITAL_COMMUNITY)
Admission: RE | Admit: 2020-09-06 | Discharge: 2020-09-06 | Disposition: A | Discharge status: Home / Self Care | Payer: Commercial Managed Care - PPO | Source: Ambulatory Visit | Attending: Primary Care | Admitting: Primary Care

## 2020-09-06 ENCOUNTER — Other Ambulatory Visit: Payer: Self-pay

## 2020-09-06 ENCOUNTER — Ambulatory Visit (INDEPENDENT_AMBULATORY_CARE_PROVIDER_SITE_OTHER): Payer: Commercial Managed Care - PPO | Admitting: Primary Care

## 2020-09-06 ENCOUNTER — Encounter: Payer: Self-pay | Admitting: Primary Care

## 2020-09-06 VITALS — BP 124/68 | HR 85 | Temp 96.3°F | Ht 65.0 in | Wt 215.0 lb

## 2020-09-06 DIAGNOSIS — Z23 Encounter for immunization: Secondary | ICD-10-CM

## 2020-09-06 DIAGNOSIS — E119 Type 2 diabetes mellitus without complications: Secondary | ICD-10-CM

## 2020-09-06 DIAGNOSIS — Z1231 Encounter for screening mammogram for malignant neoplasm of breast: Secondary | ICD-10-CM

## 2020-09-06 DIAGNOSIS — Z124 Encounter for screening for malignant neoplasm of cervix: Secondary | ICD-10-CM

## 2020-09-06 DIAGNOSIS — Z0000 Encounter for general adult medical examination without abnormal findings: Secondary | ICD-10-CM | POA: Diagnosis not present

## 2020-09-06 DIAGNOSIS — E041 Nontoxic single thyroid nodule: Secondary | ICD-10-CM

## 2020-09-06 DIAGNOSIS — E0590 Thyrotoxicosis, unspecified without thyrotoxic crisis or storm: Secondary | ICD-10-CM | POA: Diagnosis not present

## 2020-09-06 DIAGNOSIS — K219 Gastro-esophageal reflux disease without esophagitis: Secondary | ICD-10-CM | POA: Diagnosis not present

## 2020-09-06 DIAGNOSIS — E669 Obesity, unspecified: Secondary | ICD-10-CM

## 2020-09-06 NOTE — Assessment & Plan Note (Signed)
Recent TSH within normal range, follows with endocrinology. Strongly advised she follow up to schedule the ultrasound as ordered last year.

## 2020-09-06 NOTE — Patient Instructions (Addendum)
Start exercising. You should be getting 150 minutes of moderate intensity exercise weekly.  It's important to improve your diet by reducing consumption of fast food, fried food, processed snack foods, sugary drinks. Increase consumption of fresh vegetables and fruits, whole grains, water.  Ensure you are drinking 64 ounces of water daily.  You are due for your mammogram in December 2021.  Schedule a follow up visit with your endocrinologist.  It was a pleasure to see you today!   Live Zoster (Shingles) Vaccine: What You Need to Know 1. Why get vaccinated? Live zoster (shingles) vaccine can prevent shingles. Shingles (also called herpes zoster, or just zoster) is a painful skin rash, usually with blisters. In addition to the rash, shingles can cause fever, headache, chills, or upset stomach. More rarely, shingles can lead to pneumonia, hearing problems, blindness, brain inflammation (encephalitis), or death.  The most common complication of shingles is long-term nerve pain called postherpetic neuralgia (PHN). PHN occurs in the areas where the shingles rash was, even after the rash clears up. It can last for months or years after the rash goes away. The pain from PHN can be severe and debilitating. About 10 to 18% of people who get shingles will experience PHN. The risk of PHN increases with age. An older adult with shingles is more likely to develop PHN and have longer lasting and more severe pain than a younger person with shingles.  Shingles is caused by the varicella zoster virus, the same virus that causes chickenpox. After you have chickenpox, the virus stays in your body and can cause shingles later in life. Shingles cannot be passed from one person to another, but the virus that causes shingles can spread and cause chickenpox in someone who had never had chickenpox or received chickenpox vaccine. 2. Live shingles vaccine Live shingles vaccine can provide protection against shingles and  PHN.  Another type of shingles vaccine, recombinant shingles vaccine, is the preferred vaccine for the prevention of shingles. However, live shingles vaccine may be used in some circumstances (for example if a person is allergic to recombinant shingles vaccine or prefers live shingles vaccine, or if recombinant shingles vaccine is not available). Adults 60 years and older who get live shingles vaccine should receive 1 dose, administered by injection. Shingles vaccine may be given at the same time as other vaccines. 3. Talk with your health care provider Tell your vaccine provider if the person getting the vaccine:  Has had an allergic reaction after a previous dose of live shingles vaccine or varicella vaccine, or has any severe, life-threatening allergies.  Has a weakened immune system.  Is pregnant or thinks she might be pregnant.  Is currently experiencing an episode of shingles. In some cases, your health care provider may decide to postpone shingles vaccination to a future visit.  People with minor illnesses, such as a cold, may be vaccinated. People who are moderately or severely ill should usually wait until they recover before getting live shingles vaccine.  Your health care provider can give you more information. 4. Risks of a vaccine reaction  Redness, soreness, swelling, or itching at the site of the injection and headache can happen after live shingles vaccine. Rarely, live shingles vaccine can cause rash or shingles.  People sometimes faint after medical procedures, including vaccination. Tell your provider if you feel dizzy or have vision changes or ringing in the ears.  As with any medicine, there is a very remote chance of a vaccine causing a severe  allergic reaction, other serious injury, or death. 5. What if there is a serious problem? An allergic reaction could occur after the vaccinated person leaves the clinic. If you see signs of a severe allergic reaction (hives,  swelling of the face and throat, difficulty breathing, a fast heartbeat, dizziness, or weakness), call 9-1-1 and get the person to the nearest hospital.  For other signs that concern you, call your health care provider.  Adverse reactions should be reported to the Vaccine Adverse Event Reporting System (VAERS). Your health care provider will usually file this report, or you can do it yourself. Visit the VAERS website at www.vaers.SamedayNews.es or call 331-067-2177. VAERS is only for reporting reactions, and VAERS staff do not give medical advice. 6. How can I learn more?  Ask your health care provider.  Call your local or state health department.  Contact the Centers for Disease Control and Prevention (CDC): ? Call 212-149-6618 (1-800-CDC-INFO) or ? Visit CDC's website at http://hunter.com/ CDC Vaccine Information Statement Live Zoster Vaccine (10/06/2018) This information is not intended to replace advice given to you by your health care provider. Make sure you discuss any questions you have with your health care provider. Document Revised: 03/15/2019 Document Reviewed: 07/06/2018 Elsevier Patient Education  [place].

## 2020-09-06 NOTE — Assessment & Plan Note (Signed)
Shingrix vaccine due, first dose provided today. Will get flu shot at work. Pap smear due, completed today. Mammogram due in December 2020. Colonoscopy UTD, due in 2023.  Discussed the importance of a healthy diet and regular exercise in order for weight loss, and to reduce the risk of any potential medical problems.  Exam today unremarkable. Labs reviewed.

## 2020-09-06 NOTE — Addendum Note (Signed)
Addended by: Francella Solian on: 09/06/2020 10:20 AM   Modules accepted: Orders

## 2020-09-06 NOTE — Progress Notes (Signed)
Subjective:    Patient ID: Rebecca Macias, female    DOB: March 28, 1961, 60 y.o.   MRN: [id number]  HPI  This visit occurred during the SARS-CoV-2 public health emergency.  Safety protocols were in place, including screening questions prior to the visit, additional usage of staff PPE, and extensive cleaning of exam room while observing appropriate contact time as indicated for disinfecting solutions.   Rebecca Macias is a 59 year old female who presents today for complete physical.  Immunizations: -Tetanus: Completed in 2016 -Influenza: Will get at work -Shingles: Never completed  -Pneumonia: Completed last in 2018 -Covid-19: Completed series  Diet: She endorses a fair diet.  Exercise: She is not exercising  Eye exam: UTD Dental exam: Completes semi-annually   Pap Smear: Completed in 2018, due Mammogram: Completed in December 2020 Colonoscopy: Completed in 2013 Hep C Screen: Negative  BP Readings from Last 3 Encounters:  09/06/20 124/68  01/05/20 120/70  11/25/19 124/81   Wt Readings from Last 3 Encounters:  09/06/20 215 lb (97.5 kg)  01/05/20 211 lb 8 oz (95.9 kg)  11/25/19 205 lb 8 oz (93.2 kg)     Review of Systems  Constitutional: Negative for unexpected weight change.  HENT: Negative for rhinorrhea.   Eyes: Negative for visual disturbance.  Respiratory: Negative for cough and shortness of breath.   Cardiovascular: Negative for chest pain.  Gastrointestinal: Negative for constipation and diarrhea.  Genitourinary: Negative for difficulty urinating.  Musculoskeletal: Positive for arthralgias.  Skin: Negative for rash.  Allergic/Immunologic: Positive for environmental allergies.  Neurological: Negative for dizziness and numbness.  Psychiatric/Behavioral: The patient is not nervous/anxious.        Past Medical History:  Diagnosis Date  . H/O gestational diabetes mellitus, not currently pregnant   . Ovarian cyst   . Palpitations   . Sigmoid diverticulitis 08/2012   . Subclinical hypothyroidism   . Type 2 diabetes mellitus ([place])      Social History   Socioeconomic History  . Marital status: Divorced    Spouse name: Not on file  . Number of children: Not on file  . Years of education: Not on file  . Highest education level: Not on file  Occupational History  . Not on file  Tobacco Use  . Smoking status: Former Smoker    Types: Cigarettes, Cigars    Quit date: 09/19/2011    Years since quitting: 8.9  . Smokeless tobacco: Never Used  Vaping Use  . Vaping Use: Former  Substance and Sexual Activity  . Alcohol use: No  . Drug use: No  . Sexual activity: Not on file  Other Topics Concern  . 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   Social Determinants of Health   Financial Resource Strain:   . Difficulty of Paying Living Expenses: Not on file  Food Insecurity:   . Worried About Charity fundraiser in the Last Year: Not on file  . Ran Out of Food in the Last Year: Not on file  Transportation Needs:   . Lack of Transportation (Medical): Not on file  . Lack of Transportation (Non-Medical): Not on file  Physical Activity:   . Days of Exercise per Week: Not on file  . Minutes of Exercise per Session: Not on file  Stress:   . Feeling of Stress : Not on file  Social Connections:   . Frequency  of Communication with Friends and Family: Not on file  . Frequency of Social Gatherings with Friends and Family: Not on file  . Attends Religious Services: Not on file  . Active Member of Clubs or Organizations: Not on file  . Attends Archivist Meetings: Not on file  . Marital Status: Not on file  Intimate Partner Violence:   . Fear of Current or Ex-Partner: Not on file  . Emotionally Abused: Not on file  . Physically Abused: Not on file  . Sexually Abused: Not on file    Past Surgical History:  Procedure Laterality Date  . BLADDER SUSPENSION   2009  . SMALL INTESTINE SURGERY    . TUBAL LIGATION      Family History  Problem Relation Age of Onset  . Heart disease Father   . Kidney disease Father   . Diabetes Father   . Diabetes Maternal Grandmother   . Diabetes Paternal Grandmother   . Breast cancer Neg Hx     Allergies  Allergen Reactions  . Flagyl [Metronidazole] Itching    Current Outpatient Medications on File Prior to Visit  Medication Sig Dispense Refill  . atenolol (TENORMIN) 25 MG tablet TAKE 1 TABLET BY MOUTH EVERY DAY 90 tablet 3  . Biotin 1000 MCG tablet Take 1,000 mcg by mouth daily.    . cyanocobalamin 500 MCG tablet Take 1,000 mcg by mouth daily. Reported on 06/12/2016    . docusate sodium (STOOL SOFTENER) 100 MG capsule Take 200 mg by mouth daily. Reported on 06/12/2016    . ibuprofen (ADVIL,MOTRIN) 200 MG tablet Take 600 mg by mouth every 6 (six) hours as needed. Reported on 06/12/2016    . metFORMIN (GLUCOPHAGE) 500 MG tablet TAKE 1 TABLET (500 MG TOTAL) BY MOUTH 2 (TWO) TIMES DAILY WITH A MEAL. 180 tablet 1  . rosuvastatin (CRESTOR) 5 MG tablet TAKE 1 TABLET (5 MG TOTAL) BY MOUTH EVERY EVENING. FOR CHOLESTEROL. 90 tablet 3   No current facility-administered medications on file prior to visit.    BP 124/68   Pulse 85   Temp (!) 96.3 F (35.7 C) (Temporal)   Ht 5\' 5"  (1.651 m)   Wt 215 lb (97.5 kg)   LMP 11/03/2012   SpO2 97%   BMI 35.78 kg/m    Objective:   Physical Exam HENT:     Right Ear: Tympanic membrane and ear canal normal.     Left Ear: Tympanic membrane and ear canal normal.  Eyes:     Pupils: Pupils are equal, round, and reactive to light.  Cardiovascular:     Rate and Rhythm: Normal rate and regular rhythm.  Pulmonary:     Effort: Pulmonary effort is normal.     Breath sounds: Normal breath sounds.  Abdominal:     General: Bowel sounds are normal.     Palpations: Abdomen is soft.     Tenderness: There is no abdominal tenderness.  Genitourinary:    Labia:        Right: No  tenderness or lesion.        Left: No tenderness or lesion.      Cervix: No cervical motion tenderness, discharge or lesion.     Uterus: Normal.      Adnexa: Right adnexa normal and left adnexa normal.  Musculoskeletal:        General: Normal range of motion.     Cervical back: Neck supple.  Skin:    General: Skin is warm and dry.  Neurological:     Mental Status: She is alert and oriented to person, place, and time.     Cranial Nerves: No cranial nerve deficit.     Deep Tendon Reflexes:     Reflex Scores:      Patellar reflexes are 2+ on the right side and 2+ on the left side. Psychiatric:        Mood and Affect: Mood normal.            Assessment & Plan:

## 2020-09-06 NOTE — Addendum Note (Signed)
Addended by: Pleas Koch on: 09/06/2020 10:19 AM   Modules accepted: Orders

## 2020-09-06 NOTE — Assessment & Plan Note (Signed)
Significant increase in A1C to 7.7 from 6.4 last year. Follows with endocrinology, and is due for an appointment.  We discussed the absolute need to improve her diet, she admits to a poor diet over the last year. We also discussed to start exercising.  Would recommend increase in Metformin to 1000 mg BID, but she will follow up with her endocrinologist first. She will also work on her diet.

## 2020-09-06 NOTE — Assessment & Plan Note (Signed)
Following with endocrinology, managed on atenolol. Recent TSH unremarkable. Strongly advised she follow up to review need for ultrasound vs biopsy as ordered.

## 2020-09-06 NOTE — Assessment & Plan Note (Signed)
Infrequent symptoms, tries to avoid triggers.

## 2020-09-06 NOTE — Assessment & Plan Note (Signed)
Discussed the importance of a healthy diet and regular exercise in order for weight loss, and to reduce the risk of any potential medical problems.  

## 2020-09-10 LAB — CYTOLOGY - PAP
Comment: NEGATIVE
Diagnosis: NEGATIVE
High risk HPV: NEGATIVE

## 2020-10-25 ENCOUNTER — Other Ambulatory Visit: Payer: Self-pay

## 2020-10-25 ENCOUNTER — Ambulatory Visit (INDEPENDENT_AMBULATORY_CARE_PROVIDER_SITE_OTHER): Payer: Commercial Managed Care - PPO | Admitting: Internal Medicine

## 2020-10-25 ENCOUNTER — Encounter: Payer: Self-pay | Admitting: Internal Medicine

## 2020-10-25 VITALS — BP 140/70 | HR 86 | Ht 65.0 in | Wt 215.4 lb

## 2020-10-25 DIAGNOSIS — E0590 Thyrotoxicosis, unspecified without thyrotoxic crisis or storm: Secondary | ICD-10-CM | POA: Diagnosis not present

## 2020-10-25 DIAGNOSIS — E041 Nontoxic single thyroid nodule: Secondary | ICD-10-CM | POA: Diagnosis not present

## 2020-10-25 DIAGNOSIS — E0510 Thyrotoxicosis with toxic single thyroid nodule without thyrotoxic crisis or storm: Secondary | ICD-10-CM

## 2020-10-25 NOTE — Patient Instructions (Signed)
Please continue: - Atenolol 25 mg daily  Please come back for a follow-up appointment in 1 year.

## 2020-10-25 NOTE — Progress Notes (Signed)
Patient ID: Rebecca Macias, female   DOB: 28-Jul-1961, 59 y.o.   MRN: [id number]  This visit occurred during the SARS-CoV-2 public health emergency.  Safety protocols were in place, including screening questions prior to the visit, additional usage of staff PPE, and extensive cleaning of exam room while observing appropriate contact time as indicated for disinfecting solutions.   HPI  Rebecca Macias is a 59 y.o.-year-old female, returning for f/u for subclinical hyperthyroidism, R toxic adenoma, and also L thyroid cold nodule. Last visit 1 year ago.  Reviewed and addended history: Pt had an episode of palpitations in 12/2013 >> EKG normal >> TSH low (2 repeat set of thyroid labs confirmed a low TSH in 2015).  Subsequent TFTs have been normal.  Reviewed her TFTs: Lab Results  Component Value Date   TSH 1.12 09/03/2020   TSH 0.69 09/06/2019   TSH 0.70 07/28/2018   TSH 0.88 07/01/2017   TSH 0.60 06/27/2016   TSH 0.62 06/12/2016   TSH 0.44 12/17/2015   TSH 0.50 06/14/2015   TSH 0.80 02/12/2015   TSH 0.42 08/02/2014   FREET4 1.03 09/06/2019   FREET4 1.10 07/28/2018   FREET4 0.80 06/12/2016   FREET4 0.73 12/17/2015   FREET4 0.87 06/14/2015   FREET4 0.86 02/12/2015   FREET4 0.81 08/02/2014   FREET4 0.78 05/05/2014   FREET4 0.75 04/20/2014   FREET4 0.84 03/23/2014    Thyroid uptake an Scan: 2 defects: one R (hot) and one L (cold).  A thyroid U/S (06/30/2014) showed several small cystic spaces and 2 nodules of similar sizes. The one corresponding to the cold defect was: Ovoid nodule in the mid left lobe measures approximately 1.5 x 1.1 x 1.9 cm.  FNA (09/14/2014) of L nodule: Benign   Thyroid U/S (08/19/2018):  Parenchymal Echotexture: Mildly heterogenous Isthmus: 0.3 cm, previously 0.4 cm Right lobe: 5.5 x 1.6 x 1.9 cm, previously 6.3 x 1.9 x 2.2 cm Left lobe: 5.5 x 1.5 x 1.7 cm, previously 5.8 x 2.1 x 2.1 cm ____________________________________________________  Nodule #  1: Prior biopsy: No Location: Right; Mid Maximum size: 1.9 cm; Other 2 dimensions: 1.2 x 0.8 cm, previously, 2.1 x 1.6 x 0.8 cm Composition: solid/almost completely solid (2) Echogenicity: hypoechoic (2)  **Given size (>/= 1.5 cm) and appearance, fine needle aspiration of this moderately suspicious nodule should be considered based on TI-RADS criteria. _________________________________________________  Hypoechoic left mid nodule 3 measures 0.9 cm and previously measured up to 1.9 cm. Biopsy was performed previously.  Left upper pole nodule 2 is predominately cystic and does not meet criteria for biopsy nor follow-up. It measures 1.3 cm and previously measured up to 0.9 cm.  IMPRESSION: Right nodule 1 is stable and continues to meet criteria for fine needle aspiration biopsy. Left nodule 2 is cystic and does not meet criteria for biopsy nor follow-up Left nodule 3 is smaller and previously underwent biopsy.  The right thyroid nodule corresponding to the hot nodule on the thyroid scan, so low ThyCA risk, therefore, we decided to repeat the ultrasound and then proceed with a biopsy if this changed ultrasound characteristics.    At last visit, I ordered a new thyroid ultrasound but she did not have this yet.  Pt denies: - feeling nodules in neck - hoarseness - dysphagia - choking - SOB with lying down  She also has type 2 diabetes  - she was stress eating recently >> HbA1c higher.  This is managed by PCP: Lab Results  Component Value  Date   HGBA1C 7.7 (H) 09/03/2020   HGBA1C 6.4 09/06/2019   HGBA1C 7.6 (A) 07/30/2018   HGBA1C 7.1 (H) 07/01/2017   HGBA1C 6.9 (H) 10/16/2016   HGBA1C 7.9 (H) 07/02/2016   She continues on Metformin - now 2x a day. She tells me her sister is on Ozempic and did well from the diabetes and overweight point of view after she started this.  She plans to discuss with PCP about possibly starting this.  She works at [employer].  ROS: Constitutional: no weight gain/no weight loss, no fatigue, no subjective hyperthermia, no subjective hypothermia Eyes: no blurry vision, no xerophthalmia ENT: no sore throat, + see HPI Cardiovascular: no CP/no SOB/+ palpitations/no leg swelling Respiratory: no cough/no SOB/no wheezing Gastrointestinal: no N/no V/no D/no C/no acid reflux Musculoskeletal: no muscle aches/no joint aches Skin: no rashes, no hair loss Neurological: no tremors/no numbness/no tingling/no dizziness  I reviewed pt's medications, allergies, PMH, social hx, family hx, and changes were documented in the history of present illness. Otherwise, unchanged from my initial visit note.  Past Medical History:  Diagnosis Date  . H/O gestational diabetes mellitus, not currently pregnant   . Ovarian cyst   . Palpitations   . Sigmoid diverticulitis 08/2012  . Subclinical hypothyroidism   . Type 2 diabetes mellitus ([place])    Past Surgical History:  Procedure Laterality Date  . BLADDER SUSPENSION  2009  . SMALL INTESTINE SURGERY    . TUBAL LIGATION     Social History   Socioeconomic History  . Marital status: Divorced    Spouse name: Not on file  . Number of children: Not on file  . Years of education: Not on file  . Highest education level: Not on file  Occupational History  . Not on file  Tobacco Use  . Smoking status: Former Smoker    Types: Cigarettes, Cigars    Quit date: 09/19/2011    Years since quitting: 9.1  . Smokeless tobacco: Never Used  Vaping Use  . Vaping Use: Former  Substance and Sexual Activity  . Alcohol use: No  . Drug use: No  . Sexual activity: Not on file  Other Topics Concern  . 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   Social Determinants of Health   Financial Resource Strain:   . Difficulty of Paying Living Expenses: Not on file  Food Insecurity:   . Worried About Paediatric nurse in the Last Year: Not on file  . Ran Out of Food in the Last Year: Not on file  Transportation Needs:   . Lack of Transportation (Medical): Not on file  . Lack of Transportation (Non-Medical): Not on file  Physical Activity:   . Days of Exercise per Week: Not on file  . Minutes of Exercise per Session: Not on file  Stress:   . Feeling of Stress : Not on file  Social Connections:   . Frequency of Communication with Friends and Family: Not on file  . Frequency of Social Gatherings with Friends and Family: Not on file  . Attends Religious Services: Not on file  . Active Member of Clubs or Organizations: Not on file  . Attends Archivist Meetings: Not on file  . Marital Status: Not on file  Intimate Partner Violence:   . Fear of Current or Ex-Partner: Not on file  . Emotionally Abused:  Not on file  . Physically Abused: Not on file  . Sexually Abused: Not on file   Current Outpatient Medications on File Prior to Visit  Medication Sig Dispense Refill  . atenolol (TENORMIN) 25 MG tablet TAKE 1 TABLET BY MOUTH EVERY DAY 90 tablet 3  . Biotin 1000 MCG tablet Take 1,000 mcg by mouth daily.    . cyanocobalamin 500 MCG tablet Take 1,000 mcg by mouth daily. Reported on 06/12/2016    . docusate sodium (STOOL SOFTENER) 100 MG capsule Take 200 mg by mouth daily. Reported on 06/12/2016    . ibuprofen (ADVIL,MOTRIN) 200 MG tablet Take 600 mg by mouth every 6 (six) hours as needed. Reported on 06/12/2016    . metFORMIN (GLUCOPHAGE) 500 MG tablet TAKE 1 TABLET (500 MG TOTAL) BY MOUTH 2 (TWO) TIMES DAILY WITH A MEAL. 180 tablet 1  . rosuvastatin (CRESTOR) 5 MG tablet TAKE 1 TABLET (5 MG TOTAL) BY MOUTH EVERY EVENING. FOR CHOLESTEROL. 90 tablet 3   No current facility-administered medications on file prior to visit.   Allergies  Allergen Reactions  . Flagyl [Metronidazole] Itching   Family History  Problem Relation Age of Onset  . Heart disease Father   . Kidney disease Father    . Diabetes Father   . Diabetes Maternal Grandmother   . Diabetes Paternal Grandmother   . Breast cancer Neg Hx     PE: BP 140/70   Pulse 86   Ht 5\' 5"  (1.651 m)   Wt 215 lb 6.4 oz (97.7 kg)   LMP 11/03/2012   SpO2 96%   BMI 35.84 kg/m  Wt Readings from Last 3 Encounters:  10/25/20 215 lb 6.4 oz (97.7 kg)  09/06/20 215 lb (97.5 kg)  01/05/20 211 lb 8 oz (95.9 kg)   Constitutional: overweight, in NAD Eyes: PERRLA, EOMI, no exophthalmos ENT: moist mucous membranes, no thyromegaly, no cervical lymphadenopathy Cardiovascular: RRR, No MRG Respiratory: CTA B Gastrointestinal: abdomen soft, NT, ND, BS+ Musculoskeletal: no deformities, strength intact in all 4 Skin: moist, warm, no rashes Neurological: no tremor with outstretched hands, DTR normal in all 4  ASSESSMENT: 1. 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 Relative increased uptake within a right lower pole nodule in thesetting of depressed TSH is concerning for an autonomous nodule. 2. Photopenia in the lower pole of the left lobe suggests a cold nodule. 3. Consider thyroid ultrasound prior to consideration of I 131 therapy for hyperthyroidism. 4. 24 hour I 131 uptake = 18% (normal 10-30%) - 06/30/2014: Thyroid U/S:  Right thyroid lobe Measurements: 6.3 x 1.9 x 2.2 cm. Well-circumscribed, solid mid right thyroid nodule measures approximately 1.6 x 0.8 x 2.1 cm. This is a noncalcified nodule. Tiny cystic areas are present in the inferior right lobe measuring 0.3 and 0.5 cm.   Left thyroid lobe Measurements: 5.8 x 2.1 x 2.1 cm. Ovoid nodule in the mid left lobe measures approximately 1.5 x 1.1 x 1.9 cm. This likely corresponds to the nuclear medicine abnormality. Mildly complex cyst in the superior left lobe measures 0.9 cm. Small cysts in the inferior left lobe measures 0.3 cm.   Isthmus Thickness: 0.4 cm. No nodules visualized.   Lymphadenopathy None visualized.  IMPRESSION:  Bilateral dominant thyroid  nodules. Right thyroid nodule measures approximately 1.6 x 0.8 x 2.1 cm and is located in the midportion of  the right lobe. This nodule could be watched or sampled based on size criteria. Nodule in the mid left lobe  measures 1.5 x 1.1 x 1.9  cm and likely corresponds to the cold nuclear medicine defect. There would be an indication to biopsy this nodule based on size criteria and nuclear medicine findings.    2. Cold L thyroid nodule - FNA benign (09/14/2014):  Adequacy Reason Satisfactory For Evaluation. Diagnosis THYROID, FINE NEEDLE ASPIRATION LMP (SPECIMEN 1 OF 1, COLLECTED ON 06/13/14): BENIGN. FINDINGS CONSISTENT WITH A BENIGN FOLLICULAR NODULE. Enid Cutter MD Pathologist, Electronic Signature (Case signed 09/14/2014)  3. [place] Hyperthyroidism  PLAN:  1. And 2.  Right toxic adenoma and: Left thyroid nodule -Patient has a history of subclinical hypothyroidism with history of thyrotoxic symptoms: Heat intolerance, palpitations (improved ago).  She was found to have a right toxic adenoma, however, her TFTs normalized spontaneously and we decided to just follow this conservatively.  She also has a cold left-sided thyroid nodule that was biopsied in 2015 with benign results.  In 2019 we checked a thyroid ultrasound and this showed stable nodules.  I ordered another ultrasound last year but she did not have this done.  I reordered this today and I advised her to stop downstairs in [place] [facility] to schedule it -She does not have neck compression symptoms -She continues to have some palpitations, much improved - she continues on atenolol 25 mg daily -She had TFTs checked less than 2 months ago and these were normal -I will see her back in a year   3. Subclinical hyperthyroidism -Resolved -At today's visit, she has no thyrotoxic symptoms: No weight loss, heat intolerance, tremors, increased palpitations -Latest TFTs were reviewed and were normal 09/03/2020 -We will not recheck these  today.  Orders Placed This Encounter  Procedures  . US THYROID   Philemon Kingdom, MD PhD [facility] Endocrinology

## 2020-10-27 ENCOUNTER — Encounter: Payer: Self-pay | Admitting: Internal Medicine

## 2020-10-29 MED ORDER — ATENOLOL 25 MG PO TABS
25.0000 mg | ORAL_TABLET | Freq: Every day | ORAL | 0 refills | Status: DC
Start: 2020-10-29 — End: 2021-01-18

## 2020-11-08 ENCOUNTER — Ambulatory Visit (INDEPENDENT_AMBULATORY_CARE_PROVIDER_SITE_OTHER): Payer: Commercial Managed Care - PPO

## 2020-11-08 ENCOUNTER — Other Ambulatory Visit: Payer: Self-pay

## 2020-11-08 VITALS — BP 122/60 | HR 88 | Temp 97.5°F | Ht 65.0 in | Wt 217.0 lb

## 2020-11-08 DIAGNOSIS — Z23 Encounter for immunization: Secondary | ICD-10-CM

## 2020-11-08 NOTE — Progress Notes (Signed)
Per orders of Allie Bossier, NP, injection of 2nd Shingrix, given by Aneta Mins, RN. Patient tolerated injection well in R Deltoid.

## 2020-12-20 ENCOUNTER — Other Ambulatory Visit: Payer: Self-pay

## 2020-12-20 ENCOUNTER — Ambulatory Visit
Admission: RE | Admit: 2020-12-20 | Discharge: 2020-12-20 | Disposition: A | Discharge status: Home / Self Care | Payer: Commercial Managed Care - PPO | Source: Ambulatory Visit | Attending: Primary Care | Admitting: Primary Care

## 2020-12-20 DIAGNOSIS — Z1231 Encounter for screening mammogram for malignant neoplasm of breast: Secondary | ICD-10-CM

## 2020-12-25 ENCOUNTER — Ambulatory Visit
Admission: RE | Admit: 2020-12-25 | Discharge: 2020-12-25 | Disposition: A | Discharge status: Home / Self Care | Payer: Commercial Managed Care - PPO | Source: Ambulatory Visit | Attending: Internal Medicine | Admitting: Internal Medicine

## 2020-12-25 DIAGNOSIS — E0510 Thyrotoxicosis with toxic single thyroid nodule without thyrotoxic crisis or storm: Secondary | ICD-10-CM

## 2020-12-25 DIAGNOSIS — E041 Nontoxic single thyroid nodule: Secondary | ICD-10-CM

## 2021-01-05 ENCOUNTER — Other Ambulatory Visit: Payer: Self-pay | Admitting: Internal Medicine

## 2021-01-07 ENCOUNTER — Other Ambulatory Visit: Payer: Self-pay | Admitting: Primary Care

## 2021-01-07 DIAGNOSIS — E785 Hyperlipidemia, unspecified: Secondary | ICD-10-CM

## 2021-01-18 ENCOUNTER — Other Ambulatory Visit: Payer: Self-pay | Admitting: Internal Medicine

## 2021-01-28 ENCOUNTER — Other Ambulatory Visit: Payer: Self-pay | Admitting: Primary Care

## 2021-01-28 DIAGNOSIS — E785 Hyperlipidemia, unspecified: Secondary | ICD-10-CM

## 2021-02-11 IMAGING — MG DIGITAL SCREENING BILAT W/ TOMO W/ CAD
8 series · 8 of 24 positions shown · non-contrast
Comparison: Previous exam(s).

CLINICAL DATA: Screening.

EXAM:
DIGITAL SCREENING BILATERAL MAMMOGRAM WITH TOMO AND CAD

[R CC synth-2D]
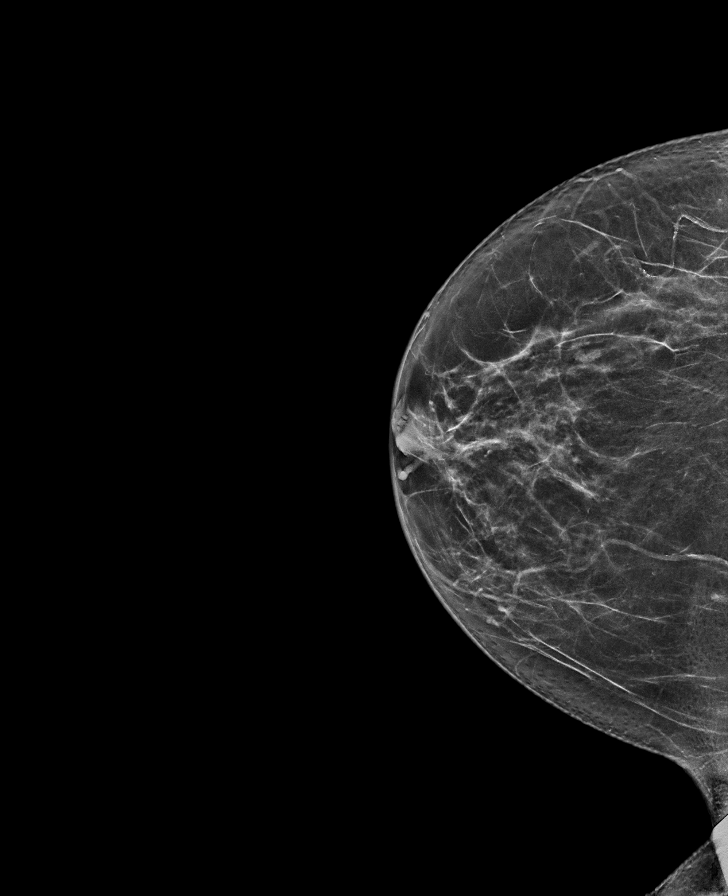

[R MLO synth-2D]
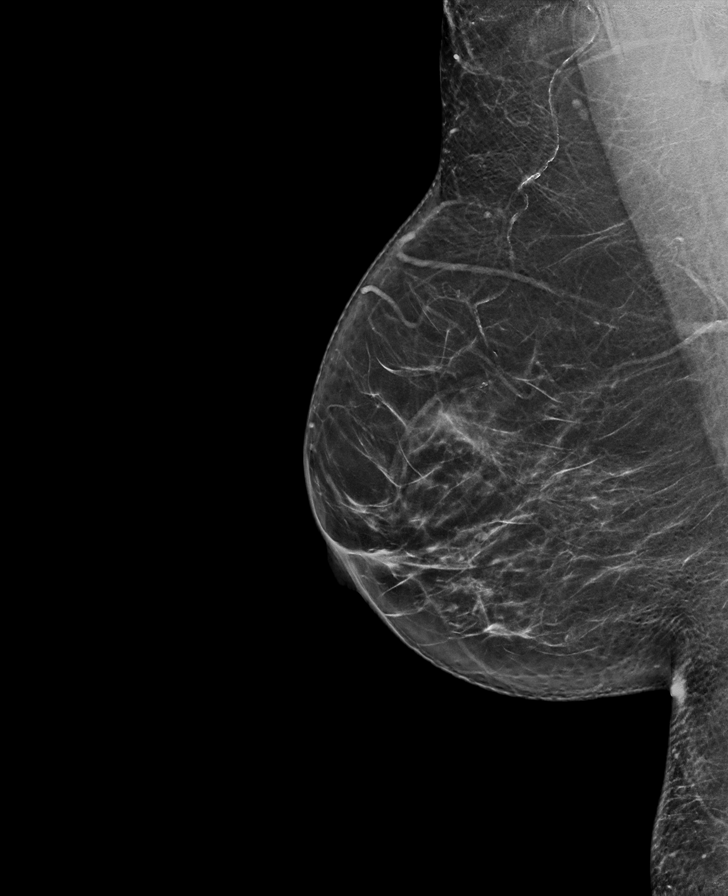

[L CC synth-2D]
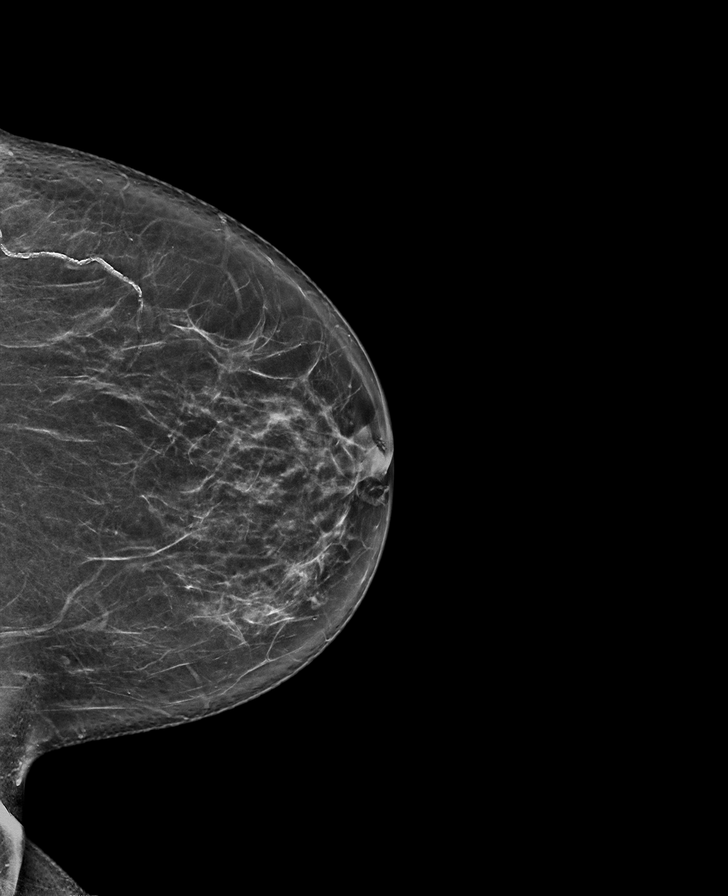

[L MLO synth-2D]
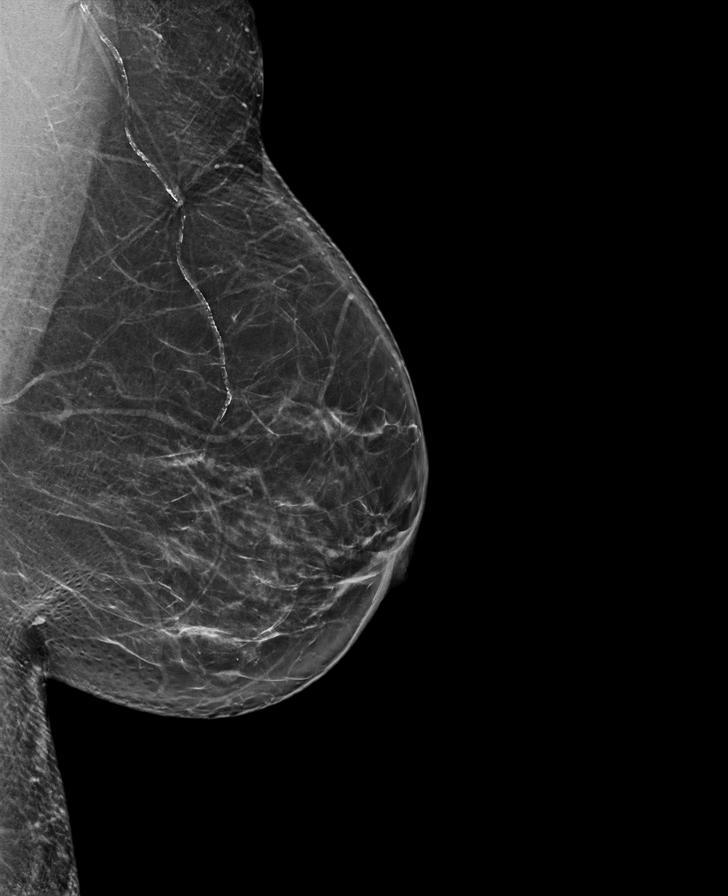

[R MLO tomo · tomo slice 41/81.0]
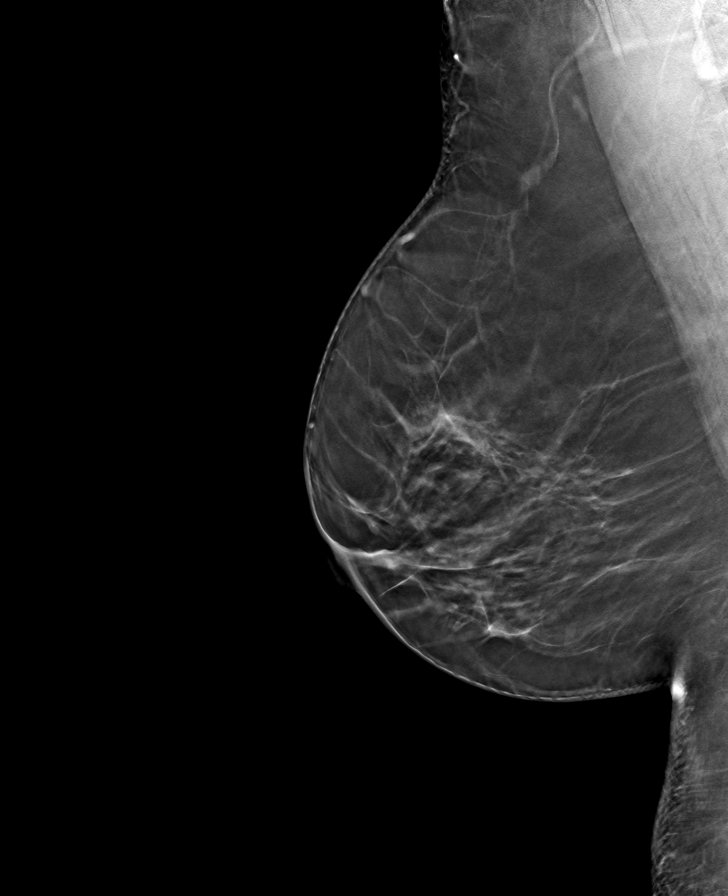

[L MLO tomo · tomo slice 43/84.0]
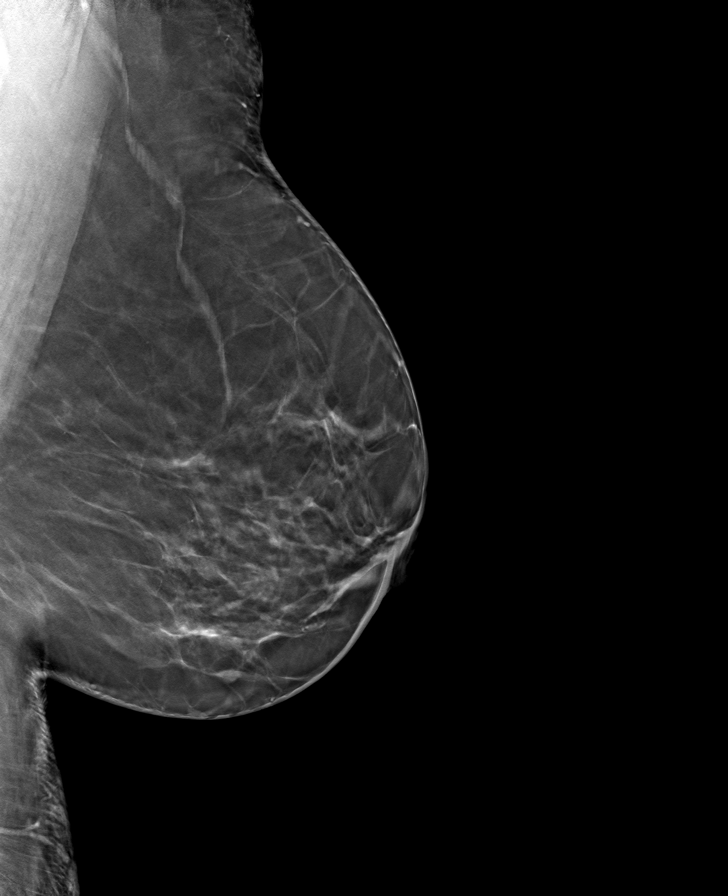

[R CC tomo · tomo slice 37/72.0]
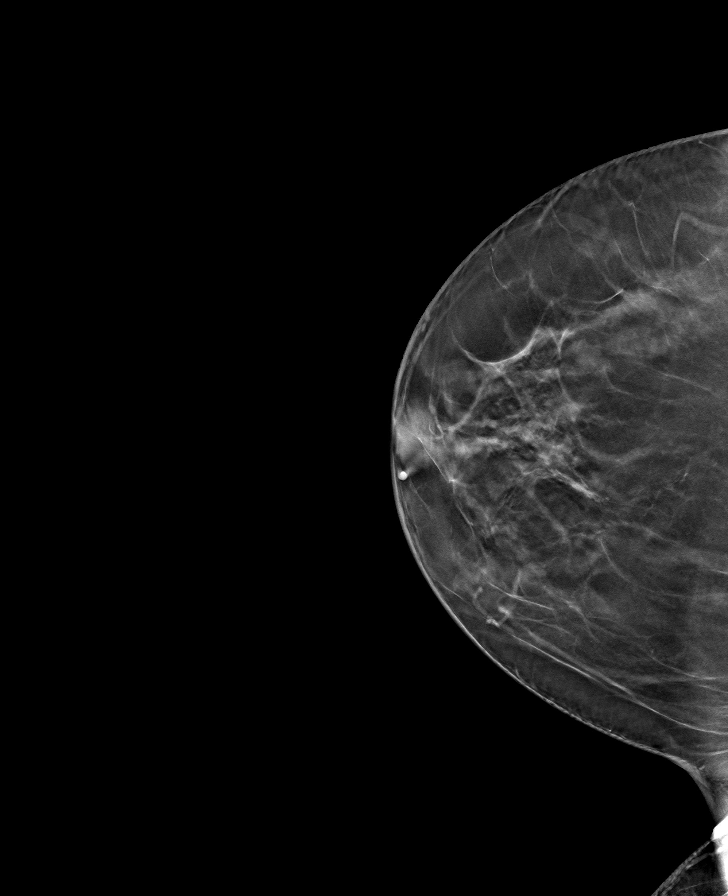

[L CC tomo · tomo slice 39/76.0]
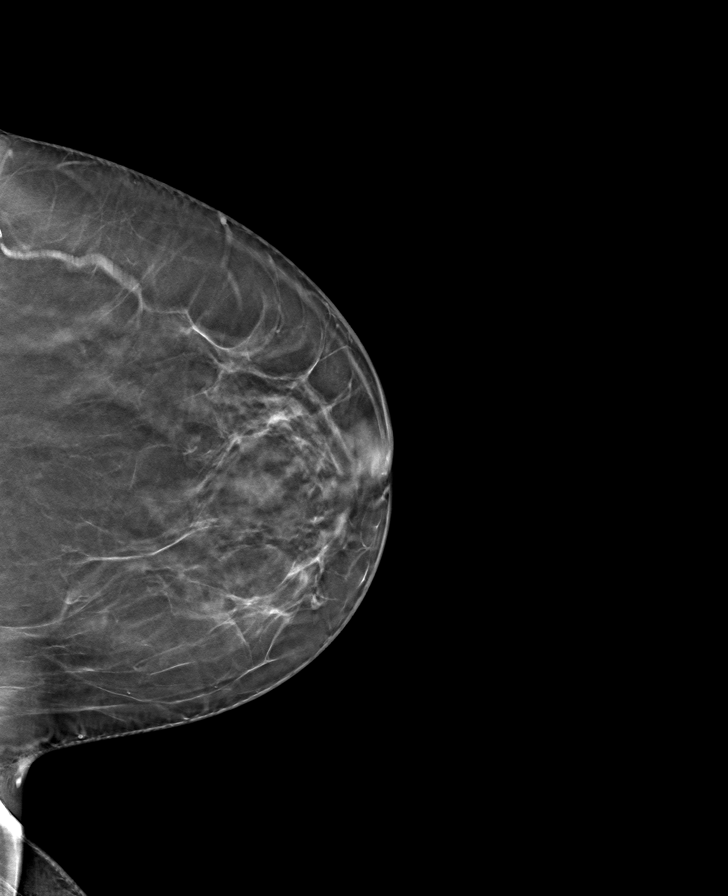

[8 of 24 positions shown; findings below may reference images not displayed]

ACR Breast Density Category b: There are scattered areas of
fibroglandular density.
FINDINGS: There are no findings suspicious for malignancy. Images were
processed with CAD.
IMPRESSION: No mammographic evidence of malignancy. A result letter of this
screening mammogram will be mailed directly to the patient.

RECOMMENDATION:
Screening mammogram in one year. (Code:CN-[id number])

BI-RADS CATEGORY  1: Negative.

## 2021-02-16 IMAGING — US US THYROID
1 series · 13 of 25 positions shown · non-contrast
Comparison: 08/19/2018, 06/30/2014

CLINICAL DATA: Nodules. Previous FNA biopsy mid left nodule
09/13/2014.

EXAM:
THYROID ULTRASOUND
TECHNIQUE: Ultrasound examination of the thyroid gland and adjacent soft
tissues was performed.

[Series 1: us thyroid · 0.05mm/px · 13 of 55 slices shown]
[im 1/55]
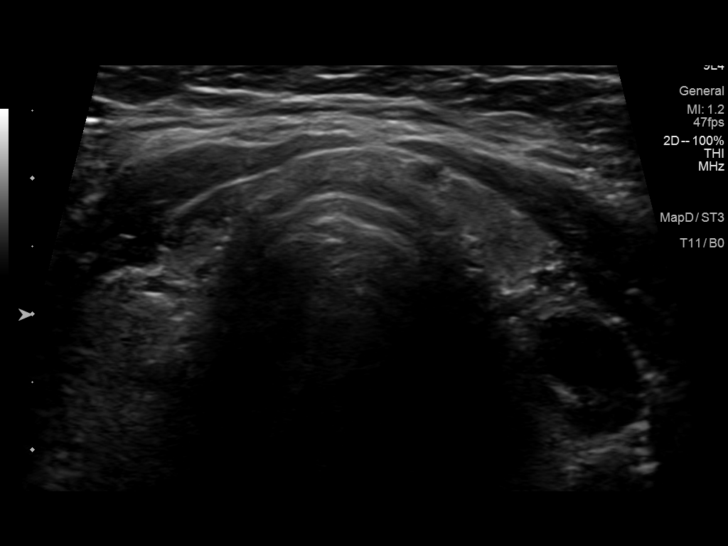
[im 5/55]
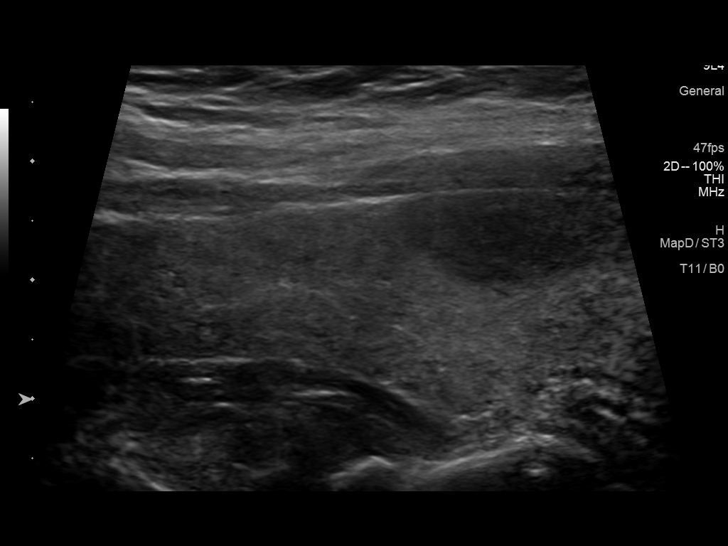
[im 10/55]
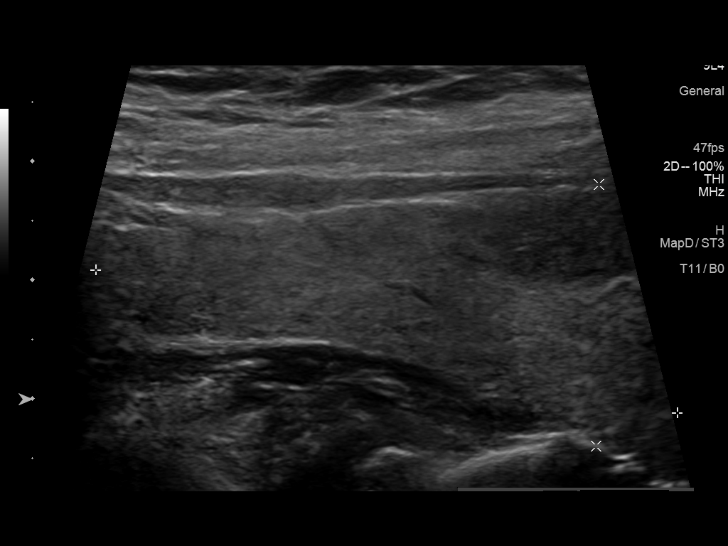
[im 14/55]
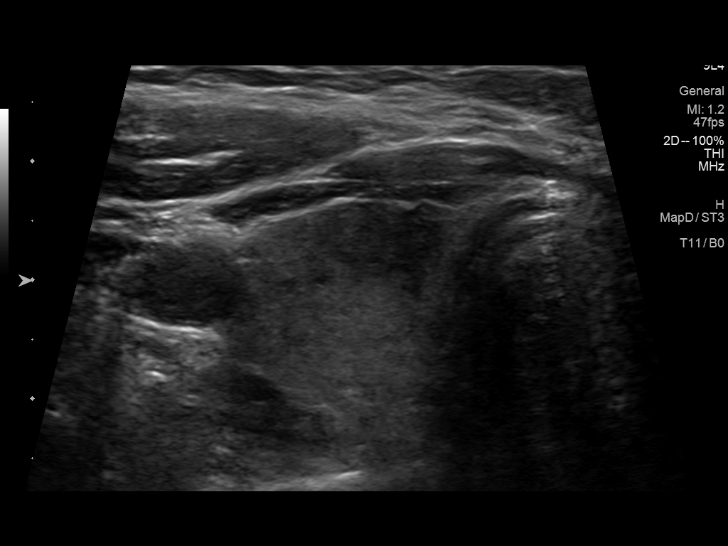
[im 19/55]
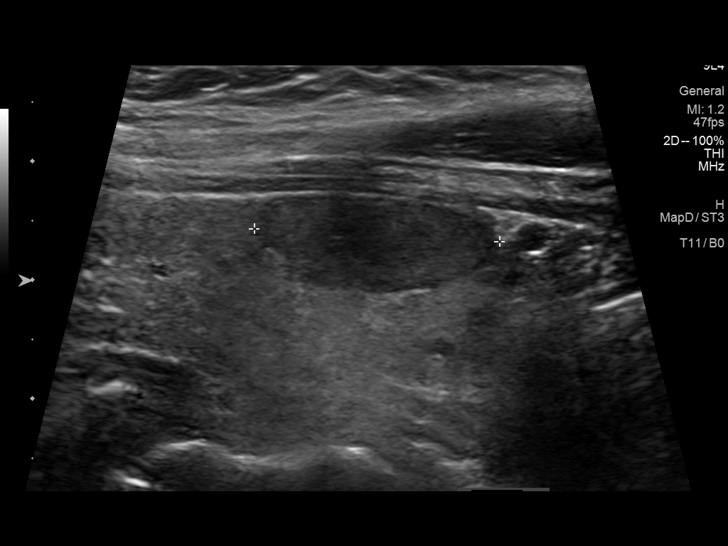
[im 23/55]
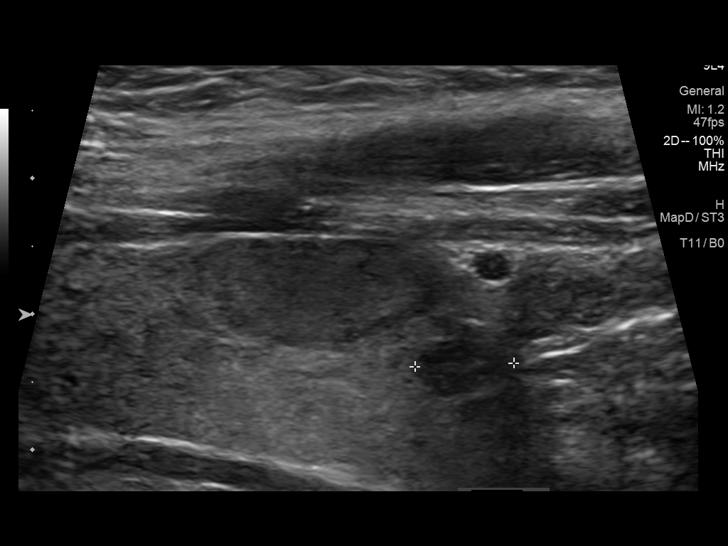
[im 28/55]
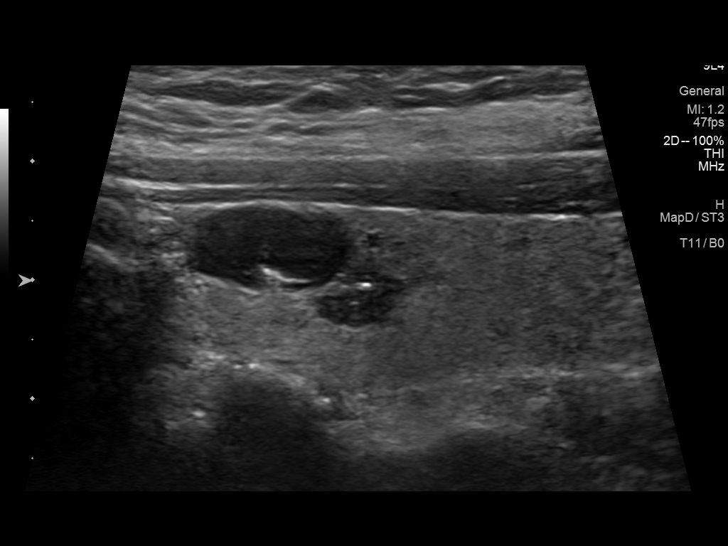
[im 32/55]
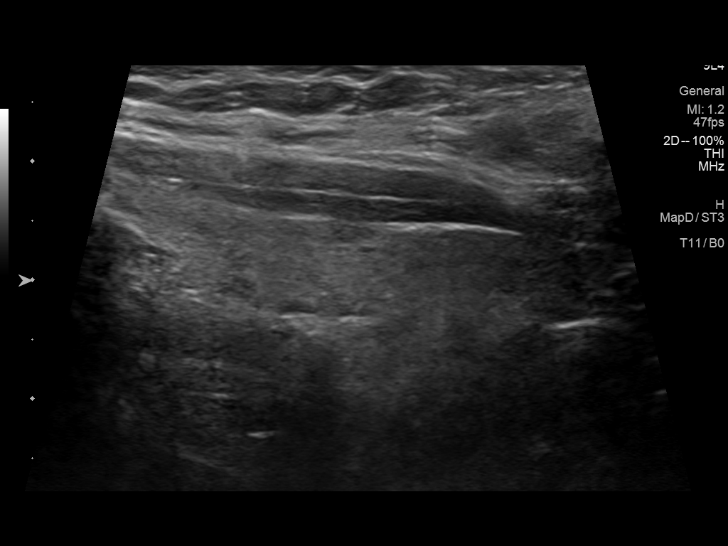
[im 37/55]
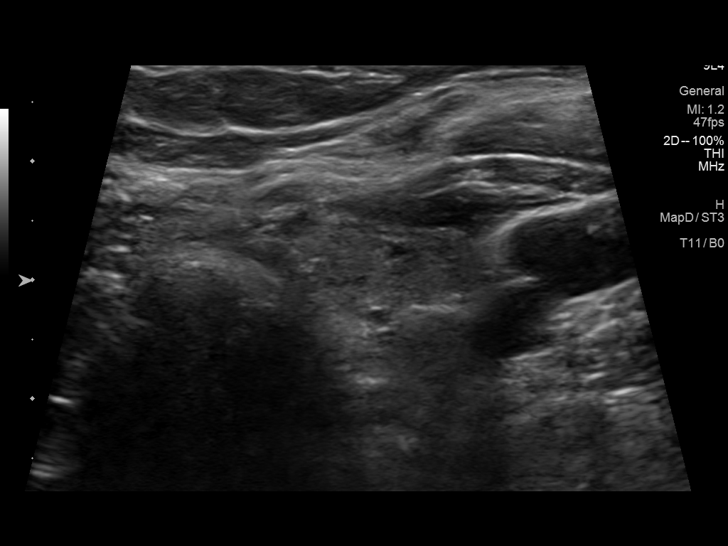
[im 41/55]
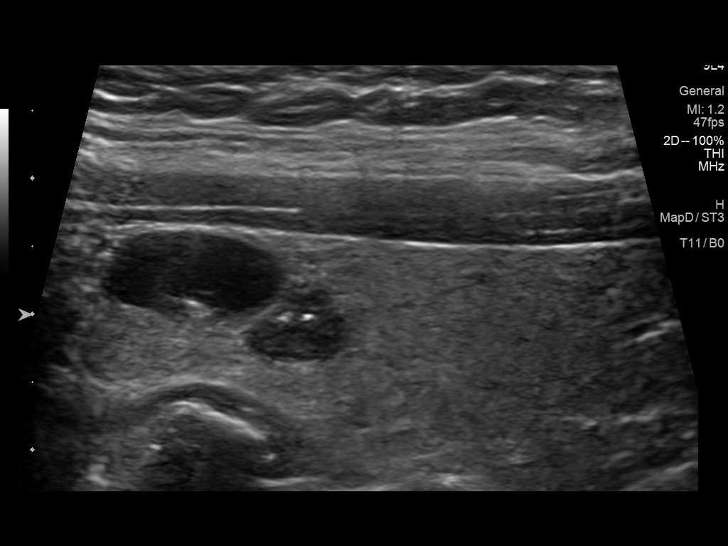
[im 46/55]
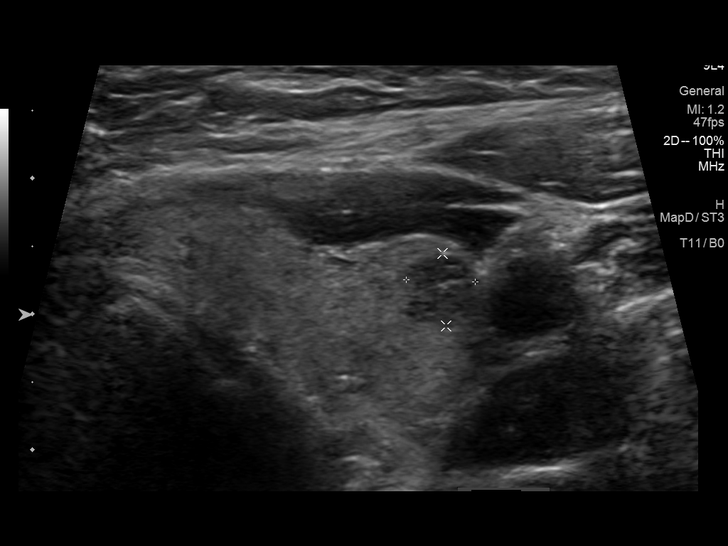
[im 50/55]
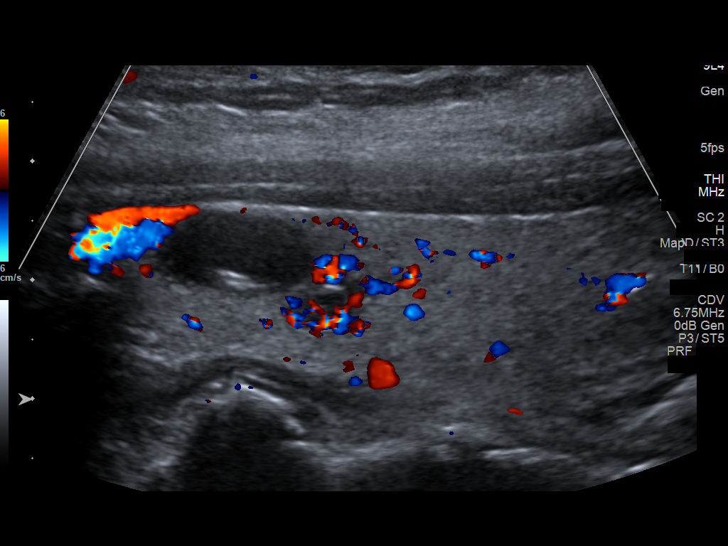
[im 55/55]
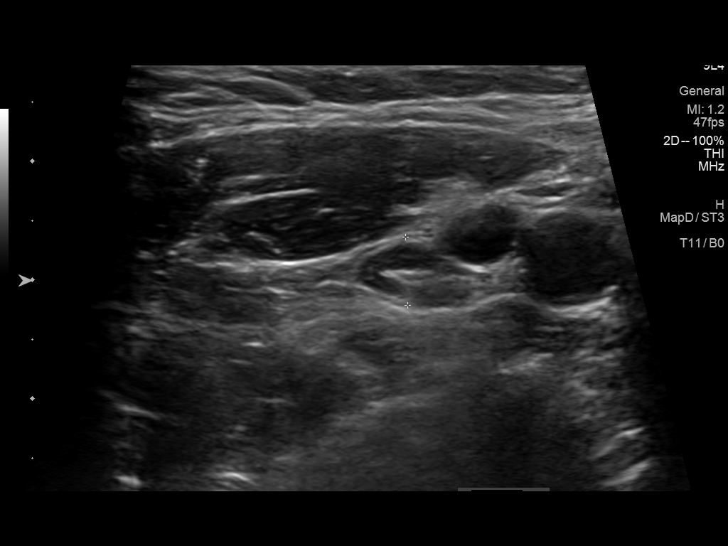

[13 of 25 positions shown; findings below may reference images not displayed]

FINDINGS: Parenchymal Echotexture: Moderately heterogenous

Isthmus: 0.3 cm thickness, stable

Right lobe: 5 x 2.2 x 1.9 cm, previously 5.5 x 1.6 x

Left lobe: 4.9 x 1.8 x 1.7 cm, previously 5.5 x 1.5 x

_________________________________________________________

Estimated total number of nodules >/= 1 cm: 2

Number of spongiform nodules >/=  2 cm not described below (TR1): 0

Number of mixed cystic and solid nodules >/= 1.5 cm not described
below (TR2): 0

_________________________________________________________

Nodule # 1:

Prior biopsy: No

Location: Right; inferior

Maximum size: 2.1 cm; Other 2 dimensions: 0.8 x 1.5 cm, previously,
2.1 x 0.8 x 1.6 on 06/30/2014

Composition: solid/almost completely solid (2)

Echogenicity: hypoechoic (2)

Shape: not taller-than-wide (0)

Margins: smooth (0)

Echogenic foci: none (0)

ACR TI-RADS total points: 4.

ACR TI-RADS risk category:  TR4 (4-6 points).

Significant change in size (>/= 20% in two dimensions and minimal
increase of 2 mm): No

Change in features: No

Change in ACR TI-RADS risk category: No

ACR TI-RADS recommendations:

Stability over greater than 5 years implies benignity. No biopsy or
follow-up recommended.

_________________________________________________________

Nodule # 2: 0.7 cm hypoechoic nodule without calcifications,
inferior right; This nodule does NOT meet TI-RADS criteria for
biopsy or dedicated follow-up.

Nodule # 3: 1.5 cm benign colloid cyst, superior left, previously
1.3; This nodule does NOT meet TI-RADS criteria for biopsy or
dedicated follow-up.

Nodule # 4: 0.8 cm hypoechoic nodule with calcifications, mid left,
previously 0.9; this was previously biopsied.

Nodule # 5: 0.6 cm hypoechoic nodule without calcifications,
inferior left; This nodule does NOT meet TI-RADS criteria for biopsy
or dedicated follow-up.
IMPRESSION: 1. Mild thyromegaly with bilateral nodules. None currently meets
criteria for biopsy or follow-up.

The above is in keeping with the ACR TI-RADS recommendations - [HOSPITAL] 9696;[DATE].

## 2021-03-06 LAB — HM DIABETES EYE EXAM

## 2021-04-07 ENCOUNTER — Other Ambulatory Visit: Payer: Self-pay | Admitting: Internal Medicine

## 2021-04-09 ENCOUNTER — Other Ambulatory Visit: Payer: Self-pay | Admitting: Internal Medicine

## 2021-04-10 MED ORDER — ATENOLOL 25 MG PO TABS: 25.0000 mg | ORAL_TABLET | Freq: Every day | ORAL | 1 refills | Status: DC

## 2021-04-10 MED ORDER — METFORMIN HCL 500 MG PO TABS: 1.0000 | ORAL_TABLET | Freq: Two times a day (BID) | ORAL | 1 refills | Status: DC

## 2021-08-01 ENCOUNTER — Other Ambulatory Visit: Payer: Self-pay | Admitting: Primary Care

## 2021-08-01 DIAGNOSIS — E785 Hyperlipidemia, unspecified: Secondary | ICD-10-CM

## 2021-08-29 ENCOUNTER — Other Ambulatory Visit: Payer: Self-pay

## 2021-08-29 ENCOUNTER — Ambulatory Visit (INDEPENDENT_AMBULATORY_CARE_PROVIDER_SITE_OTHER): Payer: Commercial Managed Care - PPO | Admitting: Primary Care

## 2021-08-29 ENCOUNTER — Encounter: Payer: Self-pay | Admitting: Primary Care

## 2021-08-29 VITALS — BP 128/76 | HR 92 | Temp 97.7°F | Ht 65.0 in | Wt 206.0 lb

## 2021-08-29 DIAGNOSIS — K219 Gastro-esophageal reflux disease without esophagitis: Secondary | ICD-10-CM

## 2021-08-29 DIAGNOSIS — E041 Nontoxic single thyroid nodule: Secondary | ICD-10-CM

## 2021-08-29 DIAGNOSIS — E119 Type 2 diabetes mellitus without complications: Secondary | ICD-10-CM | POA: Diagnosis not present

## 2021-08-29 DIAGNOSIS — Z0000 Encounter for general adult medical examination without abnormal findings: Secondary | ICD-10-CM | POA: Diagnosis not present

## 2021-08-29 DIAGNOSIS — G4700 Insomnia, unspecified: Secondary | ICD-10-CM

## 2021-08-29 DIAGNOSIS — E0590 Thyrotoxicosis, unspecified without thyrotoxic crisis or storm: Secondary | ICD-10-CM

## 2021-08-29 LAB — COMPREHENSIVE METABOLIC PANEL
ALT: 19 U/L (ref 0–35)
AST: 22 U/L (ref 0–37)
Albumin: 4.3 g/dL (ref 3.5–5.2)
Alkaline Phosphatase: 73 U/L (ref 39–117)
BUN: 11 mg/dL (ref 6–23)
CO2: 28 mEq/L (ref 19–32)
Calcium: 9.6 mg/dL (ref 8.4–10.5)
Chloride: 108 mEq/L (ref 96–112)
Creatinine, Ser: 0.71 mg/dL (ref 0.40–1.20)
GFR: 93.28 mL/min (ref >60.00)
Glucose, Bld: 160 mg/dL — ABNORMAL HIGH (ref 70–99)
Potassium: 4.7 mEq/L (ref 3.5–5.1)
Sodium: 143 mEq/L (ref 135–145)
Total Bilirubin: 0.5 mg/dL (ref 0.2–1.2)
Total Protein: 7.6 g/dL (ref 6.0–8.3)

## 2021-08-29 LAB — CBC
HCT: 40.8 % (ref 36.0–46.0)
Hemoglobin: 13.6 g/dL (ref 12.0–15.0)
MCHC: 33.3 g/dL (ref 30.0–36.0)
MCV: 90.1 fl (ref 78.0–100.0)
Platelets: 275 10*3/uL (ref 150.0–400.0)
RBC: 4.53 Mil/uL (ref 3.87–5.11)
RDW: 13.2 % (ref 11.5–15.5)
WBC: 4.2 10*3/uL (ref 4.0–10.5)

## 2021-08-29 LAB — LIPID PANEL
Cholesterol: 155 mg/dL (ref 0–200)
HDL: 61.8 mg/dL (ref >39.00)
LDL Cholesterol: 74 mg/dL (ref 0–99)
NonHDL: 93.05
Total CHOL/HDL Ratio: 3
Triglycerides: 96 mg/dL (ref 0.0–149.0)
VLDL: 19.2 mg/dL (ref 0.0–40.0)

## 2021-08-29 LAB — MICROALBUMIN / CREATININE URINE RATIO
Creatinine,U: 67 mg/dL
Microalb Creat Ratio: 1 mg/g (ref 0.0–30.0)
Microalb, Ur: <0.7 mg/dL (ref 0.0–1.9)

## 2021-08-29 LAB — HEMOGLOBIN A1C: Hgb A1c MFr Bld: 7 % — ABNORMAL HIGH (ref 4.6–6.5)

## 2021-08-29 NOTE — Progress Notes (Signed)
Subjective:    Patient ID: Rebecca Macias, female    DOB: 03/29/1961, 60 y.o.   MRN: [id number]  HPI  Rebecca Macias is a very pleasant 60 y.o. female who presents today for complete physical and follow up of chronic conditions.  Immunizations: -Tetanus: 2016 -Influenza: Due -Covid-19: 3 vaccines -Shingles: Shingrix  -Pneumonia: Pneumovax 2018   Diet: [place].  Exercise: No regular exercise, active at work.   Eye exam: Completes annually  Dental exam: Completes semi-annually   Pap Smear: Completed in 2021 Mammogram: Completed in January 2022 Colonoscopy: Completed in 2013, due 2023  BP Readings from Last 3 Encounters:  08/29/21 128/76  11/08/20 122/60  10/25/20 140/70        Review of Systems  Constitutional:  Negative for unexpected weight change.  HENT:  Negative for rhinorrhea.   Eyes:  Negative for visual disturbance.  Respiratory:  Negative for cough and shortness of breath.   Cardiovascular:  Negative for chest pain.  Gastrointestinal:  Negative for constipation and diarrhea.  Genitourinary:  Negative for difficulty urinating.  Musculoskeletal:  Negative for arthralgias and myalgias.  Skin:  Negative for rash.  Allergic/Immunologic: Negative for environmental allergies.  Neurological:  Negative for dizziness, numbness and headaches.  Psychiatric/Behavioral:  The patient is not nervous/anxious.         Past Medical History:  Diagnosis Date   H/O gestational diabetes mellitus, not currently pregnant    Ovarian cyst    Palpitations    Sigmoid diverticulitis 08/2012   Subclinical hypothyroidism    Type 2 diabetes mellitus ([place])     Social History   Socioeconomic History   Marital status: Divorced    Spouse name: Not on file   Number of children: Not on file   Years of education: Not on file   Highest education level: Not on file  Occupational History   Not on file  Tobacco Use   Smoking status: Former    Types: Cigarettes, Cigars     Quit date: 09/19/2011    Years since quitting: 9.9   Smokeless tobacco: Never  Vaping Use   Vaping Use: Former  Substance and Sexual Activity   Alcohol use: No   Drug use: No   Sexual activity: Not on file  Other Topics Concern   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   Social Determinants of Health   Financial Resource Strain: Not on file  Food Insecurity: Not on file  Transportation Needs: Not on file  Physical Activity: Not on file  Stress: Not on file  Social Connections: Not on file  Intimate Partner Violence: Not on file    Past Surgical History:  Procedure Laterality Date   BLADDER SUSPENSION  2009   SMALL INTESTINE SURGERY     TUBAL LIGATION      Family History  Problem Relation Age of Onset   Heart disease Father    Kidney disease Father    Diabetes Father    Diabetes Maternal Grandmother    Diabetes Paternal Grandmother    Breast cancer Neg Hx     Allergies  Allergen Reactions   Flagyl [Metronidazole] Itching    Current Outpatient Medications on File Prior to Visit  Medication Sig Dispense Refill   atenolol (TENORMIN) 25 MG tablet Take 1 tablet (25 mg total) by mouth daily. 90 tablet 1   Biotin 1000 MCG tablet  Take 1,000 mcg by mouth daily.     cyanocobalamin 500 MCG tablet Take 1,000 mcg by mouth daily. Reported on 06/12/2016     docusate sodium (COLACE) 100 MG capsule Take 200 mg by mouth daily. Reported on 06/12/2016     ibuprofen (ADVIL,MOTRIN) 200 MG tablet Take 600 mg by mouth every 6 (six) hours as needed. Reported on 06/12/2016     metFORMIN (GLUCOPHAGE) 500 MG tablet Take 1 tablet (500 mg total) by mouth 2 (two) times daily with a meal. 180 tablet 1   rosuvastatin (CRESTOR) 5 MG tablet TAKE 1 TABLET (5 MG TOTAL) BY MOUTH EVERY EVENING. FOR CHOLESTEROL. 30 tablet 0   No current facility-administered medications on file prior to visit.    BP 128/76   Pulse  92   Temp 97.7 F (36.5 C) (Temporal)   Ht 5\' 5"  (1.651 m)   Wt 206 lb (93.4 kg)   LMP 11/03/2012   SpO2 97%   BMI 34.28 kg/m  Objective:   Physical Exam HENT:     Right Ear: Tympanic membrane and ear canal normal.     Left Ear: Tympanic membrane and ear canal normal.     Nose: Nose normal.  Eyes:     Conjunctiva/sclera: Conjunctivae normal.     Pupils: Pupils are equal, round, and reactive to light.  Neck:     Thyroid: No thyromegaly.  Cardiovascular:     Rate and Rhythm: Normal rate and regular rhythm.     Heart sounds: No murmur heard. Pulmonary:     Effort: Pulmonary effort is normal.     Breath sounds: Normal breath sounds. No rales.  Abdominal:     General: Bowel sounds are normal.     Palpations: Abdomen is soft.     Tenderness: There is no abdominal tenderness.  Musculoskeletal:        General: Normal range of motion.     Cervical back: Neck supple.  Lymphadenopathy:     Cervical: No cervical adenopathy.  Skin:    General: Skin is warm and dry.     Findings: No rash.  Neurological:     Mental Status: She is alert and oriented to person, place, and time.     Cranial Nerves: No cranial nerve deficit.     Deep Tendon Reflexes: Reflexes are normal and symmetric.  Psychiatric:        Mood and Affect: Mood normal.          Assessment & Plan:      This visit occurred during the SARS-CoV-2 public health emergency.  Safety protocols were in place, including screening questions prior to the visit, additional usage of staff PPE, and extensive cleaning of exam room while observing appropriate contact time as indicated for disinfecting solutions.

## 2021-08-29 NOTE — Assessment & Plan Note (Signed)
Doing well overall, no concerns today.  Denies concerns for anxiety and depression

## 2021-08-29 NOTE — Patient Instructions (Addendum)
Stop by the lab prior to leaving today. I will notify you of your results once received.   Please schedule a follow up visit for 6 months for diabetes check.  It was a pleasure to see you today!  Preventive Care 22-59 Years Old, Female Preventive care refers to lifestyle choices and visits with your health care provider that can promote health and wellness. This includes: A yearly physical exam. This is also called an annual wellness visit. Regular dental and eye exams. Immunizations. Screening for certain conditions. Healthy lifestyle choices, such as: Eating a healthy diet. Getting regular exercise. Not using drugs or products that contain nicotine and tobacco. Limiting alcohol use. What can I expect for my preventive care visit? Physical exam Your health care provider will check your: Height and weight. These may be used to calculate your BMI (body mass index). BMI is a measurement that tells if you are at a healthy weight. Heart rate and blood pressure. Body temperature. Skin for abnormal spots. Counseling Your health care provider may ask you questions about your: Past medical problems. Family's medical history. Alcohol, tobacco, and drug use. Emotional well-being. Home life and relationship well-being. Sexual activity. Diet, exercise, and sleep habits. Work and work Statistician. Access to firearms. Method of birth control. Menstrual cycle. Pregnancy history. What immunizations do I need? Vaccines are usually given at various ages, according to a schedule. Your health care provider will recommend vaccines for you based on your age, medical history, and lifestyle or other factors, such as travel or where you work. What tests do I need? Blood tests Lipid and cholesterol levels. These may be checked every 5 years, or more often if you are over 54 years old. Hepatitis C test. Hepatitis B test. Screening Lung cancer screening. You may have this screening every year  starting at age 67 if you have a 30-pack-year history of smoking and currently smoke or have quit within the past 15 years. Colorectal cancer screening. All adults should have this screening starting at age 60 and continuing until age 21. Your health care provider may recommend screening at age 90 if you are at increased risk. You will have tests every 1-10 years, depending on your results and the type of screening test. Diabetes screening. This is done by checking your blood sugar (glucose) after you have not eaten for a while (fasting). You may have this done every 1-3 years. Mammogram. This may be done every 1-2 years. Talk with your health care provider about when you should start having regular mammograms. This may depend on whether you have a family history of breast cancer. BRCA-related cancer screening. This may be done if you have a family history of breast, ovarian, tubal, or peritoneal cancers. Pelvic exam and Pap test. This may be done every 3 years starting at age 63. Starting at age 69, this may be done every 5 years if you have a Pap test in combination with an HPV test. Other tests STD (sexually transmitted disease) testing, if you are at risk. Bone density scan. This is done to screen for osteoporosis. You may have this scan if you are at high risk for osteoporosis. Talk with your health care provider about your test results, treatment options, and if necessary, the need for more tests. Follow these instructions at home: Eating and drinking  Eat a diet that includes fresh fruits and vegetables, whole grains, lean protein, and low-fat dairy products. Take vitamin and mineral supplements as recommended by your health care provider.  Do not drink alcohol if: Your health care provider tells you not to drink. You are pregnant, may be pregnant, or are planning to become pregnant. If you drink alcohol: Limit how much you have to 0-1 drink a day. Be aware of how much alcohol is  in your drink. In the [place] one drink equals one 12 oz bottle of beer (355 mL), one 5 oz glass of wine (148 mL), or one 1 oz glass of hard liquor (44 mL). Lifestyle Take daily care of your teeth and gums. Brush your teeth every morning and night with fluoride toothpaste. Floss one time each day. Stay active. Exercise for at least 30 minutes 5 or more days each week. Do not use any products that contain nicotine or tobacco, such as cigarettes, e-cigarettes, and chewing tobacco. If you need help quitting, ask your health care provider. Do not use drugs. If you are sexually active, practice safe sex. Use a condom or other form of protection to prevent STIs (sexually transmitted infections). If you do not wish to become pregnant, use a form of birth control. If you plan to become pregnant, see your health care provider for a prepregnancy visit. If told by your health care provider, take low-dose aspirin daily starting at age 32. Find healthy ways to cope with stress, such as: Meditation, yoga, or listening to music. Journaling. Talking to a trusted person. Spending time with friends and family. Safety Always wear your seat belt while driving or riding in a vehicle. Do not drive: If you have been drinking alcohol. Do not ride with someone who has been drinking. When you are tired or distracted. While texting. Wear a helmet and other protective equipment during sports activities. If you have firearms in your house, make sure you follow all gun safety procedures. What's next? Visit your health care provider once a year for an annual wellness visit. Ask your health care provider how often you should have your eyes and teeth checked. Stay up to date on all vaccines. This information is not intended to replace advice given to you by your health care provider. Make sure you discuss any questions you have with your health care provider. Document Revised: 02/01/2021 Document Reviewed:  08/05/2018 Elsevier Patient Education  2022 [employer].

## 2021-08-29 NOTE — Assessment & Plan Note (Signed)
Immunizations UTD. Will get flut shot at work. Pap smear UTD. Colonoscopy due in 2023 Mammogram UTD.  Discussed the importance of a healthy diet and regular exercise in order for weight loss, and to reduce the risk of further co-morbidity.  Exam today stable. Labs pending.

## 2021-08-29 NOTE — Assessment & Plan Note (Signed)
Denies concerns, infrequent use of famotidine.

## 2021-08-29 NOTE — Assessment & Plan Note (Signed)
Compliant to metformin 500 mg BID. Repeat A1C pending.  Foot exam today. Urine micro due and pending. Eye exam UTD per patient.  Discussed that she needs to be seen semi-annually for diabetes treatment, at minimum. Last visit was 12 months ago.  Follow up in 3-6 months depending on A1C result.

## 2021-08-29 NOTE — Assessment & Plan Note (Signed)
Following with endocrinology, underwent ultrasound and biopsy last year, no changes.

## 2021-08-29 NOTE — Assessment & Plan Note (Addendum)
Not currently managed on treatment, following with endocrinology.   Continue atenolol 25 mg daily.

## 2021-08-30 ENCOUNTER — Other Ambulatory Visit: Payer: Self-pay | Admitting: Primary Care

## 2021-08-30 DIAGNOSIS — E785 Hyperlipidemia, unspecified: Secondary | ICD-10-CM

## 2021-09-26 ENCOUNTER — Ambulatory Visit: Payer: Commercial Managed Care - PPO | Admitting: Internal Medicine

## 2021-09-26 ENCOUNTER — Other Ambulatory Visit: Payer: Self-pay

## 2021-09-26 ENCOUNTER — Encounter: Payer: Self-pay | Admitting: Internal Medicine

## 2021-09-26 VITALS — BP 132/72 | HR 97 | Ht 65.0 in | Wt 208.2 lb

## 2021-09-26 DIAGNOSIS — E041 Nontoxic single thyroid nodule: Secondary | ICD-10-CM | POA: Diagnosis not present

## 2021-09-26 DIAGNOSIS — E0590 Thyrotoxicosis, unspecified without thyrotoxic crisis or storm: Secondary | ICD-10-CM

## 2021-09-26 DIAGNOSIS — E0510 Thyrotoxicosis with toxic single thyroid nodule without thyrotoxic crisis or storm: Secondary | ICD-10-CM | POA: Diagnosis not present

## 2021-09-26 LAB — TSH: TSH: 0.88 u[IU]/mL (ref 0.35–5.50)

## 2021-09-26 LAB — T4, FREE: Free T4: 0.84 ng/dL (ref 0.60–1.60)

## 2021-09-26 LAB — T3, FREE: T3, Free: 3.8 pg/mL (ref 2.3–4.2)

## 2021-09-26 NOTE — Progress Notes (Signed)
Patient ID: Rebecca Macias, female   DOB: 1961-10-11, 60 y.o.   MRN: [id number]  This visit occurred during the SARS-CoV-2 public health emergency.  Safety protocols were in place, including screening questions prior to the visit, additional usage of staff PPE, and extensive cleaning of exam room while observing appropriate contact time as indicated for disinfecting solutions.   HPI  Rebecca Macias is a 60 y.o.-year-old female, returning for f/u for subclinical hyperthyroidism, R toxic adenoma, and also L thyroid cold nodule. Last visit 1 year ago.  Interim history: She is feeling well, w/o complaints No tachycardia, unintentional wt loss.  Reviewed and addended history: Pt had an episode of palpitations in 12/2013 >> EKG normal >> TSH low (2 repeat set of thyroid labs confirmed a low TSH in 2015).  Subsequent TFTs have been normal.  Reviewed her TFTs: Lab Results  Component Value Date   TSH 1.12 09/03/2020   TSH 0.69 09/06/2019   TSH 0.70 07/28/2018   TSH 0.88 07/01/2017   TSH 0.60 06/27/2016   TSH 0.62 06/12/2016   TSH 0.44 12/17/2015   TSH 0.50 06/14/2015   TSH 0.80 02/12/2015   TSH 0.42 08/02/2014   FREET4 1.03 09/06/2019   FREET4 1.10 07/28/2018   FREET4 0.80 06/12/2016   FREET4 0.73 12/17/2015   FREET4 0.87 06/14/2015   FREET4 0.86 02/12/2015   FREET4 0.81 08/02/2014   FREET4 0.78 05/05/2014   FREET4 0.75 04/20/2014   FREET4 0.84 03/23/2014    Thyroid uptake an Scan: 2 defects: one R (hot) and one L (cold).  A thyroid U/S (06/30/2014) showed several small cystic spaces and 2 nodules of similar sizes. The one corresponding to the cold defect was: Ovoid nodule in the mid left lobe measures approximately 1.5 x 1.1 x 1.9 cm.  FNA (09/14/2014) of L nodule: Benign   Thyroid U/S (08/19/2018):  Parenchymal Echotexture: Mildly heterogenous Isthmus: 0.3 cm, previously 0.4 cm Right lobe: 5.5 x 1.6 x 1.9 cm, previously 6.3 x 1.9 x 2.2 cm Left lobe: 5.5 x 1.5 x 1.7 cm,  previously 5.8 x 2.1 x 2.1 cm ____________________________________________________  Nodule # 1: Prior biopsy: No Location: Right; Mid Maximum size: 1.9 cm; Other 2 dimensions: 1.2 x 0.8 cm, previously, 2.1 x 1.6 x 0.8 cm Composition: solid/almost completely solid (2) Echogenicity: hypoechoic (2)  **Given size (>/= 1.5 cm) and appearance, fine needle aspiration of this moderately suspicious nodule should be considered based on TI-RADS criteria. _________________________________________________  Hypoechoic left mid nodule 3 measures 0.9 cm and previously measured up to 1.9 cm. Biopsy was performed previously.  Left upper pole nodule 2 is predominately cystic and does not meet criteria for biopsy nor follow-up. It measures 1.3 cm and previously measured up to 0.9 cm.  IMPRESSION: Right nodule 1 is stable and continues to meet criteria for fine needle aspiration biopsy. Left nodule 2 is cystic and does not meet criteria for biopsy nor follow-up Left nodule 3 is smaller and previously underwent biopsy.  The right thyroid nodule corresponding to the hot nodule on the thyroid scan, so low ThyCA risk, therefore, we decided to repeat the ultrasound and then proceed with a biopsy if this changed ultrasound characteristics.    At last visit, I ordered a new thyroid ultrasound but she did not have this yet.   Thyroid U/S (12/26/2020): Parenchymal Echotexture: Moderately heterogenous Isthmus: 0.3 cm thickness, stable Right lobe: 5 x 2.2 x 1.9 cm, previously 5.5 x 1.6 x 1.9 Left lobe: 4.9 x 1.8  x 1.7 cm, previously 5.5 x 1.5 x 1.7 _________________________________________________________   Nodule # 1: Prior biopsy: No Location: Right; inferior Maximum size: 2.1 cm; Other 2 dimensions: 0.8 x 1.5 cm, previously, 2.1 x 0.8 x 1.6 on 06/30/2014 Composition: solid/almost completely solid (2) Echogenicity: hypoechoic (2)    Stability over greater than 5 years implies benignity. No biopsy  or follow-up recommended.  _______________________________________________________   Nodule # 2: 0.7 cm hypoechoic nodule without calcifications, inferior right; This nodule does NOT meet TI-RADS criteria for biopsy or dedicated follow-up.   Nodule # 3: 1.5 cm benign colloid cyst, superior left, previously 1.3; This nodule does NOT meet TI-RADS criteria for biopsy or dedicated follow-up.   Nodule # 4: 0.8 cm hypoechoic nodule with calcifications, mid left, previously 0.9; this was previously biopsied.   Nodule # 5: 0.6 cm hypoechoic nodule without calcifications, inferior left; This nodule does NOT meet TI-RADS criteria for biopsy or dedicated follow-up.   IMPRESSION: 1. Mild thyromegaly with bilateral nodules. None currently meets criteria for biopsy or follow-up.       Pt denies: - feeling nodules in neck - hoarseness - dysphagia - choking - SOB with lying down  She also has type 2 diabetes, managed by PCP.  Reviewed HbA1c levels: Lab Results  Component Value Date   HGBA1C 7.0 (H) 08/29/2021   HGBA1C 7.7 (H) 09/03/2020   HGBA1C 6.4 09/06/2019   HGBA1C 7.6 (A) 07/30/2018   HGBA1C 7.1 (H) 07/01/2017   HGBA1C 6.9 (H) 10/16/2016   HGBA1C 7.9 (H) 07/02/2016   She continues on Metformin 2x a day. She tells me her sister is on Ozempic and did well from the diabetes and overweight point of view after she started this.    She works at [employer].  ROS: + see HPI  I reviewed pt's medications, allergies, PMH, social hx, family hx, and changes were documented in the history of present illness. Otherwise, unchanged from my initial visit note.  Past Medical History:  Diagnosis Date   H/O gestational diabetes mellitus, not currently pregnant    Ovarian cyst    Palpitations    Sigmoid diverticulitis 08/2012   Subclinical hypothyroidism    Type 2 diabetes mellitus ([place])    Past Surgical History:  Procedure Laterality Date   BLADDER SUSPENSION  2009   SMALL INTESTINE  SURGERY     TUBAL LIGATION     Social History   Socioeconomic History   Marital status: Divorced    Spouse name: Not on file   Number of children: Not on file   Years of education: Not on file   Highest education level: Not on file  Occupational History   Not on file  Tobacco Use   Smoking status: Former    Types: Cigarettes, Cigars    Quit date: 09/19/2011    Years since quitting: 10.0   Smokeless tobacco: Never  Vaping Use   Vaping Use: Former  Substance and Sexual Activity   Alcohol use: No   Drug use: No   Sexual activity: Not on file  Other Topics Concern   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   Social Determinants of Health   Financial Resource Strain: Not on file  Food Insecurity: Not on file  Transportation Needs: Not on file  Physical Activity: Not on file  Stress: Not on file  Social Connections: Not on file  Intimate Partner Violence: Not on file   Current Outpatient Medications on File Prior to Visit  Medication Sig Dispense Refill   atenolol (TENORMIN) 25 MG tablet Take 1 tablet (25 mg total) by mouth daily. 90 tablet 1   Biotin 1000 MCG tablet Take 1,000 mcg by mouth daily.     cyanocobalamin 500 MCG tablet Take 1,000 mcg by mouth daily. Reported on 06/12/2016     docusate sodium (COLACE) 100 MG capsule Take 200 mg by mouth daily. Reported on 06/12/2016     ibuprofen (ADVIL,MOTRIN) 200 MG tablet Take 600 mg by mouth every 6 (six) hours as needed. Reported on 06/12/2016     metFORMIN (GLUCOPHAGE) 500 MG tablet Take 1 tablet (500 mg total) by mouth 2 (two) times daily with a meal. 180 tablet 1   rosuvastatin (CRESTOR) 5 MG tablet TAKE 1 TABLET (5 MG TOTAL) BY MOUTH EVERY EVENING. FOR CHOLESTEROL. 30 tablet 0   No current facility-administered medications on file prior to visit.   Allergies  Allergen Reactions   Flagyl [Metronidazole] Itching   Family History   Problem Relation Age of Onset   Heart disease Father    Kidney disease Father    Diabetes Father    Diabetes Maternal Grandmother    Diabetes Paternal Grandmother    Breast cancer Neg Hx     PE: LMP 11/03/2012  Wt Readings from Last 3 Encounters:  09/26/21 208 lb 3.2 oz (94.4 kg)  08/29/21 206 lb (93.4 kg)  11/08/20 217 lb (98.4 kg)   Constitutional: overweight, in NAD Eyes: PERRLA, EOMI, no exophthalmos ENT: moist mucous membranes, no thyromegaly, no cervical lymphadenopathy Cardiovascular: RRR, No MRG Respiratory: CTA B Gastrointestinal: abdomen soft, NT, ND, BS+ Musculoskeletal: no deformities, strength intact in all 4 Skin: moist, warm, no rashes Neurological: no tremor with outstretched hands, DTR normal in all 4  ASSESSMENT: 1. 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 Relative increased uptake within a right lower pole nodule in thesetting of depressed TSH is concerning for an autonomous nodule. 2. Photopenia in the lower pole of the left lobe suggests a cold nodule. 3. Consider thyroid ultrasound prior to consideration of I 131 therapy for hyperthyroidism. 4. 24 hour I 131 uptake = 18% (normal 10-30%) - 06/30/2014: Thyroid U/S: Right thyroid lobe Measurements: 6.3 x 1.9 x 2.2 cm. Well-circumscribed, solid mid right thyroid nodule measures approximately 1.6 x 0.8 x 2.1 cm. This is a noncalcified nodule. Tiny cystic areas are present in the inferior right lobe measuring 0.3 and 0.5 cm.  Left thyroid lobe Measurements: 5.8 x 2.1 x 2.1 cm. Ovoid nodule in the mid left lobe measures approximately 1.5 x 1.1 x 1.9 cm. This likely corresponds to the nuclear medicine abnormality. Mildly complex cyst in the superior left lobe measures 0.9 cm. Small cysts in the inferior left lobe measures 0.3 cm.  Isthmus Thickness: 0.4 cm. No nodules visualized.  Lymphadenopathy None visualized.  IMPRESSION:  Bilateral dominant thyroid nodules. Right thyroid nodule measures  approximately 1.6 x 0.8 x 2.1 cm and is located in the midportion of  the right lobe. This nodule could be watched or sampled based on size criteria. Nodule in the mid left lobe measures 1.5 x 1.1 x 1.9  cm and likely corresponds to the cold nuclear medicine defect. There would be an indication to biopsy this nodule based on size criteria and nuclear medicine findings.   + see HPI   2. Cold L thyroid nodule - FNA benign (  09/14/2014):  Adequacy Reason Satisfactory For Evaluation. Diagnosis THYROID, FINE NEEDLE ASPIRATION LMP (SPECIMEN 1 OF 1, COLLECTED ON 06/13/14): BENIGN. FINDINGS CONSISTENT WITH A BENIGN FOLLICULAR NODULE. Enid Cutter MD Pathologist, Electronic Signature (Case signed 09/14/2014)  3. Rheems Hyperthyroidism  PLAN:  1. And 2.  Right toxic adenoma and: Left thyroid nodule -Patient has a history of subclinical hyperthyroidism with history of thyrotoxic symptoms including heat intolerance and palpitations.  She was found to have a right toxic adenoma, however, her TFTs normalized spontaneously and we decided to just follow this conservatively.  She also had a left-sided cold thyroid nodule that was biopsied in 2015 with benign results.  In 2019, we checked a thyroid ultrasound and this showed stable nodules. -We reviewed together latest thyroid ultrasound report from 12/26/2020.  This showed stability of her nodules in 5 years so no follow-up is necessary -No neck compression symptoms -She continues to have some palpitations but they are much improved-on atenolol 25 mg daily -She had TFTs checked in 08/2020 and these were normal -At this point, I would advise him to return in 2 years  3. Subclinical hyperthyroidism -Resolved -She does not complain of any thyrotoxic symptoms: No weight loss, heat intolerance, tremors, increased palpitations. -Latest thyroid labs were normal in 08/2020 -lost 7 lbs net intentionally (initially lost 40 lbs on a keto diet >> stopped 2/2 high  LDL) -We will recheck these today  Component     Latest Ref Rng & Units 09/26/2021  TSH     0.35 - 5.50 uIU/mL 0.88  Triiodothyronine,Free,Serum     2.3 - 4.2 pg/mL 3.8  T4,Free(Direct)     0.60 - 1.60 ng/dL 0.84  Normal TFTs.  Philemon Kingdom, MD PhD [facility] Endocrinology

## 2021-09-26 NOTE — Patient Instructions (Addendum)
Please continue: - Atenolol 25 mg daily  Please stop at the lab  Please come back for a follow-up appointment as needed.

## 2021-09-27 ENCOUNTER — Other Ambulatory Visit: Payer: Self-pay | Admitting: Family Medicine

## 2021-09-27 DIAGNOSIS — E785 Hyperlipidemia, unspecified: Secondary | ICD-10-CM

## 2021-10-10 ENCOUNTER — Ambulatory Visit: Payer: Commercial Managed Care - PPO | Admitting: Primary Care

## 2021-10-10 ENCOUNTER — Other Ambulatory Visit: Payer: Self-pay

## 2021-10-10 ENCOUNTER — Encounter: Payer: Self-pay | Admitting: Primary Care

## 2021-10-10 VITALS — BP 132/70 | HR 83 | Temp 97.3°F | Ht 65.0 in | Wt 213.0 lb

## 2021-10-10 DIAGNOSIS — M545 Low back pain, unspecified: Secondary | ICD-10-CM | POA: Insufficient documentation

## 2021-10-10 DIAGNOSIS — M5442 Lumbago with sciatica, left side: Secondary | ICD-10-CM

## 2021-10-10 DIAGNOSIS — G8929 Other chronic pain: Secondary | ICD-10-CM | POA: Insufficient documentation

## 2021-10-10 MED ORDER — CYCLOBENZAPRINE HCL 5 MG PO TABS
5.0000 mg | ORAL_TABLET | Freq: Every evening | ORAL | 0 refills | Status: DC | PRN
Start: 2021-10-10 — End: 2022-08-28

## 2021-10-10 MED ORDER — METHYLPREDNISOLONE ACETATE 80 MG/ML IJ SUSP
80.0000 mg | Freq: Once | INTRAMUSCULAR | Status: AC
  Administered 2021-10-10: 80 mg via INTRAMUSCULAR

## 2021-10-10 MED ORDER — PREDNISONE 20 MG PO TABS: ORAL_TABLET | ORAL | 0 refills | Status: DC

## 2021-10-10 NOTE — Assessment & Plan Note (Signed)
With sciatica to left lower extremity.   No alarm signs in HPI or on exam.   Treat with IM Depo Medrol 80 mg today. Rx for prednisone course, 40 mg daily x 4 days, the 20 mg daily x 4 days. Rx for Flexeril 5 mg sent to pharmacy, drowsiness precautions provided.   She will update.

## 2021-10-10 NOTE — Progress Notes (Signed)
Subjective:    Patient ID: [name], female    DOB: 01-Sep-1961, 60 y.o.   MRN: [id number]  HPI  VUNG KUSH is a very pleasant 60 y.o. female with a history of subclinical hyperthyroidism, type 2 diabetes, palpitations who presents today to discuss lower extremity pain.   Her symptoms began three weeks ago with left lower back pain with eventual radiation to her hip, then to the lower extremity through the knee. She then developed numbness to the left lower extremity through the toes, mostly to the left great toe. She feels a burning sensation to her chin of the left lower extremity.   Symptoms improve when walking, more numb when sitting. She denies weakness to left lower extremity, rashes, trauma/injury, right sided symptoms, changes in bowel/bladder control.   She noticed a blue discoloration to the left great toenail. She's tried applying compression socks, topical OTC agents, Ibuprofen 800 mg every 4-6 hours or Aleve twice daily with some improvement to the back. She's also tried walking, stretching, massage without much improvement.    Review of Systems  Musculoskeletal:  Positive for arthralgias and back pain.  Skin:        Blue spots to left great toenail  Neurological:  Positive for numbness. Negative for weakness.        Past Medical History:  Diagnosis Date   H/O gestational diabetes mellitus, not currently pregnant    Ovarian cyst    Palpitations    Sigmoid diverticulitis 08/2012   Subclinical hypothyroidism    Type 2 diabetes mellitus ([place])     Social History   Socioeconomic History   Marital status: Divorced    Spouse name: Not on file   Number of children: Not on file   Years of education: Not on file   Highest education level: Not on file  Occupational History   Not on file  Tobacco Use   Smoking status: Former    Types: Cigarettes, Cigars    Quit date: 09/19/2011    Years since quitting: 10.0   Smokeless tobacco: Never  Vaping Use    Vaping Use: Former  Substance and Sexual Activity   Alcohol use: No   Drug use: No   Sexual activity: Not on file  Other Topics Concern   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   Social Determinants of Health   Financial Resource Strain: Not on file  Food Insecurity: Not on file  Transportation Needs: Not on file  Physical Activity: Not on file  Stress: Not on file  Social Connections: Not on file  Intimate Partner Violence: Not on file    Past Surgical History:  Procedure Laterality Date   BLADDER SUSPENSION  2009   SMALL INTESTINE SURGERY     TUBAL LIGATION      Family History  Problem Relation Age of Onset   Heart disease Father    Kidney disease Father    Diabetes Father    Diabetes Maternal Grandmother    Diabetes Paternal Grandmother    Breast cancer Neg Hx     Allergies  Allergen Reactions   Flagyl [Metronidazole] Itching    Current Outpatient Medications on File Prior to Visit  Medication Sig Dispense Refill   atenolol (TENORMIN) 25 MG tablet Take 1 tablet (25 mg total) by mouth daily. 90 tablet 1   Biotin 1000 MCG tablet Take 1,000  mcg by mouth daily.     cyanocobalamin 500 MCG tablet Take 1,000 mcg by mouth daily. Reported on 06/12/2016     docusate sodium (COLACE) 100 MG capsule Take 200 mg by mouth daily. Reported on 06/12/2016     ibuprofen (ADVIL,MOTRIN) 200 MG tablet Take 600 mg by mouth every 6 (six) hours as needed. Reported on 06/12/2016     metFORMIN (GLUCOPHAGE) 500 MG tablet Take 1 tablet (500 mg total) by mouth 2 (two) times daily with a meal. 180 tablet 1   rosuvastatin (CRESTOR) 5 MG tablet TAKE 1 TABLET (5 MG TOTAL) BY MOUTH EVERY EVENING. FOR CHOLESTEROL. 90 tablet 3   No current facility-administered medications on file prior to visit.    BP 132/70   Pulse 83   Temp (!) 97.3 F (36.3 C) (Temporal)   Ht 5\' 5"  (1.651 m)   Wt 213 lb (96.6 kg)   LMP  11/03/2012   SpO2 97%   BMI 35.45 kg/m  Objective:   Physical Exam Musculoskeletal:     Lumbar back: No tenderness or bony tenderness. Normal range of motion. Negative right straight leg raise test and negative left straight leg raise test.       Back:     Comments: 5/5 strength to bilateral lower extremities, also to toes upon plantar flexion. Able to get up and down exam table without assistance.   Skin:    General: Skin is warm and dry.     Findings: No erythema.     Comments: Slight area of blue discoloration to left great toenail, appears to be bruising           Assessment & Plan:      This visit occurred during the SARS-CoV-2 public health emergency.  Safety protocols were in place, including screening questions prior to the visit, additional usage of staff PPE, and extensive cleaning of exam room while observing appropriate contact time as indicated for disinfecting solutions.

## 2021-10-10 NOTE — Patient Instructions (Signed)
Start prednisone tablets. Take two tablets my mouth once daily for four days, then one tablet once daily for four days.   You may take the cyclobenzaprine muscle relaxer at bedtime for muscle spasms.   Reduce your use of Ibuprofen.   Stretch and walk several times daily.   It was a pleasure to see you today!

## 2021-10-15 ENCOUNTER — Other Ambulatory Visit: Payer: Self-pay | Admitting: Internal Medicine

## 2021-10-30 NOTE — Telephone Encounter (Signed)
Rebecca Macias, your dates are incorrect in your message. Just FYI.

## 2021-10-30 NOTE — Telephone Encounter (Signed)
Message sent to patient to let you know you are out of the office.

## 2021-12-12 ENCOUNTER — Emergency Department
Admission: EM | Admit: 2021-12-12 | Discharge: 2021-12-12 | Disposition: A | Discharge status: Home / Self Care | Payer: Worker's Compensation | Attending: Emergency Medicine | Admitting: Emergency Medicine

## 2021-12-12 ENCOUNTER — Emergency Department: Payer: Worker's Compensation

## 2021-12-12 ENCOUNTER — Other Ambulatory Visit: Payer: Self-pay

## 2021-12-12 DIAGNOSIS — M545 Low back pain, unspecified: Secondary | ICD-10-CM | POA: Diagnosis present

## 2021-12-12 DIAGNOSIS — Y990 Civilian activity done for income or pay: Secondary | ICD-10-CM | POA: Diagnosis not present

## 2021-12-12 DIAGNOSIS — X500XXA Overexertion from strenuous movement or load, initial encounter: Secondary | ICD-10-CM | POA: Diagnosis not present

## 2021-12-12 MED ORDER — OXYCODONE-ACETAMINOPHEN 5-325 MG PO TABS
1.0000 | ORAL_TABLET | Freq: Once | ORAL | Status: AC
  Administered 2021-12-12: 1 via ORAL
  Filled 2021-12-12: qty 1

## 2021-12-12 MED ORDER — MELOXICAM 15 MG PO TABS: 15.0000 mg | ORAL_TABLET | Freq: Every day | ORAL | 1 refills | Status: AC

## 2021-12-12 MED ORDER — METHOCARBAMOL 500 MG PO TABS
500.0000 mg | ORAL_TABLET | Freq: Three times a day (TID) | ORAL | 0 refills | Status: AC | PRN
Start: 2021-12-12 — End: 2021-12-17

## 2021-12-12 MED ORDER — HYDROCODONE-ACETAMINOPHEN 5-325 MG PO TABS: 1.0000 | ORAL_TABLET | Freq: Four times a day (QID) | ORAL | 0 refills | Status: AC | PRN

## 2021-12-12 NOTE — Discharge Instructions (Addendum)
Take Meloxicam once daily for pain and inflammation.  Take Robaxin up to three times daily for pain.

## 2021-12-12 NOTE — ED Provider Notes (Signed)
[facility] Provider Note  Patient Contact: 8:21 PM (approximate)   History   Back Pain   HPI  Rebecca Macias is a 61 y.o. female presents to the emergency department with acute right-sided low back pain that occurred after patient had a episode of heavy lifting at work.  Patient describes pain as sharp and nonradiating.  She does have a history of sciatica in the past.  No prior history of lumbar strains.  No bowel or bladder incontinence or saddle anesthesia.      Physical Exam   Triage Vital Signs: ED Triage Vitals  Enc Vitals Group     BP 12/12/21 1756 (!) 161/65     Pulse Rate 12/12/21 1756 86     Resp 12/12/21 1756 18     Temp 12/12/21 1756 98.6 F (37 C)     Temp Source 12/12/21 1756 Oral     SpO2 12/12/21 1756 95 %     Weight 12/12/21 1755 208 lb (94.3 kg)     Height 12/12/21 1755 5\' 5"  (1.651 m)     Head Circumference --      Peak Flow --      Pain Score 12/12/21 1755 10     Pain Loc --      Pain Edu? --      Excl. in [place]? --     Most recent vital signs: Vitals:   12/12/21 1756  BP: (!) 161/65  Pulse: 86  Resp: 18  Temp: 98.6 F (37 C)  SpO2: 95%     General: Alert and in no acute distress. Eyes:  PERRL. EOMI. Head: No acute traumatic findings ENT:      Ears:       Nose: No congestion/rhinnorhea.      Mouth/Throat: Mucous membranes are moist. Neck: No stridor. No cervical spine tenderness to palpation. Hematological/Lymphatic/Immunilogical: No cervical lymphadenopathy.  Cardiovascular:  Good peripheral perfusion Respiratory: Normal respiratory effort without tachypnea or retractions. Lungs CTAB. Good air entry to the bases with no decreased or absent breath sounds. Gastrointestinal: Bowel sounds 4 quadrants. Soft and nontender to palpation. No guarding or rigidity. No palpable masses. No distention. No CVA tenderness. Musculoskeletal: Patient has symmetric strength in the upper and lower extremities.  Full range of  motion to all extremities.  She has paraspinal muscle tenderness along the lumbar spine. Neurologic:  No gross focal neurologic deficits are appreciated.  Skin:   No rash noted Other:   ED Results / Procedures / Treatments   Labs (all labs ordered are listed, but only abnormal results are displayed) Labs Reviewed - No data to display      RADIOLOGY  I personally viewed and evaluated these images as part of my medical decision making, as well as reviewing the written report by the radiologist.  ED Provider Interpretation: No acute bony abnormality on x-rays of the lumbar spine.      MEDICATIONS ORDERED IN ED: Medications  oxyCODONE-acetaminophen (PERCOCET/ROXICET) 5-325 MG per tablet 1 tablet (1 tablet Oral Given 12/12/21 1951)     IMPRESSION / MDM / ASSESSMENT AND PLAN / ED COURSE  I reviewed the triage vital signs and the nursing notes.                              Differential diagnosis includes, but is not limited to, lumbar strain, herniated disc, fracture...  Assessment and plan Low back pain:  61 year old female  presents to the emergency department with acute low back pain.  Percocet was given in the emergency department for pain.  Patient was hypertensive at triage but vital signs were otherwise reassuring.  Patient was alert, active and nontoxic-appearing with no neurodeficits on exam.  We will treat patient with a short course of Norco as well as meloxicam and Robaxin.  Patient was cautioned not to combine Robaxin and Norco.  She was given a work note.  Return precautions were given to return with new or worsening symptoms.     FINAL CLINICAL IMPRESSION(S) / ED DIAGNOSES   Final diagnoses:  Acute bilateral low back pain without sciatica     Rx / DC Orders   ED Discharge Orders          Ordered    meloxicam (MOBIC) 15 MG tablet  Daily        12/12/21 1931    methocarbamol (ROBAXIN) 500 MG tablet  Every 8 hours PRN        12/12/21 1931     HYDROcodone-acetaminophen (NORCO) 5-325 MG tablet  Every 6 hours PRN        12/12/21 1933             Note:  This document was prepared using Dragon voice recognition software and may include unintentional dictation errors.   Vallarie Mare [place], Hershal Coria 12/12/21 2024    Nance Pear, MD 12/12/21 2055

## 2021-12-12 NOTE — ED Triage Notes (Addendum)
Pt states she bent down at work and started having pain in her back.  No leg pain.   Pt states WC.  Pt alert  speech clear. Pt in wheelchair.

## 2021-12-12 NOTE — ED Provider Triage Note (Signed)
Emergency Medicine Provider Triage Evaluation Note  Rebecca Macias , a 61 y.o. female  was evaluated in triage.  Pt complains of pain of her work and had severe back pain.  Works at [employer].  No numbness or tingling, no loss of bowel or bladder control.  Review of Systems  Positive: Positive for back pain Negative: Abdominal pain, numbness or tingling  Physical Exam  BP (!) 161/65 (BP Location: Left Arm)    Pulse 86    Temp 98.6 F (37 C) (Oral)    Resp 18    Ht 5\' 5"  (1.651 m)    Wt 94.3 kg    LMP 11/03/2012    SpO2 95%    BMI 34.61 kg/m  Gen:   Awake, no distress   Resp:  Normal effort  MSK:   Moves extremities without difficulty  Other:    Medical Decision Making  Medically screening exam initiated at 5:58 PM.  Appropriate orders placed.  Rebecca Macias was informed that the remainder of the evaluation will be completed by another provider, this initial triage assessment does not replace that evaluation, and the importance of remaining in the ED until their evaluation is complete.     Versie Starks, PA-C 12/12/21 1759

## 2021-12-12 NOTE — ED Triage Notes (Signed)
Pt comes into the ED via EMS from work at [employer], states she bent over to pick up something and is having left lower back pain since. VSS

## 2021-12-16 ENCOUNTER — Encounter: Payer: Self-pay | Admitting: Emergency Medicine

## 2021-12-16 ENCOUNTER — Other Ambulatory Visit: Payer: Self-pay

## 2021-12-16 ENCOUNTER — Ambulatory Visit
Admission: EM | Admit: 2021-12-16 | Discharge: 2021-12-16 | Disposition: A | Discharge status: Home / Self Care | Payer: Worker's Compensation

## 2021-12-16 DIAGNOSIS — M545 Low back pain, unspecified: Secondary | ICD-10-CM | POA: Diagnosis not present

## 2021-12-16 DIAGNOSIS — R030 Elevated blood-pressure reading, without diagnosis of hypertension: Secondary | ICD-10-CM

## 2021-12-16 NOTE — ED Triage Notes (Signed)
Pt was seen in the ED on 12/12/21 and dx with a Lumbar strain. She is due to return to work today and unable to do so.

## 2021-12-16 NOTE — ED Provider Notes (Signed)
[facility]    CSN: [id number] Arrival date & time: 12/16/21  0900      History   Chief Complaint Chief Complaint  Patient presents with   Back Pain    HPI Rebecca Macias is a 61 y.o. female.  Patient presents with ongoing low back pain with 1 week.  The pain started after she bent over to pick something up.  No falls or trauma.  She denies numbness, weakness, paresthesias, saddle anesthesia, loss of bowel/bladder control, abdominal pain, vomiting, diarrhea, constipation, dysuria, hematuria, or other symptoms.  Patient was seen at [facility] ED on 12/12/2021; diagnosed with acute low back pain without sciatica; treated with oxycodone in the ED and discharged on meloxicam, methocarbamol, and hydrocodone-acetaminophen.  She states his medications have been helping her symptoms but she is unable to go back to work today.  Her medical history includes diabetes, diverticulitis, hypothyroidism.   The history is provided by the patient and medical records.   Past Medical History:  Diagnosis Date   H/O gestational diabetes mellitus, not currently pregnant    Ovarian cyst    Palpitations    Sigmoid diverticulitis 08/2012   Subclinical hypothyroidism    Type 2 diabetes mellitus ([place])     Patient Active Problem List   Diagnosis Date Noted   Acute low back pain 10/10/2021   Non-insulin dependent type 2 diabetes mellitus ([facility]) 07/10/2016   GERD (gastroesophageal reflux disease) 07/02/2016   Subclinical hyperthyroidism 06/12/2016   Palpitations 05/08/2015   Insomnia 05/08/2015   Symptoms, such as flushing, sleeplessness, headache, lack of concentration, associated with the menopause 05/08/2015   Thyroid nodule, cold 02/12/2015   Routine general medical examination at a health care facility 04/20/2014   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      Home Medications    Prior to Admission medications   Medication Sig Start Date End Date Taking? Authorizing Provider  atenolol (TENORMIN) 25 MG tablet TAKE 1 TABLET (25 MG TOTAL) BY MOUTH DAILY. 10/15/21  Yes Philemon Kingdom, MD  Biotin 1000 MCG tablet Take 1,000 mcg by mouth daily.   Yes [provider]  cyanocobalamin 500 MCG tablet Take 1,000 mcg by mouth daily. Reported on 06/12/2016   Yes [provider]  cyclobenzaprine (FLEXERIL) 5 MG tablet Take 1 tablet (5 mg total) by mouth at bedtime as needed for muscle spasms. 10/10/21  Yes Pleas Koch, NP  docusate sodium (COLACE) 100 MG capsule Take 200 mg by mouth daily. Reported on 06/12/2016   Yes [provider]  ibuprofen (ADVIL,MOTRIN) 200 MG tablet Take 600 mg by mouth every 6 (six) hours as needed. Reported on 06/12/2016   Yes [provider]  meloxicam (MOBIC) 15 MG tablet Take 1 tablet (15 mg total) by mouth daily for 7 days. 12/12/21 12/19/21 Yes Vallarie Mare M, PA-C  metFORMIN (GLUCOPHAGE) 500 MG tablet Take 1 tablet (500 mg total) by mouth 2 (two) times daily with a meal. 04/10/21  Yes Philemon Kingdom, MD  methocarbamol (ROBAXIN) 500 MG tablet Take 1 tablet (500 mg total) by mouth every 8 (eight) hours as needed for up to 5 days. 12/12/21 12/17/21 Yes Vallarie Mare M, PA-C  rosuvastatin (  CRESTOR) 5 MG tablet TAKE 1 TABLET (5 MG TOTAL) BY MOUTH EVERY EVENING. FOR CHOLESTEROL. 09/29/21  Yes Pleas Koch, NP  predniSONE (DELTASONE) 20 MG tablet Take two tablets my mouth once daily for four days, then one tablet once daily for four days. 10/10/21   Pleas Koch, NP    Family History Family History  Problem Relation Age of Onset   Heart disease Father    Kidney disease Father    Diabetes Father    Diabetes Maternal Grandmother    Diabetes Paternal Grandmother    Breast cancer Neg Hx      Social History Social History   Tobacco Use   Smoking status: Former    Types: Cigarettes, Cigars    Quit date: 09/19/2011    Years since quitting: 10.2   Smokeless tobacco: Never  Vaping Use   Vaping Use: Former  Substance Use Topics   Alcohol use: No   Drug use: No     Allergies   Flagyl [metronidazole]   Review of Systems Review of Systems  Constitutional:  Negative for chills and fever.  Gastrointestinal:  Negative for abdominal pain, constipation, diarrhea and vomiting.  Genitourinary:  Negative for dysuria and hematuria.  Musculoskeletal:  Positive for back pain. Negative for gait problem.  Skin:  Negative for color change, rash and wound.  Neurological:  Negative for weakness and numbness.  All other systems reviewed and are negative.   Physical Exam Triage Vital Signs ED Triage Vitals [12/16/21 0915]  Enc Vitals Group     BP      Pulse      Resp      Temp      Temp src      SpO2      Weight      Height      Head Circumference      Peak Flow      Pain Score 8     Pain Loc      Pain Edu?      Excl. in [place]?    No data found.  Updated Vital Signs BP (!) 151/103 (BP Location: Left Arm)    Pulse (!) 101    Temp 98.1 F (36.7 C)    Resp 18    LMP 11/03/2012    SpO2 97%   Visual Acuity Right Eye Distance:   Left Eye Distance:   Bilateral Distance:    Right Eye Near:   Left Eye Near:    Bilateral Near:     Physical Exam Vitals and nursing note reviewed.  Constitutional:      General: She is not in acute distress.    Appearance: She is well-developed. She is not ill-appearing.  HENT:     Mouth/Throat:     Mouth: Mucous membranes are moist.  Cardiovascular:     Rate and Rhythm: Normal rate and regular rhythm.     Heart sounds: Normal heart sounds.  Pulmonary:     Effort: Pulmonary effort is normal. No respiratory distress.     Breath sounds: Normal breath sounds.  Abdominal:     Palpations: Abdomen is soft.     Tenderness: There is  no abdominal tenderness. There is no guarding or rebound.  Musculoskeletal:        General: No swelling, tenderness, deformity or signs of injury. Normal range of motion.     Cervical back: Neck supple.       Back:  Skin:    General:  Skin is warm and dry.     Capillary Refill: Capillary refill takes less than 2 seconds.     Findings: No bruising, erythema, lesion or rash.  Neurological:     General: No focal deficit present.     Mental Status: She is alert and oriented to person, place, and time.     Sensory: No sensory deficit.     Motor: No weakness.     Gait: Gait normal.  Psychiatric:        Mood and Affect: Mood normal.        Behavior: Behavior normal.     UC Treatments / Results  Labs (all labs ordered are listed, but only abnormal results are displayed) Labs Reviewed - No data to display  EKG   Radiology No results found.  Procedures Procedures (including critical care time)  Medications Ordered in UC Medications - No data to display  Initial Impression / Assessment and Plan / UC Course  I have reviewed the triage vital signs and the nursing notes.  Pertinent labs & imaging results that were available during my care of the patient were reviewed by me and considered in my medical decision making (see chart for details).    Acute low back pain without sciatica. Elevated blood pressure reading.   Patient was seen in the ED on 12/12/2021; she reports the medications she was prescribed are helping.  She is not able to go to work today and needs a work note.  Work note provided per patient request.  Instructed her to continue the medications as needed.  Also discussed that her blood pressure is elevated today and needs to be rechecked by her PCP in 1 to 2 weeks.  She agrees to plan of care.   Final Clinical Impressions(s) / UC Diagnoses   Final diagnoses:  Acute midline low back pain without sciatica  Elevated blood pressure reading     Discharge Instructions       Continue the medications prescribed in the emergency department.  Follow up with an orthopedist.   Your blood pressure is elevated today at 151/103.  Please have this rechecked by your primary care provider in 1-2 weeks.          ED Prescriptions   None    I have reviewed the PDMP during this encounter.   Sharion Balloon, NP 12/16/21 [phone]

## 2021-12-16 NOTE — Discharge Instructions (Addendum)
Continue the medications prescribed in the emergency department.  Follow up with an orthopedist.   Your blood pressure is elevated today at 151/103.  Please have this rechecked by your primary care provider in 1-2 weeks.

## 2021-12-19 ENCOUNTER — Other Ambulatory Visit: Payer: Self-pay | Admitting: Internal Medicine

## 2021-12-20 ENCOUNTER — Other Ambulatory Visit: Payer: Self-pay | Admitting: Primary Care

## 2021-12-20 DIAGNOSIS — M5442 Lumbago with sciatica, left side: Secondary | ICD-10-CM

## 2022-02-26 ENCOUNTER — Ambulatory Visit: Payer: Commercial Managed Care - PPO | Admitting: Primary Care

## 2022-03-27 ENCOUNTER — Encounter: Payer: Self-pay | Admitting: Primary Care

## 2022-03-27 ENCOUNTER — Ambulatory Visit: Payer: Commercial Managed Care - PPO | Admitting: Primary Care

## 2022-03-27 VITALS — BP 124/64 | HR 79 | Temp 97.9°F | Ht 65.0 in | Wt 214.0 lb

## 2022-03-27 DIAGNOSIS — E119 Type 2 diabetes mellitus without complications: Secondary | ICD-10-CM

## 2022-03-27 LAB — POCT GLYCOSYLATED HEMOGLOBIN (HGB A1C): Hemoglobin A1C: 7 % — AB (ref 4.0–5.6)

## 2022-03-27 MED ORDER — BLOOD GLUCOSE MONITOR KIT: PACK | 0 refills | Status: DC

## 2022-03-27 NOTE — Assessment & Plan Note (Addendum)
Controlled with A1C today of 7.0. ? ?Continue metformin 500 mg BID. ? ?Encouraged her to work on a healthy diet and to increase exercise. ? ?Urine microalbumin UTD. ?Foot exam UTD. ?Managed on statin. ? ?Glucometer kit faxed to pharmacy.  Discussed instructions for checking glucose levels. ? ?Follow up in 6 months.  ?

## 2022-03-27 NOTE — Patient Instructions (Addendum)
Continue metformin 500 mg twice daily. ? ?Increase exercise. You should be getting 150 minutes of moderate intensity exercise weekly. ? ?Reduce starchy foods such as breads/pasta/rice/potatoes.  Stop eating jellybeans. ? ?Appropriate times to check your blood sugar levels are: ? ?-Before any meal (breakfast, lunch, dinner) ?-Two hours after any meal (breakfast, lunch, dinner) ?-Bedtime ? ?Record your readings and notify me if you continue to consistently run at or above 150.  ? ?Schedule your physical for 6 months. ? ?It was a pleasure to see you today! ? ?

## 2022-03-27 NOTE — Progress Notes (Signed)
? ?Subjective:  ? ? Patient ID: Rebecca Macias, female    DOB: 1961/04/23, 61 y.o.   MRN: [id number] ? ?HPI ? ?Rebecca Macias is a very pleasant 61 y.o. female with a history of type 2 diabetes, subclinical hypothyroidism, palpitations, insomnia who presents today for follow-up of diabetes. ? ?Current medications include: Metformin 500 mg twice daily.  ? ?She does check glucose readings at work on occasion, typically sees 90's.  ? ?Last A1C: 7.0 in September 2022, 7.0 today  ?Last Eye Exam: Due ?Last Foot Exam: Up-to-date ?Pneumonia Vaccination: 2018 ?Urine Microalbumin: Up-to-date ?Statin: Rosuvastatin ? ?Dietary changes since last visit: Overall a healthy diet. Is working on cutting back on breads.  ? ? ?Exercise: Is active at work.  ? ? ? ? ? ?Review of Systems  ?Eyes:  Negative for visual disturbance.  ?Respiratory:  Negative for shortness of breath.   ?Cardiovascular:  Negative for chest pain.  ?Neurological:  Positive for numbness.  ? ?   ? ? ?Past Medical History:  ?Diagnosis Date  ? H/O gestational diabetes mellitus, not currently pregnant   ? Ovarian cyst   ? Palpitations   ? Sigmoid diverticulitis 08/2012  ? Subclinical hypothyroidism   ? Type 2 diabetes mellitus ([place])   ? ? ?Social History  ? ?Socioeconomic History  ? Marital status: Divorced  ?  Spouse name: Not on file  ? Number of children: Not on file  ? Years of education: Not on file  ? Highest education level: Not on file  ?Occupational History  ? Not on file  ?Tobacco Use  ? Smoking status: Former  ?  Types: Cigarettes, Cigars  ?  Quit date: 09/19/2011  ?  Years since quitting: 10.5  ? Smokeless tobacco: Never  ?Vaping Use  ? Vaping Use: Former  ?Substance and Sexual Activity  ? Alcohol use: No  ? Drug use: No  ? Sexual activity: Not on file  ?Other Topics Concern  ? Not on file  ?Social History Narrative  ? Works in Arboriculturist at [employer].  ? 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  ? ?Social Determinants of Health  ? ?Financial  Resource Strain: Not on file  ?Food Insecurity: Not on file  ?Transportation Needs: Not on file  ?Physical Activity: Not on file  ?Stress: Not on file  ?Social Connections: Not on file  ?Intimate Partner Violence: Not on file  ? ? ?Past Surgical History:  ?Procedure Laterality Date  ? BLADDER SUSPENSION  2009  ? SMALL INTESTINE SURGERY    ? TUBAL LIGATION    ? ? ?Family History  ?Problem Relation Age of Onset  ? Heart disease Father   ? Kidney disease Father   ? Diabetes Father   ? Diabetes Maternal Grandmother   ? Diabetes Paternal Grandmother   ? Breast cancer Neg Hx   ? ? ?Allergies  ?Allergen Reactions  ? Flagyl [Metronidazole] Itching  ? ? ?Current Outpatient Medications on File Prior to Visit  ?Medication Sig Dispense Refill  ? atenolol (TENORMIN) 25 MG tablet TAKE 1 TABLET (25 MG TOTAL) BY MOUTH DAILY. 90 tablet 1  ? Biotin 1000 MCG tablet Take 1,000 mcg by mouth daily.    ? cyanocobalamin 500 MCG tablet Take 1,000 mcg by mouth daily. Reported on 06/12/2016    ? cyclobenzaprine (FLEXERIL) 5 MG tablet Take 1 tablet (5 mg total) by mouth at bedtime as needed for muscle spasms. 15 tablet 0  ? docusate sodium (COLACE) 100 MG  capsule Take 200 mg by mouth daily. Reported on 06/12/2016    ? metFORMIN (GLUCOPHAGE) 500 MG tablet TAKE 1 TABLET BY MOUTH 2 TIMES DAILY WITH A MEAL. 180 tablet 1  ? rosuvastatin (CRESTOR) 5 MG tablet TAKE 1 TABLET (5 MG TOTAL) BY MOUTH EVERY EVENING. FOR CHOLESTEROL. 90 tablet 3  ? ?No current facility-administered medications on file prior to visit.  ? ? ?BP 124/64 (BP Location: Left Arm, Patient Position: Sitting, Cuff Size: Normal)   Pulse 79   Temp 97.9 ?F (36.6 ?C) (Temporal)   Ht '5\' 5"'$  (1.651 m)   Wt 214 lb (97.1 kg)   LMP 11/03/2012   SpO2 98%   BMI 35.61 kg/m?  ?Objective:  ? Physical Exam ?Cardiovascular:  ?   Rate and Rhythm: Normal rate and regular rhythm.  ?Pulmonary:  ?   Effort: Pulmonary effort is normal.  ?   Breath sounds: Normal breath sounds.  ?Musculoskeletal:  ?    Cervical back: Neck supple.  ?Skin: ?   General: Skin is warm and dry.  ? ? ? ? ? ?   ?Assessment & Plan:  ? ? ? ? ?This visit occurred during the SARS-CoV-2 public health emergency.  Safety protocols were in place, including screening questions prior to the visit, additional usage of staff PPE, and extensive cleaning of exam room while observing appropriate contact time as indicated for disinfecting solutions.  ?

## 2022-04-08 DIAGNOSIS — E119 Type 2 diabetes mellitus without complications: Secondary | ICD-10-CM

## 2022-04-12 ENCOUNTER — Other Ambulatory Visit: Payer: Self-pay | Admitting: Internal Medicine

## 2022-04-15 NOTE — Telephone Encounter (Signed)
Pt went by pharmacy today and they stated to her "that her blood glucose meter is not in her chart, so she could not get it today." Wants to speak to the nurse about this. ? ?Callback Number: [phone] ?

## 2022-04-24 MED ORDER — BLOOD GLUCOSE MONITOR KIT: PACK | 0 refills | Status: AC

## 2022-04-24 NOTE — Telephone Encounter (Signed)
Printed and placed in [place].

## 2022-04-24 NOTE — Telephone Encounter (Signed)
It will not let me print script can you print and I will re fax?

## 2022-04-25 NOTE — Telephone Encounter (Signed)
Re faxed ppw

## 2022-07-10 ENCOUNTER — Other Ambulatory Visit: Payer: Self-pay | Admitting: Internal Medicine

## 2022-08-28 ENCOUNTER — Encounter: Payer: Self-pay | Admitting: Primary Care

## 2022-08-28 ENCOUNTER — Other Ambulatory Visit (INDEPENDENT_AMBULATORY_CARE_PROVIDER_SITE_OTHER): Payer: Commercial Managed Care - PPO

## 2022-08-28 ENCOUNTER — Ambulatory Visit (INDEPENDENT_AMBULATORY_CARE_PROVIDER_SITE_OTHER): Payer: Commercial Managed Care - PPO | Admitting: Primary Care

## 2022-08-28 VITALS — BP 138/66 | HR 79 | Temp 97.4°F | Ht 64.0 in | Wt 219.0 lb

## 2022-08-28 DIAGNOSIS — Z0000 Encounter for general adult medical examination without abnormal findings: Secondary | ICD-10-CM

## 2022-08-28 DIAGNOSIS — E119 Type 2 diabetes mellitus without complications: Secondary | ICD-10-CM | POA: Diagnosis not present

## 2022-08-28 DIAGNOSIS — G8929 Other chronic pain: Secondary | ICD-10-CM

## 2022-08-28 DIAGNOSIS — Z1211 Encounter for screening for malignant neoplasm of colon: Secondary | ICD-10-CM

## 2022-08-28 DIAGNOSIS — Z1231 Encounter for screening mammogram for malignant neoplasm of breast: Secondary | ICD-10-CM | POA: Diagnosis not present

## 2022-08-28 DIAGNOSIS — M5442 Lumbago with sciatica, left side: Secondary | ICD-10-CM

## 2022-08-28 DIAGNOSIS — Z23 Encounter for immunization: Secondary | ICD-10-CM

## 2022-08-28 DIAGNOSIS — E0590 Thyrotoxicosis, unspecified without thyrotoxic crisis or storm: Secondary | ICD-10-CM | POA: Diagnosis not present

## 2022-08-28 LAB — MICROALBUMIN / CREATININE URINE RATIO
Creatinine,U: 62.9 mg/dL
Microalb Creat Ratio: 1.1 mg/g (ref 0.0–30.0)
Microalb, Ur: <0.7 mg/dL (ref 0.0–1.9)

## 2022-08-28 LAB — CBC
HCT: 39.5 % (ref 36.0–46.0)
Hemoglobin: 13.3 g/dL (ref 12.0–15.0)
MCHC: 33.8 g/dL (ref 30.0–36.0)
MCV: 90.7 fl (ref 78.0–100.0)
Platelets: 272 10*3/uL (ref 150.0–400.0)
RBC: 4.35 Mil/uL (ref 3.87–5.11)
RDW: 12.7 % (ref 11.5–15.5)
WBC: 4 10*3/uL (ref 4.0–10.5)

## 2022-08-28 LAB — COMPREHENSIVE METABOLIC PANEL
ALT: 27 U/L (ref 0–35)
AST: 34 U/L (ref 0–37)
Albumin: 3.9 g/dL (ref 3.5–5.2)
Alkaline Phosphatase: 89 U/L (ref 39–117)
BUN: 12 mg/dL (ref 6–23)
CO2: 25 mEq/L (ref 19–32)
Calcium: 9.5 mg/dL (ref 8.4–10.5)
Chloride: 107 mEq/L (ref 96–112)
Creatinine, Ser: 0.67 mg/dL (ref 0.40–1.20)
GFR: 95.22 mL/min (ref >60.00)
Glucose, Bld: 187 mg/dL — ABNORMAL HIGH (ref 70–99)
Potassium: 4.2 mEq/L (ref 3.5–5.1)
Sodium: 140 mEq/L (ref 135–145)
Total Bilirubin: 0.7 mg/dL (ref 0.2–1.2)
Total Protein: 6.8 g/dL (ref 6.0–8.3)

## 2022-08-28 LAB — LIPID PANEL
Cholesterol: 137 mg/dL (ref 0–200)
HDL: 54.7 mg/dL (ref >39.00)
LDL Cholesterol: 61 mg/dL (ref 0–99)
NonHDL: 81.84
Total CHOL/HDL Ratio: 2
Triglycerides: 106 mg/dL (ref 0.0–149.0)
VLDL: 21.2 mg/dL (ref 0.0–40.0)

## 2022-08-28 NOTE — Patient Instructions (Signed)
Stop by the lab prior to leaving today. I will notify you of your results once received.   You will be contacted regarding your referral to GI for the colonoscopy.  Please let us know if you have not been contacted within two weeks.   [facility] your mamm[facility].   Please schedule a follow up visit for 6 months for a diabetes check.  It was a pleasure to see you today!

## 2022-08-28 NOTE — Assessment & Plan Note (Addendum)
Controlled.  No longer following with endocrinology. Continue atenolol 25 mg daily.  Repeat thyroid studies pending.

## 2022-08-28 NOTE — Assessment & Plan Note (Signed)
Repeat A1C pending.  Continue metformin 500 mg BID. Continue statin therapy. Foot exam UTD. Eye exam UTD per patient. Urine microalbumin due and pending. Pneumonia vaccine UTD.  Consider Ozempic if warranted.   Follow up in 6 months.

## 2022-08-28 NOTE — Progress Notes (Signed)
Subjective:    Patient ID: [name], female    DOB: 04-06-1961, 61 y.o.   MRN: [id number]  HPI  LITZI BINNING is a very pleasant 61 y.o. female who presents today for complete physical and follow up of chronic conditions.  Immunizations: -Tetanus: 2016 -Influenza: Due today  -Covid-19: 4 vaccines -Shingles: Completed Shingrix -Pneumonia: 2018  Diet: Fair diet.  Exercise: No regular exercise. Active at work.  Eye exam: Completes annually  Dental exam: Completes semi-annually   Pap Smear: Completed in 2021 Mammogram: Completed in 2022  Colonoscopy: Completed in 2013  BP Readings from Last 3 Encounters:  08/28/22 138/66  03/27/22 124/64  12/16/21 126/83      Review of Systems  Constitutional:  Negative for unexpected weight change.  HENT:  Negative for rhinorrhea.   Respiratory:  Negative for cough and shortness of breath.   Cardiovascular:  Negative for chest pain.  Gastrointestinal:  Negative for constipation and diarrhea.  Genitourinary:  Negative for difficulty urinating and menstrual problem.  Musculoskeletal:  Positive for arthralgias and back pain.  Skin:  Negative for rash.  Allergic/Immunologic: Negative for environmental allergies.  Neurological:  Positive for numbness. Negative for dizziness and headaches.  Psychiatric/Behavioral:  The patient is not nervous/anxious.          Past Medical History:  Diagnosis Date   H/O gestational diabetes mellitus, not currently pregnant    Ovarian cyst    Palpitations    Sigmoid diverticulitis 08/2012   Subclinical hypothyroidism    Type 2 diabetes mellitus ([place])     Social History   Socioeconomic History   Marital status: Divorced    Spouse name: Not on file   Number of children: Not on file   Years of education: Not on file   Highest education level: Not on file  Occupational History   Not on file  Tobacco Use   Smoking status: Former    Types: Cigarettes, Cigars    Quit date: 09/19/2011     Years since quitting: 10.9   Smokeless tobacco: Never  Vaping Use   Vaping Use: Former  Substance and Sexual Activity   Alcohol use: No   Drug use: No   Sexual activity: Not on file  Other Topics Concern   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   Social Determinants of Health   Financial Resource Strain: Not on file  Food Insecurity: Not on file  Transportation Needs: Not on file  Physical Activity: Not on file  Stress: Not on file  Social Connections: Not on file  Intimate Partner Violence: Not on file    Past Surgical History:  Procedure Laterality Date   BLADDER SUSPENSION  2009   SMALL INTESTINE SURGERY     TUBAL LIGATION      Family History  Problem Relation Age of Onset   Heart disease Father    Kidney disease Father    Diabetes Father    Diabetes Maternal Grandmother    Diabetes Paternal Grandmother    Breast cancer Neg Hx     Allergies  Allergen Reactions   Flagyl [Metronidazole] Itching    Current Outpatient Medications on File Prior to Visit  Medication Sig Dispense Refill   atenolol (TENORMIN) 25 MG tablet TAKE 1 TABLET (25 MG TOTAL) BY MOUTH DAILY. 90 tablet 1   Biotin 1000 MCG tablet Take 1,000 mcg by mouth  daily.     blood glucose meter kit and supplies KIT Dispense based on patient and insurance preference. Use up to four times daily as directed. (FOR ICD-9 250.00, 250.01). 1 each 0   cyanocobalamin 500 MCG tablet Take 1,000 mcg by mouth daily. Reported on 06/12/2016     docusate sodium (COLACE) 100 MG capsule Take 200 mg by mouth daily. Reported on 06/12/2016     meloxicam (MOBIC) 15 MG tablet Take 1 tablet by mouth daily.     metFORMIN (GLUCOPHAGE) 500 MG tablet TAKE 1 TABLET BY MOUTH 2 TIMES DAILY WITH A MEAL. 180 tablet 1   rosuvastatin (CRESTOR) 5 MG tablet TAKE 1 TABLET (5 MG TOTAL) BY MOUTH EVERY EVENING. FOR CHOLESTEROL. 90 tablet 3   No current  facility-administered medications on file prior to visit.    BP 138/66   Pulse 79   Temp (!) 97.4 F (36.3 C) (Temporal)   Ht '5\' 4"'  (1.626 m)   Wt 219 lb (99.3 kg)   LMP 11/03/2012   SpO2 96%   BMI 37.59 kg/m  Objective:   Physical Exam HENT:     Right Ear: Tympanic membrane and ear canal normal.     Left Ear: Tympanic membrane and ear canal normal.     Nose: Nose normal.  Eyes:     Conjunctiva/sclera: Conjunctivae normal.     Pupils: Pupils are equal, round, and reactive to light.  Neck:     Thyroid: No thyromegaly.  Cardiovascular:     Rate and Rhythm: Normal rate and regular rhythm.     Heart sounds: No murmur heard. Pulmonary:     Effort: Pulmonary effort is normal.     Breath sounds: Normal breath sounds. No rales.  Abdominal:     General: Bowel sounds are normal.     Palpations: Abdomen is soft.     Tenderness: There is no abdominal tenderness.  Musculoskeletal:        General: Normal range of motion.     Cervical back: Neck supple.  Lymphadenopathy:     Cervical: No cervical adenopathy.  Skin:    General: Skin is warm and dry.     Findings: No rash.  Neurological:     Mental Status: She is alert and oriented to person, place, and time.     Cranial Nerves: No cranial nerve deficit.     Deep Tendon Reflexes: Reflexes are normal and symmetric.  Psychiatric:        Mood and Affect: Mood normal.           Assessment & Plan:   Problem List Items Addressed This Visit       Endocrine   Subclinical hyperthyroidism    Controlled.  No longer following with endocrinology. Continue atenolol 25 mg daily.  Repeat thyroid studies pending.      Relevant Orders   TSH   T4, free   Non-insulin dependent type 2 diabetes mellitus ([place])    Repeat A1C pending.  Continue metformin 500 mg BID. Continue statin therapy. Foot exam UTD. Eye exam UTD per patient. Urine microalbumin due and pending. Pneumonia vaccine UTD.  Consider Ozempic if warranted.    Follow up in 6 months.       Relevant Orders   Microalbumin / creatinine urine ratio   Lipid panel   Hemoglobin A1c   Comprehensive metabolic panel   CBC     Nervous and Auditory   Chronic low back pain with left-sided sciatica  Following with physical therapy and orthopedics. Overall improving.  Continue Meloxicam 15 mg PRN. Continue cyclobenzaprine 5 mg PRN.      Relevant Medications   meloxicam (MOBIC) 15 MG tablet     Other   Routine general medical examination at a health care facility - Primary    Immunizations UTD. Influenza vaccine provided today. Pap smear UTD. Mammogram due, orders placed. Colonoscopy due, referral placed to GI.  Discussed the importance of a healthy diet and regular exercise in order for weight loss, and to reduce the risk of further co-morbidity.  Exam stable. Labs pending.  Follow up in 1 year for repeat physical.       Other Visit Diagnoses     Encounter for screening mammogram for malignant neoplasm of breast       Relevant Orders   MM 3D SCREEN BREAST BILATERAL   Screening for colon cancer       Relevant Orders   Ambulatory referral to Gastroenterology          Pleas Koch, NP

## 2022-08-28 NOTE — Assessment & Plan Note (Signed)
Following with physical therapy and orthopedics. Overall improving.  Continue Meloxicam 15 mg PRN. Continue cyclobenzaprine 5 mg PRN.

## 2022-08-28 NOTE — Assessment & Plan Note (Signed)
Immunizations UTD. Influenza vaccine provided today. Pap smear UTD. Mammogram due, orders placed. Colonoscopy due, referral placed to GI.  Discussed the importance of a healthy diet and regular exercise in order for weight loss, and to reduce the risk of further co-morbidity.  Exam stable. Labs pending.  Follow up in 1 year for repeat physical.

## 2022-08-29 LAB — TSH: TSH: 0.8 u[IU]/mL (ref 0.35–5.50)

## 2022-08-29 LAB — T4, FREE: Free T4: 1.12 ng/dL (ref 0.60–1.60)

## 2022-09-01 DIAGNOSIS — E119 Type 2 diabetes mellitus without complications: Secondary | ICD-10-CM

## 2022-09-01 LAB — HEMOGLOBIN A1C: Hgb A1c MFr Bld: 8.3 % — ABNORMAL HIGH (ref 4.6–6.5)

## 2022-09-01 MED ORDER — OZEMPIC (0.25 OR 0.5 MG/DOSE) 2 MG/3ML ~~LOC~~ SOPN: PEN_INJECTOR | SUBCUTANEOUS | 0 refills | Status: DC

## 2022-09-02 ENCOUNTER — Other Ambulatory Visit: Payer: Self-pay

## 2022-09-02 ENCOUNTER — Telehealth: Payer: Self-pay

## 2022-09-02 DIAGNOSIS — Z1211 Encounter for screening for malignant neoplasm of colon: Secondary | ICD-10-CM

## 2022-09-02 MED ORDER — NA SULFATE-K SULFATE-MG SULF 17.5-3.13-1.6 GM/177ML PO SOLN: 1.0000 | Freq: Once | ORAL | 0 refills | Status: AC

## 2022-09-02 NOTE — Telephone Encounter (Signed)
Will you see if patient is eligible for an Ozempic coupon?

## 2022-09-02 NOTE — Telephone Encounter (Signed)
Gastroenterology Pre-Procedure Review  Request Date: 10/01/22 Requesting Physician: Dr. VAnga  PATIENT REVIEW QUESTIONS: The patient responded to the following health history questions as indicated:    1. Are you having any GI issues? no 2. Do you have a personal history of Polyps? no 3. Do you have a family history of Colon Cancer or Polyps? no 4. Diabetes Mellitus? yes (type 2 metformin has been advised to hold 2 days prior ) 5. Joint replacements in the past 12 months?no 6. Major health problems in the past 3 months?no 7. Any artificial heart valves, MVP, or defibrillator?no    MEDICATIONS & ALLERGIES:    Patient reports the following regarding taking any anticoagulation/antiplatelet therapy:   Plavix, Coumadin, Eliquis, Xarelto, Lovenox, Pradaxa, Brilinta, or Effient? no Aspirin? no  Patient confirms/reports the following medications:  Current Outpatient Medications  Medication Sig Dispense Refill   atenolol (TENORMIN) 25 MG tablet TAKE 1 TABLET (25 MG TOTAL) BY MOUTH DAILY. 90 tablet 1   Biotin 1000 MCG tablet Take 1,000 mcg by mouth daily.     blood glucose meter kit and supplies KIT Dispense based on patient and insurance preference. Use up to four times daily as directed. (FOR ICD-9 250.00, 250.01). 1 each 0   cyanocobalamin 500 MCG tablet Take 1,000 mcg by mouth daily. Reported on 06/12/2016     docusate sodium (COLACE) 100 MG capsule Take 200 mg by mouth daily. Reported on 06/12/2016     meloxicam (MOBIC) 15 MG tablet Take 1 tablet by mouth daily.     metFORMIN (GLUCOPHAGE) 500 MG tablet TAKE 1 TABLET BY MOUTH 2 TIMES DAILY WITH A MEAL. 180 tablet 1   rosuvastatin (CRESTOR) 5 MG tablet TAKE 1 TABLET (5 MG TOTAL) BY MOUTH EVERY EVENING. FOR CHOLESTEROL. 90 tablet 3   Semaglutide,0.25 or 0.5MG/DOS, (OZEMPIC, 0.25 OR 0.5 MG/DOSE,) 2 MG/3ML SOPN Inject 0.25 mg into the skin once weekly x 4 weeks, then increase to 0.5 mg weekly thereafter for diabetes. 9 mL 0   No current  facility-administered medications for this visit.    Patient confirms/reports the following allergies:  Allergies  Allergen Reactions   Flagyl [Metronidazole] Itching    No orders of the defined types were placed in this encounter.   AUTHORIZATION INFORMATION Primary Insurance: 1D#: Group #:  Secondary Insurance: 1D#: Group #:  SCHEDULE INFORMATION: Date:  Time: Location:  

## 2022-09-03 ENCOUNTER — Ambulatory Visit
Admission: RE | Admit: 2022-09-03 | Discharge: 2022-09-03 | Disposition: A | Discharge status: Home / Self Care | Payer: Commercial Managed Care - PPO | Source: Ambulatory Visit | Attending: Primary Care | Admitting: Primary Care

## 2022-09-03 DIAGNOSIS — Z1231 Encounter for screening mammogram for malignant neoplasm of breast: Secondary | ICD-10-CM | POA: Insufficient documentation

## 2022-09-25 ENCOUNTER — Other Ambulatory Visit: Payer: Self-pay | Admitting: Internal Medicine

## 2022-09-25 ENCOUNTER — Other Ambulatory Visit: Payer: Self-pay | Admitting: Primary Care

## 2022-09-25 DIAGNOSIS — E785 Hyperlipidemia, unspecified: Secondary | ICD-10-CM

## 2022-10-01 ENCOUNTER — Telehealth: Payer: Self-pay

## 2022-10-01 ENCOUNTER — Encounter
Admission: RE | Disposition: A | Discharge status: Home / Self Care | Payer: Self-pay | Source: Home / Self Care | Attending: Gastroenterology

## 2022-10-01 ENCOUNTER — Ambulatory Visit: Payer: Commercial Managed Care - PPO | Admitting: Certified Registered Nurse Anesthetist

## 2022-10-01 ENCOUNTER — Ambulatory Visit
Admission: RE | Admit: 2022-10-01 | Discharge: 2022-10-01 | Disposition: A | Discharge status: Home / Self Care | Payer: Commercial Managed Care - PPO | Attending: Gastroenterology | Admitting: Gastroenterology

## 2022-10-01 DIAGNOSIS — K5730 Diverticulosis of large intestine without perforation or abscess without bleeding: Secondary | ICD-10-CM | POA: Diagnosis not present

## 2022-10-01 DIAGNOSIS — Z87891 Personal history of nicotine dependence: Secondary | ICD-10-CM | POA: Diagnosis not present

## 2022-10-01 DIAGNOSIS — R933 Abnormal findings on diagnostic imaging of other parts of digestive tract: Secondary | ICD-10-CM

## 2022-10-01 DIAGNOSIS — K635 Polyp of colon: Secondary | ICD-10-CM | POA: Diagnosis not present

## 2022-10-01 DIAGNOSIS — Z1211 Encounter for screening for malignant neoplasm of colon: Secondary | ICD-10-CM

## 2022-10-01 DIAGNOSIS — D121 Benign neoplasm of appendix: Secondary | ICD-10-CM | POA: Diagnosis not present

## 2022-10-01 DIAGNOSIS — E669 Obesity, unspecified: Secondary | ICD-10-CM

## 2022-10-01 DIAGNOSIS — K621 Rectal polyp: Secondary | ICD-10-CM

## 2022-10-01 DIAGNOSIS — E119 Type 2 diabetes mellitus without complications: Secondary | ICD-10-CM | POA: Diagnosis not present

## 2022-10-01 HISTORY — PX: COLONOSCOPY WITH PROPOFOL: SHX5780

## 2022-10-01 LAB — GLUCOSE, CAPILLARY: Glucose-Capillary: 150 mg/dL — ABNORMAL HIGH (ref 70–99)

## 2022-10-01 SURGERY — COLONOSCOPY WITH PROPOFOL
Anesthesia: General

## 2022-10-01 MED ORDER — PROPOFOL 10 MG/ML IV BOLUS
INTRAVENOUS | Status: DC | PRN
  Administered 2022-10-01: 40 mg via INTRAVENOUS
  Administered 2022-10-01: 20 mg via INTRAVENOUS

## 2022-10-01 MED ORDER — PHENYLEPHRINE 80 MCG/ML (10ML) SYRINGE FOR IV PUSH (FOR BLOOD PRESSURE SUPPORT)
PREFILLED_SYRINGE | INTRAVENOUS | Status: DC | PRN
  Administered 2022-10-01 (×2): 80 ug via INTRAVENOUS

## 2022-10-01 MED ORDER — SODIUM CHLORIDE 0.9 % IV SOLN
INTRAVENOUS | Status: DC
  Administered 2022-10-01: 20 mL/h via INTRAVENOUS

## 2022-10-01 MED ORDER — PROPOFOL 500 MG/50ML IV EMUL
INTRAVENOUS | Status: DC | PRN
  Administered 2022-10-01: 125 ug/kg/min via INTRAVENOUS

## 2022-10-01 MED ORDER — LIDOCAINE HCL (CARDIAC) PF 100 MG/5ML IV SOSY
PREFILLED_SYRINGE | INTRAVENOUS | Status: DC | PRN
  Administered 2022-10-01: 50 mg via INTRAVENOUS

## 2022-10-01 NOTE — Op Note (Signed)
[facility] Gastroenterology Patient Name: Naveya Ellerman Procedure Date: 10/01/2022 9:03 AM MRN: [id number] Account #: [id number] Date of Birth: 14-Dec-1960 Admit Type: Outpatient Age: 61 Room: [facility] ENDO ROOM 4 Gender: Female Note Status: Finalized Instrument Name: Jasper Riling [id number] Procedure:             Colonoscopy Indications:           Screening for colorectal malignant neoplasm, Last                         colonoscopy 10 years ago Providers:             Lin Landsman MD, MD Referring MD:          Pleas Koch (Referring MD) Medicines:             General Anesthesia Complications:         No immediate complications. Estimated blood loss: None. Procedure:             Pre-Anesthesia Assessment:                        - Prior to the procedure, a History and Physical was                         performed, and patient medications and allergies were                         reviewed. The patient is competent. The risks and                         benefits of the procedure and the sedation options and                         risks were discussed with the patient. All questions                         were answered and informed consent was obtained.                         Patient identification and proposed procedure were                         verified by the physician, the nurse, the                         anesthesiologist, the anesthetist and the technician                         in the pre-procedure area in the procedure room in the                         endoscopy suite. Mental Status Examination: alert and                         oriented. Airway Examination: normal oropharyngeal                         airway and neck mobility. Respiratory Examination:  clear to auscultation. CV Examination: normal.                         Prophylactic Antibiotics: The patient does not require                         prophylactic  antibiotics. Prior Anticoagulants: The                         patient has taken no anticoagulant or antiplatelet                         agents. ASA Grade Assessment: II - A patient with mild                         systemic disease. After reviewing the risks and                         benefits, the patient was deemed in satisfactory                         condition to undergo the procedure. The anesthesia                         plan was to use general anesthesia. Immediately prior                         to administration of medications, the patient was                         re-assessed for adequacy to receive sedatives. The                         heart rate, respiratory rate, oxygen saturations,                         blood pressure, adequacy of pulmonary ventilation, and                         response to care were monitored throughout the                         procedure. The physical status of the patient was                         re-assessed after the procedure.                        After obtaining informed consent, the colonoscope was                         passed under direct vision. Throughout the procedure,                         the patient's blood pressure, pulse, and oxygen                         saturations were monitored continuously. The  Colonoscope was introduced through the anus and                         advanced to the the terminal ileum, with                         identification of the appendiceal orifice and IC                         valve. The colonoscopy was performed without                         difficulty. The patient tolerated the procedure well.                         The quality of the bowel preparation was evaluated                         using the BBPS [facility] Bowel Preparation Scale) with                         scores of: Right Colon = 3, Transverse Colon = 3 and                         Left Colon = 3 (entire  mucosa seen well with no                         residual staining, small fragments of stool or opaque                         liquid). The total BBPS score equals 9. The terminal                         ileum, ileocecal valve, appendiceal orifice, and                         rectum were photographed. Findings:      The perianal and digital rectal examinations were normal. Pertinent       negatives include normal sphincter tone and no palpable rectal lesions.      The terminal ileum appeared normal.      A 9 mm polyp was found in the appendiceal orifice. The polyp was       sessile. Polypectomy was not attempted due to location of the polyp.      A 3 mm polyp was found in the rectum. The polyp was sessile. The polyp       was removed with a cold snare. Resection and retrieval were complete.      The retroflexed view of the distal rectum and anal verge was normal and       showed no anal or rectal abnormalities.      A few diverticula were found in the entire colon. Impression:            - The examined portion of the ileum was normal.                        - One 9 mm polyp at the appendiceal orifice. Resection  not attempted.                        - One 3 mm polyp in the rectum, removed with a cold                         snare. Resected and retrieved.                        - The distal rectum and anal verge are normal on                         retroflexion view.                        - Diverticulosis in the entire examined colon. Recommendation:        - Discharge patient to home (with escort).                        - Resume previous diet today.                        - Continue present medications.                        - Await pathology results.                        - Refer to a surgeon at appointment to be scheduled                         for appendectomy or cecectomy.                        - Repeat colonoscopy in 5 to 7 years for surveillance                          based on pathology results. Procedure Code(s):     --- Professional ---                        [phone], Colonoscopy, flexible; with removal of                         tumor(s), polyp(s), or other lesion(s) by snare                         technique Diagnosis Code(s):     --- Professional ---                        Z12.11, Encounter for screening for malignant neoplasm                         of colon                        D12.1, Benign neoplasm of appendix                        D12.8, Benign neoplasm of rectum  K57.30, Diverticulosis of large intestine without                         perforation or abscess without bleeding CPT copyright 2022 American Medical Association. All rights reserved. The codes documented in this report are preliminary and upon coder review may  be revised to meet current compliance requirements. Dr. Ulyess Mort Lin Landsman MD, MD 10/01/2022 9:28:02 AM This report has been signed electronically. Number of Addenda: 0 Note Initiated On: 10/01/2022 9:03 AM Scope Withdrawal Time: 0 hours 11 minutes 19 seconds  Total Procedure Duration: 0 hours 13 minutes 31 seconds  Estimated Blood Loss:  Estimated blood loss: none.      [facility]

## 2022-10-01 NOTE — Transfer of Care (Signed)
Immediate Anesthesia Transfer of Care Note  Patient: Eshaal J Condon  Procedure(s) Performed: COLONOSCOPY WITH PROPOFOL  Patient Location: PACU  Anesthesia Type:General  Level of Consciousness: awake, alert  and oriented  Airway & Oxygen Therapy: Patient Spontanous Breathing and Patient connected to nasal cannula oxygen  Post-op Assessment: Report given to RN and Post -op Vital signs reviewed and stable  Post vital signs: Reviewed and stable  Last Vitals:  Vitals Value Taken Time  BP 91/38 10/01/22 0926  Temp 36.1 C 10/01/22 0926  Pulse 79 10/01/22 0926  Resp 13 10/01/22 0926  SpO2 99 % 10/01/22 0926    Last Pain:  Vitals:   10/01/22 0926  TempSrc: Temporal  PainSc: Asleep         Complications: No notable events documented.

## 2022-10-01 NOTE — Anesthesia Postprocedure Evaluation (Signed)
Anesthesia Post Note  Patient: Rebecca Macias  Procedure(s) Performed: COLONOSCOPY WITH PROPOFOL  Patient location during evaluation: Endoscopy Anesthesia Type: General Level of consciousness: awake and alert Pain management: pain level controlled Vital Signs Assessment: post-procedure vital signs reviewed and stable Respiratory status: spontaneous breathing, nonlabored ventilation, respiratory function stable and patient connected to nasal cannula oxygen Cardiovascular status: blood pressure returned to baseline and stable Postop Assessment: no apparent nausea or vomiting Anesthetic complications: no   No notable events documented.   Last Vitals:  Vitals:   10/01/22 0936 10/01/22 0946  BP: 101/63 (!) 103/56  Pulse: 86 83  Resp: 19 18  Temp:    SpO2: 98% 100%    Last Pain:  Vitals:   10/01/22 0946  TempSrc:   PainSc: 0-No pain                 Dimas Millin

## 2022-10-01 NOTE — H&P (Signed)
Cephas Darby, MD [address]  [place]  [place], [place] [address]  Main: [phone]  Fax: [phone] Pager: [phone]  Primary Care Physician:  Pleas Koch, NP Primary Gastroenterologist:  Dr. Cephas Darby  Pre-Procedure History & Physical: HPI:  Rebecca Macias is a 61 y.o. female is here for an colonoscopy.   Past Medical History:  Diagnosis Date   H/O gestational diabetes mellitus, not currently pregnant    Ovarian cyst    Palpitations    Sigmoid diverticulitis 08/2012   Subclinical hypothyroidism    Type 2 diabetes mellitus ([place])     Past Surgical History:  Procedure Laterality Date   BLADDER SUSPENSION  2009   SMALL INTESTINE SURGERY     TUBAL LIGATION      Prior to Admission medications   Medication Sig Start Date End Date Taking? Authorizing Provider  atenolol (TENORMIN) 25 MG tablet TAKE 1 TABLET (25 MG TOTAL) BY MOUTH DAILY. 04/14/22  Yes Philemon Kingdom, MD  Biotin 1000 MCG tablet Take 1,000 mcg by mouth daily.   Yes [provider]  blood glucose meter kit and supplies KIT Dispense based on patient and insurance preference. Use up to four times daily as directed. (FOR ICD-9 250.00, 250.01). 04/24/22  Yes Pleas Koch, NP  cyanocobalamin 500 MCG tablet Take 1,000 mcg by mouth daily. Reported on 06/12/2016   Yes [provider]  docusate sodium (COLACE) 100 MG capsule Take 200 mg by mouth daily. Reported on 06/12/2016   Yes [provider]  meloxicam (MOBIC) 15 MG tablet Take 1 tablet by mouth daily. 06/27/22  Yes [provider]  metFORMIN (GLUCOPHAGE) 500 MG tablet TAKE 1 TABLET BY MOUTH 2 TIMES DAILY WITH A MEAL. 07/10/22  Yes Philemon Kingdom, MD  rosuvastatin (CRESTOR) 5 MG tablet TAKE 1 TABLET (5 MG TOTAL) BY MOUTH EVERY EVENING. FOR CHOLESTEROL. 09/25/22  Yes Pleas Koch, NP  Semaglutide,0.25 or 0.5MG/DOS, (OZEMPIC, 0.25 OR 0.5 MG/DOSE,) 2 MG/3ML SOPN Inject 0.25 mg into the skin once weekly x  4 weeks, then increase to 0.5 mg weekly thereafter for diabetes. 09/01/22  Yes Pleas Koch, NP    Allergies as of 09/02/2022 - Review Complete 09/02/2022  Allergen Reaction Noted   Flagyl [metronidazole] Itching 05/05/2014    Family History  Problem Relation Age of Onset   Heart disease Father    Kidney disease Father    Diabetes Father    Diabetes Maternal Grandmother    Diabetes Paternal Grandmother    Breast cancer Neg Hx     Social History   Socioeconomic History   Marital status: Divorced    Spouse name: Not on file   Number of children: Not on file   Years of education: Not on file   Highest education level: Not on file  Occupational History   Not on file  Tobacco Use   Smoking status: Former    Types: Cigarettes, Cigars    Quit date: 09/19/2011    Years since quitting: 11.0   Smokeless tobacco: Never  Vaping Use   Vaping Use: Former  Substance and Sexual Activity   Alcohol use: No   Drug use: No   Sexual activity: Not on file  Other Topics Concern   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   Social Determinants of Health   Financial Resource Strain: Not on file  Food Insecurity: Not on file  Transportation Needs: Not on file  Physical Activity: Not on file  Stress: Not on file  Social Connections: Not on file  Intimate Partner Violence: Not on file    Review of Systems: See HPI, otherwise negative ROS  Physical Exam: BP 138/81   Pulse 99   Temp (!) 96 F (35.6 C) (Temporal)   Resp 20   Ht '5\' 5"'  (1.651 m)   Wt 93 kg   LMP 11/03/2012   SpO2 98%   BMI 34.11 kg/m  General:   Alert,  pleasant and cooperative in NAD Head:  Normocephalic and atraumatic. Neck:  Supple; no masses or thyromegaly. Lungs:  Clear throughout to auscultation.    Heart:  Regular rate and rhythm. Abdomen:  Soft, nontender and nondistended. Normal bowel sounds, without  guarding, and without rebound.   Neurologic:  Alert and  oriented x4;  grossly normal neurologically.  Impression/Plan: Kenslei J Travelstead is here for an colonoscopy to be performed for colon cancer screening  Risks, benefits, limitations, and alternatives regarding  colonoscopy have been reviewed with the patient.  Questions have been answered.  All parties agreeable.   Sherri Sear, MD  10/01/2022, 8:31 AM

## 2022-10-01 NOTE — Anesthesia Preprocedure Evaluation (Signed)
Anesthesia Evaluation  Patient identified by MRN, date of birth, ID band Patient awake    Reviewed: Allergy & Precautions, NPO status , Patient's Chart, lab work & pertinent test results  History of Anesthesia Complications Negative for: history of anesthetic complications  Airway Mallampati: III  TM Distance: >3 FB Neck ROM: full    Dental  (+) Chipped, Dental Advidsory Given   Pulmonary neg pulmonary ROS, neg shortness of breath, neg COPD, former smoker,    Pulmonary exam normal        Cardiovascular (-) angina(-) Past MI and (-) CABG negative cardio ROS Normal cardiovascular exam     Neuro/Psych negative neurological ROS  negative psych ROS   GI/Hepatic Neg liver ROS, GERD  ,  Endo/Other  diabetesHypothyroidism   Renal/GU negative Renal ROS  negative genitourinary   Musculoskeletal   Abdominal   Peds  Hematology negative hematology ROS (+)   Anesthesia Other Findings Past Medical History: No date: H/O gestational diabetes mellitus, not currently pregnant No date: Ovarian cyst No date: Palpitations 08/2012: Sigmoid diverticulitis No date: Subclinical hypothyroidism No date: Type 2 diabetes mellitus ([place])  Past Surgical History: 2009: BLADDER SUSPENSION No date: SMALL INTESTINE SURGERY No date: TUBAL LIGATION  BMI    Body Mass Index: 34.11 kg/m      Reproductive/Obstetrics negative OB ROS                             Anesthesia Physical Anesthesia Plan  ASA: 2  Anesthesia Plan: General   Post-op Pain Management: Minimal or no pain anticipated   Induction: Intravenous  PONV Risk Score and Plan: 3 and Propofol infusion, TIVA and Ondansetron  Airway Management Planned: Nasal Cannula  Additional Equipment: None  Intra-op Plan:   Post-operative Plan:   Informed Consent: I have reviewed the patients History and Physical, chart, labs and discussed the procedure  including the risks, benefits and alternatives for the proposed anesthesia with the patient or authorized representative who has indicated his/her understanding and acceptance.     Dental advisory given  Plan Discussed with: CRNA and Surgeon  Anesthesia Plan Comments: (Discussed risks of anesthesia with patient, including possibility of difficulty with spontaneous ventilation under anesthesia necessitating airway intervention, PONV, and rare risks such as cardiac or respiratory or neurological events, and allergic reactions. Discussed the role of CRNA in patient's perioperative care. Patient understands.)        Anesthesia Quick Evaluation

## 2022-10-01 NOTE — Telephone Encounter (Signed)
Per Dr. Marius Ditch: Please refer her to general surgery, Dx: unresectable appendiceal orifice polyp  Placed referral to general surgery

## 2022-10-02 ENCOUNTER — Encounter: Payer: Self-pay | Admitting: Gastroenterology

## 2022-10-02 LAB — SURGICAL PATHOLOGY

## 2022-10-08 ENCOUNTER — Encounter: Payer: Self-pay | Admitting: Gastroenterology

## 2022-10-14 ENCOUNTER — Ambulatory Visit: Payer: Commercial Managed Care - PPO | Admitting: Surgery

## 2022-10-14 ENCOUNTER — Encounter: Payer: Self-pay | Admitting: Surgery

## 2022-10-14 VITALS — BP 144/83 | HR 91 | Temp 98.4°F | Ht 65.0 in | Wt 205.4 lb

## 2022-10-14 DIAGNOSIS — D121 Benign neoplasm of appendix: Secondary | ICD-10-CM

## 2022-10-14 DIAGNOSIS — R1011 Right upper quadrant pain: Secondary | ICD-10-CM | POA: Diagnosis not present

## 2022-10-14 NOTE — Progress Notes (Signed)
Patient ID: Rebecca Macias, female   DOB: October 08, 1961, 61 y.o.   MRN: [id number]  Chief Complaint: Appendiceal orifice polyp  History of Present Illness Rebecca Macias is a 61 y.o. female with findings on a screening colonoscopy, revealing a polyp protruding from the appendiceal orifice.  Another rectal polyp was noted to be hyperplastic after biopsy.  She otherwise denies any history of hematochezia.  She has had a prior sigmoid resection for diverticulitis, converted to open due to the reported size of her uterus.  Colonoscopy findings: A 9 mm polyp was found in the appendiceal orifice. The polyp was sessile. Polypectomy was not attempted due to location of the polyp. Cecum : Single Polyp A 3 mm polyp was found in the rectum. The polyp was sessile. The polyp was removed with a cold snare. Resection and retrieval were complete. The retroflexed view of the distal rectum and anal verge was normal and showed no anal or rectal abnormalities. A few diverticula were found in the entire colon. - The examined portion of the ileum was normal. - One 9 mm polyp at the appendiceal orifice. Resection not attempted. - One 3 mm polyp in the rectum, removed with a cold snare. Resected and retrieved. - The distal rectum and anal verge are normal on retroflexion view. - Diverticulosis in the entire examined colon. Impression: - Discharge patient to home (with escort). - Resume previous diet today. - Continue present medications. - Await pathology results. - Refer to a surgeon at appointment to be scheduled for appendectomy or cecectomy. - Repeat colonoscopy in 5 to 7 years for surveillance based on pathology results.   Past Medical History Past Medical History:  Diagnosis Date   H/O gestational diabetes mellitus, not currently pregnant    Ovarian cyst    Palpitations    Sigmoid diverticulitis 08/2012   Subclinical hypothyroidism    Type 2 diabetes mellitus ([place])       Past Surgical History:   Procedure Laterality Date   BLADDER SUSPENSION  2009   Colectomy Left    likely sigmoid, open.  for diverticulitis.   COLONOSCOPY WITH PROPOFOL N/A 10/01/2022   Procedure: COLONOSCOPY WITH PROPOFOL;  Surgeon: Lin Landsman, MD;  Location: [facility] ENDOSCOPY;  Service: Gastroenterology;  Laterality: N/A;   SMALL INTESTINE SURGERY     TUBAL LIGATION      Allergies  Allergen Reactions   Flagyl [Metronidazole] Itching    Current Outpatient Medications  Medication Sig Dispense Refill   atenolol (TENORMIN) 25 MG tablet TAKE 1 TABLET (25 MG TOTAL) BY MOUTH DAILY. 90 tablet 1   Biotin 1000 MCG tablet Take 1,000 mcg by mouth daily.     blood glucose meter kit and supplies KIT Dispense based on patient and insurance preference. Use up to four times daily as directed. (FOR ICD-9 250.00, 250.01). 1 each 0   cyanocobalamin 500 MCG tablet Take 1,000 mcg by mouth daily. Reported on 06/12/2016     docusate sodium (COLACE) 100 MG capsule Take 200 mg by mouth daily. Reported on 06/12/2016     metFORMIN (GLUCOPHAGE) 500 MG tablet TAKE 1 TABLET BY MOUTH 2 TIMES DAILY WITH A MEAL. 180 tablet 1   rosuvastatin (CRESTOR) 5 MG tablet TAKE 1 TABLET (5 MG TOTAL) BY MOUTH EVERY EVENING. FOR CHOLESTEROL. 90 tablet 2   Semaglutide,0.25 or 0.5MG/DOS, (OZEMPIC, 0.25 OR 0.5 MG/DOSE,) 2 MG/3ML SOPN Inject 0.25 mg into the skin once weekly x 4 weeks, then increase to 0.5 mg weekly thereafter for diabetes.  9 mL 0   No current facility-administered medications for this visit.    Family History Family History  Problem Relation Age of Onset   Heart disease Father    Kidney disease Father    Diabetes Father    Diabetes Maternal Grandmother    Diabetes Paternal Grandmother    Breast cancer Neg Hx       Social History Social History   Tobacco Use   Smoking status: Former    Types: Cigarettes, Cigars    Quit date: 09/19/2011    Years since quitting: 11.0   Smokeless tobacco: Never  Vaping Use   Vaping Use:  Former  Substance Use Topics   Alcohol use: No   Drug use: No        Review of Systems  Constitutional: Negative.   HENT: Negative.    Eyes: Negative.   Respiratory: Negative.    Cardiovascular: Negative.   Gastrointestinal:  Positive for constipation.  Genitourinary: Negative.   Skin: Negative.   Neurological: Negative.   Psychiatric/Behavioral: Negative.        Physical Exam Blood pressure (!) 144/83, pulse 91, temperature 98.4 F (36.9 C), temperature source Oral, height _0  (1.651 m), weight 205 lb 6.4 oz (93.2 kg), last menstrual period 11/03/2012, SpO2 97 %. Last Weight  Most recent update: 10/14/2022  9:57 AM    Weight  93.2 kg (205 lb 6.4 oz)             CONSTITUTIONAL: Well developed, and nourished, appropriately responsive and aware without distress.   EYES: Sclera non-icteric.   EARS, NOSE, MOUTH AND THROAT:  The oropharynx is clear. Oral mucosa is pink and moist.   Hearing is intact to voice.  NECK: Trachea is midline, and there is no jugular venous distension.  LYMPH NODES:  Lymph nodes in the neck are not enlarged. RESPIRATORY:  Lungs are clear, and breath sounds are equal bilaterally. Normal respiratory effort without pathologic use of accessory muscles. CARDIOVASCULAR: Heart is regular in rate and rhythm. GI: The abdomen is notable for midline infraumbilical scar, otherwise soft, nontender, and nondistended. There were no palpable masses. I did not appreciate hepatosplenomegaly. There were normal bowel sounds. MUSCULOSKELETAL:  Symmetrical muscle tone appreciated in all four extremities.    SKIN: Skin turgor is normal. No pathologic skin lesions appreciated.  NEUROLOGIC:  Motor and sensation appear grossly normal.  Cranial nerves are grossly without defect. PSYCH:  Alert and oriented to person, place and time. Affect is appropriate for situation.  Data Reviewed I have personally reviewed what is currently available of the patient's imaging, recent labs  and medical records.   Labs:     Latest Ref Rng & Units 08/28/2022    8:26 AM 08/29/2021    8:46 AM 11/10/2019   12:19 PM  CBC  WBC 4.0 - 10.5 K/uL 4.0  4.2  6.8   Hemoglobin 12.0 - 15.0 g/dL 13.3  13.6  13.7   Hematocrit 36.0 - 46.0 % 39.5  40.8  39.9   Platelets 150.0 - 400.0 K/uL 272.0  275.0  322.0       Latest Ref Rng & Units 08/28/2022    8:26 AM 08/29/2021    8:46 AM 09/03/2020    8:01 AM  CMP  Glucose 70 - 99 mg/dL 187  160  171   BUN 6 - 23 mg/dL _1 Creatinine 0.40 - 1.20 mg/dL 0.67  0.71  0.70   Sodium 135 -  145 mEq/L 140  143  141   Potassium 3.5 - 5.1 mEq/L 4.2  4.7  4.3   Chloride 96 - 112 mEq/L 107  108  107   CO2 19 - 32 mEq/L _0 Calcium 8.4 - 10.5 mg/dL 9.5  9.6  9.3   Total Protein 6.0 - 8.3 g/dL 6.8  7.6  7.0   Total Bilirubin 0.2 - 1.2 mg/dL 0.7  0.5  0.3   Alkaline Phos 39 - 117 U/L 89  73  87   AST 0 - 37 U/L 34  22  25   ALT 0 - 35 U/L _1 Imaging: Radiological review:  Reviewed CT report from 2014.  Within last 24 hrs: No results found.  Assessment    Appendiceal polyp. Patient Active Problem List   Diagnosis Date Noted   Encounter for screening colonoscopy    Rectal polyp    Adenoma of appendix    Chronic low back pain with left-sided sciatica 10/10/2021   Non-insulin dependent type 2 diabetes mellitus ([place]) 07/10/2016   GERD (gastroesophageal reflux disease) 07/02/2016   Subclinical hyperthyroidism 06/12/2016   Palpitations 05/08/2015   Insomnia 05/08/2015   Symptoms, such as flushing, sleeplessness, headache, lack of concentration, associated with the menopause 05/08/2015   Thyroid nodule, cold 02/12/2015   Routine general medical examination at a health care facility 04/20/2014   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  Risks of anesthesia, bleeding, infection discussed with patient.  She understands  these risks are not all exclusive.  Questions asked, reviewed surgical detail.  No guarantees ever expressed or implied.  She would like to defer surgery until after the holidays.  Face-to-face time spent with the patient and accompanying care providers(if present) was 30 minutes, with more than 50% of the time spent counseling, educating, and coordinating care of the patient.    These notes generated with voice recognition software. I apologize for typographical errors.  Ronny Bacon M.D., FACS 10/14/2022, 10:20 AM

## 2022-10-14 NOTE — Patient Instructions (Addendum)
Your CT is scheduled for 10/24/2022 at 9 am (arrive by 8:30 am) at [facility]. Nothing to eat or drink 4 hours prior.   If you have any concerns or questions, please feel free to call our  office. See follow up appointment below.   Laparoscopic Appendectomy, Adult  A laparoscopic appendectomy is a surgery to take out the appendix. The appendix is a finger-like structure that is attached to the large intestine. This procedure may be done to prevent an inflamed appendix from bursting (rupturing). It may also be done to treat the infection from an appendix that has ruptured. It is often done right after appendicitis is diagnosed. Appendicitis is inflammation of the appendix. In this surgery, your health care provider uses a thin, lighted tube with a camera (laparoscope) to take out the appendix through three small incisions. This is a minimally invasive surgery. It usually results in less pain, fewer problems, and a quicker recovery than surgery done through a large incision (open appendectomy). Tell a health care provider about: Any allergies you have. All medicines you are taking, including vitamins, herbs, eye drops, creams, and over-the-counter medicines. Any steroid use. This includes creams or steroids you take by mouth. Any problems you or family members have had with anesthetic medicines. Any bleeding problems you have. Any surgeries you have had. Any medical conditions you have. Whether you are pregnant or may be pregnant. What are the risks? Generally, this is a safe procedure. However, problems may occur, including: Infection. Bleeding. Damage to nearby structures or organs. Allergic reactions to medicines. A collection of pus (abscess). Blood clots in the legs. What happens before the procedure? When to stop eating and drinking Follow instructions from your health care provider about eating and drinking restrictions. You may be asked not to eat or drink as soon as the  diagnosis of appendicitis is made. Medicines Ask your health care provider about: Changing or stopping your regular medicines. This is especially important if you are taking diabetes medicines or blood thinners. Taking medicines such as aspirin and ibuprofen. These medicines can thin your blood. Do not take these medicines unless your health care provider tells you to take them. Taking over-the-counter medicines, vitamins, herbs, and supplements. General instructions If you will be going home right after the procedure, plan to have a responsible adult: Take you home from the hospital or clinic. You will not be allowed to drive. Care for you for the time you are told. Ask your health care provider: How your surgery site will be marked. What steps will be taken to help prevent infection. These steps may include: Removing hair at the surgery site. Washing skin with a germ-killing soap. Taking antibiotic medicine. What happens during the procedure?  An IV will be inserted into one of your veins. You will be given one or more of the following: A medicine to help you relax (sedative). A medicine to numb the area (local anesthetic). A medicine to make you fall asleep (general anesthetic). A thin, flexible tube (catheter) may be put into your bladder to drain urine. A tube may be passed through your nose or mouth and into your stomach (orogastric or nasogastric tube) to drain any stomach contents. Your surgeon will make three small incisions near your belly button (navel). A gas (carbon dioxide) will be used to fill your abdomen. The gas will make your abdomen expand. This helps the surgeon see clearly and gives him or her more room to work. A laparoscope will be  passed through one of the incisions. Other surgical instruments will be passed through the other incisions to assist in surgery. The appendix will be located and removed through one of the incisions. The abdomen may be washed out to  remove bacteria. The incisions will be closed with stitches (sutures), staples, or adhesive strips. A bandage (dressing) may be used to cover the incisions. If a tube was inserted into your bladder or stomach, it will be removed. The procedure may vary among health care providers and hospitals. What happens after the procedure? Your blood pressure, heart rate, breathing rate, and blood oxygen level will be monitored until you leave the hospital or clinic. You will be given medicines as needed to control pain and infection. If you were given a sedative during the procedure, it can affect you for several hours. Do not drive or operate machinery until your health care provider says that it is safe. If your appendix did not rupture, you may be able to go home the same day after your surgery. If your appendix ruptured: You will get antibiotic medicine through an IV line. You may be sent home with a temporary drain. Summary A laparoscopic appendectomy is a surgery to take out the appendix. The appendix is removed through three small incisions with the help of a thin, lighted tube that has a camera (laparoscope). This is a safe procedure, but there are some risks. Risks include bleeding, infection, allergic reaction to medicines, or damage to nearby organs. After the procedure, your blood pressure, heart rate, breathing rate, and blood oxygen level will be monitored until you leave the hospital or clinic. You will be given medicines as needed to control pain and infection. This information is not intended to replace advice given to you by your health care provider. Make sure you discuss any questions you have with your health care provider. Document Revised: 09/05/2021 Document Reviewed: 09/05/2021 Elsevier Patient Education  [place].

## 2022-10-20 ENCOUNTER — Other Ambulatory Visit: Payer: Self-pay | Admitting: Internal Medicine

## 2022-10-21 DIAGNOSIS — R002 Palpitations: Secondary | ICD-10-CM

## 2022-10-21 MED ORDER — ATENOLOL 25 MG PO TABS: 25.0000 mg | ORAL_TABLET | Freq: Every day | ORAL | 2 refills | Status: DC

## 2022-10-24 ENCOUNTER — Ambulatory Visit
Admission: RE | Admit: 2022-10-24 | Discharge: 2022-10-24 | Disposition: A | Discharge status: Home / Self Care | Payer: Commercial Managed Care - PPO | Source: Ambulatory Visit | Attending: Surgery | Admitting: Surgery

## 2022-10-24 DIAGNOSIS — R1011 Right upper quadrant pain: Secondary | ICD-10-CM | POA: Diagnosis present

## 2022-10-24 LAB — POCT I-STAT CREATININE: Creatinine, Ser: 0.6 mg/dL (ref 0.44–1.00)

## 2022-10-24 MED ORDER — IOHEXOL 300 MG/ML  SOLN
100.0000 mL | Freq: Once | INTRAMUSCULAR | Status: AC | PRN
  Administered 2022-10-24: 100 mL via INTRAVENOUS

## 2022-11-27 ENCOUNTER — Ambulatory Visit (INDEPENDENT_AMBULATORY_CARE_PROVIDER_SITE_OTHER): Payer: Commercial Managed Care - PPO | Admitting: Surgery

## 2022-11-27 ENCOUNTER — Ambulatory Visit: Payer: Self-pay | Admitting: Surgery

## 2022-11-27 DIAGNOSIS — D121 Benign neoplasm of appendix: Secondary | ICD-10-CM

## 2022-11-27 NOTE — Progress Notes (Addendum)
Patient ID: Rebecca Macias, female   DOB: 07-18-1961, 61 y.o.   MRN: [id number]  She was at work, and I am in the office.  10 minutes were spent on the phone together.   I reviewed the prior report with the additonal review of the  prior colonoscopy, and the latest CT imaging.    Obviously the exam below is based on the last visit.  Rebecca Macias reports she remains in good health.   Chief Complaint: Appendiceal orifice polyp  History of Present Illness Rebecca Macias is a 61 y.o. female with findings on a screening colonoscopy, revealing a polyp protruding from the appendiceal orifice.  Another rectal polyp was noted to be hyperplastic after biopsy.  She otherwise denies any history of hematochezia.  She has had a prior sigmoid resection for diverticulitis, converted to open due to the reported size of her uterus.  Colonoscopy findings: A 9 mm polyp was found in the appendiceal orifice. The polyp was sessile. Polypectomy was not attempted due to location of the polyp. Cecum : Single Polyp A 3 mm polyp was found in the rectum. The polyp was sessile. The polyp was removed with a cold snare. Resection and retrieval were complete. The retroflexed view of the distal rectum and anal verge was normal and showed no anal or rectal abnormalities. A few diverticula were found in the entire colon. - The examined portion of the ileum was normal. - One 9 mm polyp at the appendiceal orifice. Resection not attempted. - One 3 mm polyp in the rectum, removed with a cold snare. Resected and retrieved. - The distal rectum and anal verge are normal on retroflexion view. - Diverticulosis in the entire examined colon. Impression: - Discharge patient to home (with escort). - Resume previous diet today. - Continue present medications. - Await pathology results. - Refer to a surgeon at appointment to be scheduled for appendectomy or cecectomy. - Repeat colonoscopy in 5 to 7 years for surveillance based on  pathology results.   Past Medical History Past Medical History:  Diagnosis Date   H/O gestational diabetes mellitus, not currently pregnant    Ovarian cyst    Palpitations    Sigmoid diverticulitis 08/2012   Subclinical hypothyroidism    Type 2 diabetes mellitus ([place])       Past Surgical History:  Procedure Laterality Date   BLADDER SUSPENSION  2009   Colectomy Left    likely sigmoid, open.  for diverticulitis.   COLONOSCOPY WITH PROPOFOL N/A 10/01/2022   Procedure: COLONOSCOPY WITH PROPOFOL;  Surgeon: Lin Landsman, MD;  Location: [facility] ENDOSCOPY;  Service: Gastroenterology;  Laterality: N/A;   SMALL INTESTINE SURGERY     TUBAL LIGATION      Allergies  Allergen Reactions   Flagyl [Metronidazole] Itching    Current Outpatient Medications  Medication Sig Dispense Refill   atenolol (TENORMIN) 25 MG tablet Take 1 tablet (25 mg total) by mouth daily. For palpitations. 90 tablet 2   Biotin 1000 MCG tablet Take 1,000 mcg by mouth daily.     blood glucose meter kit and supplies KIT Dispense based on patient and insurance preference. Use up to four times daily as directed. (FOR ICD-9 250.00, 250.01). 1 each 0   cyanocobalamin 500 MCG tablet Take 1,000 mcg by mouth daily. Reported on 06/12/2016     docusate sodium (COLACE) 100 MG capsule Take 200 mg by mouth daily. Reported on 06/12/2016     metFORMIN (GLUCOPHAGE) 500 MG tablet TAKE 1 TABLET  BY MOUTH 2 TIMES DAILY WITH A MEAL. 180 tablet 1   rosuvastatin (CRESTOR) 5 MG tablet TAKE 1 TABLET (5 MG TOTAL) BY MOUTH EVERY EVENING. FOR CHOLESTEROL. 90 tablet 2   Semaglutide,0.25 or 0.5MG/DOS, (OZEMPIC, 0.25 OR 0.5 MG/DOSE,) 2 MG/3ML SOPN Inject 0.25 mg into the skin once weekly x 4 weeks, then increase to 0.5 mg weekly thereafter for diabetes. 9 mL 0   No current facility-administered medications for this visit.    Family History Family History  Problem Relation Age of Onset   Heart disease Father    Kidney disease Father     Diabetes Father    Diabetes Maternal Grandmother    Diabetes Paternal Grandmother    Breast cancer Neg Hx       Social History Social History   Tobacco Use   Smoking status: Former    Types: Cigarettes, Cigars    Quit date: 09/19/2011    Years since quitting: 11.1   Smokeless tobacco: Never  Vaping Use   Vaping Use: Former  Substance Use Topics   Alcohol use: No   Drug use: No        Review of Systems  Constitutional: Negative.   HENT: Negative.    Eyes: Negative.   Respiratory: Negative.    Cardiovascular: Negative.   Gastrointestinal:  Positive for constipation.  Genitourinary: Negative.   Skin: Negative.   Neurological: Negative.   Psychiatric/Behavioral: Negative.        Physical Exam Last menstrual period 11/03/2012.    CONSTITUTIONAL: Well developed, and nourished, appropriately responsive and aware without distress.   EYES: Sclera non-icteric.   EARS, NOSE, MOUTH AND THROAT:  The oropharynx is clear. Oral mucosa is pink and moist.   Hearing is intact to voice.  NECK: Trachea is midline, and there is no jugular venous distension.  LYMPH NODES:  Lymph nodes in the neck are not enlarged. RESPIRATORY:  Lungs are clear, and breath sounds are equal bilaterally. Normal respiratory effort without pathologic use of accessory muscles. CARDIOVASCULAR: Heart is regular in rate and rhythm. GI: The abdomen is notable for midline infraumbilical scar, otherwise soft, nontender, and nondistended. There were no palpable masses. I did not appreciate hepatosplenomegaly. There were normal bowel sounds. MUSCULOSKELETAL:  Symmetrical muscle tone appreciated in all four extremities.    SKIN: Skin turgor is normal. No pathologic skin lesions appreciated.  NEUROLOGIC:  Motor and sensation appear grossly normal.  Cranial nerves are grossly without defect. PSYCH:  Alert and oriented to person, place and time. Affect is appropriate for situation.  Data Reviewed I have personally  reviewed what is currently available of the patient's imaging, recent labs and medical records.   Labs:     Latest Ref Rng & Units 08/28/2022    8:26 AM 08/29/2021    8:46 AM 11/10/2019   12:19 PM  CBC  WBC 4.0 - 10.5 K/uL 4.0  4.2  6.8   Hemoglobin 12.0 - 15.0 g/dL 13.3  13.6  13.7   Hematocrit 36.0 - 46.0 % 39.5  40.8  39.9   Platelets 150.0 - 400.0 K/uL 272.0  275.0  322.0       Latest Ref Rng & Units 10/24/2022    9:20 AM 08/28/2022    8:26 AM 08/29/2021    8:46 AM  CMP  Glucose 70 - 99 mg/dL  187  160   BUN 6 - 23 mg/dL  12  11   Creatinine 0.44 - 1.00 mg/dL 0.60  0.67  0.71  Sodium 135 - 145 mEq/L  140  143   Potassium 3.5 - 5.1 mEq/L  4.2  4.7   Chloride 96 - 112 mEq/L  107  108   CO2 19 - 32 mEq/L  25  28   Calcium 8.4 - 10.5 mg/dL  9.5  9.6   Total Protein 6.0 - 8.3 g/dL  6.8  7.6   Total Bilirubin 0.2 - 1.2 mg/dL  0.7  0.5   Alkaline Phos 39 - 117 U/L  89  73   AST 0 - 37 U/L  34  22   ALT 0 - 35 U/L  27  19     SURGICAL PATHOLOGY CASE: [id number] PATIENT: Alaiya Elrod Surgical Pathology Report  Specimen Submitted: A. Rectum polyp; cold snare  Clinical History: Screening colonoscopy.  Diverticulosis, appendiceal orifice polyp, rectum polyp  DIAGNOSIS: A. RECTAL POLYP; COLD SNARE: - HYPERPLASTIC POLYP. - NEGATIVE FOR DYSPLASIA AND MALIGNANCY.    Imaging: Radiological review:  CLINICAL DATA:  Abdominal pain.  Scheduled for appendectomy.   EXAM: CT ABDOMEN AND PELVIS WITH CONTRAST   TECHNIQUE: Multidetector CT imaging of the abdomen and pelvis was performed using the standard protocol following bolus administration of intravenous contrast.   RADIATION DOSE REDUCTION: This exam was performed according to the departmental dose-optimization program which includes automated exposure control, adjustment of the mA and/or kV according to patient size and/or use of iterative reconstruction technique.   CONTRAST:  163m OMNIPAQUE IOHEXOL 300 MG/ML   SOLN   COMPARISON:  CT abdomen and pelvis 10/20/2013   FINDINGS: Lower chest: No acute abnormality.   Hepatobiliary: No focal liver abnormality is seen. No gallstones, gallbladder wall thickening, or biliary dilatation.   Pancreas: Unremarkable. No pancreatic ductal dilatation or surrounding inflammatory changes.   Spleen: Normal in size without focal abnormality.   Adrenals/Urinary Tract: Adrenal glands are unremarkable. Kidneys are normal, without renal calculi, focal lesion, or hydronephrosis. Bladder is unremarkable.   Stomach/Bowel: Stomach is within normal limits. Appendix appears normal. No evidence of bowel wall thickening, distention, or inflammatory changes. There are scattered colonic diverticula. Rectosigmoid anastomosis is present.   Vascular/Lymphatic: There are atherosclerotic calcifications of the aorta. Aorta and IVC are normal in size. Again seen are splenic varices splenorenal shunt. No enlarged lymph nodes are identified.   Reproductive: Uterus and bilateral adnexa are unremarkable.   Other: No abdominal wall hernia or abnormality. No abdominopelvic ascites. Midline abdominal scar is present.   Musculoskeletal: Mild degenerative changes affect the spine.   IMPRESSION: 1. No acute localizing process in the abdomen or pelvis. 2. Colonic diverticulosis without evidence for diverticulitis. 3. Stable splenic varices with splenorenal shunt.   Aortic Atherosclerosis (ICD10-I70.0).     Electronically Signed   By: ARonney AstersM.D.   On: 10/25/2022 23:56   Assessment    Appendiceal polyp. Patient Active Problem List   Diagnosis Date Noted   Encounter for screening colonoscopy    Hyperplastic rectal polyp    Adenoma of appendix    Chronic low back pain with left-sided sciatica 10/10/2021   Non-insulin dependent type 2 diabetes mellitus ([place] 07/10/2016   GERD (gastroesophageal reflux disease) 07/02/2016   Subclinical hyperthyroidism 06/12/2016    Palpitations 05/08/2015   Insomnia 05/08/2015   Symptoms, such as flushing, sleeplessness, headache, lack of concentration, associated with the menopause 05/08/2015   Thyroid nodule, cold 02/12/2015   Routine general medical examination at a health care facility 04/20/2014   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  Setting date for December 29, 2022  Prior discussion: Risks of anesthesia, bleeding, infection discussed with patient.  She understands these risks are not all exclusive.  Questions asked, reviewed surgical detail.  No guarantees ever expressed or implied.  She would like to defer surgery until after the holidays.    These notes generated with voice recognition software. I apologize for typographical errors.  Ronny Bacon M.D., FACS 11/27/2022, 9:14 AM

## 2022-11-28 ENCOUNTER — Telehealth: Payer: Self-pay | Admitting: Surgery

## 2022-11-28 NOTE — Telephone Encounter (Signed)
Left message for patient to call, please inform her of the following regarding scheduled surgery with Dr. Christian Mate.  ALSO, please verify health insurance for 2024.   Pre-Admission date/time, and Surgery date at [facility].  Surgery Date: 12/29/22 Preadmission Testing Date: 12/22/22 (phone 8a-1p)  Also patient will need to call at [phone], between 1-3:00pm the day before surgery, to find out what time to arrive for surgery.

## 2022-12-09 NOTE — Telephone Encounter (Signed)
Patient calls back, she is now informed of all dates regarding her surgery.

## 2022-12-09 NOTE — Telephone Encounter (Signed)
Another message is left for patient to call.  Need to provide the following regarding her scheduled surgery.

## 2022-12-18 ENCOUNTER — Ambulatory Visit: Payer: Commercial Managed Care - PPO | Admitting: Primary Care

## 2022-12-18 ENCOUNTER — Encounter: Payer: Self-pay | Admitting: Primary Care

## 2022-12-18 ENCOUNTER — Other Ambulatory Visit: Payer: Self-pay | Admitting: Primary Care

## 2022-12-18 VITALS — BP 132/76 | HR 77 | Temp 98.4°F | Ht 65.0 in | Wt 203.0 lb

## 2022-12-18 DIAGNOSIS — E119 Type 2 diabetes mellitus without complications: Secondary | ICD-10-CM

## 2022-12-18 DIAGNOSIS — J019 Acute sinusitis, unspecified: Secondary | ICD-10-CM | POA: Diagnosis not present

## 2022-12-18 LAB — POCT GLYCOSYLATED HEMOGLOBIN (HGB A1C): Hemoglobin A1C: 6.2 % — AB (ref 4.0–5.6)

## 2022-12-18 MED ORDER — OZEMPIC (0.25 OR 0.5 MG/DOSE) 2 MG/3ML ~~LOC~~ SOPN: 0.5000 mg | PEN_INJECTOR | SUBCUTANEOUS | 1 refills | Status: DC

## 2022-12-18 MED ORDER — AMOXICILLIN-POT CLAVULANATE 875-125 MG PO TABS: 1.0000 | ORAL_TABLET | Freq: Two times a day (BID) | ORAL | 0 refills | Status: DC

## 2022-12-18 NOTE — Progress Notes (Signed)
Subjective:    Patient ID: Rebecca Macias, female    DOB: February 24, 1961, 62 y.o.   MRN: [id number]  HPI  Rebecca LITTS is a very pleasant 62 y.o. female  has a past medical history of H/O gestational diabetes mellitus, not currently pregnant, Ovarian cyst, Palpitations, Sigmoid diverticulitis (08/2012), Subclinical hypothyroidism, and Type 2 diabetes mellitus ([place]). who presents today for follow up of diabetes. She would also like to discuss sinus pressure.  1) Type 2 Diabetes:  Current medications include: metformin 500 mg BID, Ozempic 0.5 mg weekly.  She denies nausea, but she has experienced constipation so she's tried laxative and stool softener.   She is checking her blood glucose 3-4 times weekly and is getting readings of 130's to 150's.   Last A1C: 8.3 in September 2023, 6.2 today. Last Eye Exam: Due Last Foot Exam: Due Pneumonia Vaccination: 2018 Urine Microalbumin: UTD Statin: rosuvastatin   Dietary changes since last visit: increased protein, limiting sweets and carbs   Exercise: Some. Recently purchased a treadmill.   BP Readings from Last 3 Encounters:  12/18/22 132/76  10/14/22 (!) 144/83  10/01/22 (!) 103/56   Wt Readings from Last 3 Encounters:  12/18/22 203 lb (92.1 kg)  10/14/22 205 lb 6.4 oz (93.2 kg)  10/01/22 205 lb (93 kg)   2) Sinus Pressure: Acute for the last two weeks, located behind her eyes. She's blowing her nose and is seeing brownish/green color. She's also now developed chest congestion and a deep cough. She's been tested for Covid-19 numerous times, all of which has been negative.  Overall she's not improving. She's tried taking Sudafed PE and Alka-Seltzer without improvement.     Review of Systems  Constitutional:  Positive for fatigue. Negative for fever.  HENT:  Positive for congestion and sinus pressure.   Respiratory:  Positive for cough. Negative for shortness of breath.   Cardiovascular:  Negative for chest pain.   Gastrointestinal:  Positive for constipation. Negative for abdominal pain.  Neurological:  Positive for numbness.         Past Medical History:  Diagnosis Date   H/O gestational diabetes mellitus, not currently pregnant    Ovarian cyst    Palpitations    Sigmoid diverticulitis 08/2012   Subclinical hypothyroidism    Type 2 diabetes mellitus ([place])     Social History   Socioeconomic History   Marital status: Divorced    Spouse name: Not on file   Number of children: Not on file   Years of education: Not on file   Highest education level: Not on file  Occupational History   Not on file  Tobacco Use   Smoking status: Former    Types: Cigarettes, Cigars    Quit date: 09/19/2011    Years since quitting: 11.2   Smokeless tobacco: Never  Vaping Use   Vaping Use: Former  Substance and Sexual Activity   Alcohol use: No   Drug use: No   Sexual activity: Not on file  Other Topics Concern   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   Social Determinants of Health   Financial Resource Strain: Not on file  Food Insecurity: Not on file  Transportation Needs: Not on file  Physical Activity: Not on file  Stress: Not on file  Social Connections: Not on file  Intimate Partner Violence: Not on file    Past  Surgical History:  Procedure Laterality Date   BLADDER SUSPENSION  2009   Colectomy Left    likely sigmoid, open.  for diverticulitis.   COLONOSCOPY WITH PROPOFOL N/A 10/01/2022   Procedure: COLONOSCOPY WITH PROPOFOL;  Surgeon: Lin Landsman, MD;  Location: [facility] ENDOSCOPY;  Service: Gastroenterology;  Laterality: N/A;   SMALL INTESTINE SURGERY     TUBAL LIGATION      Family History  Problem Relation Age of Onset   Heart disease Father    Kidney disease Father    Diabetes Father    Diabetes Maternal Grandmother    Diabetes Paternal Grandmother    Breast cancer Neg Hx      Allergies  Allergen Reactions   Flagyl [Metronidazole] Itching    Current Outpatient Medications on File Prior to Visit  Medication Sig Dispense Refill   atenolol (TENORMIN) 25 MG tablet Take 1 tablet (25 mg total) by mouth daily. For palpitations. 90 tablet 2   Biotin 1000 MCG tablet Take 1,000 mcg by mouth daily.     blood glucose meter kit and supplies KIT Dispense based on patient and insurance preference. Use up to four times daily as directed. (FOR ICD-9 250.00, 250.01). 1 each 0   cyanocobalamin 500 MCG tablet Take 1,000 mcg by mouth daily. Reported on 06/12/2016     docusate sodium (COLACE) 100 MG capsule Take 200 mg by mouth daily. Reported on 06/12/2016     metFORMIN (GLUCOPHAGE) 500 MG tablet TAKE 1 TABLET BY MOUTH 2 TIMES DAILY WITH A MEAL. 180 tablet 1   rosuvastatin (CRESTOR) 5 MG tablet TAKE 1 TABLET (5 MG TOTAL) BY MOUTH EVERY EVENING. FOR CHOLESTEROL. 90 tablet 2   Semaglutide,0.25 or 0.'5MG'$ /DOS, (OZEMPIC, 0.25 OR 0.5 MG/DOSE,) 2 MG/3ML SOPN Inject 0.25 mg into the skin once weekly x 4 weeks, then increase to 0.5 mg weekly thereafter for diabetes. 9 mL 0   No current facility-administered medications on file prior to visit.    BP 132/76   Pulse 77   Temp 98.4 F (36.9 C) (Temporal)   Ht '5\' 5"'$  (1.651 m)   Wt 203 lb (92.1 kg)   LMP 11/03/2012   SpO2 99%   BMI 33.78 kg/m  Objective:   Physical Exam Constitutional:      Appearance: She is not ill-appearing.  HENT:     Right Ear: Tympanic membrane and ear canal normal.     Left Ear: Tympanic membrane and ear canal normal.  Cardiovascular:     Rate and Rhythm: Normal rate and regular rhythm.  Pulmonary:     Effort: Pulmonary effort is normal.     Breath sounds: Normal breath sounds.  Musculoskeletal:     Cervical back: Neck supple.  Skin:    General: Skin is warm and dry.           Assessment & Plan:  Non-insulin dependent type 2 diabetes mellitus ([place]) Assessment & Plan: Improved and controlled with  A1C 6.2 today!  Continue metformin 500 mg BID and Ozempic 0.5 mg weekly. Foot exam today.  Follow up in 6 months.  Orders: -     POCT glycosylated hemoglobin (Hb A1C)  Acute non-recurrent sinusitis, unspecified location Assessment & Plan: Suspicious for bacterial involvement at this point.  Start Augmentin antibiotics. Take 1 tablet by mouth twice daily for 7 days. Discussed to stop Sudafed PE. Discussed other home care options.  Follow up PRN.  Orders: -     Amoxicillin-Pot Clavulanate; Take 1 tablet by mouth 2 (  two) times daily.  Dispense: 14 tablet; Refill: 0        Pleas Koch, NP

## 2022-12-18 NOTE — Patient Instructions (Addendum)
Continue taking Ozmepic 0.5 mg weekly and metformin 500 mg twice daily.  Please schedule a follow up visit for 6 months for a diabetes check.  You can try magnesium glycinate 200-400 mg daily.  It was a pleasure to see you today!

## 2022-12-18 NOTE — Assessment & Plan Note (Signed)
Suspicious for bacterial involvement at this point.  Start Augmentin antibiotics. Take 1 tablet by mouth twice daily for 7 days. Discussed to stop Sudafed PE. Discussed other home care options.  Follow up PRN.

## 2022-12-18 NOTE — Telephone Encounter (Signed)
From: Patriciaann Clan Provencio To: Office of Pleas Koch, NP Sent: 12/18/2022 9:31 AM EST Subject: Medication Renewal Request  Refills have been requested for the following medications:   Semaglutide,0.25 or 0.'5MG'$ /DOS, (OZEMPIC, 0.25 OR 0.5 MG/DOSE,) 2 MG/3ML SOPN [Sevan Mcbroom K Kenneth Lax]  Preferred pharmacy: [id number] [place]: [employer]

## 2022-12-18 NOTE — Assessment & Plan Note (Signed)
Improved and controlled with A1C 6.2 today!  Continue metformin 500 mg BID and Ozempic 0.5 mg weekly. Foot exam today.  Follow up in 6 months.

## 2022-12-22 ENCOUNTER — Encounter
Admission: RE | Admit: 2022-12-22 | Discharge: 2022-12-22 | Disposition: A | Discharge status: Home / Self Care | Payer: Commercial Managed Care - PPO | Source: Ambulatory Visit | Attending: Surgery | Admitting: Surgery

## 2022-12-22 VITALS — Ht 65.0 in | Wt 200.0 lb

## 2022-12-22 DIAGNOSIS — E119 Type 2 diabetes mellitus without complications: Secondary | ICD-10-CM

## 2022-12-22 DIAGNOSIS — R002 Palpitations: Secondary | ICD-10-CM

## 2022-12-22 NOTE — Patient Instructions (Addendum)
Your procedure is scheduled on: Monday December 29, 2022. Report to Day Surgery inside [facility] 2nd floor, stop by registration desk before getting on elevator.  To find out your arrival time please call [phone] between 1PM - 3PM on Friday December 26, 2022.  Remember: Instructions that are not followed completely may result in serious medical risk,  up to and including death, or upon the discretion of your surgeon and anesthesiologist your  surgery may need to be rescheduled.     _X__ 1. Do not eat food or drink fluids after midnight the night before your procedure.                 No chewing gum or hard candies.   __X__2.  On the morning of surgery brush your teeth with toothpaste and water, you                may rinse your mouth with mouthwash if you wish.  Do not swallow any toothpaste or mouthwash.     _X__ 3.  No Alcohol for 24 hours before or after surgery.   _X__ 4.  Do Not Smoke or use e-cigarettes For 24 Hours Prior to Your Surgery.                 Do not use any chewable tobacco products for at least 6 hours prior to                 Surgery.  _X__  5.  Do not use any recreational drugs (marijuana, cocaine, heroin, ecstasy, MDMA or other)                For at least one week prior to your surgery.  Combination of these drugs with anesthesia                May have life threatening results.  ____  6.  Bring all medications with you on the day of surgery if instructed.   __X__  7.  Notify your doctor if there is any change in your medical condition      (cold, fever, infections).     Do not wear jewelry, make-up, hairpins, clips or nail polish. Do not wear lotions, powders, or perfumes. You may wear deodorant. Do not shave 48 hours prior to surgery. Men may shave face and neck. Do not bring valuables to the hospital.    [facility] is not responsible for any belongings or valuables.  Contacts, dentures or bridgework may not be worn into  surgery. Leave your suitcase in the car. After surgery it may be brought to your room. For patients admitted to the hospital, discharge time is determined by your treatment team.   Patients discharged the day of surgery will not be allowed to drive home.   Make arrangements for someone to be with you for the first 24 hours of your Same Day Discharge.   __X__ Take these medicines the morning of surgery with A SIP OF WATER:    1. None   2.   3.   4.  5.  6.  ____ Fleet Enema (as directed)   __X__ Use CHG Soap (or wipes) as directed  ____ Use Benzoyl Peroxide Gel as instructed  ____ Use inhalers on the day of surgery  __X__ Stop metformin 2 days prior to surgery (take last dose Friday 12/26/22)    __X__ Already stopped Semaglutide,0.25 or 0.'5MG'$ /DOS, (OZEMPIC, 0.25 OR 0.5 MG/DOSE,) 2 MG/3ML SOPN one  week before surgery.   ____ Call your PCP, cardiologist, or Pulmonologist if taking Coumadin/Plavix/aspirin and ask when to stop before your surgery.   __X__ One Week prior to surgery- Stop Anti-inflammatories such as Ibuprofen, Aleve, Advil, Motrin, meloxicam (MOBIC), diclofenac, etodolac, ketorolac, Toradol, Daypro, piroxicam, Goody's or BC powders. OK TO USE TYLENOL IF NEEDED   __X__ One week prior to surgery stop ALL supplements until after surgery.  Biotin, cyanocobalamin and magnesium.  ____ Bring C-Pap to the hospital.    If you have any questions regarding your pre-procedure instructions,  Please call Pre-admit Testing at [phone]    Preparing for Surgery with CHLORHEXIDINE GLUCONATE (CHG) Soap  Chlorhexidine Gluconate (CHG) Soap  o An antiseptic cleaner that kills germs and bonds with the skin to continue killing germs even after washing  o Used for showering the night before surgery and morning of surgery  Before surgery, you can play an important role by reducing the number of germs on your skin.  CHG (Chlorhexidine gluconate) soap is an antiseptic cleanser  which kills germs and bonds with the skin to continue killing germs even after washing.  Please do not use if you have an allergy to CHG or antibacterial soaps. If your skin becomes reddened/irritated stop using the CHG.  1. Shower the NIGHT BEFORE SURGERY and the MORNING OF SURGERY with CHG soap.  2. If you choose to wash your hair, wash your hair first as usual with your normal shampoo.  3. After shampooing, rinse your hair and body thoroughly to remove the shampoo.  4. Use CHG as you would any other liquid soap. You can apply CHG directly to the skin and wash gently with a scrungie or a clean washcloth.  5. Apply the CHG soap to your body only from the neck down. Do not use on open wounds or open sores. Avoid contact with your eyes, ears, mouth, and genitals (private parts). Wash face and genitals (private parts) with your normal soap.  6. Wash thoroughly, paying special attention to the area where your surgery will be performed.  7. Thoroughly rinse your body with warm water.  8. Do not shower/wash with your normal soap after using and rinsing off the CHG soap.  9. Pat yourself dry with a clean towel.  10. Wear clean pajamas to bed the night before surgery.  12. Place clean sheets on your bed the night of your first shower and do not sleep with pets.  13. Shower again with the CHG soap on the day of surgery prior to arriving at the hospital.  14. Do not apply any deodorants/lotions/powders.  15. Please wear clean clothes to the hospital.

## 2022-12-23 ENCOUNTER — Encounter
Admission: RE | Admit: 2022-12-23 | Discharge: 2022-12-23 | Disposition: A | Discharge status: Home / Self Care | Payer: Commercial Managed Care - PPO | Source: Ambulatory Visit | Attending: Surgery | Admitting: Surgery

## 2022-12-23 DIAGNOSIS — R002 Palpitations: Secondary | ICD-10-CM | POA: Diagnosis not present

## 2022-12-23 DIAGNOSIS — Z01810 Encounter for preprocedural cardiovascular examination: Secondary | ICD-10-CM | POA: Insufficient documentation

## 2022-12-28 MED ORDER — CHLORHEXIDINE GLUCONATE 0.12 % MT SOLN: 15.0000 mL | Freq: Once | OROMUCOSAL | Status: AC

## 2022-12-28 MED ORDER — ACETAMINOPHEN 500 MG PO TABS: 1000.0000 mg | ORAL_TABLET | ORAL | Status: AC

## 2022-12-28 MED ORDER — SODIUM CHLORIDE 0.9 % IV SOLN: INTRAVENOUS | Status: DC

## 2022-12-28 MED ORDER — ALVIMOPAN 12 MG PO CAPS: 12.0000 mg | ORAL_CAPSULE | ORAL | Status: AC

## 2022-12-28 MED ORDER — FAMOTIDINE 20 MG PO TABS: 20.0000 mg | ORAL_TABLET | Freq: Once | ORAL | Status: AC

## 2022-12-28 MED ORDER — CHLORHEXIDINE GLUCONATE CLOTH 2 % EX PADS: 6.0000 | MEDICATED_PAD | Freq: Once | CUTANEOUS | Status: DC

## 2022-12-28 MED ORDER — ORAL CARE MOUTH RINSE: 15.0000 mL | Freq: Once | OROMUCOSAL | Status: AC

## 2022-12-28 MED ORDER — BUPIVACAINE LIPOSOME 1.3 % IJ SUSP: 20.0000 mL | Freq: Once | INTRAMUSCULAR | Status: DC

## 2022-12-28 MED ORDER — GABAPENTIN 300 MG PO CAPS: 300.0000 mg | ORAL_CAPSULE | ORAL | Status: AC

## 2022-12-28 MED ORDER — CHLORHEXIDINE GLUCONATE CLOTH 2 % EX PADS
6.0000 | MEDICATED_PAD | Freq: Once | CUTANEOUS | Status: AC
  Administered 2022-12-29: 6 via TOPICAL

## 2022-12-28 MED ORDER — CELECOXIB 200 MG PO CAPS: 200.0000 mg | ORAL_CAPSULE | ORAL | Status: AC

## 2022-12-28 MED ORDER — SODIUM CHLORIDE 0.9 % IV SOLN
2.0000 g | INTRAVENOUS | Status: AC
  Administered 2022-12-29: 2 g via INTRAVENOUS

## 2022-12-29 ENCOUNTER — Ambulatory Visit
Admission: RE | Admit: 2022-12-29 | Discharge: 2022-12-29 | Disposition: A | Discharge status: Home / Self Care | Payer: Commercial Managed Care - PPO | Attending: Surgery | Admitting: Surgery

## 2022-12-29 ENCOUNTER — Other Ambulatory Visit: Payer: Self-pay | Admitting: Surgery

## 2022-12-29 ENCOUNTER — Other Ambulatory Visit: Payer: Self-pay

## 2022-12-29 ENCOUNTER — Ambulatory Visit: Payer: Commercial Managed Care - PPO | Admitting: Urgent Care

## 2022-12-29 ENCOUNTER — Ambulatory Visit: Payer: Commercial Managed Care - PPO | Admitting: Anesthesiology

## 2022-12-29 ENCOUNTER — Encounter: Payer: Self-pay | Admitting: Surgery

## 2022-12-29 ENCOUNTER — Encounter
Admission: RE | Disposition: A | Discharge status: Home / Self Care | Payer: Self-pay | Source: Home / Self Care | Attending: Surgery

## 2022-12-29 DIAGNOSIS — E119 Type 2 diabetes mellitus without complications: Secondary | ICD-10-CM

## 2022-12-29 DIAGNOSIS — K660 Peritoneal adhesions (postprocedural) (postinfection): Secondary | ICD-10-CM

## 2022-12-29 DIAGNOSIS — D121 Benign neoplasm of appendix: Secondary | ICD-10-CM | POA: Insufficient documentation

## 2022-12-29 DIAGNOSIS — Z87891 Personal history of nicotine dependence: Secondary | ICD-10-CM | POA: Diagnosis not present

## 2022-12-29 DIAGNOSIS — Z9049 Acquired absence of other specified parts of digestive tract: Secondary | ICD-10-CM | POA: Diagnosis not present

## 2022-12-29 DIAGNOSIS — K219 Gastro-esophageal reflux disease without esophagitis: Secondary | ICD-10-CM | POA: Diagnosis not present

## 2022-12-29 DIAGNOSIS — Z8632 Personal history of gestational diabetes: Secondary | ICD-10-CM | POA: Diagnosis not present

## 2022-12-29 DIAGNOSIS — K389 Disease of appendix, unspecified: Secondary | ICD-10-CM | POA: Diagnosis present

## 2022-12-29 HISTORY — PX: XI ROBOTIC LAPAROSCOPIC ASSISTED APPENDECTOMY: SHX6877

## 2022-12-29 LAB — GLUCOSE, CAPILLARY
Glucose-Capillary: 155 mg/dL — ABNORMAL HIGH (ref 70–99)
Glucose-Capillary: 211 mg/dL — ABNORMAL HIGH (ref 70–99)

## 2022-12-29 SURGERY — APPENDECTOMY, ROBOT-ASSISTED, LAPAROSCOPIC
Anesthesia: General

## 2022-12-29 MED ORDER — KETAMINE HCL 50 MG/5ML IJ SOSY
PREFILLED_SYRINGE | INTRAMUSCULAR | Status: AC
  Filled 2022-12-29: qty 5

## 2022-12-29 MED ORDER — PHENYLEPHRINE HCL-NACL 20-0.9 MG/250ML-% IV SOLN
INTRAVENOUS | Status: AC
  Filled 2022-12-29: qty 250

## 2022-12-29 MED ORDER — FENTANYL CITRATE (PF) 100 MCG/2ML IJ SOLN
INTRAMUSCULAR | Status: AC
  Filled 2022-12-29: qty 2

## 2022-12-29 MED ORDER — FENTANYL CITRATE (PF) 100 MCG/2ML IJ SOLN
25.0000 ug | INTRAMUSCULAR | Status: DC | PRN
  Administered 2022-12-29: 50 ug via INTRAVENOUS
  Administered 2022-12-29: 25 ug via INTRAVENOUS

## 2022-12-29 MED ORDER — DEXAMETHASONE SODIUM PHOSPHATE 10 MG/ML IJ SOLN
INTRAMUSCULAR | Status: AC
  Filled 2022-12-29: qty 2

## 2022-12-29 MED ORDER — ROCURONIUM BROMIDE 10 MG/ML (PF) SYRINGE
PREFILLED_SYRINGE | INTRAVENOUS | Status: AC
  Filled 2022-12-29: qty 10

## 2022-12-29 MED ORDER — ONDANSETRON HCL 4 MG/2ML IJ SOLN
INTRAMUSCULAR | Status: AC
  Filled 2022-12-29: qty 6

## 2022-12-29 MED ORDER — OXYCODONE HCL 5 MG PO TABS
5.0000 mg | ORAL_TABLET | Freq: Once | ORAL | Status: AC | PRN
  Administered 2022-12-29: 5 mg via ORAL

## 2022-12-29 MED ORDER — GLYCOPYRROLATE 0.2 MG/ML IJ SOLN
INTRAMUSCULAR | Status: AC
  Filled 2022-12-29: qty 1

## 2022-12-29 MED ORDER — PHENYLEPHRINE 80 MCG/ML (10ML) SYRINGE FOR IV PUSH (FOR BLOOD PRESSURE SUPPORT)
PREFILLED_SYRINGE | INTRAVENOUS | Status: AC
  Filled 2022-12-29: qty 20

## 2022-12-29 MED ORDER — SUGAMMADEX SODIUM 200 MG/2ML IV SOLN
INTRAVENOUS | Status: DC | PRN
  Administered 2022-12-29: 200 mg via INTRAVENOUS

## 2022-12-29 MED ORDER — KETAMINE HCL 10 MG/ML IJ SOLN
INTRAMUSCULAR | Status: DC | PRN
  Administered 2022-12-29: 20 mg via INTRAVENOUS
  Administered 2022-12-29: 10 mg via INTRAVENOUS

## 2022-12-29 MED ORDER — INSULIN ASPART 100 UNIT/ML IJ SOLN
INTRAMUSCULAR | Status: AC
  Filled 2022-12-29: qty 1

## 2022-12-29 MED ORDER — HYDROCODONE-ACETAMINOPHEN 5-325 MG PO TABS: 1.0000 | ORAL_TABLET | Freq: Four times a day (QID) | ORAL | 0 refills | Status: DC | PRN

## 2022-12-29 MED ORDER — BUPIVACAINE-EPINEPHRINE 0.5% -1:200000 IJ SOLN
INTRAMUSCULAR | Status: DC | PRN
  Administered 2022-12-29: 10 mL
  Administered 2022-12-29: 20 mL

## 2022-12-29 MED ORDER — PHENYLEPHRINE 80 MCG/ML (10ML) SYRINGE FOR IV PUSH (FOR BLOOD PRESSURE SUPPORT)
PREFILLED_SYRINGE | INTRAVENOUS | Status: DC | PRN
  Administered 2022-12-29 (×2): 40 ug via INTRAVENOUS
  Administered 2022-12-29 (×4): 80 ug via INTRAVENOUS

## 2022-12-29 MED ORDER — ROCURONIUM BROMIDE 100 MG/10ML IV SOLN
INTRAVENOUS | Status: DC | PRN
  Administered 2022-12-29: 10 mg via INTRAVENOUS
  Administered 2022-12-29: 50 mg via INTRAVENOUS
  Administered 2022-12-29 (×2): 10 mg via INTRAVENOUS

## 2022-12-29 MED ORDER — IBUPROFEN 800 MG PO TABS: 800.0000 mg | ORAL_TABLET | Freq: Three times a day (TID) | ORAL | 0 refills | Status: DC | PRN

## 2022-12-29 MED ORDER — LIDOCAINE HCL (CARDIAC) PF 100 MG/5ML IV SOSY
PREFILLED_SYRINGE | INTRAVENOUS | Status: DC | PRN
  Administered 2022-12-29: 100 mg via INTRAVENOUS

## 2022-12-29 MED ORDER — BUPIVACAINE LIPOSOME 1.3 % IJ SUSP
INTRAMUSCULAR | Status: AC
  Filled 2022-12-29: qty 20

## 2022-12-29 MED ORDER — PROPOFOL 10 MG/ML IV BOLUS
INTRAVENOUS | Status: AC
  Filled 2022-12-29: qty 20

## 2022-12-29 MED ORDER — SEVOFLURANE IN SOLN
RESPIRATORY_TRACT | Status: AC
  Filled 2022-12-29: qty 250

## 2022-12-29 MED ORDER — BUPIVACAINE-EPINEPHRINE (PF) 0.5% -1:200000 IJ SOLN
INTRAMUSCULAR | Status: AC
  Filled 2022-12-29: qty 30

## 2022-12-29 MED ORDER — OXYCODONE HCL 5 MG PO TABS
5.0000 mg | ORAL_TABLET | Freq: Once | ORAL | Status: AC
  Administered 2022-12-29: 5 mg via ORAL

## 2022-12-29 MED ORDER — PHENYLEPHRINE 80 MCG/ML (10ML) SYRINGE FOR IV PUSH (FOR BLOOD PRESSURE SUPPORT)
PREFILLED_SYRINGE | INTRAVENOUS | Status: AC
  Filled 2022-12-29: qty 10

## 2022-12-29 MED ORDER — SODIUM CHLORIDE 0.9 % IV SOLN
INTRAVENOUS | Status: AC
  Filled 2022-12-29: qty 2

## 2022-12-29 MED ORDER — OXYCODONE HCL 5 MG PO TABS
ORAL_TABLET | ORAL | Status: AC
  Filled 2022-12-29: qty 1

## 2022-12-29 MED ORDER — LACTATED RINGERS IV SOLN: INTRAVENOUS | Status: DC | PRN

## 2022-12-29 MED ORDER — MIDAZOLAM HCL 2 MG/2ML IJ SOLN
INTRAMUSCULAR | Status: DC | PRN
  Administered 2022-12-29: 2 mg via INTRAVENOUS

## 2022-12-29 MED ORDER — ONDANSETRON HCL 4 MG PO TABS: 4.0000 mg | ORAL_TABLET | Freq: Three times a day (TID) | ORAL | 0 refills | Status: DC | PRN

## 2022-12-29 MED ORDER — GABAPENTIN 300 MG PO CAPS
ORAL_CAPSULE | ORAL | Status: AC
  Administered 2022-12-29: 300 mg via ORAL
  Filled 2022-12-29: qty 1

## 2022-12-29 MED ORDER — ONDANSETRON HCL 4 MG/2ML IJ SOLN
INTRAMUSCULAR | Status: AC
  Filled 2022-12-29: qty 4

## 2022-12-29 MED ORDER — DEXAMETHASONE SODIUM PHOSPHATE 10 MG/ML IJ SOLN
INTRAMUSCULAR | Status: AC
  Filled 2022-12-29: qty 3

## 2022-12-29 MED ORDER — PROPOFOL 10 MG/ML IV BOLUS
INTRAVENOUS | Status: DC | PRN
  Administered 2022-12-29: 150 mg via INTRAVENOUS

## 2022-12-29 MED ORDER — CELECOXIB 200 MG PO CAPS
ORAL_CAPSULE | ORAL | Status: AC
  Administered 2022-12-29: 200 mg via ORAL
  Filled 2022-12-29: qty 1

## 2022-12-29 MED ORDER — CHLORHEXIDINE GLUCONATE 0.12 % MT SOLN
OROMUCOSAL | Status: AC
  Administered 2022-12-29: 15 mL via OROMUCOSAL
  Filled 2022-12-29: qty 15

## 2022-12-29 MED ORDER — ACETAMINOPHEN 500 MG PO TABS
ORAL_TABLET | ORAL | Status: AC
  Administered 2022-12-29: 1000 mg via ORAL
  Filled 2022-12-29: qty 2

## 2022-12-29 MED ORDER — DEXMEDETOMIDINE HCL IN NACL 80 MCG/20ML IV SOLN
INTRAVENOUS | Status: AC
  Filled 2022-12-29: qty 20

## 2022-12-29 MED ORDER — FAMOTIDINE 20 MG PO TABS
ORAL_TABLET | ORAL | Status: AC
  Administered 2022-12-29: 20 mg via ORAL
  Filled 2022-12-29: qty 1

## 2022-12-29 MED ORDER — ONDANSETRON HCL 4 MG/2ML IJ SOLN
INTRAMUSCULAR | Status: DC | PRN
  Administered 2022-12-29: 4 mg via INTRAVENOUS

## 2022-12-29 MED ORDER — DEXMEDETOMIDINE HCL IN NACL 80 MCG/20ML IV SOLN
INTRAVENOUS | Status: DC | PRN
  Administered 2022-12-29: 8 ug via BUCCAL
  Administered 2022-12-29 (×3): 4 ug via BUCCAL

## 2022-12-29 MED ORDER — 0.9 % SODIUM CHLORIDE (POUR BTL) OPTIME
TOPICAL | Status: DC | PRN
  Administered 2022-12-29: 500 mL

## 2022-12-29 MED ORDER — SUGAMMADEX SODIUM 500 MG/5ML IV SOLN
INTRAVENOUS | Status: AC
  Filled 2022-12-29: qty 10

## 2022-12-29 MED ORDER — DEXAMETHASONE SODIUM PHOSPHATE 10 MG/ML IJ SOLN
INTRAMUSCULAR | Status: DC | PRN
  Administered 2022-12-29: 5 mg via INTRAVENOUS

## 2022-12-29 MED ORDER — ALVIMOPAN 12 MG PO CAPS
ORAL_CAPSULE | ORAL | Status: AC
  Administered 2022-12-29: 12 mg via ORAL
  Filled 2022-12-29: qty 1

## 2022-12-29 MED ORDER — OXYCODONE HCL 5 MG/5ML PO SOLN: 5.0000 mg | Freq: Once | ORAL | Status: AC | PRN

## 2022-12-29 MED ORDER — FENTANYL CITRATE (PF) 100 MCG/2ML IJ SOLN
INTRAMUSCULAR | Status: DC | PRN
  Administered 2022-12-29: 100 ug via INTRAVENOUS

## 2022-12-29 MED ORDER — INSULIN ASPART 100 UNIT/ML IJ SOLN
4.0000 [IU] | Freq: Once | INTRAMUSCULAR | Status: AC
  Administered 2022-12-29: 4 [IU] via SUBCUTANEOUS

## 2022-12-29 MED ORDER — MIDAZOLAM HCL 2 MG/2ML IJ SOLN
INTRAMUSCULAR | Status: AC
  Filled 2022-12-29: qty 2

## 2022-12-29 SURGICAL SUPPLY — 53 items
BAG PRESSURE INF REUSE 3000 (BAG) IMPLANT
BLADE CLIPPER SURG (BLADE) IMPLANT
CANNULA REDUC XI 12-8 STAPL (CANNULA) ×1
CANNULA REDUCER 12-8 DVNC XI (CANNULA) IMPLANT
COVER TIP SHEARS 8 DVNC (MISCELLANEOUS) ×1 IMPLANT
COVER TIP SHEARS 8MM DA VINCI (MISCELLANEOUS) ×1
CUTTER FLEX LINEAR 45M (STAPLE) IMPLANT
DERMABOND ADVANCED .7 DNX12 (GAUZE/BANDAGES/DRESSINGS) ×1 IMPLANT
DRAPE ARM DVNC X/XI (DISPOSABLE) ×3 IMPLANT
DRAPE COLUMN DVNC XI (DISPOSABLE) ×1 IMPLANT
DRAPE DA VINCI XI ARM (DISPOSABLE) ×4
DRAPE DA VINCI XI COLUMN (DISPOSABLE) ×1
GLOVE ORTHO TXT STRL SZ7.5 (GLOVE) ×2 IMPLANT
GOWN STRL REUS W/ TWL LRG LVL3 (GOWN DISPOSABLE) ×1 IMPLANT
GOWN STRL REUS W/ TWL XL LVL3 (GOWN DISPOSABLE) ×2 IMPLANT
GOWN STRL REUS W/TWL LRG LVL3 (GOWN DISPOSABLE) ×1
GOWN STRL REUS W/TWL XL LVL3 (GOWN DISPOSABLE) ×2
GRASPER SUT TROCAR 14GX15 (MISCELLANEOUS) IMPLANT
IRRIGATION STRYKERFLOW (MISCELLANEOUS) IMPLANT
IRRIGATOR STRYKERFLOW (MISCELLANEOUS)
IRRIGATOR SUCT 8 DISP DVNC XI (IRRIGATION / IRRIGATOR) IMPLANT
IRRIGATOR SUCTION 8MM XI DISP (IRRIGATION / IRRIGATOR)
IV NS IRRIG 3000ML ARTHROMATIC (IV SOLUTION) IMPLANT
KIT PINK PAD W/HEAD ARE REST (MISCELLANEOUS) ×1
KIT PINK PAD W/HEAD ARM REST (MISCELLANEOUS) ×1 IMPLANT
KIT TURNOVER KIT A (KITS) ×1 IMPLANT
LABEL OR SOLS (LABEL) ×1 IMPLANT
MANIFOLD NEPTUNE II (INSTRUMENTS) ×1 IMPLANT
NDL INSUFFLATION 14GA 120MM (NEEDLE) IMPLANT
NEEDLE HYPO 22GX1.5 SAFETY (NEEDLE) ×1 IMPLANT
NEEDLE INSUFFLATION 14GA 120MM (NEEDLE) ×1 IMPLANT
NS IRRIG 500ML POUR BTL (IV SOLUTION) ×1 IMPLANT
PACK LAP CHOLECYSTECTOMY (MISCELLANEOUS) ×1 IMPLANT
RELOAD 45 VASCULAR/THIN (ENDOMECHANICALS) ×2 IMPLANT
RELOAD STAPLE 45 2.5 WHT GRN (ENDOMECHANICALS) IMPLANT
SCISSORS METZENBAUM CVD 33 (INSTRUMENTS) IMPLANT
SEAL CANN UNIV 5-8 DVNC XI (MISCELLANEOUS) ×3 IMPLANT
SEAL XI 5MM-8MM UNIVERSAL (MISCELLANEOUS) ×3
SET TUBE SMOKE EVAC HIGH FLOW (TUBING) ×1 IMPLANT
SPIKE FLUID TRANSFER (MISCELLANEOUS) ×1 IMPLANT
STAPLER CANNULA SEAL DVNC XI (STAPLE) ×1 IMPLANT
STAPLER CANNULA SEAL XI (STAPLE) ×1
SUT MNCRL 4-0 (SUTURE) ×2
SUT MNCRL 4-0 27XMFL (SUTURE) ×2
SUT VIC AB 0 SH 27 (SUTURE) ×1 IMPLANT
SUT VICRYL 0 UR6 27IN ABS (SUTURE) ×1 IMPLANT
SUTURE MNCRL 4-0 27XMF (SUTURE) ×1 IMPLANT
SYS BAG RETRIEVAL 10MM (BASKET) ×1
SYSTEM BAG RETRIEVAL 10MM (BASKET) ×1 IMPLANT
TRAP FLUID SMOKE EVACUATOR (MISCELLANEOUS) ×1 IMPLANT
TRAY FOLEY SLVR 16FR LF STAT (SET/KITS/TRAYS/PACK) ×1 IMPLANT
TROCAR Z-THREAD FIOS 11X100 BL (TROCAR) ×1 IMPLANT
WATER STERILE IRR 500ML POUR (IV SOLUTION) ×1 IMPLANT

## 2022-12-29 NOTE — H&P (Signed)
She was at work, and I am in the office.  10 minutes were spent on the phone together.    I reviewed the prior report with the additonal review of the  prior colonoscopy, and the latest CT imaging.     Obviously the exam below is based on the last visit.  Rebecca Macias reports she remains in good health.    Chief Complaint: Appendiceal orifice polyp   History of Present Illness Rebecca Macias is a 62 y.o. female with findings on a screening colonoscopy, revealing a polyp protruding from the appendiceal orifice.  Another rectal polyp was noted to be hyperplastic after biopsy.  She otherwise denies any history of hematochezia.  She has had a prior sigmoid resection for diverticulitis, converted to open due to the reported size of her uterus.   Colonoscopy findings: A 9 mm polyp was found in the appendiceal orifice. The polyp was sessile. Polypectomy was not attempted due to location of the polyp. Cecum : Single Polyp A 3 mm polyp was found in the rectum. The polyp was sessile. The polyp was removed with a cold snare. Resection and retrieval were complete. The retroflexed view of the distal rectum and anal verge was normal and showed no anal or rectal abnormalities. A few diverticula were found in the entire colon. - The examined portion of the ileum was normal. - One 9 mm polyp at the appendiceal orifice. Resection not attempted. - One 3 mm polyp in the rectum, removed with a cold snare. Resected and retrieved. - The distal rectum and anal verge are normal on retroflexion view. - Diverticulosis in the entire examined colon. Impression: - Discharge patient to home (with escort). - Resume previous diet today. - Continue present medications. - Await pathology results. - Refer to a surgeon at appointment to be scheduled for appendectomy or cecectomy. - Repeat colonoscopy in 5 to 7 years for surveillance based on pathology results.    Past Medical History     Past Medical History:   Diagnosis Date   H/O gestational diabetes mellitus, not currently pregnant     Ovarian cyst     Palpitations     Sigmoid diverticulitis 08/2012   Subclinical hypothyroidism     Type 2 diabetes mellitus ([place])               Past Surgical History:  Procedure Laterality Date   BLADDER SUSPENSION   2009   Colectomy Left      likely sigmoid, open.  for diverticulitis.   COLONOSCOPY WITH PROPOFOL N/A 10/01/2022    Procedure: COLONOSCOPY WITH PROPOFOL;  Surgeon: Lin Landsman, MD;  Location: [facility] END[facility]COPY;  Service: Gastroenterology;  Laterality: N/A;   SMALL INTESTINE SURGERY       TUBAL LIGATION              Allergies  Allergen Reactions   Flagyl [Metronidazole] Itching            Current Outpatient Medications  Medication Sig Dispense Refill   atenolol (TENORMIN) 25 MG tablet Take 1 tablet (25 mg total) by mouth daily. For palpitations. 90 tablet 2   Biotin 1000 MCG tablet Take 1,000 mcg by mouth daily.       blood glucose meter kit and supplies KIT Dispense based on patient and insurance preference. Use up to four times daily as directed. (FOR ICD-9 250.00, 250.01). 1 each 0   cyanocobalamin 500 MCG tablet Take 1,000 mcg by mouth daily. Reported on 06/12/2016  docusate sodium (COLACE) 100 MG capsule Take 200 mg by mouth daily. Reported on 06/12/2016       metFORMIN (GLUCOPHAGE) 500 MG tablet TAKE 1 TABLET BY MOUTH 2 TIMES DAILY WITH A MEAL. 180 tablet 1   rosuvastatin (CRESTOR) 5 MG tablet TAKE 1 TABLET (5 MG TOTAL) BY MOUTH EVERY EVENING. FOR CHOLESTEROL. 90 tablet 2   Semaglutide,0.25 or 0.'5MG'$ /DOS, (OZEMPIC, 0.25 OR 0.5 MG/DOSE,) 2 MG/3ML SOPN Inject 0.25 mg into the skin once weekly x 4 weeks, then increase to 0.5 mg weekly thereafter for diabetes. 9 mL 0    No current facility-administered medications for this visit.      Family History      Family History  Problem Relation Age of Onset   Heart disease Father     Kidney disease Father     Diabetes Father      Diabetes Maternal Grandmother     Diabetes Paternal Grandmother     Breast cancer Neg Hx          Social History Social History         Tobacco Use   Smoking status: Former      Types: Cigarettes, Cigars      Quit date: 09/19/2011      Years since quitting: 11.1   Smokeless tobacco: Never  Vaping Use   Vaping Use: Former  Substance Use Topics   Alcohol use: No   Drug use: No          Review of Systems  Constitutional: Negative.   HENT: Negative.    Eyes: Negative.   Respiratory: Negative.    Cardiovascular: Negative.   Gastrointestinal:  Positive for constipation.  Genitourinary: Negative.   Skin: Negative.   Neurological: Negative.   Psychiatric/Behavioral: Negative.          Physical Exam Last menstrual period 11/03/2012.      CONSTITUTIONAL: Well developed, and nourished, appropriately responsive and aware without distress.   EYES: Sclera non-icteric.   EARS, NOSE, MOUTH AND THROAT:  The oropharynx is clear. Oral mucosa is pink and moist.   Hearing is intact to voice.  NECK: Trachea is midline, and there is no jugular venous distension.  LYMPH NODES:  Lymph nodes in the neck are not enlarged. RESPIRATORY:  Lungs are clear, and breath sounds are equal bilaterally. Normal respiratory effort without pathologic use of accessory muscles. CARDIOVASCULAR: Heart is regular in rate and rhythm. GI: The abdomen is notable for midline infraumbilical scar, otherwise soft, nontender, and nondistended. There were no palpable masses. I did not appreciate hepatosplenomegaly. There were normal bowel sounds. MUSCULOSKELETAL:  Symmetrical muscle tone appreciated in all four extremities.    SKIN: Skin turgor is normal. No pathologic skin lesions appreciated.  NEUROLOGIC:  Motor and sensation appear grossly normal.  Cranial nerves are grossly without defect. PSYCH:  Alert and oriented to person, place and time. Affect is appropriate for situation.   Data Reviewed I have  personally reviewed what is currently available of the patient's imaging, recent labs and medical records.   Labs:      Latest Ref Rng & Units 08/28/2022    8:26 AM 08/29/2021    8:46 AM 11/10/2019   12:19 PM  CBC  WBC 4.0 - 10.5 K/uL 4.0  4.2  6.8   Hemoglobin 12.0 - 15.0 g/dL 13.3  13.6  13.7   Hematocrit 36.0 - 46.0 % 39.5  40.8  39.9   Platelets 150.0 - 400.0 K/uL 272.0  275.0  322.0         Latest Ref Rng & Units 10/24/2022    9:20 AM 08/28/2022    8:26 AM 08/29/2021    8:46 AM  CMP  Glucose 70 - 99 mg/dL   187  160   BUN 6 - 23 mg/dL   12  11   Creatinine 0.44 - 1.00 mg/dL 0.60  0.67  0.71   Sodium 135 - 145 mEq/L   140  143   Potassium 3.5 - 5.1 mEq/L   4.2  4.7   Chloride 96 - 112 mEq/L   107  108   CO2 19 - 32 mEq/L   25  28   Calcium 8.4 - 10.5 mg/dL   9.5  9.6   Total Protein 6.0 - 8.3 g/dL   6.8  7.6   Total Bilirubin 0.2 - 1.2 mg/dL   0.7  0.5   Alkaline Phos 39 - 117 U/L   89  73   AST 0 - 37 U/L   34  22   ALT 0 - 35 U/L   27  19      SURGICAL PATHOLOGY CASE: [id number] PATIENT: Rebecca Macias Surgical Pathology Report  Specimen Submitted: A. Rectum polyp; cold snare  Clinical History: Screening colonoscopy.  Diverticulosis, appendiceal orifice polyp, rectum polyp  DIAGNOSIS: A. RECTAL POLYP; COLD SNARE: - HYPERPLASTIC POLYP. - NEGATIVE FOR DYSPLASIA AND MALIGNANCY.      Imaging: Radiological review:  CLINICAL DATA:  Abdominal pain.  Scheduled for appendectomy.   EXAM: CT ABDOMEN AND PELVIS WITH CONTRAST   TECHNIQUE: Multidetector CT imaging of the abdomen and pelvis was performed using the standard protocol following bolus administration of intravenous contrast.   RADIATION DOSE REDUCTION: This exam was performed according to the departmental dose-optimization program which includes automated exposure control, adjustment of the mA and/or kV according to patient size and/or use of iterative reconstruction technique.   CONTRAST:  136m  OMNIPAQUE IOHEXOL 300 MG/ML  SOLN   COMPARISON:  CT abdomen and pelvis 10/20/2013   FINDINGS: Lower chest: No acute abnormality.   Hepatobiliary: No focal liver abnormality is seen. No gallstones, gallbladder wall thickening, or biliary dilatation.   Pancreas: Unremarkable. No pancreatic ductal dilatation or surrounding inflammatory changes.   Spleen: Normal in size without focal abnormality.   Adrenals/Urinary Tract: Adrenal glands are unremarkable. Kidneys are normal, without renal calculi, focal lesion, or hydronephrosis. Bladder is unremarkable.   Stomach/Bowel: Stomach is within normal limits. Appendix appears normal. No evidence of bowel wall thickening, distention, or inflammatory changes. There are scattered colonic diverticula. Rectosigmoid anastomosis is present.   Vascular/Lymphatic: There are atherosclerotic calcifications of the aorta. Aorta and IVC are normal in size. Again seen are splenic varices splenorenal shunt. No enlarged lymph nodes are identified.   Reproductive: Uterus and bilateral adnexa are unremarkable.   Other: No abdominal wall hernia or abnormality. No abdominopelvic ascites. Midline abdominal scar is present.   Musculoskeletal: Mild degenerative changes affect the spine.   IMPRESSION: 1. No acute localizing process in the abdomen or pelvis. 2. Colonic diverticulosis without evidence for diverticulitis. 3. Stable splenic varices with splenorenal shunt.   Aortic Atherosclerosis (ICD10-I70.0).     Electronically Signed   By: ARonney AstersM.D.   On: 10/25/2022 23:56   Assessment  Appendiceal polyp.     Patient Active Problem List    Diagnosis Date Noted   Encounter for screening colonoscopy     Hyperplastic rectal polyp     Adenoma  of appendix     Chronic low back pain with left-sided sciatica 10/10/2021   Non-insulin dependent type 2 diabetes mellitus ([place]) 07/10/2016   GERD (gastroesophageal reflux disease) 07/02/2016    Subclinical hyperthyroidism 06/12/2016   Palpitations 05/08/2015   Insomnia 05/08/2015   Symptoms, such as flushing, sleeplessness, headache, lack of concentration, associated with the menopause 05/08/2015   Thyroid nodule, cold 02/12/2015   Routine general medical examination at a health care facility 04/20/2014   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   Setting date for December 29, 2022   Prior discussion: Risks of anesthesia, bleeding, infection discussed with patient.  She understands these risks are not all exclusive.  Questions asked, reviewed surgical detail.  No guarantees ever expressed or implied.  She would like to defer surgery until after the holidays.

## 2022-12-29 NOTE — Anesthesia Postprocedure Evaluation (Signed)
Anesthesia Post Note  Patient: Rebecca Macias  Procedure(s) Performed: XI ROBOTIC LYSIS OF ADHESIONS AND APPENDECTOMY  Patient location during evaluation: PACU Anesthesia Type: General Level of consciousness: awake and alert Pain management: pain level controlled Vital Signs Assessment: post-procedure vital signs reviewed and stable Respiratory status: spontaneous breathing, nonlabored ventilation, respiratory function stable and patient connected to nasal cannula oxygen Cardiovascular status: blood pressure returned to baseline and stable Postop Assessment: no apparent nausea or vomiting Anesthetic complications: no   No notable events documented.   Last Vitals:  Vitals:   12/29/22 1100 12/29/22 1118  BP: (!) 107/56 135/73  Pulse: 89 88  Resp: 13 20  Temp: 36.7 C (!) 35.9 C  SpO2: 95% 97%    Last Pain:  Vitals:   12/29/22 1118  TempSrc: Temporal  PainSc: 3                  Precious Haws Patches Mcdonnell

## 2022-12-29 NOTE — Anesthesia Procedure Notes (Addendum)
Procedure Name: Intubation Date/Time: 12/29/2022 7:44 AM  Performed by: Biagio Borg, CRNAPre-anesthesia Checklist: Patient identified, Emergency Drugs available, Suction available and Patient being monitored Patient Re-evaluated:Patient Re-evaluated prior to induction Oxygen Delivery Method: Circle system utilized Preoxygenation: Pre-oxygenation with 100% oxygen Induction Type: IV induction Ventilation: Oral airway inserted - appropriate to patient size Laryngoscope Size: Mac and 3 Grade View: Grade I Tube type: Oral Tube size: 7.0 mm Number of attempts: 1 Airway Equipment and Method: Stylet and Oral airway Placement Confirmation: ETT inserted through vocal cords under direct vision, positive ETCO2 and breath sounds checked- equal and bilateral Secured at: 23 cm Tube secured with: Tape Dental Injury: Teeth and Oropharynx as per pre-operative assessment

## 2022-12-29 NOTE — Op Note (Signed)
Robotic assisted laparoscopic appendectomy, laparoscopic lysis of adhesions  Pre-operative Diagnosis: Appendiceal polyp  Post-operative Diagnosis: same.    Surgeon: Ronny Bacon, M.D., FACS  Anesthesia: General  Findings: Small intraluminal polyp at orifice of appendix.  Estimated Blood Loss: 15 mL         Specimens: Appendix          Complications: none              Procedure Details  The patient was seen again in the Holding Room. The benefits, complications, treatment options, and expected outcomes were discussed with the patient. The risks of bleeding, infection, recurrence of symptoms, failure to resolve symptoms, unanticipated injury, prosthetic placement, prosthetic infection, any of which could require further surgery were reviewed with the patient. The likelihood of improving the patient's symptoms with return to their baseline status is good.  The patient and/or family concurred with the proposed plan, giving informed consent.  The patient was taken to Operating Room, identified and the procedure verified.    Prior to the induction of general anesthesia, antibiotic prophylaxis was administered. VTE prophylaxis was in place.  General anesthesia was then administered and tolerated well. After the induction, the patient was positioned in the supine position and the abdomen was prepped with  Chloraprep and draped in the sterile fashion.  A Time Out was held and the above information confirmed.  After local infiltration of half percent Marcaine with epinephrine, stab incision was made left upper quadrant.  Just below the costal margin approximately midclavicular line the Veress needle is passed with sensation of the layers to penetrate the abdominal wall and into the peritoneum.  Saline drop test is confirmed peritoneal placement.  Insufflation is initiated with carbon dioxide to pressures of 15 mmHg.  With local anesthetic infiltration, a left lower quadrant incision is made, and  an optical 12 mm trocar is passed into the peritoneal cavity under direct visualization.  An additional 8.5 mm robotic trochars are placed in the suprapubic area and in the right and left abdominal wall under direct visualization.  The suprapubic trocar was withheld until sufficient lysis of adhesions was completed utilizing laparoscopic shears, electrocautery/surgery and grasper.  With adequate adhesiolysis involving 30 minutes of operative time, we were able to place a suprapubic robotic trocar. Using a Tips up, & force bipolar graspers with monopolar scissors I proceeded with dissecting out the soft tissues adjacent to the cecum and appendix to fully identify the appendix, and mobilize it to the cecal junction.  I also mobilized the distal ileum to its definitive junction with the cecum.  This was to ensure that the appendiceal staple line did not compromise the ileocecal valve.  The mesoappendix was carefully divided utilizing bipolar cautery, monopolar cautery and scissors.   We then undocked the robot and proceeded with completing the procedure laparoscopically. I used a 45 mm white load laparoscopic stapler to complete the division of the appendix at the cecum.  We then placed the appendix in a retrieval bag and withdrew it out the largest port site. We closed the largest port site utilizing PMI and cone with a 0 Vicryl under direct visualization.  The abdomen was then desufflated and the trochars removed. Incisions were then irrigated and closed with subcuticulars of 4-0 Monocryl.  Skin sealed with Dermabond.  Patient tolerated procedure well.    Ronny Bacon M.D., [facility] 12/29/2022 10:07 AM

## 2022-12-29 NOTE — Interval H&P Note (Signed)
History and Physical Interval Note:  12/29/2022 7:26 AM  Rebecca Macias  has presented today for surgery, with the diagnosis of adenoma of appendix.  The various methods of treatment have been discussed with the patient and family. After consideration of risks, benefits and other options for treatment, the patient has consented to  Procedure(s): XI ROBOTIC PARTIAL CECECTOMY (N/A) as a surgical intervention.  The patient's history has been reviewed, patient examined, no change in status, stable for surgery.  I have reviewed the patient's chart and labs.  Questions were answered to the patient's satisfaction.     Ronny Bacon

## 2022-12-29 NOTE — Discharge Instructions (Signed)
AMBULATORY SURGERY  ?DISCHARGE INSTRUCTIONS ? ? ?The drugs that you were given will stay in your system until tomorrow so for the next 24 hours you should not: ? ?Drive an automobile ?Make any legal decisions ?Drink any alcoholic beverage ? ? ?You may resume regular meals tomorrow.  Today it is better to start with liquids and gradually work up to solid foods. ? ?You may eat anything you prefer, but it is better to start with liquids, then soup and crackers, and gradually work up to solid foods. ? ? ?Please notify your doctor immediately if you have any unusual bleeding, trouble breathing, redness and pain at the surgery site, drainage, fever, or pain not relieved by medication. ? ? ? ?Additional Instructions: ? ? ? ?Please contact your physician with any problems or Same Day Surgery at [phone], Monday through Friday 6 am to 4 pm, or [place] [facility] Main number at [phone].  ?

## 2022-12-29 NOTE — Progress Notes (Signed)
Zofran for possible post-op nausea.

## 2022-12-29 NOTE — Transfer of Care (Signed)
Immediate Anesthesia Transfer of Care Note  Patient: Rebecca Macias  Procedure(s) Performed: XI ROBOTIC LYSIS OF ADHESIONS AND APPENDECTOMY  Patient Location: PACU  Anesthesia Type:General  Level of Consciousness: drowsy  Airway & Oxygen Therapy: Patient Spontanous Breathing and Patient connected to face mask oxygen  Post-op Assessment: Report given to RN and Post -op Vital signs reviewed and stable  Post vital signs: Reviewed and stable  Last Vitals:  Vitals Value Taken Time  BP 141/71 12/29/22 1007  Temp    Pulse 86 12/29/22 1010  Resp 19 12/29/22 1010  SpO2 99 % 12/29/22 1010  Vitals shown include unvalidated device data.  Last Pain:  Vitals:   12/29/22 0656  TempSrc: Temporal  PainSc: 0-No pain         Complications: No notable events documented.

## 2022-12-29 NOTE — Anesthesia Preprocedure Evaluation (Signed)
Anesthesia Evaluation  Patient identified by MRN, date of birth, ID band Patient awake    Reviewed: Allergy & Precautions, NPO status , Patient's Chart, lab work & pertinent test results  History of Anesthesia Complications Negative for: history of anesthetic complications  Airway Mallampati: III  TM Distance: <3 FB Neck ROM: full    Dental  (+) Chipped   Pulmonary neg shortness of breath, former smoker   Pulmonary exam normal        Cardiovascular Exercise Tolerance: Good (-) angina (-) Past MI negative cardio ROS Normal cardiovascular exam     Neuro/Psych  Neuromuscular disease  negative psych ROS   GI/Hepatic Neg liver ROS,GERD  Controlled,,  Endo/Other  diabetes, Type 2    Renal/GU      Musculoskeletal   Abdominal   Peds  Hematology negative hematology ROS (+)   Anesthesia Other Findings Past Medical History: No date: H/O gestational diabetes mellitus, not currently pregnant No date: Ovarian cyst No date: Palpitations 08/2012: Sigmoid diverticulitis No date: Subclinical hypothyroidism No date: Type 2 diabetes mellitus ([place])  Past Surgical History: 2009: BLADDER SUSPENSION No date: Colectomy; Left     Comment:  likely sigmoid, open.  for diverticulitis. 10/01/2022: COLONOSCOPY WITH PROPOFOL; N/A     Comment:  Procedure: COLONOSCOPY WITH PROPOFOL;  Surgeon: Lin Landsman, MD;  Location: [facility] ENDOSCOPY;  Service:               Gastroenterology;  Laterality: N/A; No date: SMALL INTESTINE SURGERY No date: TUBAL LIGATION  BMI    Body Mass Index: 33.28 kg/m      Reproductive/Obstetrics negative OB ROS                             Anesthesia Physical Anesthesia Plan  ASA: 3  Anesthesia Plan: General ETT   Post-op Pain Management:    Induction: Intravenous  PONV Risk Score and Plan: Ondansetron, Dexamethasone, Midazolam and Treatment may vary due to  age or medical condition  Airway Management Planned: Oral ETT  Additional Equipment:   Intra-op Plan:   Post-operative Plan: Extubation in OR  Informed Consent: I have reviewed the patients History and Physical, chart, labs and discussed the procedure including the risks, benefits and alternatives for the proposed anesthesia with the patient or authorized representative who has indicated his/her understanding and acceptance.     Dental Advisory Given  Plan Discussed with: Anesthesiologist, CRNA and Surgeon  Anesthesia Plan Comments: (Patient consented for risks of anesthesia including but not limited to:  - adverse reactions to medications - damage to eyes, teeth, lips or other oral mucosa - nerve damage due to positioning  - sore throat or hoarseness - Damage to heart, brain, nerves, lungs, other parts of body or loss of life  Patient voiced understanding.)       Anesthesia Quick Evaluation

## 2022-12-30 ENCOUNTER — Encounter: Payer: Self-pay | Admitting: Surgery

## 2022-12-30 LAB — SURGICAL PATHOLOGY

## 2023-01-01 ENCOUNTER — Ambulatory Visit (INDEPENDENT_AMBULATORY_CARE_PROVIDER_SITE_OTHER): Payer: Commercial Managed Care - PPO | Admitting: Surgery

## 2023-01-01 ENCOUNTER — Encounter: Payer: Self-pay | Admitting: Surgery

## 2023-01-01 VITALS — BP 150/78 | HR 98 | Temp 98.4°F | Ht 65.0 in | Wt 204.2 lb

## 2023-01-01 DIAGNOSIS — D121 Benign neoplasm of appendix: Secondary | ICD-10-CM

## 2023-01-01 DIAGNOSIS — G8929 Other chronic pain: Secondary | ICD-10-CM

## 2023-01-01 DIAGNOSIS — Z09 Encounter for follow-up examination after completed treatment for conditions other than malignant neoplasm: Secondary | ICD-10-CM

## 2023-01-01 DIAGNOSIS — R1031 Right lower quadrant pain: Secondary | ICD-10-CM

## 2023-01-01 MED ORDER — OXYCODONE-ACETAMINOPHEN 5-325 MG PO TABS: 1.0000 | ORAL_TABLET | ORAL | 0 refills | Status: DC | PRN

## 2023-01-01 MED ORDER — PREGABALIN 50 MG PO CAPS: 50.0000 mg | ORAL_CAPSULE | Freq: Three times a day (TID) | ORAL | 0 refills | Status: DC

## 2023-01-01 NOTE — Patient Instructions (Addendum)
Your CT is scheduled for 01/08/2023 1 pm (arrive by 10:30 am to drink contrast) at [facility]. Nothing to eat or drink 4 hours prior.  Please get labs drawn at [facility]. You do not need an appointment.   Please pick up your prescriptions at your pharmacy.  If you have any concerns or questions, please feel free to call our office. See follow up appointment below.   Laparoscopic Lysis of Abdominal Adhesions, Care After The following information offers guidance on how to care for yourself after your procedure. Your health care provider may also give you more specific instructions. If you have problems or questions, contact your health care provider. What can I expect after the procedure? After the procedure, it is common to have some pain around your incisions. Follow these instructions at home: Medicines Take over-the-counter and prescription medicines only as told by your health care provider. Ask your health care provider if the medicine prescribed to you: Requires you to avoid driving or using machinery. Can cause constipation. You may need to take these actions to prevent or treat constipation: Take over-the-counter or prescription medicines. Eat foods that are high in fiber, such as beans, whole grains, and fresh fruits and vegetables. Limit foods that are high in fat and processed sugars, such as fried or sweet foods. Incision care  Follow instructions from your health care provider about how to take care of your incisions. Make sure you: Wash your hands with soap and water for at least 20 seconds before and after you change your bandage (dressing). If soap and water are not available, use hand sanitizer. Change your dressing as told by your health care provider. Leave stitches (sutures), skin glue, or adhesive strips in place. These skin closures may need to stay in place for 2 weeks or longer. If adhesive strip edges start to loosen and curl up, you may trim the loose edges. Do not remove  adhesive strips completely unless your health care provider tells you to do that. Check your incision areas every day for signs of infection. Check for: More redness, swelling, or pain. Fluid or blood. Warmth. Pus or a bad smell. Activity Rest as told by your health care provider. Avoid sitting for a long time without moving. Get up to take short walks every 1-2 hours. This is important to improve blood flow and breathing. Ask for help if you feel weak or unsteady. Do not lift anything that is heavier than 10 lb (4.5 kg), or the limit that you are told, until your health care provider says that it is safe. Do exercises as told by your health care provider. Return to your normal activities as told by your health care provider. Ask your health care provider what activities are safe for you. Ask your health care provider when it is safe to drive. General instructions Eat only a little at a time at first. Start with liquids. As your appetite improves, gradually return to eating solid foods. Drink enough fluid to keep your urine pale yellow. Do not take baths, swim, or use a hot tub until your health care provider approves. Ask your health care provider if you may take showers. You may only be allowed to take sponge baths. Keep all follow-up visits. This is important. Contact a health care provider if: You have a fever or chills. You have more redness, swelling, or pain at the site of your incisions. You have fluid or blood coming from your incisions. Your incisions feel warm to the touch.  You have pus or a bad smell coming from your incisions. You have a cough. You have nausea and vomiting. You are unable to have a bowel movement or pass gas. Get help right away if: You have severe pain in your abdomen or your chest. You have problems with breathing. You have nausea or vomiting that is severe or keeps coming back. Your incision starts to break open. These symptoms may represent a serious  problem that is an emergency. Do not wait to see if the symptoms will go away. Get medical help right away. Call your local emergency services (911 in the [place] Do not drive yourself to the hospital. Summary After laparoscopic lysis of abdominal adhesions, it is common to have some pain around your incisions. Check your incision areas every day for signs of infection. Watch for redness, worsening pain, warmth, and drainage. Be sure to keep all follow-up visits. This information is not intended to replace advice given to you by your health care provider. Make sure you discuss any questions you have with your health care provider. Document Revised: 08/03/2020 Document Reviewed: 08/03/2020 Elsevier Patient Education  [place].

## 2023-01-01 NOTE — Progress Notes (Signed)
[facility] POST-OP OFFICE VISIT  01/01/2023  HPI: Rebecca Macias is a 62 y.o. female 3 days s/p robotic lysis of adhesions with appendectomy.  She reports her pain is 10 out of 10, exacerbated by moving.  She reports she was instructed/researched that she should not use her abdominal muscles postop, so she has been essentially immobile avoiding movement.  She reports avoiding utilizing her pain medications for first postoperative day.  She reports nausea but denies vomiting, fevers or chills.  She reports she has had bowel activity.  She reports now that the pain medications provide no relief.  And complains primarily of her to lowest laparoscopic/robotic incisions in the left lower quadrant.  Vital signs: BP (!) 150/78   Pulse 98   Temp 98.4 F (36.9 C) (Oral)   Ht '5\' 5"'$  (1.651 m)   Wt 204 lb 3.2 oz (92.6 kg)   LMP 11/03/2012   SpO2 96%   BMI 33.98 kg/m    Physical Exam: Constitutional: She does not appear toxic whatsoever.  Once we discussed that she could actually get up and use and move around and we want her to that she was more than able. Abdomen: When distracted there is no evidence of any abdominal incisional mass, induration or fullness.  All her incisions are clean dry and intact.  Her right lower quadrant and mid abdomen are entirely nontender and without evidence of guarding.  When distracted she has no guarding around her incisions either. Skin: No evidence of erythema, ecchymosis.  Assessment/Plan: This is a 62 y.o. female 3 days s/p laparoscopic lysis of adhesions with robotic assisted appendectomy.  Pathology reviewed and benign appendiceal polyp.  Patient Active Problem List   Diagnosis Date Noted   Postoperative adhesions 12/29/2022   Acute sinusitis 12/18/2022   Encounter for screening colonoscopy    Hyperplastic rectal polyp    Adenoma of appendix    Chronic low back pain with left-sided sciatica 10/10/2021   Non-insulin dependent type 2  diabetes mellitus ([place]) 07/10/2016   GERD (gastroesophageal reflux disease) 07/02/2016   Subclinical hyperthyroidism 06/12/2016   Palpitations 05/08/2015   Insomnia 05/08/2015   Symptoms, such as flushing, sleeplessness, headache, lack of concentration, associated with the menopause 05/08/2015   Thyroid nodule, cold 02/12/2015   Routine general medical examination at a health care facility 04/20/2014   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  She already has Zofran.  Reassurance is given that she should be ambulating and not bedridden.  I believe she will do better the days to come.  Assured her we remain readily available should she continue to have issues or become febrile, developed nausea or vomiting. We did also schedule CT imaging just to ensure that there is no other issue present.   Ronny Bacon M.D., FACS 01/01/2023, 4:15 PM

## 2023-01-08 ENCOUNTER — Ambulatory Visit
Admission: RE | Admit: 2023-01-08 | Discharge: 2023-01-08 | Disposition: A | Discharge status: Home / Self Care | Payer: Commercial Managed Care - PPO | Source: Ambulatory Visit | Attending: Surgery | Admitting: Surgery

## 2023-01-08 DIAGNOSIS — G8929 Other chronic pain: Secondary | ICD-10-CM | POA: Insufficient documentation

## 2023-01-08 DIAGNOSIS — R1031 Right lower quadrant pain: Secondary | ICD-10-CM | POA: Diagnosis present

## 2023-01-08 MED ORDER — IOHEXOL 350 MG/ML SOLN
75.0000 mL | Freq: Once | INTRAVENOUS | Status: AC | PRN
  Administered 2023-01-08: 75 mL via INTRAVENOUS

## 2023-01-12 ENCOUNTER — Telehealth: Payer: Self-pay | Admitting: *Deleted

## 2023-01-12 NOTE — Telephone Encounter (Signed)
Faxed FMLA to [employer] at [phone] °

## 2023-01-12 NOTE — Progress Notes (Unsigned)
[facility] POST-OP OFFICE VISIT  01/13/2023  HPI: Rebecca Macias is a 62 y.o. female 15 days s/p Robotic appendectomy for benign polyp.   Much better now.  No issues, f/c, n/v, d/c.  Pain much eased off to minimal.   Vital signs: BP 127/83   Pulse (!) 105   Temp 98.9 F (37.2 C) (Oral)   Ht '5\' 5"'$  (1.651 m)   Wt 202 lb (91.6 kg)   LMP 11/03/2012   SpO2 98%   BMI 33.61 kg/m    Physical Exam: Constitutional: appears well, no distress, non-toxic Abdomen: soft and non-tender  Skin: incisions c/d/i  Assessment/Plan: This is a 62 y.o. female 15 days s/p robotic appy for polyp.   Patient Active Problem List   Diagnosis Date Noted   Postoperative adhesions 12/29/2022   Acute sinusitis 12/18/2022   Encounter for screening colonoscopy    Hyperplastic rectal polyp    Adenoma of appendix    Chronic low back pain with left-sided sciatica 10/10/2021   Non-insulin dependent type 2 diabetes mellitus ([place]) 07/10/2016   GERD (gastroesophageal reflux disease) 07/02/2016   Subclinical hyperthyroidism 06/12/2016   Palpitations 05/08/2015   Insomnia 05/08/2015   Symptoms, such as flushing, sleeplessness, headache, lack of concentration, associated with the menopause 05/08/2015   Thyroid nodule, cold 02/12/2015   Routine general medical examination at a health care facility 04/20/2014   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  F/u as needed.    Ronny Bacon M.D., FACS 01/13/2023, 9:44 AM

## 2023-01-13 ENCOUNTER — Encounter: Payer: Self-pay | Admitting: Surgery

## 2023-01-13 ENCOUNTER — Ambulatory Visit (INDEPENDENT_AMBULATORY_CARE_PROVIDER_SITE_OTHER): Payer: Commercial Managed Care - PPO | Admitting: Surgery

## 2023-01-13 VITALS — BP 127/83 | HR 105 | Temp 98.9°F | Ht 65.0 in | Wt 202.0 lb

## 2023-01-13 DIAGNOSIS — Z09 Encounter for follow-up examination after completed treatment for conditions other than malignant neoplasm: Secondary | ICD-10-CM

## 2023-01-13 DIAGNOSIS — D121 Benign neoplasm of appendix: Secondary | ICD-10-CM

## 2023-01-13 NOTE — Patient Instructions (Signed)
If you have any concerns or questions, please feel free to call our office. Follow up as needed.   Laparoscopic Lysis of Abdominal Adhesions, Care After The following information offers guidance on how to care for yourself after your procedure. Your health care provider may also give you more specific instructions. If you have problems or questions, contact your health care provider. What can I expect after the procedure? After the procedure, it is common to have some pain around your incisions. Follow these instructions at home: Medicines Take over-the-counter and prescription medicines only as told by your health care provider. Ask your health care provider if the medicine prescribed to you: Requires you to avoid driving or using machinery. Can cause constipation. You may need to take these actions to prevent or treat constipation: Take over-the-counter or prescription medicines. Eat foods that are high in fiber, such as beans, whole grains, and fresh fruits and vegetables. Limit foods that are high in fat and processed sugars, such as fried or sweet foods. Incision care  Follow instructions from your health care provider about how to take care of your incisions. Make sure you: Wash your hands with soap and water for at least 20 seconds before and after you change your bandage (dressing). If soap and water are not available, use hand sanitizer. Change your dressing as told by your health care provider. Leave stitches (sutures), skin glue, or adhesive strips in place. These skin closures may need to stay in place for 2 weeks or longer. If adhesive strip edges start to loosen and curl up, you may trim the loose edges. Do not remove adhesive strips completely unless your health care provider tells you to do that. Check your incision areas every day for signs of infection. Check for: More redness, swelling, or pain. Fluid or blood. Warmth. Pus or a bad smell. Activity Rest as told by your  health care provider. Avoid sitting for a long time without moving. Get up to take short walks every 1-2 hours. This is important to improve blood flow and breathing. Ask for help if you feel weak or unsteady. Do not lift anything that is heavier than 10 lb (4.5 kg), or the limit that you are told, until your health care provider says that it is safe. Do exercises as told by your health care provider. Return to your normal activities as told by your health care provider. Ask your health care provider what activities are safe for you. Ask your health care provider when it is safe to drive. General instructions Eat only a little at a time at first. Start with liquids. As your appetite improves, gradually return to eating solid foods. Drink enough fluid to keep your urine pale yellow. Do not take baths, swim, or use a hot tub until your health care provider approves. Ask your health care provider if you may take showers. You may only be allowed to take sponge baths. Keep all follow-up visits. This is important. Contact a health care provider if: You have a fever or chills. You have more redness, swelling, or pain at the site of your incisions. You have fluid or blood coming from your incisions. Your incisions feel warm to the touch. You have pus or a bad smell coming from your incisions. You have a cough. You have nausea and vomiting. You are unable to have a bowel movement or pass gas. Get help right away if: You have severe pain in your abdomen or your chest. You have problems with  breathing. You have nausea or vomiting that is severe or keeps coming back. Your incision starts to break open. These symptoms may represent a serious problem that is an emergency. Do not wait to see if the symptoms will go away. Get medical help right away. Call your local emergency services (911 in the [place] Do not drive yourself to the hospital. Summary After laparoscopic lysis of abdominal adhesions, it is  common to have some pain around your incisions. Check your incision areas every day for signs of infection. Watch for redness, worsening pain, warmth, and drainage. Be sure to keep all follow-up visits. This information is not intended to replace advice given to you by your health care provider. Make sure you discuss any questions you have with your health care provider. Document Revised: 08/03/2020 Document Reviewed: 08/03/2020 Elsevier Patient Education  [place].

## 2023-01-17 ENCOUNTER — Other Ambulatory Visit: Payer: Self-pay | Admitting: Internal Medicine

## 2023-01-21 LAB — POCT I-STAT CREATININE: Creatinine, Ser: 0.7 mg/dL (ref 0.44–1.00)

## 2023-02-26 ENCOUNTER — Ambulatory Visit: Payer: Commercial Managed Care - PPO | Admitting: Primary Care

## 2023-06-18 ENCOUNTER — Encounter: Payer: Self-pay | Admitting: Primary Care

## 2023-06-18 ENCOUNTER — Ambulatory Visit (INDEPENDENT_AMBULATORY_CARE_PROVIDER_SITE_OTHER): Payer: Commercial Managed Care - PPO | Admitting: Primary Care

## 2023-06-18 VITALS — BP 124/72 | HR 74 | Temp 98.0°F | Ht 65.0 in | Wt 202.2 lb

## 2023-06-18 DIAGNOSIS — E119 Type 2 diabetes mellitus without complications: Secondary | ICD-10-CM

## 2023-06-18 DIAGNOSIS — T7840XA Allergy, unspecified, initial encounter: Secondary | ICD-10-CM | POA: Insufficient documentation

## 2023-06-18 DIAGNOSIS — T7840XS Allergy, unspecified, sequela: Secondary | ICD-10-CM | POA: Diagnosis not present

## 2023-06-18 LAB — POCT GLYCOSYLATED HEMOGLOBIN (HGB A1C): Hemoglobin A1C: 6.6 % — AB (ref 4.0–5.6)

## 2023-06-18 MED ORDER — OZEMPIC (0.25 OR 0.5 MG/DOSE) 2 MG/3ML ~~LOC~~ SOPN: 0.5000 mg | PEN_INJECTOR | SUBCUTANEOUS | 0 refills | Status: DC

## 2023-06-18 MED ORDER — METFORMIN HCL ER 500 MG PO TB24: 500.0000 mg | ORAL_TABLET | Freq: Every day | ORAL | 0 refills | Status: DC

## 2023-06-18 NOTE — Assessment & Plan Note (Signed)
Checking food allergy panel today for evaluation of shell fish allergy. She may need an EpiPen, discussed this today.  Avoid shell fish.  Await results.

## 2023-06-18 NOTE — Progress Notes (Signed)
Subjective:    Patient ID: Rebecca Macias, female    DOB: [birth year], 62 y.o.   MRN: [id number]  HPI  Rebecca Macias is a very pleasant 62 y.o. female with a history of type 2 diabetes, palpitations, subclinical hyperthyroidism who presents today for follow up of diabetes and to discuss allergic reaction.   1) Type 2 Diabetes: Current medications include: Metformin 500 mg twice daily, Ozempic 0.5 mg weekly.   She began her Ozempic 0.5 mg 2 weeks ago.  She switched her metformin to once daily when she began Ozempic because the second dose "makes me feel bad". She denies diarrhea, she does notice constipation.   She is checking her blood glucose rarely.  Last A1C: 6.2 in January 2024. 6.6 today Last Eye Exam: Due Last Foot Exam: Up-to-date Pneumonia Vaccination: 2018 Urine Microalbumin: Up-to-date Statin: Rosuvastatin  Dietary changes since last visit: Trying to cut back on sweets, pork, and fried food   Exercise: Active at work  [employer] from Last 3 Encounters:  06/18/23 202 lb 3.2 oz (91.7 kg)  01/13/23 202 lb (91.6 kg)  01/01/23 204 lb 3.2 oz (92.6 kg)     2) Allergic Reaction: She believes she may have an allergy to shell fish. About 1 year ago she had some imitation crab dip and noticed tingling to her lips. A few months later she ate some shrimp and noticed itching to her skin and tingling to her lips. A few months later she was just around shrimp, didn't eat the shrimp, but felt her body start to itch.   She is able to eat baked fish without symptoms.   She denies throat closure, wheezing, shortness of breath. Her last consumption of shell fish was about 7 months ago.   Review of Systems  Respiratory:  Negative for shortness of breath.   Cardiovascular:  Negative for chest pain.  Gastrointestinal:  Positive for constipation. Negative for diarrhea.  Neurological:  Negative for numbness.         Past Medical History:  Diagnosis Date   H/O gestational  diabetes mellitus, not currently pregnant    Ovarian cyst    Palpitations    Sigmoid diverticulitis 08/2012   Subclinical hypothyroidism    Type 2 diabetes mellitus (HCC)     Social History   Socioeconomic History   Marital status: Divorced    Spouse name: Not on file   Number of children: Not on file   Years of education: Not on file   Highest education level: Some college, no degree  Occupational History   Not on file  Tobacco Use   Smoking status: Former    Current packs/day: 0.00    Types: Cigarettes, Cigars    Quit date: 09/19/2011    Years since quitting: 11.7   Smokeless tobacco: Never  Vaping Use   Vaping status: Former  Substance and Sexual Activity   Alcohol use: No   Drug use: No   Sexual activity: Not on file  Other Topics Concern   Not on file  Social History Narrative   Works in Audiological scientist at [employer].   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   Social Determinants of Health   Financial Resource Strain: Low Risk  (06/17/2023)   Overall Financial Resource Strain (CARDIA)    Difficulty of Paying Living Expenses: Not very hard  Food Insecurity: No Food Insecurity (06/17/2023)   Hunger Vital Sign    Worried About Running Out of Food in  the Last Year: Never true    Ran Out of Food in the Last Year: Never true  Transportation Needs: No Transportation Needs (06/17/2023)   PRAPARE - Administrator, Civil Service (Medical): No    Lack of Transportation (Non-Medical): No  Physical Activity: Sufficiently Active (06/17/2023)   Exercise Vital Sign    Days of Exercise per Week: 7 days    Minutes of Exercise per Session: 60 min  Stress: Stress Concern Present (06/17/2023)   [employer] of Occupational Health - Occupational Stress Questionnaire    Feeling of Stress : To some extent  Social Connections: Moderately Integrated (06/17/2023)   Social Connection and Isolation Panel [NHANES]    Frequency of Communication with Friends and Family:  More than three times a week    Frequency of Social Gatherings with Friends and Family: Once a week    Attends Religious Services: More than 4 times per year    Active Member of [employer] or Organizations: Yes    Attends Banker Meetings: More than 4 times per year    Marital Status: Divorced  Catering manager Violence: Not on file    Past Surgical History:  Procedure Laterality Date   BLADDER SUSPENSION  2009   Colectomy Left    likely sigmoid, open.  for diverticulitis.   COLONOSCOPY WITH PROPOFOL N/A 10/01/2022   Procedure: COLONOSCOPY WITH PROPOFOL;  Surgeon: Toney Reil, MD;  Location: [facility] ENDOSCOPY;  Service: Gastroenterology;  Laterality: N/A;   SMALL INTESTINE SURGERY     TUBAL LIGATION     XI ROBOTIC LAPAROSCOPIC ASSISTED APPENDECTOMY N/A 12/29/2022   Procedure: XI ROBOTIC LYSIS OF ADHESIONS AND APPENDECTOMY;  Surgeon: Campbell Lerner, MD;  Location: [facility] ORS;  Service: General;  Laterality: N/A;    Family History  Problem Relation Age of Onset   Heart disease Father    Kidney disease Father    Diabetes Father    Diabetes Maternal Grandmother    Diabetes Paternal Grandmother    Breast cancer Neg Hx     Allergies  Allergen Reactions   Flagyl [Metronidazole] Itching    Current Outpatient Medications on File Prior to Visit  Medication Sig Dispense Refill   atenolol (TENORMIN) 25 MG tablet Take 1 tablet (25 mg total) by mouth daily. For palpitations. 90 tablet 2   Biotin 1000 MCG tablet Take 1,000 mcg by mouth daily.     blood glucose meter kit and supplies KIT Dispense based on patient and insurance preference. Use up to four times daily as directed. (FOR ICD-9 250.00, 250.01). 1 each 0   cyanocobalamin 500 MCG tablet Take 1,000 mcg by mouth daily. Reported on 06/12/2016     docusate sodium (COLACE) 100 MG capsule Take 200 mg by mouth daily. Reported on 06/12/2016     magnesium 30 MG tablet Take 30 mg by mouth 2 (two) times daily.     rosuvastatin  (CRESTOR) 5 MG tablet TAKE 1 TABLET (5 MG TOTAL) BY MOUTH EVERY EVENING. FOR CHOLESTEROL. 90 tablet 2   acetaminophen (TYLENOL) 500 MG tablet Take 1,000 mg by mouth every 6 (six) hours as needed for moderate pain. (Patient not taking: Reported on 06/18/2023)     No current facility-administered medications on file prior to visit.    BP 124/72   Pulse 74   Temp 98 F (36.7 C) (Temporal)   Ht 5\' 5"  (1.651 m)   Wt 202 lb 3.2 oz (91.7 kg)   LMP 11/03/2012   SpO2  96%   BMI 33.65 kg/m  Objective:   Physical Exam Cardiovascular:     Rate and Rhythm: Normal rate and regular rhythm.  Pulmonary:     Effort: Pulmonary effort is normal.     Breath sounds: Normal breath sounds.  Musculoskeletal:     Cervical back: Neck supple.  Skin:    General: Skin is warm and dry.           Assessment & Plan:  Non-insulin dependent type 2 diabetes mellitus (HCC) Assessment & Plan: Deteriorated with A1C of 6.6, still overall controlled.  Will switch to metformin XR 500 mg daily.  Continue Ozempic 0.5 mg weekly for now.  Follow up in 3 months.   Orders: -     POCT glycosylated hemoglobin (Hb A1C) -     Ozempic (0.25 or 0.5 MG/DOSE); Inject 0.5 mg into the skin once a week. for diabetes.  Dispense: 9 mL; Refill: 0 -     metFORMIN HCl ER; Take 1 tablet (500 mg total) by mouth daily with breakfast. for diabetes.  Dispense: 90 tablet; Refill: 0  Allergic reaction, sequela Assessment & Plan: Checking food allergy panel today for evaluation of shell fish allergy. She may need an EpiPen, discussed this today.  Avoid shell fish.  Await results.   Orders: -     Food Allergy Profile        Doreene Nest, NP

## 2023-06-18 NOTE — Assessment & Plan Note (Addendum)
Deteriorated with A1C of 6.6, still overall controlled.  Will switch to metformin XR 500 mg daily.  Continue Ozempic 0.5 mg weekly for now.  Follow up in 3 months.

## 2023-06-18 NOTE — Patient Instructions (Addendum)
We changed your Metformin to the ER version. Take 1 tablet by mouth once daily for diabetes.  Continue Ozempic 0.5 mg weekly for diabetes.  Stop by the lab prior to leaving today. I will notify you of your results once received.   It was a pleasure to see you today!

## 2023-06-19 LAB — FOOD ALLERGY PROFILE
Allergen, Salmon, f41: <0.1 kU/L
Almonds: <0.1 kU/L
CLASS: 0
CLASS: 0
CLASS: 0
CLASS: 0
CLASS: 0
CLASS: 0
CLASS: 0
CLASS: 0
CLASS: 0
CLASS: 0
CLASS: 0
Cashew IgE: <0.1 kU/L
Class: 0
Class: 0
Class: 0
Class: 0
Egg White IgE: <0.1 kU/L
Fish Cod: <0.1 kU/L
Hazelnut: <0.1 kU/L
Milk IgE: <0.1 kU/L
Peanut IgE: <0.1 kU/L
Scallop IgE: <0.1 kU/L
Sesame Seed f10: <0.1 kU/L
Shrimp IgE: <0.1 kU/L
Soybean IgE: <0.1 kU/L
Tuna IgE: <0.1 kU/L
Walnut: <0.1 kU/L
Wheat IgE: <0.1 kU/L

## 2023-06-19 LAB — INTERPRETATION:

## 2023-06-29 ENCOUNTER — Other Ambulatory Visit: Payer: Self-pay | Admitting: Primary Care

## 2023-06-29 DIAGNOSIS — E119 Type 2 diabetes mellitus without complications: Secondary | ICD-10-CM

## 2023-06-29 NOTE — Telephone Encounter (Signed)
She has been taking Ozempic 0.5 mg weekly for diabetes. Why all of a sudden the need for PA?

## 2023-07-02 ENCOUNTER — Other Ambulatory Visit (HOSPITAL_COMMUNITY): Payer: Self-pay

## 2023-07-02 NOTE — Telephone Encounter (Signed)
Noted  

## 2023-07-02 NOTE — Telephone Encounter (Signed)
Per test claim, no PA is required. I believe they attempted to run the medication for an 84 day supply at pharmacy level, which isn't covered (see below)  *Test claim for 28 day supply   *Test claim for 84 day supply

## 2023-07-02 NOTE — Telephone Encounter (Signed)
Spoke to pt's preferred pharmacy, they are getting a one month supply of ozempic ready for her.

## 2023-07-23 ENCOUNTER — Encounter (INDEPENDENT_AMBULATORY_CARE_PROVIDER_SITE_OTHER): Payer: Self-pay

## 2023-07-24 ENCOUNTER — Other Ambulatory Visit: Payer: Self-pay | Admitting: Primary Care

## 2023-07-24 DIAGNOSIS — E785 Hyperlipidemia, unspecified: Secondary | ICD-10-CM

## 2023-07-24 DIAGNOSIS — R002 Palpitations: Secondary | ICD-10-CM

## 2023-08-13 ENCOUNTER — Ambulatory Visit: Payer: Commercial Managed Care - PPO | Admitting: Primary Care

## 2023-09-02 ENCOUNTER — Other Ambulatory Visit (HOSPITAL_COMMUNITY)
Admission: RE | Admit: 2023-09-02 | Discharge: 2023-09-02 | Disposition: A | Discharge status: Home / Self Care | Payer: Commercial Managed Care - PPO | Source: Ambulatory Visit | Attending: Primary Care | Admitting: Primary Care

## 2023-09-02 ENCOUNTER — Ambulatory Visit: Payer: Commercial Managed Care - PPO | Admitting: Primary Care

## 2023-09-02 ENCOUNTER — Encounter: Payer: Self-pay | Admitting: Primary Care

## 2023-09-02 VITALS — BP 122/64 | HR 88 | Temp 97.9°F | Ht 65.0 in | Wt 202.0 lb

## 2023-09-02 DIAGNOSIS — Z124 Encounter for screening for malignant neoplasm of cervix: Secondary | ICD-10-CM | POA: Diagnosis present

## 2023-09-02 DIAGNOSIS — E119 Type 2 diabetes mellitus without complications: Secondary | ICD-10-CM

## 2023-09-02 DIAGNOSIS — E0590 Thyrotoxicosis, unspecified without thyrotoxic crisis or storm: Secondary | ICD-10-CM

## 2023-09-02 DIAGNOSIS — K219 Gastro-esophageal reflux disease without esophagitis: Secondary | ICD-10-CM | POA: Diagnosis not present

## 2023-09-02 DIAGNOSIS — R002 Palpitations: Secondary | ICD-10-CM

## 2023-09-02 DIAGNOSIS — Z0000 Encounter for general adult medical examination without abnormal findings: Secondary | ICD-10-CM | POA: Diagnosis not present

## 2023-09-02 DIAGNOSIS — Z23 Encounter for immunization: Secondary | ICD-10-CM | POA: Diagnosis not present

## 2023-09-02 DIAGNOSIS — Z1231 Encounter for screening mammogram for malignant neoplasm of breast: Secondary | ICD-10-CM

## 2023-09-02 DIAGNOSIS — Z7984 Long term (current) use of oral hypoglycemic drugs: Secondary | ICD-10-CM

## 2023-09-02 LAB — LIPID PANEL
Cholesterol: 165 mg/dL (ref 0–200)
HDL: 64.6 mg/dL (ref >39.00)
LDL Cholesterol: 85 mg/dL (ref 0–99)
NonHDL: 100.7
Total CHOL/HDL Ratio: 3
Triglycerides: 81 mg/dL (ref 0.0–149.0)
VLDL: 16.2 mg/dL (ref 0.0–40.0)

## 2023-09-02 LAB — COMPREHENSIVE METABOLIC PANEL WITH GFR
ALT: 21 U/L (ref 0–35)
AST: 27 U/L (ref 0–37)
Albumin: 4.2 g/dL (ref 3.5–5.2)
Alkaline Phosphatase: 83 U/L (ref 39–117)
BUN: 14 mg/dL (ref 6–23)
CO2: 29 meq/L (ref 19–32)
Calcium: 9.8 mg/dL (ref 8.4–10.5)
Chloride: 104 meq/L (ref 96–112)
Creatinine, Ser: 0.78 mg/dL (ref 0.40–1.20)
GFR: 82.16 mL/min
Glucose, Bld: 141 mg/dL — ABNORMAL HIGH (ref 70–99)
Potassium: 4.4 meq/L (ref 3.5–5.1)
Sodium: 140 meq/L (ref 135–145)
Total Bilirubin: 0.7 mg/dL (ref 0.2–1.2)
Total Protein: 7.2 g/dL (ref 6.0–8.3)

## 2023-09-02 LAB — MICROALBUMIN / CREATININE URINE RATIO
Creatinine,U: 92.6 mg/dL
Microalb Creat Ratio: 0.8 mg/g (ref 0.0–30.0)
Microalb, Ur: <0.7 mg/dL (ref 0.0–1.9)

## 2023-09-02 LAB — HEMOGLOBIN A1C: Hgb A1c MFr Bld: 6.9 % — ABNORMAL HIGH (ref 4.6–6.5)

## 2023-09-02 LAB — TSH: TSH: 0.83 u[IU]/mL (ref 0.35–5.50)

## 2023-09-02 LAB — T4, FREE: Free T4: 1.01 ng/dL (ref 0.60–1.60)

## 2023-09-02 NOTE — Assessment & Plan Note (Signed)
Repeat A1C pending.  Continue metformin XR 500 mg daily.  Reseume Ozempic at 0.25 mg weekly x 4 weeks, then increase to 0.5 mg daily.  Urine microalbumin due and pending.  Follow up in 3-6 months based on A1C result.

## 2023-09-02 NOTE — Assessment & Plan Note (Signed)
Prevnar 20 provided today. Pap smear due, completed today Mammogram due, orders placed. Colonoscopy UTD, due 2028  Discussed the importance of a healthy diet and regular exercise in order for weight loss, and to reduce the risk of further co-morbidity.  Exam stable. Labs pending.  Follow up in 1 year for repeat physical.

## 2023-09-02 NOTE — Progress Notes (Signed)
Subjective:    Patient ID: Rebecca Macias, female    DOB: August 29, 1961, 62 y.o.   MRN: [id number]  HPI  Rebecca Macias is a very pleasant 62 y.o. female who presents today for complete physical and follow up of chronic conditions.   Immunizations: -Tetanus: Completed in 2016 -Influenza: Influenza vaccine provided today.  -Shingles: Completed Shingrix series -Pneumonia: Completed in 2018  Diet: Fair diet.  Exercise: No regular exercise.  Eye exam: Completes annually  Dental exam: Completes semi-annually    Pap Smear: Completed in 2021 Mammogram: September 2023  Colonoscopy: Completed in 2023, due 2028  BP Readings from Last 3 Encounters:  09/02/23 122/64  06/18/23 124/72  01/13/23 127/83       Review of Systems  Constitutional:  Negative for unexpected weight change.  HENT:  Negative for rhinorrhea.   Respiratory:  Negative for cough and shortness of breath.   Cardiovascular:  Negative for chest pain.  Gastrointestinal:  Negative for constipation and diarrhea.  Genitourinary:  Negative for difficulty urinating and menstrual problem.  Musculoskeletal:  Negative for arthralgias and myalgias.  Skin:  Negative for rash.  Allergic/Immunologic: Negative for environmental allergies.  Neurological:  Negative for dizziness, numbness and headaches.  Psychiatric/Behavioral:  The patient is not nervous/anxious.          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   Type 2 diabetes mellitus (HCC)     Social History   Socioeconomic History   Marital status: Divorced    Spouse name: Not on file   Number of children: Not on file   Years of education: Not on file   Highest education level: Some college, no degree  Occupational History   Not on file  Tobacco Use   Smoking status:  Former    Current packs/day: 0.00    Types: Cigarettes, Cigars    Quit date: 09/19/2011    Years since quitting: 11.9   Smokeless tobacco: Never  Vaping Use   Vaping status: Former  Substance and Sexual Activity   Alcohol use: No   Drug use: No   Sexual activity: Not on file  Other Topics Concern   Not on file  Social History Narrative   Works in Audiological scientist at [employer].   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   Social Determinants of Health   Financial Resource Strain: Low Risk  (06/17/2023)   Overall Financial Resource Strain (CARDIA)    Difficulty of Paying Living Expenses: Not very hard  Food Insecurity: No Food Insecurity (06/17/2023)   Hunger Vital Sign    Worried About Running Out of Food in the Last Year: Never true    Ran Out of Food in the Last Year: Never true  Transportation Needs: No Transportation Needs (06/17/2023)   PRAPARE - Administrator, Civil Service (Medical): No    Lack of Transportation (Non-Medical): No  Physical Activity: Sufficiently Active (06/17/2023)   Exercise Vital Sign    Days of Exercise per Week: 7 days    Minutes of Exercise per Session: 60 min  Stress: Stress Concern Present (06/17/2023)   [employer] of Occupational Health - Occupational Stress Questionnaire    Feeling of Stress : To some extent  Social Connections: Moderately Integrated (06/17/2023)   Social Connection and  Isolation Panel [NHANES]    Frequency of Communication with Friends and Family: More than three times a week    Frequency of Social Gatherings with Friends and Family: Once a week    Attends Religious Services: More than 4 times per year    Active Member of [employer] or Organizations: Yes    Attends Banker Meetings: More than 4 times per year    Marital Status: Divorced  Catering manager Violence: Not on file    Past Surgical History:  Procedure Laterality Date   BLADDER SUSPENSION  2009   Colectomy Left    likely sigmoid,  open.  for diverticulitis.   COLONOSCOPY WITH PROPOFOL N/A 10/01/2022   Procedure: COLONOSCOPY WITH PROPOFOL;  Surgeon: Toney Reil, MD;  Location: [facility] ENDOSCOPY;  Service: Gastroenterology;  Laterality: N/A;   SMALL INTESTINE SURGERY     TUBAL LIGATION     XI ROBOTIC LAPAROSCOPIC ASSISTED APPENDECTOMY N/A 12/29/2022   Procedure: XI ROBOTIC LYSIS OF ADHESIONS AND APPENDECTOMY;  Surgeon: Campbell Lerner, MD;  Location: [facility] ORS;  Service: General;  Laterality: N/A;    Family History  Problem Relation Age of Onset   Heart disease Father    Kidney disease Father    Diabetes Father    Diabetes Maternal Grandmother    Diabetes Paternal Grandmother    Breast cancer Neg Hx     Allergies  Allergen Reactions   Flagyl [Metronidazole] Itching    Current Outpatient Medications on File Prior to Visit  Medication Sig Dispense Refill   atenolol (TENORMIN) 25 MG tablet TAKE 1 TABLET (25 MG TOTAL) BY MOUTH DAILY. FOR PALPITATIONS. 90 tablet 0   Biotin 1000 MCG tablet Take 1,000 mcg by mouth daily.     blood glucose meter kit and supplies KIT Dispense based on patient and insurance preference. Use up to four times daily as directed. (FOR ICD-9 250.00, 250.01). 1 each 0   cyanocobalamin 500 MCG tablet Take 1,000 mcg by mouth daily. Reported on 06/12/2016     docusate sodium (COLACE) 100 MG capsule Take 200 mg by mouth daily. Reported on 06/12/2016     magnesium 30 MG tablet Take 30 mg by mouth 2 (two) times daily.     metFORMIN (GLUCOPHAGE-XR) 500 MG 24 hr tablet Take 1 tablet (500 mg total) by mouth daily with breakfast. for diabetes. 90 tablet 0   rosuvastatin (CRESTOR) 5 MG tablet TAKE 1 TABLET (5 MG TOTAL) BY MOUTH EVERY EVENING. FOR CHOLESTEROL. 90 tablet 0   Semaglutide,0.25 or 0.5MG /DOS, (OZEMPIC, 0.25 OR 0.5 MG/DOSE,) 2 MG/3ML SOPN Inject 0.5 mg into the skin once a week. for diabetes. 9 mL 0   No current facility-administered medications on file prior to visit.    BP 122/64   Pulse  88   Temp 97.9 F (36.6 C) (Temporal)   Ht 5\' 5"  (1.651 m)   Wt 202 lb (91.6 kg)   LMP 11/03/2012   SpO2 98%   BMI 33.61 kg/m  Objective:   Physical Exam Exam conducted with a chaperone present.  HENT:     Right Ear: Tympanic membrane and ear canal normal.     Left Ear: Tympanic membrane and ear canal normal.     Nose: Nose normal.  Eyes:     Conjunctiva/sclera: Conjunctivae normal.     Pupils: Pupils are equal, round, and reactive to light.  Neck:     Thyroid: No thyromegaly.  Cardiovascular:     Rate and Rhythm: Normal rate and  regular rhythm.     Heart sounds: No murmur heard. Pulmonary:     Effort: Pulmonary effort is normal.     Breath sounds: Normal breath sounds. No rales.  Abdominal:     General: Bowel sounds are normal.     Palpations: Abdomen is soft.     Tenderness: There is no abdominal tenderness.  Genitourinary:    Labia:        Right: No tenderness or lesion.        Left: No tenderness or lesion.      Vagina: Normal.     Cervix: Normal.     Uterus: Normal.      Adnexa: Right adnexa normal and left adnexa normal.  Musculoskeletal:        General: Normal range of motion.     Cervical back: Neck supple.  Lymphadenopathy:     Cervical: No cervical adenopathy.  Skin:    General: Skin is warm and dry.     Findings: No rash.  Neurological:     Mental Status: She is alert and oriented to person, place, and time.     Cranial Nerves: No cranial nerve deficit.     Deep Tendon Reflexes: Reflexes are normal and symmetric.  Psychiatric:        Mood and Affect: Mood normal.           Assessment & Plan:  Routine general medical examination at a health care facility Assessment & Plan: Prevnar 20 provided today. Pap smear due, completed today Mammogram due, orders placed. Colonoscopy UTD, due 2028  Discussed the importance of a healthy diet and regular exercise in order for weight loss, and to reduce the risk of further co-morbidity.  Exam  stable. Labs pending.  Follow up in 1 year for repeat physical.    Screening mammogram for breast cancer -     3D Screening Mammogram, Left and Right; Future  Gastroesophageal reflux disease, unspecified whether esophagitis present Assessment & Plan: Controlled.  Continue off medication.  Continue to monitor.    Non-insulin dependent type 2 diabetes mellitus (HCC) Assessment & Plan: Repeat A1C pending.  Continue metformin XR 500 mg daily.  Reseume Ozempic at 0.25 mg weekly x 4 weeks, then increase to 0.5 mg daily.  Urine microalbumin due and pending.  Follow up in 3-6 months based on A1C result.   Orders: -     Lipid panel -     Hemoglobin A1c -     Comprehensive metabolic panel -     Microalbumin / creatinine urine ratio  Subclinical hyperthyroidism Assessment & Plan: Controlled.  No longer following with endocrinology.  Continue atenolol 25 mg daily. Repeat thyroid studies pending.  Orders: -     TSH -     T4, free  Palpitations Assessment & Plan: Controlled.  Continue atenolol 25 mg daily.    Screening for cervical cancer -     Cytology - PAP        Doreene Nest, NP

## 2023-09-02 NOTE — Patient Instructions (Signed)
Stop by the lab prior to leaving today. I will notify you of your results once received.   [facility].   Please schedule a follow up visit for 6 months for a diabetes check.  It was a pleasure to see you today!

## 2023-09-02 NOTE — Assessment & Plan Note (Signed)
Controlled.   Continue off medication. Continue to monitor.

## 2023-09-02 NOTE — Assessment & Plan Note (Signed)
Controlled.  Continue atenolol 25 mg daily.

## 2023-09-02 NOTE — Assessment & Plan Note (Signed)
Controlled.  No longer following with endocrinology. Continue atenolol 25 mg daily.  Repeat thyroid studies pending.

## 2023-09-03 ENCOUNTER — Other Ambulatory Visit: Payer: Self-pay | Admitting: Primary Care

## 2023-09-03 DIAGNOSIS — E785 Hyperlipidemia, unspecified: Secondary | ICD-10-CM

## 2023-09-03 DIAGNOSIS — R002 Palpitations: Secondary | ICD-10-CM

## 2023-09-03 DIAGNOSIS — E119 Type 2 diabetes mellitus without complications: Secondary | ICD-10-CM

## 2023-09-03 MED ORDER — OZEMPIC (0.25 OR 0.5 MG/DOSE) 2 MG/3ML ~~LOC~~ SOPN: PEN_INJECTOR | SUBCUTANEOUS | 0 refills | Status: DC

## 2023-09-04 LAB — CYTOLOGY - PAP
Comment: NEGATIVE
Diagnosis: NEGATIVE
High risk HPV: NEGATIVE

## 2023-09-25 ENCOUNTER — Ambulatory Visit
Admission: RE | Admit: 2023-09-25 | Discharge: 2023-09-25 | Disposition: A | Discharge status: Home / Self Care | Payer: Commercial Managed Care - PPO | Source: Ambulatory Visit | Attending: Primary Care

## 2023-09-25 DIAGNOSIS — Z1231 Encounter for screening mammogram for malignant neoplasm of breast: Secondary | ICD-10-CM | POA: Diagnosis present

## 2023-09-29 ENCOUNTER — Other Ambulatory Visit: Payer: Self-pay | Admitting: Primary Care

## 2023-09-29 DIAGNOSIS — R928 Other abnormal and inconclusive findings on diagnostic imaging of breast: Secondary | ICD-10-CM

## 2023-10-08 ENCOUNTER — Ambulatory Visit
Admission: RE | Admit: 2023-10-08 | Discharge: 2023-10-08 | Disposition: A | Discharge status: Home / Self Care | Payer: Commercial Managed Care - PPO | Source: Ambulatory Visit | Attending: Primary Care | Admitting: Primary Care

## 2023-10-08 DIAGNOSIS — R928 Other abnormal and inconclusive findings on diagnostic imaging of breast: Secondary | ICD-10-CM | POA: Diagnosis present

## 2023-10-09 ENCOUNTER — Other Ambulatory Visit: Payer: Self-pay | Admitting: Primary Care

## 2023-10-09 DIAGNOSIS — N63 Unspecified lump in unspecified breast: Secondary | ICD-10-CM

## 2023-10-09 DIAGNOSIS — R928 Other abnormal and inconclusive findings on diagnostic imaging of breast: Secondary | ICD-10-CM

## 2023-10-21 ENCOUNTER — Other Ambulatory Visit: Payer: Self-pay | Admitting: Primary Care

## 2023-10-21 DIAGNOSIS — E785 Hyperlipidemia, unspecified: Secondary | ICD-10-CM

## 2023-10-21 DIAGNOSIS — E119 Type 2 diabetes mellitus without complications: Secondary | ICD-10-CM

## 2023-10-22 ENCOUNTER — Ambulatory Visit
Admission: RE | Admit: 2023-10-22 | Discharge: 2023-10-22 | Disposition: A | Discharge status: Home / Self Care | Payer: Commercial Managed Care - PPO | Source: Ambulatory Visit | Attending: Primary Care | Admitting: Primary Care

## 2023-10-22 DIAGNOSIS — N63 Unspecified lump in unspecified breast: Secondary | ICD-10-CM

## 2023-10-22 DIAGNOSIS — C50812 Malignant neoplasm of overlapping sites of left female breast: Secondary | ICD-10-CM | POA: Insufficient documentation

## 2023-10-22 DIAGNOSIS — R928 Other abnormal and inconclusive findings on diagnostic imaging of breast: Secondary | ICD-10-CM

## 2023-10-22 HISTORY — PX: BREAST BIOPSY: SHX20

## 2023-10-22 MED ORDER — LIDOCAINE HCL 1 % IJ SOLN
5.0000 mL | Freq: Once | INTRAMUSCULAR | Status: AC
  Administered 2023-10-22: 5 mL
  Filled 2023-10-22: qty 5

## 2023-10-22 MED ORDER — LIDOCAINE 1 % OPTIME INJ - NO CHARGE
5.0000 mL | Freq: Once | INTRAMUSCULAR | Status: AC
  Administered 2023-10-22: 5 mL
  Filled 2023-10-22: qty 6

## 2023-10-23 ENCOUNTER — Encounter: Payer: Self-pay | Admitting: *Deleted

## 2023-10-23 DIAGNOSIS — C50919 Malignant neoplasm of unspecified site of unspecified female breast: Secondary | ICD-10-CM

## 2023-10-23 LAB — SURGICAL PATHOLOGY

## 2023-10-23 NOTE — Progress Notes (Signed)
Received referral for newly diagnosed breast cancer from [facility].  Navigation initiated.  She will see Dr. Smith Robert on Wednesday 11/20.   Referral placed to Dr. Claudine Mouton as she has seen him previously for appendectomy.

## 2023-10-25 ENCOUNTER — Other Ambulatory Visit: Payer: Self-pay | Admitting: Primary Care

## 2023-10-25 DIAGNOSIS — R002 Palpitations: Secondary | ICD-10-CM

## 2023-10-27 NOTE — Progress Notes (Unsigned)
Patient ID: Rebecca Macias, female   DOB: 07/18/1961, 62 y.o.   MRN: [id number]  Chief Complaint: Left breast cancer  History of Present Illness Rebecca Macias is a 62 y.o. 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  Saw Dr. Smith Robert yesterday, screening mammography led to diagnostic, and ultrasound evaluation revealing the lesion.  Core biopsy obtained confirming the diagnosis noted.  She denies any history of oral contraceptive use or hormonal replacement therapy.  She underwent menopause approximately 10 years ago at the age of 51.  She has a maternal great aunt who had breast cancer.  She denies any history of miscarriages or abortions.  She was 19 when she had her first pregnancy, and has had 5 children, aged 68-42 currently.  She never palpated a lump, had any breast pain or skin changes.  Denies any history of nipple discharge, but has maintained monthly breast self examinations and annual mammography.  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  for diverticulitis.   COLONOSCOPY WITH PROPOFOL N/A 10/01/2022   Procedure: COLONOSCOPY WITH PROPOFOL;  Surgeon: Toney Reil, MD;  Location: [facility] ENDOSCOPY;  Service: Gastroenterology;  Laterality: N/A;   SMALL INTESTINE SURGERY     TUBAL LIGATION     XI ROBOTIC LAPAROSCOPIC ASSISTED APPENDECTOMY N/A 12/29/2022   Procedure: XI ROBOTIC LYSIS OF  ADHESIONS AND APPENDECTOMY;  Surgeon: Campbell Lerner, MD;  Location: [facility] ORS;  Service: General;  Laterality: N/A;    Allergies  Allergen Reactions   Flagyl [Metronidazole] Itching    Current Outpatient Medications  Medication Sig Dispense Refill   atenolol (TENORMIN) 25 MG tablet TAKE 1 TABLET (25 MG TOTAL) BY MOUTH DAILY. FOR PALPITATIONS. 90 tablet 2   Biotin 1000 MCG tablet Take 1,000 mcg by mouth daily.     blood glucose meter kit and supplies KIT Dispense based on patient and insurance preference. Use up to four times daily as directed. (FOR ICD-9 250.00, 250.01). 1 each 0   cyanocobalamin 500 MCG tablet Take 1,000 mcg by mouth daily. Reported on 06/12/2016     docusate sodium (COLACE) 100 MG capsule Take 200 mg by mouth daily. Reported on 06/12/2016     magnesium 30 MG tablet Take 30 mg by mouth 2 (two) times daily.     metFORMIN (GLUCOPHAGE-XR) 500 MG 24 hr tablet TAKE 1 TABLET (500 MG TOTAL) BY MOUTH DAILY WITH BREAKFAST. FOR DIABETES. 90 tablet 0   rosuvastatin (CRESTOR) 5 MG tablet TAKE 1 TABLET (5 MG TOTAL) BY MOUTH EVERY EVENING. FOR CHOLESTEROL. 90 tablet 2   No current facility-administered medications for this visit.    Family History Family History  Problem Relation Age of Onset   Cancer - Cervical Mother        dx 46s; Passed away from Kidney Issues (Not Cancer)  Heart disease Father    Kidney disease Father    Diabetes Father    Skin cancer Sister    Stomach cancer Paternal Uncle    Diabetes Maternal Grandmother    Skin cancer Maternal Grandfather    Diabetes Paternal Grandmother    Breast cancer Other        mat great aunts x2; unk age of dx   Lung cancer Cousin    Brain cancer Cousin       Social History Social History   Tobacco Use   Smoking status: Former    Current packs/day: 0.00    Types: Cigarettes, Cigars    Quit date: 09/19/2011    Years since quitting: 12.1   Smokeless tobacco: Never  Vaping Use   Vaping status: Former  Substance Use  Topics   Alcohol use: Yes    Comment: Occasional glass of wine   Drug use: No        Review of Systems  Constitutional: Negative.   HENT: Negative.    Eyes: Negative.   Respiratory: Negative.    Cardiovascular: Negative.   Gastrointestinal:  Positive for constipation.  Genitourinary: Negative.   Skin: Negative.   Neurological: Negative.   Psychiatric/Behavioral: Negative.       Physical Exam Blood pressure 122/79, pulse 98, temperature 98.4 F (36.9 C), temperature source Oral, height 5\' 5"  (1.651 m), weight 202 lb 6.4 oz (91.8 kg), last menstrual period 11/03/2012, SpO2 96%. Last Weight  Most recent update: 10/29/2023  9:53 AM    Weight  91.8 kg (202 lb 6.4 oz)             CONSTITUTIONAL: Well developed, and nourished, appropriately responsive and aware without distress.   EYES: Sclera non-icteric.   EARS, NOSE, MOUTH AND THROAT:  The oropharynx is clear. Oral mucosa is pink and moist.    Hearing is intact to voice.  NECK: Trachea is midline, and there is no jugular venous distension.  LYMPH NODES:  Lymph nodes in the neck are not appreciated. RESPIRATORY:  Lungs are clear, and breath sounds are equal bilaterally.  Normal respiratory effort without pathologic use of accessory muscles. CARDIOVASCULAR: Heart is regular in rate and rhythm.   Well perfused.  GI: The abdomen is  soft, nontender, and nondistended. There were no palpable masses.  I did not appreciate hepatosplenomegaly. GU: Rebecca Macias is present as chaperone.  There are some Band-Aid/Steri-Strips in the medial lower quadrant of the left breast.  There is no remarkable hematoma grossly noted on exam.  No evidence of skin changes or peau d'orange.  Right breast exam without dominant/suspicious suspicious nodularity or mass. MUSCULOSKELETAL:  Symmetrical muscle tone appreciated in all four extremities.    SKIN: Skin turgor is normal. No pathologic skin lesions appreciated.  NEUROLOGIC:  Motor and sensation appear  grossly normal.  Cranial nerves are grossly without defect. PSYCH:  Alert and oriented to person, place and time. Affect is appropriate for situation.  Data Reviewed I have personally reviewed what is currently available of the patient's imaging, recent labs and medical records.   Labs:     Latest Ref Rng & Units 08/28/2022    8:26 AM 08/29/2021    8:46 AM 11/10/2019   12:19 PM  CBC  WBC 4.0 - 10.5 K/uL 4.0  4.2  6.8   Hemoglobin 12.0 - 15.0 g/dL 16.1  09.6  04.5   Hematocrit 36.0 - 46.0 % 39.5  40.8  39.9   Platelets 150.0 - 400.0 K/uL  272.0  275.0  322.0       Latest Ref Rng & Units 09/02/2023    9:48 AM 01/08/2023    2:30 PM 10/24/2022    9:20 AM  CMP  Glucose 70 - 99 mg/dL 478     BUN 6 - 23 mg/dL 14     Creatinine 2.95 - 1.20 mg/dL 6.21  3.08  6.57   Sodium 135 - 145 mEq/L 140     Potassium 3.5 - 5.1 mEq/L 4.4     Chloride 96 - 112 mEq/L 104     CO2 19 - 32 mEq/L 29     Calcium 8.4 - 10.5 mg/dL 9.8     Total Protein 6.0 - 8.3 g/dL 7.2     Total Bilirubin 0.2 - 1.2 mg/dL 0.7     Alkaline Phos 39 - 117 U/L 83     AST 0 - 37 U/L 27     ALT 0 - 35 U/L 21      REPORT OF SURGICAL PATHOLOGY   Accession #: [id number] Patient Name: SAJA, BALAGUER Visit # : [id number]  MRN: [id number] Physician: Marilynn Rail DOB/Age [birth year] (Age: 41) Gender: F Collected Date: 10/22/2023 Received Date: 10/22/2023  FINAL DIAGNOSIS       1. Breast, left, needle core biopsy, 9 o'clock mass :      - INVASIVE MAMMARY CARCINOMA, NO SPECIAL TYPE, WITH ASSOCIATED DENSE CHRONIC      INFLAMMATION.      - TUBULE FORMATION: SCORE 3      - NUCLEAR PLEOMORPHISM: SCORE 3      - MITOTIC COUNT: SCORE 1      - TOTAL SCORE: 7      - OVERALL GRADE: 2      - LYMPHOVASCULAR INVASION: NOT IDENTIFIED      - CANCER LENGTH: 8 MM      - CALCIFICATIONS: NOT IDENTIFIED      - DUCTAL CARCINOMA IN SITU: PRESENT, INTERMEDIATE GRADE      - SEE NOTE.       Diagnosis Note : These results were  communicated to Randa Lynn, RN in the      [facility] on 10/23/2023.      ER, PR, and HER2 will be performed on block 1A and reported in an addendum.      This case underwent intradepartmental consultation and Dr. [place] concurs with      the interpretation.  DATE SIGNED OUT: 10/23/2023 ELECTRONIC SIGNATURE : Oneita Kras Md, Tara , Pathologist, Electronic Signature   ADDENDUM Breast, left, needle core biopsy, 9 o'clock mass PROGNOSTIC INDICATORS  Results: IMMUNOHISTOCHEMICAL AND MORPHOMETRIC ANALYSIS PERFORMED MANUALLY The tumor cells are positive for Her2 (3+). Estrogen Receptor:  0%, NEGATIVE Progesterone Receptor:  0%, NEGATIVE COMMENT:  The negative hormone receptor study(ies) in this case has an internal positive control.  REFERENCE RANGE ESTROGEN RECEPTOR NEGATIVE     0% POSITIVE       =>1% REFERENCE RANGE PROGESTERONE RECEPTOR NEGATIVE     0% POSITIVE        =>1% All controls stained appropriately Lance Coon Md, Pathologist, Electronic Signature [phone])    Imaging: Radiological images reviewed:  CLINICAL DATA:  62 year old female presenting as a recall from screening for possible left breast asymmetry.   EXAM: DIGITAL DIAGNOSTIC UNILATERAL LEFT MAMMOGRAM WITH TOMOSYNTHESIS AND CAD; ULTRASOUND LEFT BREAST LIMITED   TECHNIQUE: Left digital diagnostic mammography and breast tomosynthesis was performed. The images were evaluated with computer-aided detection. ;  Targeted ultrasound examination of the left breast was performed.   COMPARISON:  Previous exam(s).   ACR Breast Density Category b: There are scattered areas of fibroglandular density.   FINDINGS: Mammogram:   Left breast: Spot compression tomosynthesis MLO as well as full field mL tomosynthesis views of the left breast were performed demonstrating persistence of an asymmetry in the central left breast measuring approximately 0.6 cm.   Ultrasound:   Targeted ultrasound  performed in the left breast at 9 o'clock retroareolar demonstrating an irregular hypoechoic mass measuring 0.6 x 0.5 x 0.4 cm. This likely corresponds to the mammographic finding. Targeted ultrasound of the left axilla demonstrates normal lymph nodes.   IMPRESSION: Indeterminate mass in the left breast at 9 o'clock retroareolar measuring 0.6 cm.   RECOMMENDATION: Ultrasound-guided core needle biopsy x1 of the left breast.   I have discussed the findings and recommendations with the patient. If applicable, a reminder letter will be sent to the patient regarding the next appointment.   BI-RADS CATEGORY  4: Suspicious.     Electronically Signed   By: Emmaline Kluver M.D.   On: 10/08/2023 15:12 Post clip placement imaging:    Within last 24 hrs: No results found.  Assessment    8 mm invasive mammary carcinoma of the left breast, ER/PR negative, HER2 positive. Patient Active Problem List   Diagnosis Date Noted   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 in female, estrogen receptor negative (HCC) 10/28/2023   Goals of care, counseling/discussion 10/28/2023   Allergic reaction 06/18/2023   Postoperative adhesions 12/29/2022   Hyperplastic rectal polyp    Adenoma of appendix    Chronic low back pain with left-sided sciatica 10/10/2021   Non-insulin dependent type 2 diabetes mellitus (HCC) 07/10/2016   GERD (gastroesophageal reflux disease) 07/02/2016   Subclinical hyperthyroidism 06/12/2016   Palpitations 05/08/2015   Insomnia 05/08/2015   Symptoms, such as flushing, sleeplessness, headache, lack of concentration, associated with the menopause 05/08/2015   Thyroid nodule, cold 02/12/2015   Routine general medical examination at a health care facility 04/20/2014   Obesity (BMI 35.0-39.9 without comorbidity) 09/01/2012   Ovarian cyst     Plan    Scout tagged left breast lumpectomy with sentinel lymph node biopsy.  Placement of Port-A-Cath for  anticipated postoperative adjuvant chemo therapy.  I discussed the available options with the patient. The risk of recurrence is similar between mastectomy and lumpectomy with radiation.  I also discussed that given the small size of the cancer would recommend localization lumpectomy with radiation to follow.  I also discussed that we would need to do a sentinel lymph node biopsy to check the nodes.   Explained to the patient that after her surgical treatment additional treatment will depend on her prognostic indicators and stage.    I discussed risks of bleeding, infection, damage to surrounding tissues, having positive margins, needing further resection, damage to nerves causing arm numbness or difficulty raising arm, causing lymphedema in the arm; as well as anesthesia risks of MI, stroke, prolonged ventilation, pulmonary embolism, thrombosis and even death.   Patient was given the opportunity to ask questions and have them answered.  They would like to proceed with left breast Scout tag localized lumpectomy with sentinel lymph node biopsy.   Face-to-face time spent with the patient and accompanying care providers(if present) was 30 minutes, with more than 50% of the time spent counseling, educating, and coordinating care of the patient.    These notes generated with voice  recognition software. I apologize for typographical errors.  Campbell Lerner M.D., FACS 10/29/2023, 10:14 AM

## 2023-10-27 NOTE — H&P (View-Only) (Signed)
Patient ID: Rebecca Macias, female   DOB: 17-May-1961, 62 y.o.   MRN: [id number]  Chief Complaint: Left breast cancer  History of Present Illness AIKA DEPIES is a 62 y.o. 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  Saw Dr. Smith Robert yesterday, screening mammography led to diagnostic, and ultrasound evaluation revealing the lesion.  Core biopsy obtained confirming the diagnosis noted.  She denies any history of oral contraceptive use or hormonal replacement therapy.  She underwent menopause approximately 10 years ago at the age of 67.  She has a maternal great aunt who had breast cancer.  She denies any history of miscarriages or abortions.  She was 19 when she had her first pregnancy, and has had 5 children, aged 28-42 currently.  She never palpated a lump, had any breast pain or skin changes.  Denies any history of nipple discharge, but has maintained monthly breast self examinations and annual mammography.  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  for diverticulitis.   COLONOSCOPY WITH PROPOFOL N/A 10/01/2022   Procedure: COLONOSCOPY WITH PROPOFOL;  Surgeon: Toney Reil, MD;  Location: [facility] ENDOSCOPY;  Service: Gastroenterology;  Laterality: N/A;   SMALL INTESTINE SURGERY     TUBAL LIGATION     XI ROBOTIC LAPAROSCOPIC ASSISTED APPENDECTOMY N/A 12/29/2022   Procedure: XI ROBOTIC LYSIS OF  ADHESIONS AND APPENDECTOMY;  Surgeon: Campbell Lerner, MD;  Location: [facility] ORS;  Service: General;  Laterality: N/A;    Allergies  Allergen Reactions   Flagyl [Metronidazole] Itching    Current Outpatient Medications  Medication Sig Dispense Refill   atenolol (TENORMIN) 25 MG tablet TAKE 1 TABLET (25 MG TOTAL) BY MOUTH DAILY. FOR PALPITATIONS. 90 tablet 2   Biotin 1000 MCG tablet Take 1,000 mcg by mouth daily.     blood glucose meter kit and supplies KIT Dispense based on patient and insurance preference. Use up to four times daily as directed. (FOR ICD-9 250.00, 250.01). 1 each 0   cyanocobalamin 500 MCG tablet Take 1,000 mcg by mouth daily. Reported on 06/12/2016     docusate sodium (COLACE) 100 MG capsule Take 200 mg by mouth daily. Reported on 06/12/2016     magnesium 30 MG tablet Take 30 mg by mouth 2 (two) times daily.     metFORMIN (GLUCOPHAGE-XR) 500 MG 24 hr tablet TAKE 1 TABLET (500 MG TOTAL) BY MOUTH DAILY WITH BREAKFAST. FOR DIABETES. 90 tablet 0   rosuvastatin (CRESTOR) 5 MG tablet TAKE 1 TABLET (5 MG TOTAL) BY MOUTH EVERY EVENING. FOR CHOLESTEROL. 90 tablet 2   No current facility-administered medications for this visit.    Family History Family History  Problem Relation Age of Onset   Cancer - Cervical Mother        dx 35s; Passed away from Kidney Issues (Not Cancer)  Heart disease Father    Kidney disease Father    Diabetes Father    Skin cancer Sister    Stomach cancer Paternal Uncle    Diabetes Maternal Grandmother    Skin cancer Maternal Grandfather    Diabetes Paternal Grandmother    Breast cancer Other        mat great aunts x2; unk age of dx   Lung cancer Cousin    Brain cancer Cousin       Social History Social History   Tobacco Use   Smoking status: Former    Current packs/day: 0.00    Types: Cigarettes, Cigars    Quit date: 09/19/2011    Years since quitting: 12.1   Smokeless tobacco: Never  Vaping Use   Vaping status: Former  Substance Use  Topics   Alcohol use: Yes    Comment: Occasional glass of wine   Drug use: No        Review of Systems  Constitutional: Negative.   HENT: Negative.    Eyes: Negative.   Respiratory: Negative.    Cardiovascular: Negative.   Gastrointestinal:  Positive for constipation.  Genitourinary: Negative.   Skin: Negative.   Neurological: Negative.   Psychiatric/Behavioral: Negative.       Physical Exam Blood pressure 122/79, pulse 98, temperature 98.4 F (36.9 C), temperature source Oral, height 5\' 5"  (1.651 m), weight 202 lb 6.4 oz (91.8 kg), last menstrual period 11/03/2012, SpO2 96%. Last Weight  Most recent update: 10/29/2023  9:53 AM    Weight  91.8 kg (202 lb 6.4 oz)             CONSTITUTIONAL: Well developed, and nourished, appropriately responsive and aware without distress.   EYES: Sclera non-icteric.   EARS, NOSE, MOUTH AND THROAT:  The oropharynx is clear. Oral mucosa is pink and moist.    Hearing is intact to voice.  NECK: Trachea is midline, and there is no jugular venous distension.  LYMPH NODES:  Lymph nodes in the neck are not appreciated. RESPIRATORY:  Lungs are clear, and breath sounds are equal bilaterally.  Normal respiratory effort without pathologic use of accessory muscles. CARDIOVASCULAR: Heart is regular in rate and rhythm.   Well perfused.  GI: The abdomen is  soft, nontender, and nondistended. There were no palpable masses.  I did not appreciate hepatosplenomegaly. GU: Marylene Land is present as chaperone.  There are some Band-Aid/Steri-Strips in the medial lower quadrant of the left breast.  There is no remarkable hematoma grossly noted on exam.  No evidence of skin changes or peau d'orange.  Right breast exam without dominant/suspicious suspicious nodularity or mass. MUSCULOSKELETAL:  Symmetrical muscle tone appreciated in all four extremities.    SKIN: Skin turgor is normal. No pathologic skin lesions appreciated.  NEUROLOGIC:  Motor and sensation appear  grossly normal.  Cranial nerves are grossly without defect. PSYCH:  Alert and oriented to person, place and time. Affect is appropriate for situation.  Data Reviewed I have personally reviewed what is currently available of the patient's imaging, recent labs and medical records.   Labs:     Latest Ref Rng & Units 08/28/2022    8:26 AM 08/29/2021    8:46 AM 11/10/2019   12:19 PM  CBC  WBC 4.0 - 10.5 K/uL 4.0  4.2  6.8   Hemoglobin 12.0 - 15.0 g/dL 40.1  02.7  25.3   Hematocrit 36.0 - 46.0 % 39.5  40.8  39.9   Platelets 150.0 - 400.0 K/uL  272.0  275.0  322.0       Latest Ref Rng & Units 09/02/2023    9:48 AM 01/08/2023    2:30 PM 10/24/2022    9:20 AM  CMP  Glucose 70 - 99 mg/dL 098     BUN 6 - 23 mg/dL 14     Creatinine 1.19 - 1.20 mg/dL 1.47  8.29  5.62   Sodium 135 - 145 mEq/L 140     Potassium 3.5 - 5.1 mEq/L 4.4     Chloride 96 - 112 mEq/L 104     CO2 19 - 32 mEq/L 29     Calcium 8.4 - 10.5 mg/dL 9.8     Total Protein 6.0 - 8.3 g/dL 7.2     Total Bilirubin 0.2 - 1.2 mg/dL 0.7     Alkaline Phos 39 - 117 U/L 83     AST 0 - 37 U/L 27     ALT 0 - 35 U/L 21      REPORT OF SURGICAL PATHOLOGY   Accession #: [id number] Patient Name: CHERILYN, PLATTNER Visit # : [id number]  MRN: [id number] Physician: Marilynn Rail DOB/Age [birth year] (Age: 3) Gender: F Collected Date: 10/22/2023 Received Date: 10/22/2023  FINAL DIAGNOSIS       1. Breast, left, needle core biopsy, 9 o'clock mass :      - INVASIVE MAMMARY CARCINOMA, NO SPECIAL TYPE, WITH ASSOCIATED DENSE CHRONIC      INFLAMMATION.      - TUBULE FORMATION: SCORE 3      - NUCLEAR PLEOMORPHISM: SCORE 3      - MITOTIC COUNT: SCORE 1      - TOTAL SCORE: 7      - OVERALL GRADE: 2      - LYMPHOVASCULAR INVASION: NOT IDENTIFIED      - CANCER LENGTH: 8 MM      - CALCIFICATIONS: NOT IDENTIFIED      - DUCTAL CARCINOMA IN SITU: PRESENT, INTERMEDIATE GRADE      - SEE NOTE.       Diagnosis Note : These results were  communicated to Randa Lynn, RN in the      [place] [facility] on 10/23/2023.      ER, PR, and HER2 will be performed on block 1A and reported in an addendum.      This case underwent intradepartmental consultation and Dr. [place] concurs with      the interpretation.  DATE SIGNED OUT: 10/23/2023 ELECTRONIC SIGNATURE : Oneita Kras Md, Tara , Pathologist, Electronic Signature   ADDENDUM Breast, left, needle core biopsy, 9 o'clock mass PROGNOSTIC INDICATORS  Results: IMMUNOHISTOCHEMICAL AND MORPHOMETRIC ANALYSIS PERFORMED MANUALLY The tumor cells are positive for Her2 (3+). Estrogen Receptor:  0%, NEGATIVE Progesterone Receptor:  0%, NEGATIVE COMMENT:  The negative hormone receptor study(ies) in this case has an internal positive control.  REFERENCE RANGE ESTROGEN RECEPTOR NEGATIVE     0% POSITIVE       =>1% REFERENCE RANGE PROGESTERONE RECEPTOR NEGATIVE     0% POSITIVE        =>1% All controls stained appropriately Lance Coon Md, Pathologist, Electronic Signature [phone])    Imaging: Radiological images reviewed:  CLINICAL DATA:  62 year old female presenting as a recall from screening for possible left breast asymmetry.   EXAM: DIGITAL DIAGNOSTIC UNILATERAL LEFT MAMMOGRAM WITH TOMOSYNTHESIS AND CAD; ULTRASOUND LEFT BREAST LIMITED   TECHNIQUE: Left digital diagnostic mammography and breast tomosynthesis was performed. The images were evaluated with computer-aided detection. ;  Targeted ultrasound examination of the left breast was performed.   COMPARISON:  Previous exam(s).   ACR Breast Density Category b: There are scattered areas of fibroglandular density.   FINDINGS: Mammogram:   Left breast: Spot compression tomosynthesis MLO as well as full field mL tomosynthesis views of the left breast were performed demonstrating persistence of an asymmetry in the central left breast measuring approximately 0.6 cm.   Ultrasound:   Targeted ultrasound  performed in the left breast at 9 o'clock retroareolar demonstrating an irregular hypoechoic mass measuring 0.6 x 0.5 x 0.4 cm. This likely corresponds to the mammographic finding. Targeted ultrasound of the left axilla demonstrates normal lymph nodes.   IMPRESSION: Indeterminate mass in the left breast at 9 o'clock retroareolar measuring 0.6 cm.   RECOMMENDATION: Ultrasound-guided core needle biopsy x1 of the left breast.   I have discussed the findings and recommendations with the patient. If applicable, a reminder letter will be sent to the patient regarding the next appointment.   BI-RADS CATEGORY  4: Suspicious.     Electronically Signed   By: Emmaline Kluver M.D.   On: 10/08/2023 15:12 Post clip placement imaging:    Within last 24 hrs: No results found.  Assessment    8 mm invasive mammary carcinoma of the left breast, ER/PR negative, HER2 positive. Patient Active Problem List   Diagnosis Date Noted   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 in female, estrogen receptor negative (HCC) 10/28/2023   Goals of care, counseling/discussion 10/28/2023   Allergic reaction 06/18/2023   Postoperative adhesions 12/29/2022   Hyperplastic rectal polyp    Adenoma of appendix    Chronic low back pain with left-sided sciatica 10/10/2021   Non-insulin dependent type 2 diabetes mellitus (HCC) 07/10/2016   GERD (gastroesophageal reflux disease) 07/02/2016   Subclinical hyperthyroidism 06/12/2016   Palpitations 05/08/2015   Insomnia 05/08/2015   Symptoms, such as flushing, sleeplessness, headache, lack of concentration, associated with the menopause 05/08/2015   Thyroid nodule, cold 02/12/2015   Routine general medical examination at a health care facility 04/20/2014   Obesity (BMI 35.0-39.9 without comorbidity) 09/01/2012   Ovarian cyst     Plan    Scout tagged left breast lumpectomy with sentinel lymph node biopsy.  Placement of Port-A-Cath for  anticipated postoperative adjuvant chemo therapy.  I discussed the available options with the patient. The risk of recurrence is similar between mastectomy and lumpectomy with radiation.  I also discussed that given the small size of the cancer would recommend localization lumpectomy with radiation to follow.  I also discussed that we would need to do a sentinel lymph node biopsy to check the nodes.   Explained to the patient that after her surgical treatment additional treatment will depend on her prognostic indicators and stage.    I discussed risks of bleeding, infection, damage to surrounding tissues, having positive margins, needing further resection, damage to nerves causing arm numbness or difficulty raising arm, causing lymphedema in the arm; as well as anesthesia risks of MI, stroke, prolonged ventilation, pulmonary embolism, thrombosis and even death.   Patient was given the opportunity to ask questions and have them answered.  They would like to proceed with left breast Scout tag localized lumpectomy with sentinel lymph node biopsy.   Face-to-face time spent with the patient and accompanying care providers(if present) was 30 minutes, with more than 50% of the time spent counseling, educating, and coordinating care of the patient.    These notes generated with voice  recognition software. I apologize for typographical errors.  Campbell Lerner M.D., FACS 10/29/2023, 10:14 AM

## 2023-10-28 ENCOUNTER — Encounter: Payer: Self-pay | Admitting: Oncology

## 2023-10-28 ENCOUNTER — Encounter: Payer: Self-pay | Admitting: Licensed Clinical Social Worker

## 2023-10-28 ENCOUNTER — Ambulatory Visit: Payer: Commercial Managed Care - PPO | Admitting: Licensed Clinical Social Worker

## 2023-10-28 ENCOUNTER — Other Ambulatory Visit: Payer: Commercial Managed Care - PPO

## 2023-10-28 ENCOUNTER — Inpatient Hospital Stay: Payer: Commercial Managed Care - PPO | Attending: Oncology | Admitting: Oncology

## 2023-10-28 ENCOUNTER — Encounter: Payer: Self-pay | Admitting: *Deleted

## 2023-10-28 VITALS — BP 129/66 | HR 87 | Temp 97.8°F | Resp 17 | Ht 65.0 in | Wt 203.4 lb

## 2023-10-28 DIAGNOSIS — Z7189 Other specified counseling: Secondary | ICD-10-CM | POA: Insufficient documentation

## 2023-10-28 DIAGNOSIS — Z808 Family history of malignant neoplasm of other organs or systems: Secondary | ICD-10-CM | POA: Diagnosis not present

## 2023-10-28 DIAGNOSIS — Z800 Family history of malignant neoplasm of digestive organs: Secondary | ICD-10-CM | POA: Diagnosis not present

## 2023-10-28 DIAGNOSIS — E119 Type 2 diabetes mellitus without complications: Secondary | ICD-10-CM

## 2023-10-28 DIAGNOSIS — Z803 Family history of malignant neoplasm of breast: Secondary | ICD-10-CM

## 2023-10-28 DIAGNOSIS — Z801 Family history of malignant neoplasm of trachea, bronchus and lung: Secondary | ICD-10-CM | POA: Diagnosis not present

## 2023-10-28 DIAGNOSIS — E785 Hyperlipidemia, unspecified: Secondary | ICD-10-CM

## 2023-10-28 DIAGNOSIS — Z171 Estrogen receptor negative status [ER-]: Secondary | ICD-10-CM | POA: Diagnosis not present

## 2023-10-28 DIAGNOSIS — C50919 Malignant neoplasm of unspecified site of unspecified female breast: Secondary | ICD-10-CM

## 2023-10-28 DIAGNOSIS — Z87891 Personal history of nicotine dependence: Secondary | ICD-10-CM

## 2023-10-28 DIAGNOSIS — Z9189 Other specified personal risk factors, not elsewhere classified: Secondary | ICD-10-CM

## 2023-10-28 DIAGNOSIS — Z79899 Other long term (current) drug therapy: Secondary | ICD-10-CM | POA: Insufficient documentation

## 2023-10-28 DIAGNOSIS — C50312 Malignant neoplasm of lower-inner quadrant of left female breast: Secondary | ICD-10-CM

## 2023-10-28 DIAGNOSIS — Z8049 Family history of malignant neoplasm of other genital organs: Secondary | ICD-10-CM

## 2023-10-28 NOTE — Progress Notes (Signed)
Accompanied patient and family to initial medical oncology appointment.   Reviewed Breast Cancer treatment handbook.   Care plan summary given to patient.   Reviewed outreach programs and cancer center services.   

## 2023-10-28 NOTE — Progress Notes (Signed)
Hematology/Oncology Consult note [facility] [phone] [phone]  Patient Care Team: Doreene Nest, NP as PCP - General (Internal Medicine) Carlus Pavlov, MD as Consulting Physician (Internal Medicine) Hulen Luster, RN as Oncology Nurse Navigator   Name of the patient: Rebecca Macias  [id number]  1961-10-07    Reason for referral-new diagnosis of breast cancer   Referring physician-Catherine Chestine Spore, NP  Date of visit: 10/28/23   History of presenting illness- Patient is a 62 year old female with a past medical history significant for type 2 diabetes and hyperlipidemia who underwent screening mammogram in October 2024 which showed 0.6 x 0.5 x 0.4 cm hypoechoic mass in the left breast at the 9 o'clock position in the retroareolar area.  Ultrasound of the left axilla demonstrated normal lymph nodes.  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  8 mm.  Tumor was ER/PR -0% and HER2 positive +3 by IHC.  Patient referred for further management  Menarche at the age of 62.  She is G5, P5.  Age at first birth 44.  She has used birth control pills in the past.  She attained menopause in her 11s.  Family history significant for breast cancer in 2 sisters of her maternal grandmother.  Mom with cervical cancer in her 61s.  Paternal uncle with stomach cancer.  Her sister has been diagnosed with some form of skin cancer.  ECOG PS- 0  Pain scale- 0   Review of systems- Review of Systems  Constitutional:  Negative for chills, fever, malaise/fatigue and weight loss.  HENT:  Negative for congestion, ear discharge and nosebleeds.   Eyes:  Negative for blurred vision.  Respiratory:  Negative for cough, hemoptysis, sputum production, shortness of breath and wheezing.   Cardiovascular:  Negative for chest pain, palpitations, orthopnea and claudication.  Gastrointestinal:  Negative  for abdominal pain, blood in stool, constipation, diarrhea, heartburn, melena, nausea and vomiting.  Genitourinary:  Negative for dysuria, flank pain, frequency, hematuria and urgency.  Musculoskeletal:  Negative for back pain, joint pain and myalgias.  Skin:  Negative for rash.  Neurological:  Negative for dizziness, tingling, focal weakness, seizures, weakness and headaches.  Endo/Heme/Allergies:  Does not bruise/bleed easily.  Psychiatric/Behavioral:  Negative for depression and suicidal ideas. The patient does not have insomnia.     Allergies  Allergen Reactions   Flagyl [Metronidazole] Itching    Patient Active Problem List   Diagnosis Date Noted   Allergic reaction 06/18/2023   Postoperative adhesions 12/29/2022   Hyperplastic rectal polyp    Adenoma of appendix    Chronic low back pain with left-sided sciatica 10/10/2021   Non-insulin dependent type 2 diabetes mellitus (HCC) 07/10/2016   GERD (gastroesophageal reflux disease) 07/02/2016   Subclinical hyperthyroidism 06/12/2016   Palpitations 05/08/2015   Insomnia 05/08/2015   Symptoms, such as flushing, sleeplessness, headache, lack of concentration, associated with the menopause 05/08/2015   Thyroid nodule, cold 02/12/2015   Routine general medical examination at a health care facility 04/20/2014   Obesity (BMI 35.0-39.9 without comorbidity) 09/01/2012   Ovarian cyst      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  for diverticulitis.   COLONOSCOPY WITH PROPOFOL N/A 10/01/2022   Procedure: COLONOSCOPY WITH PROPOFOL;  Surgeon: Toney Reil, MD;  Location: [facility] ENDOSCOPY;  Service: Gastroenterology;  Laterality: N/A;   SMALL INTESTINE SURGERY     TUBAL LIGATION     XI ROBOTIC LAPAROSCOPIC ASSISTED APPENDECTOMY N/A 12/29/2022   Procedure: XI ROBOTIC LYSIS OF ADHESIONS AND APPENDECTOMY;  Surgeon: Campbell Lerner, MD;  Location: [facility] ORS;  Service: General;  Laterality: N/A;    Social History   Socioeconomic History   Marital status: Divorced    Spouse name: Not on file   Number of children: Not on file   Years of education: Not on file   Highest education level: Some college, no degree  Occupational History   Not on file  Tobacco Use   Smoking status: Former    Current packs/day: 0.00    Types: Cigarettes, Cigars    Quit date: 09/19/2011    Years since quitting: 12.1   Smokeless tobacco: Never  Vaping Use   Vaping status: Former  Substance and Sexual Activity   Alcohol use: Yes    Comment: Occasional glass of wine   Drug use: No   Sexual activity: Not Currently  Other Topics Concern   Not on file  Social History Narrative   Works in Audiological scientist at [employer].   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   Social Determinants of Health   Financial Resource Strain: Low Risk  (06/17/2023)   Overall Financial Resource Strain (CARDIA)    Difficulty of Paying Living Expenses: Not very hard  Food Insecurity: No Food Insecurity (10/28/2023)   Hunger Vital Sign    Worried About Running Out of Food in the Last Year: Never true    Ran Out of Food in the Last Year: Never true  Transportation Needs: No Transportation Needs (10/28/2023)   PRAPARE - Administrator, Civil Service (Medical): No    Lack of Transportation (Non-Medical): No  Physical Activity: Sufficiently Active (06/17/2023)   Exercise Vital Sign    Days of Exercise per Week: 7  days    Minutes of Exercise per Session: 60 min  Stress: Stress Concern Present (06/17/2023)   [employer] of Occupational Health - Occupational Stress Questionnaire    Feeling of Stress : To some extent  Social Connections: Moderately Integrated (06/17/2023)   Social Connection and Isolation Panel [NHANES]    Frequency of Communication with Friends and Family: More than three times a week    Frequency of Social Gatherings with Friends and Family: Once a week    Attends Religious Services: More than 4 times per year    Active Member of [employer] or Organizations: Yes    Attends Banker Meetings: More than 4 times per year    Marital Status: Divorced  Intimate Partner Violence: Not At Risk (10/28/2023)   Humiliation, Afraid, Rape, and Kick questionnaire    Fear of Current or Ex-Partner: No    Emotionally Abused: No    Physically Abused: No    Sexually Abused: No     Family History  Problem Relation Age of Onset  Cancer - Cervical Mother        dx 79s; Passed away from Kidney Issues (Not Cancer)   Heart disease Father    Kidney disease Father    Diabetes Father    Skin cancer Sister    Stomach cancer Paternal Uncle    Diabetes Maternal Grandmother    Skin cancer Maternal Grandfather    Diabetes Paternal Grandmother    Breast cancer Other        mat great aunts x2; unk age of dx   Lung cancer Cousin    Brain cancer Cousin      Current Outpatient Medications:    atenolol (TENORMIN) 25 MG tablet, TAKE 1 TABLET (25 MG TOTAL) BY MOUTH DAILY. FOR PALPITATIONS., Disp: 90 tablet, Rfl: 2   Biotin 1000 MCG tablet, Take 1,000 mcg by mouth daily., Disp: , Rfl:    blood glucose meter kit and supplies KIT, Dispense based on patient and insurance preference. Use up to four times daily as directed. (FOR ICD-9 250.00, 250.01)., Disp: 1 each, Rfl: 0   cyanocobalamin 500 MCG tablet, Take 1,000 mcg by mouth daily. Reported on 06/12/2016, Disp: , Rfl:    docusate sodium (COLACE) 100  MG capsule, Take 200 mg by mouth daily. Reported on 06/12/2016, Disp: , Rfl:    magnesium 30 MG tablet, Take 30 mg by mouth 2 (two) times daily., Disp: , Rfl:    metFORMIN (GLUCOPHAGE-XR) 500 MG 24 hr tablet, TAKE 1 TABLET (500 MG TOTAL) BY MOUTH DAILY WITH BREAKFAST. FOR DIABETES., Disp: 90 tablet, Rfl: 0   rosuvastatin (CRESTOR) 5 MG tablet, TAKE 1 TABLET (5 MG TOTAL) BY MOUTH EVERY EVENING. FOR CHOLESTEROL., Disp: 90 tablet, Rfl: 2   Semaglutide,0.25 or 0.5MG /DOS, (OZEMPIC, 0.25 OR 0.5 MG/DOSE,) 2 MG/3ML SOPN, inject 0.25 mg into the skin once weekly for 4 weeks, then increase to 0.5 mg once weekly thereafter for diabetes. (Patient not taking: Reported on 10/28/2023), Disp: 9 mL, Rfl: 0   Physical exam:  Vitals:   10/28/23 1425  BP: 129/66  Pulse: 87  Resp: 17  Temp: 97.8 F (36.6 C)  TempSrc: Tympanic  SpO2: 98%  Weight: 203 lb 6.4 oz (92.3 kg)  Height: 5\' 5"  (1.651 m)   Physical Exam Cardiovascular:     Rate and Rhythm: Normal rate and regular rhythm.     Heart sounds: Normal heart sounds.  Pulmonary:     Effort: Pulmonary effort is normal.     Breath sounds: Normal breath sounds.  Abdominal:     General: Bowel sounds are normal.     Palpations: Abdomen is soft.  Skin:    General: Skin is warm and dry.  Neurological:     Mental Status: She is alert and oriented to person, place, and time.     Breast exam: Bruising and induration noted at the site of recent biopsy.  No distinct palpable mass in either breast.  No palpable bilateral axillary adenopathy.      Latest Ref Rng & Units 09/02/2023    9:48 AM  CMP  Glucose 70 - 99 mg/dL 132   BUN 6 - 23 mg/dL 14   Creatinine 4.40 - 1.20 mg/dL 1.02   Sodium 725 - 366 mEq/L 140   Potassium 3.5 - 5.1 mEq/L 4.4   Chloride 96 - 112 mEq/L 104   CO2 19 - 32 mEq/L 29   Calcium 8.4 - 10.5 mg/dL 9.8   Total Protein 6.0 - 8.3 g/dL 7.2   Total Bilirubin  0.2 - 1.2 mg/dL 0.7   Alkaline Phos 39 - 117 U/L 83   AST 0 - 37 U/L 27   ALT  0 - 35 U/L 21       Latest Ref Rng & Units 08/28/2022    8:26 AM  CBC  WBC 4.0 - 10.5 K/uL 4.0   Hemoglobin 12.0 - 15.0 g/dL 16.1   Hematocrit 09.6 - 46.0 % 39.5   Platelets 150.0 - 400.0 K/uL 272.0     No images are attached to the encounter.  [place] LT BREAST BX W LOC DEV 1ST LESION IMG BX SPEC US GUIDE  Addendum Date: 10/23/2023   ADDENDUM REPORT: 10/23/2023 13:31 ADDENDUM: PATHOLOGY revealed: 1. Breast, left, needle core biopsy, 9 o'clock mass : - INVASIVE MAMMARY CARCINOMA, NO SPECIAL TYPE, WITH ASSOCIATED DENSE CHRONIC INFLAMMATION. - OVERALL GRADE: 2. - LYMPHOVASCULAR INVASION: NOT IDENTIFIED. - CANCER LENGTH: 8 MM. - CALCIFICATIONS: NOT IDENTIFIED. - DUCTAL CARCINOMA IN SITU: PRESENT, INTERMEDIATE GRADE. Pathology results are CONCORDANT with imaging findings, per Dr. Ted Mcalpine. Pathology results and recommendations were discussed with patient via telephone on 10/23/2023 by Randa Lynn RN. Patient reported biopsy site doing well with no adverse symptoms, and only slight tenderness at the site. Post biopsy care instructions were reviewed, questions were answered and my direct phone number was provided. Patient was instructed to call [facility] for any additional questions or concerns related to biopsy site. RECOMMENDATIONS: 1. Surgical and oncological consultation. Request for surgical and oncological consultation relayed to Irving Shows RN at [facility] by Randa Lynn RN on 10/23/2023. 2. Recommend pretreatment bilateral breast MRI with and without contrast to assess extent of disease, to evaluate extent of breast disease. Pathology results reported by Randa Lynn RN on 10/23/2023. Electronically Signed   By: Ted Mcalpine M.D.   On: 10/23/2023 13:31   Result Date: 10/23/2023 CLINICAL DATA:  Left breast 9 o'clock indeterminate mass. EXAM: ULTRASOUND GUIDED LEFT BREAST CORE NEEDLE BIOPSY COMPARISON:  Previous exam(s). PROCEDURE: I met with the patient  and we discussed the procedure of ultrasound-guided biopsy, including benefits and alternatives. We discussed the high likelihood of a successful procedure. We discussed the risks of the procedure, including infection, bleeding, tissue injury, clip migration, and inadequate sampling. Informed written consent was given. The usual time-out protocol was performed immediately prior to the procedure. Lesion quadrant: Upper outer quadrant Using sterile technique and 1% Lidocaine as local anesthetic, under direct ultrasound visualization, a 14 gauge spring-loaded device was used to perform biopsy of left breast 9 o'clock mass using a inferior approach. At the conclusion of the procedure heart shaped tissue marker clip was deployed into the biopsy cavity. Follow up 2 view mammogram was performed and dictated separately. IMPRESSION: Ultrasound guided biopsy of left breast.  No apparent complications. Electronically Signed: By: Ted Mcalpine M.D. On: 10/22/2023 13:47   MM CLIP PLACEMENT LEFT  Result Date: 10/22/2023 CLINICAL DATA:  Post ultrasound-guided core needle biopsy left breast 9 o'clock mass. EXAM: 3D DIAGNOSTIC LEFT MAMMOGRAM POST ULTRASOUND BIOPSY COMPARISON:  Previous exam(s). FINDINGS: 3D Mammographic images were obtained following ultrasound guided biopsy of left breast 9 o'clock mass. The biopsy marking clip is in expected position at the site of biopsy. IMPRESSION: Appropriate positioning of the heart shaped biopsy marking clip at the site of biopsy in the in the left breast 9 o'clock mass. Final Assessment: Post Procedure Mammograms for Marker Placement Electronically Signed   By: Ted Mcalpine M.D.   On: 10/22/2023  13:47   MM 3D DIAGNOSTIC MAMMOGRAM UNILATERAL LEFT BREAST  Result Date: 10/08/2023 CLINICAL DATA:  62 year old female presenting as a recall from screening for possible left breast asymmetry. EXAM: DIGITAL DIAGNOSTIC UNILATERAL LEFT MAMMOGRAM WITH TOMOSYNTHESIS AND CAD;  ULTRASOUND LEFT BREAST LIMITED TECHNIQUE: Left digital diagnostic mammography and breast tomosynthesis was performed. The images were evaluated with computer-aided detection. ; Targeted ultrasound examination of the left breast was performed. COMPARISON:  Previous exam(s). ACR Breast Density Category b: There are scattered areas of fibroglandular density. FINDINGS: Mammogram: Left breast: Spot compression tomosynthesis MLO as well as full field mL tomosynthesis views of the left breast were performed demonstrating persistence of an asymmetry in the central left breast measuring approximately 0.6 cm. Ultrasound: Targeted ultrasound performed in the left breast at 9 o'clock retroareolar demonstrating an irregular hypoechoic mass measuring 0.6 x 0.5 x 0.4 cm. This likely corresponds to the mammographic finding. Targeted ultrasound of the left axilla demonstrates normal lymph nodes. IMPRESSION: Indeterminate mass in the left breast at 9 o'clock retroareolar measuring 0.6 cm. RECOMMENDATION: Ultrasound-guided core needle biopsy x1 of the left breast. I have discussed the findings and recommendations with the patient. If applicable, a reminder letter will be sent to the patient regarding the next appointment. BI-RADS CATEGORY  4: Suspicious. Electronically Signed   By: Emmaline Kluver M.D.   On: 10/08/2023 15:12   [place] LIMITED ULTRASOUND INCLUDING AXILLA LEFT BREAST   Result Date: 10/08/2023 CLINICAL DATA:  62 year old female presenting as a recall from screening for possible left breast asymmetry. EXAM: DIGITAL DIAGNOSTIC UNILATERAL LEFT MAMMOGRAM WITH TOMOSYNTHESIS AND CAD; ULTRASOUND LEFT BREAST LIMITED TECHNIQUE: Left digital diagnostic mammography and breast tomosynthesis was performed. The images were evaluated with computer-aided detection. ; Targeted ultrasound examination of the left breast was performed. COMPARISON:  Previous exam(s). ACR Breast Density Category b: There are scattered areas of  fibroglandular density. FINDINGS: Mammogram: Left breast: Spot compression tomosynthesis MLO as well as full field mL tomosynthesis views of the left breast were performed demonstrating persistence of an asymmetry in the central left breast measuring approximately 0.6 cm. Ultrasound: Targeted ultrasound performed in the left breast at 9 o'clock retroareolar demonstrating an irregular hypoechoic mass measuring 0.6 x 0.5 x 0.4 cm. This likely corresponds to the mammographic finding. Targeted ultrasound of the left axilla demonstrates normal lymph nodes. IMPRESSION: Indeterminate mass in the left breast at 9 o'clock retroareolar measuring 0.6 cm. RECOMMENDATION: Ultrasound-guided core needle biopsy x1 of the left breast. I have discussed the findings and recommendations with the patient. If applicable, a reminder letter will be sent to the patient regarding the next appointment. BI-RADS CATEGORY  4: Suspicious. Electronically Signed   By: Emmaline Kluver M.D.   On: 10/08/2023 15:12    Assessment and plan- Patient is a 62 y.o. 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  Mammogram showed a 6 mm breast mass but on core biopsy the mass measured 8 mm grade 2 ER/PR negative HER2 positive invasive mammary carcinoma.  Axillary lymph nodes appear normal.  I would recommend upfront surgery at this time with sentinel lymph node biopsy.  If overall patient has a small ER/PR negative HER2 positive breast cancer measuring less than 3 cm with no evidence of axillary lymph node involvement noted on final pathology I would recommend adjuvant weekly  Taxol for 12 cycles along with Herceptin which will be given every 3 weeks IV.  Herceptin will continue for a whole year.  Discussed acetaminophens of Taxol including  all but not limited to nausea vomiting low blood counts risk of infections and hospitalization as well as peripheral neuropathy and infusion reaction.  Discussed risks and benefits of Herceptin including all but not limited to skin rash diarrhea and possible cardiotoxicity.  He will be monitoring her echocardiogram every 3 months and will obtain baseline echocardiogram at this time.  In a phase II study that enrolled 410 patients with node-negative disease up to 3 cm (31 percent with T1b lesions and 17 percent with T1a lesions), treatment with paclitaxel (80 mg/m2) weekly for 12 weeks plus trastuzumab for one year was associated withThe three-year rate of survival free from invasive disease was 98.7 percent (95% CI 97.6-99.8). The three-year rate of recurrence-free survival was 99.2 percent (95% CI 98.4-100.0). There was no difference seen when patients were stratified by tumor size (<=1 versus >1 cm). Outcomes at longer-term follow-up remained satisfactory, with seven-year and 10-year DFS of 93 and 91 percent, respectively, and ?? of 95 and 94 percent, respectively.  I will tentatively see her back in 3-4 weeks time to discuss final pathology results and further manage treatment.  She is seeing Dr. Claudine Mouton tomorrow and will need port placement at the time of surgery.  Patient will also benefit from adjuvant radiation therapy following completion of 12 weekly cycles of Taxol.  I am referring her for genetic testing today.   Cancer Staging  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 in female, estrogen receptor negative (HCC) Staging form: Breast, AJCC 8th Edition - Clinical stage from 10/28/2023: Stage IA (cT1b, cN0, cM0, G2, ER-, PR-, HER2+) - Signed by Creig Hines, MD on 10/28/2023 Histologic grading system: 3 grade system   Thank you for this kind referral and the opportunity to participate in the care of this patient   Visit Diagnosis 1. Invasive carcinoma of breast (HCC)      Dr. Owens Shark, MD, MPH [facility] [phone] 10/28/2023

## 2023-10-29 ENCOUNTER — Ambulatory Visit (INDEPENDENT_AMBULATORY_CARE_PROVIDER_SITE_OTHER): Payer: Commercial Managed Care - PPO | Admitting: Surgery

## 2023-10-29 ENCOUNTER — Ambulatory Visit: Payer: Self-pay | Admitting: Surgery

## 2023-10-29 ENCOUNTER — Other Ambulatory Visit: Payer: Self-pay

## 2023-10-29 ENCOUNTER — Encounter: Payer: Self-pay | Admitting: Surgery

## 2023-10-29 ENCOUNTER — Telehealth: Payer: Self-pay | Admitting: Surgery

## 2023-10-29 ENCOUNTER — Other Ambulatory Visit: Payer: Self-pay | Admitting: Surgery

## 2023-10-29 VITALS — BP 122/79 | HR 98 | Temp 98.4°F | Ht 65.0 in | Wt 202.4 lb

## 2023-10-29 DIAGNOSIS — C50312 Malignant neoplasm of lower-inner quadrant of left female breast: Secondary | ICD-10-CM

## 2023-10-29 DIAGNOSIS — Z171 Estrogen receptor negative status [ER-]: Secondary | ICD-10-CM

## 2023-10-29 DIAGNOSIS — C50919 Malignant neoplasm of unspecified site of unspecified female breast: Secondary | ICD-10-CM

## 2023-10-29 NOTE — Progress Notes (Signed)
REFERRING PROVIDER: Creig Hines, MD [address] [place],  [place] [address]  PRIMARY PROVIDER:  Doreene Nest, NP  PRIMARY REASON FOR VISIT:  1. 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 in female, estrogen receptor negative (HCC)   2. Family history of breast cancer   3. Family history of stomach cancer   4. Family history of brain cancer   5. Family history of cervical cancer   6. Family history of lung cancer   7. Family history of skin cancer      HISTORY OF PRESENT ILLNESS:   Ms. Demory, a 62 y.o. female, was seen for a [place] cancer genetics consultation at the request of Dr. Smith Robert due to a personal and family history of breast cancer.  Ms. Merenda presents to clinic today to discuss the possibility of a hereditary predisposition to cancer, genetic testing, and to further clarify her future cancer risks, as well as potential cancer risks for family members.   CANCER HISTORY:  Oncology History  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 in female, estrogen receptor negative (HCC)  10/28/2023 Initial Diagnosis   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 in female, estrogen receptor negative (HCC)   10/28/2023 Cancer Staging   Staging form: Breast, AJCC 8th Edition - Clinical stage from 10/28/2023: Stage IA (cT1b, cN0, cM0, G2, ER-, PR-, HER2+) - Signed by Creig Hines, MD on 10/28/2023 Histologic grading system: 3 grade system    RISK FACTORS:  Ovaries intact: yes.  Hysterectomy: no.  Menopausal status: postmenopausal.  Colonoscopy: yes;  few polyps .  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  for diverticulitis.   COLONOSCOPY WITH PROPOFOL N/A 10/01/2022   Procedure: COLONOSCOPY WITH PROPOFOL;  Surgeon: Toney Reil, MD;  Location: [facility] ENDOSCOPY;  Service: Gastroenterology;  Laterality: N/A;   SMALL INTESTINE SURGERY     TUBAL LIGATION     XI ROBOTIC LAPAROSCOPIC ASSISTED APPENDECTOMY N/A 12/29/2022   Procedure: XI ROBOTIC LYSIS OF ADHESIONS AND APPENDECTOMY;  Surgeon: Campbell Lerner, MD;  Location: [facility] ORS;  Service: General;  Laterality: N/A;    FAMILY HISTORY:  We obtained a detailed, 4-generation family history.  Significant diagnoses are listed below: Family History  Problem Relation Age of Onset   Cancer - Cervical Mother        dx 67s; Passed away from Kidney Issues (Not Cancer)   Heart disease Father    Kidney disease Father    Diabetes Father    Skin cancer Sister    Stomach cancer Paternal Uncle    Diabetes Maternal Grandmother    Skin cancer Maternal Grandfather    Diabetes Paternal Grandmother    Breast cancer Other        mat great aunts x2; unk age of dx   Lung cancer Cousin    Brain cancer Cousin  Ms. Chue has 2 daughters and 3 sons. She has 3 full sisters, 2 paternal half sisters, 2 paternal half brothers. One of her full sisters has had skin cancer.  Ms. Minney's mother had cervical cancer in her 43s and passed at 50. Maternal grandfather had skin cancer. Maternal grandmother had two sisters that had breast cancer, unknown ages of diagnosis.  Ms. Cahalan's father passed at 32. A paternal uncle had stomach cancer. A paternal cousin had brain cancer. A paternal cousin had lung cancer.  Ms. Jameson is unaware of previous family history of genetic testing for hereditary cancer risks. There is no reported Ashkenazi Jewish  ancestry. There is no known consanguinity.    GENETIC COUNSELING ASSESSMENT: Ms. Mallicoat is a 62 y.o. female with a personal and family history of breast cancer which is somewhat suggestive of a hereditary cancer syndrome and predisposition to cancer. We, therefore, discussed and recommended the following at today's visit.   DISCUSSION: We discussed that approximately 10% of breast cancer is hereditary. Most cases of hereditary breast cancer are associated with BRCA1/BRCA2 genes, although there are other genes associated with hereditary cancer as well. Cancers and risks are gene specific. We discussed that testing is beneficial for several reasons including knowing about cancer risks, identifying potential screening and risk-reduction options that may be appropriate, and to understand if other family members could be at risk for cancer and allow them to undergo genetic testing.   We reviewed the characteristics, features and inheritance patterns of hereditary cancer syndromes. We also discussed genetic testing, including the appropriate family members to test, the process of testing, insurance coverage and turn-around-time for results. We discussed the implications of a negative, positive and/or variant of uncertain significant result. We recommended Ms. Dipinto pursue genetic testing for the [facility] BRCAPlus+CancerNext-Expanded+RNA gene panel.   Based on Ms. Pancake's personal and family history of cancer, she meets medical criteria for genetic testing. Despite that she meets criteria, she may still have an out of pocket cost.   PLAN: After considering the risks, benefits, and limitations, Ms. Sotomayor provided informed consent to pursue genetic testing and the blood sample was sent to [facility] for analysis of the BRCAPlus+CancerNext-Expanded+RNA panel. Initial results should be available within approximately 1 weeks' time, at which point they will be disclosed by telephone to Ms. Nickelson, as will any  additional recommendations warranted by these results. Ms. Starliper will receive a summary of her genetic counseling visit and a copy of her results once available. This information will also be available in Epic.   Ms. Railey's questions were answered to her satisfaction today. Our contact information was provided should additional questions or concerns arise. Thank you for the referral and allowing [place] to share in the care of your patient.   Lacy Duverney, MS, [facility] Genetic Counselor [email or web address] Phone: [phone]  The patient was seen for a total of 12 minutes in face-to-face genetic counseling.  Dr. Blake Divine was available for discussion regarding this case.   _______________________________________________________________________ For Office Staff:  Number of people involved in session: 1 Was an Intern/ student involved with case: no

## 2023-10-29 NOTE — Patient Instructions (Signed)
We have spoken today about removing a lump in your breast. This will be done by Dr. Claudine Mouton at [facility].  You will most likely be able to leave the hospital several hours after your surgery. Rarely, a patient needs to stay over night but this is a possibility.  Plan to tentatively be off work for 1-2 weeks following the surgery and may return with approximately 2 more weeks of a lifting restriction, no greater than 15 lbs.  Please see your Blue surgery sheet for more information. Our surgery scheduler will call you to look at surgery dates and to go over information.   If you have FMLA or Disability paperwork that needs to be filled out, please have your company fax your paperwork to [phone] or you may drop this by either office. This paperwork will be filled out within 3 days after your surgery has been completed.    Lumpectomy A lumpectomy is a form of "breast conserving" or "breast preservation" surgery. It may also be referred to as a partial mastectomy. During a lumpectomy, the portion of the breast that contains the cancerous tumor or breast mass (the lump) is removed. Some normal tissue around the lump may also be removed to make sure all of the tumor has been removed.  LET [facility] CARE PROVIDER KNOW ABOUT: Any allergies you have. All medicines you are taking, including vitamins, herbs, eye drops, creams, and over-the-counter medicines. Previous problems you or members of your family have had with the use of anesthetics. Any blood disorders you have. Previous surgeries you have had. Medical conditions you have. RISKS AND COMPLICATIONS Generally, this is a safe procedure. However, problems can occur and include: Bleeding. Infection. Pain. Temporary swelling. Change in the shape of the breast, particularly if a large portion is removed. BEFORE THE PROCEDURE Ask your health care provider about changing or stopping your regular medicines. This is especially important if you  are taking diabetes medicines or blood thinners. Do not eat or drink anything after midnight on the night before the procedure or as directed by your health care provider. Ask your health care provider if you can take a sip of water with any approved medicines. On the day of surgery, your health care provider will use a mammogram or ultrasound to locate and mark the tumor in your breast. These markings on your breast will show where the cut (incision) will be made. PROCEDURE  An IV tube will be put into one of your veins. You may be given medicine to help you relax before the surgery (sedative). You will be given one of the following: A medicine that numbs the area (local anesthetic). A medicine that makes you fall asleep (general anesthetic). Your health care provider will use a kind of electric scalpel that uses heat to minimize bleeding (electrocautery knife). A curved incision (like a smile or frown) that follows the natural curve of your breast is made, to allow for minimal scarring and better healing. The tumor will be removed with some of the surrounding tissue. This will be sent to the lab for analysis. Your health care provider may also remove your lymph nodes at this time if needed. Sometimes, but not always, a rubber tube called a drain will be surgically inserted into your breast area or armpit to collect excess fluid that may accumulate in the space where the tumor was. This drain is connected to a plastic bulb on the outside of your body. This drain creates suction to help remove  the fluid. The incisions will be closed with stitches (sutures). A bandage may be placed over the incisions. AFTER THE PROCEDURE You will be taken to the recovery area. You will be given medicine for pain. A small rubber drain may be placed in the breast for 2-3 days to prevent a collection of blood (hematoma) from developing in the breast. You will be given instructions on caring for the drain before you go  home. A pressure bandage (dressing) will be applied for 1-2 days to prevent bleeding. Ask your health care provider how to care for your bandage at home.   This information is not intended to replace advice given to you by your health care provider. Make sure you discuss any questions you have with your health care provider.   Document Released: 01/05/2007 Document Revised: 12/15/2014 Document Reviewed: 04/29/2013 Elsevier Interactive Patient Education [employer]

## 2023-10-29 NOTE — Telephone Encounter (Signed)
Patient has been advised of Pre-Admission date/time, and Surgery date at [facility]  Surgery Date: 11/04/23 Preadmission Testing Date: 10/30/23 (phone 1p-4p)  Patient has been made aware to arrive at 7:30 am at [facility] as will be having SLN bx injection done prior to her surgery on 11/04/23.    Patient also reminded of her breast tag to be placed at [facility] 11/02/23.

## 2023-10-30 ENCOUNTER — Encounter
Admission: RE | Admit: 2023-10-30 | Discharge: 2023-10-30 | Disposition: A | Discharge status: Home / Self Care | Payer: Commercial Managed Care - PPO | Source: Ambulatory Visit | Attending: Surgery | Admitting: Surgery

## 2023-10-30 ENCOUNTER — Encounter: Payer: Self-pay | Admitting: *Deleted

## 2023-10-30 DIAGNOSIS — D649 Anemia, unspecified: Secondary | ICD-10-CM

## 2023-10-30 DIAGNOSIS — Z01812 Encounter for preprocedural laboratory examination: Secondary | ICD-10-CM

## 2023-10-30 DIAGNOSIS — E119 Type 2 diabetes mellitus without complications: Secondary | ICD-10-CM

## 2023-10-30 DIAGNOSIS — Z01810 Encounter for preprocedural cardiovascular examination: Secondary | ICD-10-CM

## 2023-10-30 DIAGNOSIS — Z01818 Encounter for other preprocedural examination: Secondary | ICD-10-CM

## 2023-10-30 HISTORY — DX: Anemia, unspecified: D64.9

## 2023-10-30 HISTORY — DX: Low back pain, unspecified: M54.50

## 2023-10-30 HISTORY — DX: Nontoxic single thyroid nodule: E04.1

## 2023-10-30 HISTORY — DX: Obesity, unspecified: E66.9

## 2023-10-30 HISTORY — DX: Malignant neoplasm of lower-inner quadrant of left female breast: Z17.1

## 2023-10-30 HISTORY — DX: Gastro-esophageal reflux disease without esophagitis: K21.9

## 2023-10-30 HISTORY — DX: Malignant neoplasm of lower-inner quadrant of left female breast: C50.312

## 2023-10-30 HISTORY — DX: Pure hypercholesterolemia, unspecified: E78.00

## 2023-10-30 HISTORY — DX: Supraventricular tachycardia, unspecified: I47.10

## 2023-10-30 HISTORY — DX: Personal history of urinary calculi: Z87.442

## 2023-10-30 HISTORY — DX: Other chronic pain: G89.29

## 2023-10-30 HISTORY — DX: Rectal polyp: K62.1

## 2023-10-30 NOTE — Patient Instructions (Signed)
Your procedure is scheduled on:11-04-23 Wednesday Report to the Registration Desk on the 1st floor of the Medical Mall.Then proceed to the Radiology Desk (2nd desk on right). Arrive at 7:30 am   REMEMBER: Instructions that are not followed completely may result in serious medical risk, up to and including death; or upon the discretion of your surgeon and anesthesiologist your surgery may need to be rescheduled.  Do not eat food OR drink any liquids after midnight the night before surgery.  No gum chewing or hard candies.  One week prior to surgery:Stop NOW (10-30-23) Stop Anti-inflammatories (NSAIDS) such as Advil, Aleve, Ibuprofen, Motrin, Naproxen, Naprosyn and Aspirin based products such as Excedrin, Goody's Powder, BC Powder. Stop ANY OVER THE COUNTER supplements until after surgery (Biotin, Magnesium, Vitamin B6)  You may however, continue to take Tylenol if needed for pain up until the day of surgery.  Stop metFORMIN (GLUCOPHAGE-XR) 2 days prior to surgery-Last dose will be on 11-01-23 Sunday  Continue taking all of your other prescription medications up until the day of surgery.  Do NOT take any medication the day of surgery   No Alcohol for 24 hours before or after surgery.  No Smoking including e-cigarettes for 24 hours before surgery.  No chewable tobacco products for at least 6 hours before surgery.  No nicotine patches on the day of surgery.  Do not use any "recreational" drugs for at least a week (preferably 2 weeks) before your surgery.  Please be advised that the combination of cocaine and anesthesia may have negative outcomes, up to and including death. If you test positive for cocaine, your surgery will be cancelled.  On the morning of surgery brush your teeth with toothpaste and water, you may rinse your mouth with mouthwash if you wish. Do not swallow any toothpaste or mouthwash.  Use CHG Soap as directed on instruction sheet.  Do not wear jewelry, make-up,  hairpins, clips or nail polish.  For welded (permanent) jewelry: bracelets, anklets, waist bands, etc.  Please have this removed prior to surgery.  If it is not removed, there is a chance that hospital personnel will need to cut it off on the day of surgery.  Do not wear lotions, powders, or perfumes.   Do not shave body hair from the neck down 48 hours before surgery.  Contact lenses, hearing aids and dentures may not be worn into surgery.  Do not bring valuables to the hospital. [facility] is not responsible for any missing/lost belongings or valuables.   Notify your doctor if there is any change in your medical condition (cold, fever, infection).  Wear comfortable clothing (specific to your surgery type) to the hospital.  After surgery, you can help prevent lung complications by doing breathing exercises.  Take deep breaths and cough every 1-2 hours. Your doctor may order a device called an Incentive Spirometer to help you take deep breaths. When coughing or sneezing, hold a pillow firmly against your incision with both hands. This is called "splinting." Doing this helps protect your incision. It also decreases belly discomfort.  If you are being admitted to the hospital overnight, leave your suitcase in the car. After surgery it may be brought to your room.  In case of increased patient census, it may be necessary for you, the patient, to continue your postoperative care in the Same Day Surgery department.  If you are being discharged the day of surgery, you will not be allowed to drive home. You will need a responsible  individual to drive you home and stay with you for 24 hours after surgery.   If you are taking public transportation, you will need to have a responsible individual with you.  Please call the Pre-admissions Testing Dept. at [phone] if you have any questions about these instructions.  Surgery Visitation Policy:  Patients having surgery or a procedure may  have two visitors.  Children under the age of 42 must have an adult with them who is not the patient.     Preparing for Surgery with CHLORHEXIDINE GLUCONATE (CHG) Soap  Chlorhexidine Gluconate (CHG) Soap  o An antiseptic cleaner that kills germs and bonds with the skin to continue killing germs even after washing  o Used for showering the night before surgery and morning of surgery  Before surgery, you can play an important role by reducing the number of germs on your skin.  CHG (Chlorhexidine gluconate) soap is an antiseptic cleanser which kills germs and bonds with the skin to continue killing germs even after washing.  Please do not use if you have an allergy to CHG or antibacterial soaps. If your skin becomes reddened/irritated stop using the CHG.  1. Shower the NIGHT BEFORE SURGERY and the MORNING OF SURGERY with CHG soap.  2. If you choose to wash your hair, wash your hair first as usual with your normal shampoo.  3. After shampooing, rinse your hair and body thoroughly to remove the shampoo.  4. Use CHG as you would any other liquid soap. You can apply CHG directly to the skin and wash gently with a scrungie or a clean washcloth.  5. Apply the CHG soap to your body only from the neck down. Do not use on open wounds or open sores. Avoid contact with your eyes, ears, mouth, and genitals (private parts). Wash face and genitals (private parts) with your normal soap.  6. Wash thoroughly, paying special attention to the area where your surgery will be performed.  7. Thoroughly rinse your body with warm water.  8. Do not shower/wash with your normal soap after using and rinsing off the CHG soap.  9. Pat yourself dry with a clean towel.  10. Wear clean pajamas to bed the night before surgery.  12. Place clean sheets on your bed the night of your first shower and do not sleep with pets.  13. Shower again with the CHG soap on the day of surgery prior to arriving at the  hospital.  14. Do not apply any deodorants/lotions/powders.  15. Please wear clean clothes to the hospital.

## 2023-10-30 NOTE — Progress Notes (Signed)
Lumpectomy scheduled for 11/27.   Appt. With OT moved to 2 weeks after surgery.   Appt. Details given.

## 2023-10-30 NOTE — Pre-Procedure Instructions (Addendum)
Pt states during anesthesia interview that she has stopped her Metformin and Crestor 2 days ago. I asked pt why she stopped these medications and she states that she wants her system clean of medication prior to surgery- I explained to pt that she really needs to restart the Metformin and Crestor but pt is adamant that she is not going to. Pt also states that she stopped her Ozempic a couple of weeks ago and I did explain to pt that she did not need to restart this until after her surgery is over. Pt verbalized understanding

## 2023-11-02 ENCOUNTER — Encounter
Admission: RE | Admit: 2023-11-02 | Discharge: 2023-11-02 | Disposition: A | Discharge status: Home / Self Care | Payer: Commercial Managed Care - PPO | Source: Ambulatory Visit | Attending: Surgery | Admitting: Surgery

## 2023-11-02 ENCOUNTER — Ambulatory Visit
Admission: RE | Admit: 2023-11-02 | Discharge: 2023-11-02 | Disposition: A | Discharge status: Home / Self Care | Payer: Commercial Managed Care - PPO | Source: Ambulatory Visit | Attending: Surgery | Admitting: Surgery

## 2023-11-02 DIAGNOSIS — C50912 Malignant neoplasm of unspecified site of left female breast: Secondary | ICD-10-CM | POA: Insufficient documentation

## 2023-11-02 DIAGNOSIS — Z01818 Encounter for other preprocedural examination: Secondary | ICD-10-CM | POA: Insufficient documentation

## 2023-11-02 DIAGNOSIS — Z01810 Encounter for preprocedural cardiovascular examination: Secondary | ICD-10-CM

## 2023-11-02 DIAGNOSIS — D649 Anemia, unspecified: Secondary | ICD-10-CM | POA: Insufficient documentation

## 2023-11-02 DIAGNOSIS — Z01812 Encounter for preprocedural laboratory examination: Secondary | ICD-10-CM

## 2023-11-02 DIAGNOSIS — C50919 Malignant neoplasm of unspecified site of unspecified female breast: Secondary | ICD-10-CM | POA: Diagnosis present

## 2023-11-02 DIAGNOSIS — E119 Type 2 diabetes mellitus without complications: Secondary | ICD-10-CM | POA: Insufficient documentation

## 2023-11-02 LAB — CBC
HCT: 41 % (ref 36.0–46.0)
Hemoglobin: 13.9 g/dL (ref 12.0–15.0)
MCH: 30.6 pg (ref 26.0–34.0)
MCHC: 33.9 g/dL (ref 30.0–36.0)
MCV: 90.3 fL (ref 80.0–100.0)
Platelets: 324 10*3/uL (ref 150–400)
RBC: 4.54 MIL/uL (ref 3.87–5.11)
RDW: 13.2 % (ref 11.5–15.5)
WBC: 6 10*3/uL (ref 4.0–10.5)
nRBC: 0 % (ref 0.0–0.2)

## 2023-11-02 MED ORDER — LIDOCAINE HCL 1 % IJ SOLN
10.0000 mL | Freq: Once | INTRAMUSCULAR | Status: AC
  Administered 2023-11-02: 10 mL
  Filled 2023-11-02: qty 10

## 2023-11-03 ENCOUNTER — Inpatient Hospital Stay: Payer: Commercial Managed Care - PPO

## 2023-11-04 ENCOUNTER — Ambulatory Visit: Payer: Commercial Managed Care - PPO | Admitting: Anesthesiology

## 2023-11-04 ENCOUNTER — Ambulatory Visit: Payer: Commercial Managed Care - PPO | Admitting: Occupational Therapy

## 2023-11-04 ENCOUNTER — Ambulatory Visit
Admission: RE | Admit: 2023-11-04 | Discharge: 2023-11-04 | Disposition: A | Discharge status: Home / Self Care | Payer: Commercial Managed Care - PPO | Source: Ambulatory Visit | Attending: Surgery | Admitting: Surgery

## 2023-11-04 ENCOUNTER — Encounter
Admission: RE | Disposition: A | Discharge status: Home / Self Care | Payer: Self-pay | Source: Ambulatory Visit | Attending: Surgery

## 2023-11-04 ENCOUNTER — Encounter: Payer: Self-pay | Admitting: Surgery

## 2023-11-04 ENCOUNTER — Ambulatory Visit: Payer: Commercial Managed Care - PPO | Admitting: Urgent Care

## 2023-11-04 ENCOUNTER — Other Ambulatory Visit: Payer: Self-pay

## 2023-11-04 ENCOUNTER — Ambulatory Visit: Payer: Commercial Managed Care - PPO

## 2023-11-04 ENCOUNTER — Ambulatory Visit
Admission: RE | Admit: 2023-11-04 | Discharge: 2023-11-04 | Discharge status: Home / Self Care | Payer: Commercial Managed Care - PPO | Source: Ambulatory Visit | Attending: Surgery | Admitting: Surgery

## 2023-11-04 DIAGNOSIS — E119 Type 2 diabetes mellitus without complications: Secondary | ICD-10-CM | POA: Diagnosis not present

## 2023-11-04 DIAGNOSIS — K5900 Constipation, unspecified: Secondary | ICD-10-CM | POA: Insufficient documentation

## 2023-11-04 DIAGNOSIS — Z833 Family history of diabetes mellitus: Secondary | ICD-10-CM | POA: Insufficient documentation

## 2023-11-04 DIAGNOSIS — Z803 Family history of malignant neoplasm of breast: Secondary | ICD-10-CM | POA: Diagnosis not present

## 2023-11-04 DIAGNOSIS — Z87891 Personal history of nicotine dependence: Secondary | ICD-10-CM | POA: Insufficient documentation

## 2023-11-04 DIAGNOSIS — Z1731 Human epidermal growth factor receptor 2 positive status: Secondary | ICD-10-CM | POA: Insufficient documentation

## 2023-11-04 DIAGNOSIS — Z79899 Other long term (current) drug therapy: Secondary | ICD-10-CM | POA: Diagnosis not present

## 2023-11-04 DIAGNOSIS — K219 Gastro-esophageal reflux disease without esophagitis: Secondary | ICD-10-CM | POA: Diagnosis not present

## 2023-11-04 DIAGNOSIS — C50312 Malignant neoplasm of lower-inner quadrant of left female breast: Secondary | ICD-10-CM | POA: Diagnosis not present

## 2023-11-04 DIAGNOSIS — C50919 Malignant neoplasm of unspecified site of unspecified female breast: Secondary | ICD-10-CM

## 2023-11-04 DIAGNOSIS — Z171 Estrogen receptor negative status [ER-]: Secondary | ICD-10-CM | POA: Insufficient documentation

## 2023-11-04 DIAGNOSIS — E038 Other specified hypothyroidism: Secondary | ICD-10-CM | POA: Diagnosis not present

## 2023-11-04 DIAGNOSIS — Z6833 Body mass index (BMI) 33.0-33.9, adult: Secondary | ICD-10-CM | POA: Diagnosis not present

## 2023-11-04 DIAGNOSIS — Z01818 Encounter for other preprocedural examination: Secondary | ICD-10-CM

## 2023-11-04 DIAGNOSIS — E669 Obesity, unspecified: Secondary | ICD-10-CM | POA: Diagnosis not present

## 2023-11-04 DIAGNOSIS — Z7984 Long term (current) use of oral hypoglycemic drugs: Secondary | ICD-10-CM | POA: Insufficient documentation

## 2023-11-04 DIAGNOSIS — Z1722 Progesterone receptor negative status: Secondary | ICD-10-CM | POA: Diagnosis not present

## 2023-11-04 DIAGNOSIS — D0512 Intraductal carcinoma in situ of left breast: Secondary | ICD-10-CM | POA: Diagnosis present

## 2023-11-04 HISTORY — PX: PORTACATH PLACEMENT: SHX2246

## 2023-11-04 HISTORY — PX: AXILLARY SENTINEL NODE BIOPSY: SHX5738

## 2023-11-04 HISTORY — PX: BREAST LUMPECTOMY WITH RADIOFREQUENCY TAG IDENTIFICATION: SHX6884

## 2023-11-04 LAB — GLUCOSE, CAPILLARY
Glucose-Capillary: 139 mg/dL — ABNORMAL HIGH (ref 70–99)
Glucose-Capillary: 143 mg/dL — ABNORMAL HIGH (ref 70–99)

## 2023-11-04 SURGERY — BREAST LUMPECTOMY WITH RADIOFREQUENCY TAG IDENTIFICATION
Anesthesia: General | Site: Chest | Laterality: Right

## 2023-11-04 MED ORDER — ISOSULFAN BLUE 1 % ~~LOC~~ SOLN
SUBCUTANEOUS | Status: DC | PRN
  Administered 2023-11-04: 5 mL via SUBCUTANEOUS

## 2023-11-04 MED ORDER — SODIUM CHLORIDE (PF) 0.9 % IJ SOLN
INTRAMUSCULAR | Status: AC
  Filled 2023-11-04: qty 50

## 2023-11-04 MED ORDER — CELECOXIB 200 MG PO CAPS
ORAL_CAPSULE | ORAL | Status: AC
  Filled 2023-11-04: qty 1

## 2023-11-04 MED ORDER — FENTANYL CITRATE (PF) 100 MCG/2ML IJ SOLN
INTRAMUSCULAR | Status: AC
  Filled 2023-11-04: qty 2

## 2023-11-04 MED ORDER — IBUPROFEN 800 MG PO TABS: 800.0000 mg | ORAL_TABLET | Freq: Three times a day (TID) | ORAL | 0 refills | Status: AC | PRN

## 2023-11-04 MED ORDER — LIDOCAINE HCL (CARDIAC) PF 100 MG/5ML IV SOSY: PREFILLED_SYRINGE | INTRAVENOUS | Status: DC | PRN

## 2023-11-04 MED ORDER — SODIUM CHLORIDE (PF) 0.9 % IJ SOLN
INTRAMUSCULAR | Status: AC
  Filled 2023-11-04: qty 10

## 2023-11-04 MED ORDER — ISOSULFAN BLUE 1 % ~~LOC~~ SOLN
SUBCUTANEOUS | Status: AC
  Filled 2023-11-04: qty 5

## 2023-11-04 MED ORDER — ONDANSETRON HCL 4 MG/2ML IJ SOLN
INTRAMUSCULAR | Status: DC | PRN
  Administered 2023-11-04: 4 mg via INTRAVENOUS

## 2023-11-04 MED ORDER — OXYCODONE HCL 5 MG PO TABS
5.0000 mg | ORAL_TABLET | Freq: Once | ORAL | Status: AC | PRN
  Administered 2023-11-04: 5 mg via ORAL

## 2023-11-04 MED ORDER — DEXAMETHASONE SODIUM PHOSPHATE 10 MG/ML IJ SOLN
INTRAMUSCULAR | Status: AC
  Filled 2023-11-04: qty 1

## 2023-11-04 MED ORDER — HYDROMORPHONE HCL 1 MG/ML IJ SOLN
INTRAMUSCULAR | Status: AC
  Filled 2023-11-04: qty 1

## 2023-11-04 MED ORDER — OXYCODONE HCL 5 MG/5ML PO SOLN: 5.0000 mg | Freq: Once | ORAL | Status: AC | PRN

## 2023-11-04 MED ORDER — PROPOFOL 1000 MG/100ML IV EMUL
INTRAVENOUS | Status: AC
  Filled 2023-11-04: qty 100

## 2023-11-04 MED ORDER — ACETAMINOPHEN 10 MG/ML IV SOLN: 1000.0000 mg | Freq: Once | INTRAVENOUS | Status: DC | PRN

## 2023-11-04 MED ORDER — ACETAMINOPHEN 500 MG PO TABS
1000.0000 mg | ORAL_TABLET | ORAL | Status: AC
  Administered 2023-11-04: 1000 mg via ORAL

## 2023-11-04 MED ORDER — ROCURONIUM BROMIDE 10 MG/ML (PF) SYRINGE
PREFILLED_SYRINGE | INTRAVENOUS | Status: AC
  Filled 2023-11-04: qty 10

## 2023-11-04 MED ORDER — MIDAZOLAM HCL 2 MG/2ML IJ SOLN
INTRAMUSCULAR | Status: DC | PRN
  Administered 2023-11-04: 2 mg via INTRAVENOUS

## 2023-11-04 MED ORDER — CHLORHEXIDINE GLUCONATE CLOTH 2 % EX PADS
6.0000 | MEDICATED_PAD | Freq: Once | CUTANEOUS | Status: DC
Start: 2023-11-04 — End: 2023-11-04

## 2023-11-04 MED ORDER — GABAPENTIN 300 MG PO CAPS
300.0000 mg | ORAL_CAPSULE | ORAL | Status: AC
Start: 2023-11-04 — End: 2023-11-04
  Administered 2023-11-04: 300 mg via ORAL

## 2023-11-04 MED ORDER — PHENYLEPHRINE 80 MCG/ML (10ML) SYRINGE FOR IV PUSH (FOR BLOOD PRESSURE SUPPORT)
PREFILLED_SYRINGE | INTRAVENOUS | Status: DC | PRN
  Administered 2023-11-04: 100 ug via INTRAVENOUS
  Administered 2023-11-04 (×2): 160 ug via INTRAVENOUS
  Administered 2023-11-04 (×4): 100 ug via INTRAVENOUS

## 2023-11-04 MED ORDER — CEFAZOLIN SODIUM-DEXTROSE 2-4 GM/100ML-% IV SOLN
2.0000 g | INTRAVENOUS | Status: AC
  Administered 2023-11-04: 2 g via INTRAVENOUS

## 2023-11-04 MED ORDER — KETOROLAC TROMETHAMINE 30 MG/ML IJ SOLN
INTRAMUSCULAR | Status: AC
  Filled 2023-11-04: qty 1

## 2023-11-04 MED ORDER — FENTANYL CITRATE (PF) 100 MCG/2ML IJ SOLN
25.0000 ug | INTRAMUSCULAR | Status: DC | PRN
  Administered 2023-11-04: 50 ug via INTRAVENOUS

## 2023-11-04 MED ORDER — ONDANSETRON HCL 4 MG/2ML IJ SOLN
INTRAMUSCULAR | Status: AC
  Filled 2023-11-04: qty 2

## 2023-11-04 MED ORDER — LACTATED RINGERS IV SOLN: INTRAVENOUS | Status: AC

## 2023-11-04 MED ORDER — OXYCODONE HCL 5 MG PO TABS: 5.0000 mg | ORAL_TABLET | Freq: Four times a day (QID) | ORAL | 0 refills | Status: DC | PRN

## 2023-11-04 MED ORDER — LIDOCAINE HCL (CARDIAC) PF 100 MG/5ML IV SOSY
PREFILLED_SYRINGE | INTRAVENOUS | Status: DC | PRN
  Administered 2023-11-04: 100 mg via INTRAVENOUS

## 2023-11-04 MED ORDER — TECHNETIUM TC 99M TILMANOCEPT KIT
1.0900 | PACK | Freq: Once | INTRAVENOUS | Status: AC | PRN
  Administered 2023-11-04: 1.09 via INTRADERMAL

## 2023-11-04 MED ORDER — ACETAMINOPHEN 500 MG PO TABS
ORAL_TABLET | ORAL | Status: AC
  Filled 2023-11-04: qty 2

## 2023-11-04 MED ORDER — ONDANSETRON HCL 4 MG/2ML IJ SOLN: 4.0000 mg | Freq: Once | INTRAMUSCULAR | Status: DC | PRN

## 2023-11-04 MED ORDER — GABAPENTIN 300 MG PO CAPS
ORAL_CAPSULE | ORAL | Status: AC
  Filled 2023-11-04: qty 1

## 2023-11-04 MED ORDER — BUPIVACAINE LIPOSOME 1.3 % IJ SUSP: 20.0000 mL | Freq: Once | INTRAMUSCULAR | Status: DC

## 2023-11-04 MED ORDER — BUPIVACAINE-EPINEPHRINE (PF) 0.25% -1:200000 IJ SOLN
INTRAMUSCULAR | Status: AC
  Filled 2023-11-04: qty 30

## 2023-11-04 MED ORDER — EPHEDRINE SULFATE-NACL 50-0.9 MG/10ML-% IV SOSY
PREFILLED_SYRINGE | INTRAVENOUS | Status: DC | PRN
  Administered 2023-11-04: 5 mg via INTRAVENOUS
  Administered 2023-11-04: 10 mg via INTRAVENOUS

## 2023-11-04 MED ORDER — LACTATED RINGERS IV SOLN: INTRAVENOUS | Status: DC | PRN

## 2023-11-04 MED ORDER — DROPERIDOL 2.5 MG/ML IJ SOLN
INTRAMUSCULAR | Status: AC
  Filled 2023-11-04: qty 2

## 2023-11-04 MED ORDER — DEXAMETHASONE SODIUM PHOSPHATE 10 MG/ML IJ SOLN
INTRAMUSCULAR | Status: DC | PRN
  Administered 2023-11-04: 4 mg via INTRAVENOUS

## 2023-11-04 MED ORDER — HYDROMORPHONE HCL 1 MG/ML IJ SOLN
INTRAMUSCULAR | Status: DC | PRN
  Administered 2023-11-04: .5 mg via INTRAVENOUS

## 2023-11-04 MED ORDER — CHLORHEXIDINE GLUCONATE 0.12 % MT SOLN
OROMUCOSAL | Status: AC
  Filled 2023-11-04: qty 15

## 2023-11-04 MED ORDER — LIDOCAINE HCL (PF) 2 % IJ SOLN
INTRAMUSCULAR | Status: AC
  Filled 2023-11-04: qty 5

## 2023-11-04 MED ORDER — HEPARIN 5000 UNITS IN NS 1000 ML (FLUSH)
INTRAMUSCULAR | Status: DC | PRN
  Administered 2023-11-04: 4 mL via INTRAMUSCULAR

## 2023-11-04 MED ORDER — KETOROLAC TROMETHAMINE 30 MG/ML IJ SOLN
INTRAMUSCULAR | Status: DC | PRN
  Administered 2023-11-04: 30 mg via INTRAVENOUS

## 2023-11-04 MED ORDER — FENTANYL CITRATE (PF) 100 MCG/2ML IJ SOLN
INTRAMUSCULAR | Status: DC | PRN
  Administered 2023-11-04 (×2): 25 ug via INTRAVENOUS
  Administered 2023-11-04: 50 ug via INTRAVENOUS

## 2023-11-04 MED ORDER — SODIUM CHLORIDE 0.9 % IV SOLN
INTRAVENOUS | Status: DC | PRN
  Administered 2023-11-04: 10 mL via INTRAMUSCULAR

## 2023-11-04 MED ORDER — CEFAZOLIN SODIUM-DEXTROSE 2-4 GM/100ML-% IV SOLN
INTRAVENOUS | Status: AC
  Filled 2023-11-04: qty 100

## 2023-11-04 MED ORDER — PROPOFOL 10 MG/ML IV BOLUS
INTRAVENOUS | Status: DC | PRN
  Administered 2023-11-04: 100 mg via INTRAVENOUS

## 2023-11-04 MED ORDER — BUPIVACAINE-EPINEPHRINE (PF) 0.25% -1:200000 IJ SOLN
INTRAMUSCULAR | Status: DC | PRN
  Administered 2023-11-04: 30 mL

## 2023-11-04 MED ORDER — ORAL CARE MOUTH RINSE: 15.0000 mL | Freq: Once | OROMUCOSAL | Status: AC

## 2023-11-04 MED ORDER — CHLORHEXIDINE GLUCONATE 0.12 % MT SOLN
15.0000 mL | Freq: Once | OROMUCOSAL | Status: AC
  Administered 2023-11-04: 15 mL via OROMUCOSAL

## 2023-11-04 MED ORDER — DROPERIDOL 2.5 MG/ML IJ SOLN
0.6250 mg | Freq: Once | INTRAMUSCULAR | Status: AC
  Administered 2023-11-04: 0.625 mg via INTRAVENOUS

## 2023-11-04 MED ORDER — HEPARIN SODIUM (PORCINE) 5000 UNIT/ML IJ SOLN
INTRAMUSCULAR | Status: AC
  Filled 2023-11-04: qty 2

## 2023-11-04 MED ORDER — SODIUM CHLORIDE 0.9 % IV SOLN: INTRAVENOUS | Status: DC

## 2023-11-04 MED ORDER — CELECOXIB 200 MG PO CAPS
200.0000 mg | ORAL_CAPSULE | ORAL | Status: AC
Start: 2023-11-04 — End: 2023-11-04
  Administered 2023-11-04: 200 mg via ORAL

## 2023-11-04 MED ORDER — MIDAZOLAM HCL 2 MG/2ML IJ SOLN
INTRAMUSCULAR | Status: AC
Start: 2023-11-04 — End: ?
  Filled 2023-11-04: qty 2

## 2023-11-04 MED ORDER — OXYCODONE HCL 5 MG PO TABS
ORAL_TABLET | ORAL | Status: AC
  Filled 2023-11-04: qty 1

## 2023-11-04 SURGICAL SUPPLY — 50 items
5 PAIRS OF YELLOW SUTURE CLAMP (MISCELLANEOUS) ×2
APPLIER CLIP 9.375 SM OPEN (CLIP)
BAG DECANTER FOR FLEXI CONT (MISCELLANEOUS) ×2 IMPLANT
BLADE SURG 15 STRL LF DISP TIS (BLADE) ×2 IMPLANT
CHLORAPREP W/TINT 26 (MISCELLANEOUS) ×2 IMPLANT
CLAMP SUTURE YELLOW 5 PAIRS (MISCELLANEOUS) ×2 IMPLANT
CLIP APPLIE 9.375 SM OPEN (CLIP) IMPLANT
CNTNR URN SCR LID CUP LEK RST (MISCELLANEOUS) IMPLANT
COVER LIGHT HANDLE STERIS (MISCELLANEOUS) ×4 IMPLANT
COVER PROBE GAMMA FINDER SLV (MISCELLANEOUS) ×2 IMPLANT
DERMABOND ADVANCED .7 DNX12 (GAUZE/BANDAGES/DRESSINGS) ×2 IMPLANT
DEVICE DUBIN SPECIMEN MAMMOGRA (MISCELLANEOUS) ×2 IMPLANT
DISSECTOR SURG LIGASURE 21 (MISCELLANEOUS) IMPLANT
DRAPE C-ARM XRAY 36X54 (DRAPES) ×2 IMPLANT
DRAPE LAPAROTOMY TRNSV 106X77 (MISCELLANEOUS) ×2 IMPLANT
ELECT CAUTERY BLADE 6.4 (BLADE) ×2 IMPLANT
ELECT CAUTERY BLADE TIP 2.5 (TIP) ×2
ELECT REM PT RETURN 9FT ADLT (ELECTROSURGICAL) ×2
ELECTRODE CAUTERY BLDE TIP 2.5 (TIP) ×2 IMPLANT
ELECTRODE REM PT RTRN 9FT ADLT (ELECTROSURGICAL) ×2 IMPLANT
GAUZE 4X4 16PLY ~~LOC~~+RFID DBL (SPONGE) ×2 IMPLANT
GLOVE ORTHO TXT STRL SZ7.5 (GLOVE) ×4 IMPLANT
GOWN STRL REUS W/ TWL LRG LVL3 (GOWN DISPOSABLE) ×2 IMPLANT
GOWN STRL REUS W/ TWL XL LVL3 (GOWN DISPOSABLE) ×2 IMPLANT
IV NS 500ML BAXH (IV SOLUTION) ×2 IMPLANT
KIT MARKER MARGIN INK (KITS) IMPLANT
KIT PORT INFUSION SMART 8FR (Port) ×2 IMPLANT
KIT TURNOVER KIT A (KITS) ×2 IMPLANT
MANIFOLD NEPTUNE II (INSTRUMENTS) ×2 IMPLANT
NDL HYPO 22X1.5 SAFETY MO (MISCELLANEOUS) ×2 IMPLANT
NEEDLE HYPO 22X1.5 SAFETY MO (MISCELLANEOUS) ×2 IMPLANT
PACK BASIN MINOR ARMC (MISCELLANEOUS) ×2 IMPLANT
PACK PORT-A-CATH (MISCELLANEOUS) ×2 IMPLANT
SHEATH BREAST BIOPSY SKIN MKR (SHEATH) ×2 IMPLANT
SPIKE FLUID TRANSFER (MISCELLANEOUS) ×2 IMPLANT
SUT MNCRL 4-0 27XMFL (SUTURE) ×2
SUT MNCRL AB 4-0 PS2 18 (SUTURE) ×2 IMPLANT
SUT VIC AB 3-0 SH 27X BRD (SUTURE) ×2 IMPLANT
SUTURE MNCRL 4-0 27XMF (SUTURE) ×2 IMPLANT
SYR 10ML LL (SYRINGE) ×2 IMPLANT
SYR 20ML LL LF (SYRINGE) ×2 IMPLANT
SYR 3ML LL SCALE MARK (SYRINGE) ×2 IMPLANT
SYR 5ML LL (SYRINGE) ×2 IMPLANT
SYR BULB IRRIG 60ML STRL (SYRINGE) IMPLANT
TAG SUTURE CLAMP YLW 5PR (MISCELLANEOUS) ×2
TOWEL OR 17X26 4PK STRL BLUE (TOWEL DISPOSABLE) IMPLANT
TRAP FLUID SMOKE EVACUATOR (MISCELLANEOUS) ×2 IMPLANT
TRAP NEPTUNE SPECIMEN COLLECT (MISCELLANEOUS) ×2 IMPLANT
WATER STERILE IRR 1000ML POUR (IV SOLUTION) ×2 IMPLANT
WATER STERILE IRR 500ML POUR (IV SOLUTION) ×2 IMPLANT

## 2023-11-04 NOTE — Op Note (Signed)
Pre-operative Diagnosis: Breast Cancer, left, ER negative, HER2 positive.    Post-operative Diagnosis: Same  Surgeon: Campbell Lerner, M.D., FACS  Anesthesia: General LMA  Procedure: Left central lumpectomy, Scout radar reflector tag directed, sentinel node biopsy  Procedure Details  The patient was seen again in the Holding Room. The benefits, complications, treatment options, and expected outcomes were discussed with the patient. The risks of bleeding, infection, recurrence of symptoms, failure to resolve symptoms, hematoma, seroma, open wound, cosmetic deformity, and the need for further surgery were discussed.  The patient was taken to Operating Room, identified as Rebecca Macias and the procedure verified.  A Time Out was held and the above information confirmed.  Prior to the induction of general anesthesia, antibiotic prophylaxis was administered. VTE prophylaxis was in place. The patient was positioned in the supine position. Appropriate anesthesia was then administered and tolerated well. The [employer] probe is used to mark the skin for incision.    A visual dye Isosulfan Blue 4 ml was injected periareolar dermis early under aseptic conditions.  Massage was administered to this area for 5 minutes prior to securely taping it.  The chest was prepped with Chloraprep and draped in the sterile fashion.  Then using the hand-held probe an area of high counts was identified in the axilla, an incision was made and blue lymphatic was identified and dissected to the 2 sentinel node clusters, direction by the probe aided in dissection of a lymph node which was sent for permanent section.  The second cluster having a blue lymph node also had significant counts to over 2000.  Background counts were clearly less than 200.  The first lymph node encountered was also blue but had lower counts, it was labeled sentinel lymph node 1.  There were no other blue lymphatics identified these to where the end  sentinel nodes from Lymphazurin blue, the highest count was over 2000 and the background count once that node was removed was clearly less than 200.  Attention was turned to the [facility] tag localization site where an inferior circumareolar incision was made. Dissection using the Scout probe to guide the lumpectomy with adequate margins was performed. This was done with electrocautery and sharp dissection with Mayo scissors. There was minimal bleeding, and the cavity packed.  The specimen was taken to the back table and painted to demarcate the 6 surfaces of potential margin.   I returned to the cavity to remove the packing, and hemostasis was confirmed with electrocautery.   Once assuring that hemostasis was adequate and checked multiple times the wound was closed with interrupted 3-0 Vicryl followed by 4-0 subcuticular Monocryl sutures.  The axillary wound was closed in a similar fashion. Dermabond is utilized to seal the incision.  Local infiltration of 0.25% Marcaine  with epi used as a depot to the biopsy cavity.    Findings: Faxitron imaging: Revealed both the initial biopsy clip and the Systems analyst.  Although magnified I feel we had adequate margins.  Estimated Blood Loss: Minimal         Drains: None         Specimens: 2 sentinel lymph node clusters, both having at least 1 lymph node stained blue and sentinel node cluster #2 had the highest count.       Complications: None         Condition: Stable  Sentinel Node Biopsy Synoptic Operative Report  Operation performed with curative intent:Yes  Tracer(s) used to identify sentinel nodes in  the upfront surgery (non-neoadjuvant) setting (select all that apply):Dye and Radioactive Tracer  Tracer(s) used to identify sentinel nodes in the neoadjuvant setting (select all that apply):N/A  All nodes (colored or non-colored) present at the end of a dye-filled lymphatic channel were removed:Yes   All significantly radioactive nodes  were removed:Yes  All palpable suspicious nodes were removed:Yes  Biopsy-proven positive nodes marked with clips prior to chemotherapy were identified and removed:N/A    Campbell Lerner, M.D., [facility]  11/04/2023 ; 1:29 PM

## 2023-11-04 NOTE — Interval H&P Note (Signed)
History and Physical Interval Note:  11/04/2023 2:26 PM  Rebecca Macias  has presented today for surgery, with the diagnosis of left breast.  The various methods of treatment have been discussed with the patient and family. After consideration of risks, benefits and other options for treatment, the patient has consented to  Procedure(s): BREAST LUMPECTOMY WITH RADIOFREQUENCY TAG IDENTIFICATION (Left) AXILLARY SENTINEL NODE BIOPSY (Left) INSERTION PORT-A-CATH (Right) as a surgical intervention.  The patient's history has been reviewed, patient examined, no change in status, stable for surgery.  I have reviewed the patient's chart and labs.  Questions were answered to the patient's satisfaction.   The left breast was marked in preop.  Campbell Lerner

## 2023-11-04 NOTE — Transfer of Care (Signed)
Immediate Anesthesia Transfer of Care Note  Patient: Rebecca Macias  Procedure(s) Performed: BREAST LUMPECTOMY WITH RADIOFREQUENCY TAG IDENTIFICATION (Left) AXILLARY SENTINEL NODE BIOPSY (Left) INSERTION PORT-A-CATH (Right: Chest)  Patient Location: PACU  Anesthesia Type:General  Level of Consciousness: awake, alert , oriented, and patient cooperative  Airway & Oxygen Therapy: Patient Spontanous Breathing and Patient connected to face mask oxygen  Post-op Assessment: Report given to RN, Post -op Vital signs reviewed and stable, Patient moving all extremities X 4, and Patient able to stick tongue midline  Post vital signs: Reviewed and stable  Last Vitals:  Vitals Value Taken Time  BP 129/62 11/04/23 1335  Temp    Pulse 87 11/04/23 1338  Resp 19 11/04/23 1338  SpO2 100 % 11/04/23 1338  Vitals shown include unfiled device data.  Last Pain:  Vitals:   11/04/23 0837  TempSrc: Temporal  PainSc: 0-No pain      Patients Stated Pain Goal: 0 (11/04/23 0837)  Complications: No notable events documented.

## 2023-11-04 NOTE — Progress Notes (Signed)
Dr. Claudine Mouton read chest film for port placement. Per Dr. Claudine Mouton in  correct place, ok for discharge home.

## 2023-11-04 NOTE — Anesthesia Preprocedure Evaluation (Addendum)
Anesthesia Evaluation  Patient identified by MRN, date of birth, ID band Patient awake    Reviewed: Allergy & Precautions, NPO status , Patient's Chart, lab work & pertinent test results  History of Anesthesia Complications Negative for: history of anesthetic complications  Airway Mallampati: I   Neck ROM: Full    Dental  (+) Edentulous Upper, Edentulous Lower   Pulmonary former smoker (quit 2012)   Pulmonary exam normal breath sounds clear to auscultation       Cardiovascular Exercise Tolerance: Good Normal cardiovascular exam Rhythm:Regular Rate:Normal  ECG 11/02/23: normal   Neuro/Psych negative neurological ROS     GI/Hepatic ,GERD  ,,  Endo/Other  diabetes, Type 2Hypothyroidism  Obesity   Renal/GU Renal disease (nephrolithiasis)     Musculoskeletal   Abdominal   Peds  Hematology  (+) Blood dyscrasia, anemia   Anesthesia Other Findings   Reproductive/Obstetrics                             Anesthesia Physical Anesthesia Plan  ASA: 2  Anesthesia Plan: General   Post-op Pain Management:    Induction: Intravenous  PONV Risk Score and Plan: 3 and Ondansetron, Dexamethasone and Treatment may vary due to age or medical condition  Airway Management Planned: LMA  Additional Equipment:   Intra-op Plan:   Post-operative Plan: Extubation in OR  Informed Consent: I have reviewed the patients History and Physical, chart, labs and discussed the procedure including the risks, benefits and alternatives for the proposed anesthesia with the patient or authorized representative who has indicated his/her understanding and acceptance.     Dental advisory given  Plan Discussed with: CRNA  Anesthesia Plan Comments: (Patient consented for risks of anesthesia including but not limited to:  - adverse reactions to medications - damage to eyes, teeth, lips or other oral mucosa - nerve  damage due to positioning  - sore throat or hoarseness - damage to heart, brain, nerves, lungs, other parts of body or loss of life  Informed patient about role of CRNA in peri- and intra-operative care.  Patient voiced understanding.)        Anesthesia Quick Evaluation

## 2023-11-04 NOTE — Anesthesia Postprocedure Evaluation (Signed)
Anesthesia Post Note  Patient: Rebecca Macias  Procedure(s) Performed: BREAST LUMPECTOMY WITH RADIOFREQUENCY TAG IDENTIFICATION (Left) AXILLARY SENTINEL NODE BIOPSY (Left) INSERTION PORT-A-CATH (Right: Chest)  Patient location during evaluation: PACU Anesthesia Type: General Level of consciousness: awake Pain management: pain level controlled Vital Signs Assessment: post-procedure vital signs reviewed and stable Respiratory status: spontaneous breathing Cardiovascular status: stable Anesthetic complications: no   No notable events documented.   Last Vitals:  Vitals:   11/04/23 1415 11/04/23 1433  BP: 109/60 138/73  Pulse: 84 84  Resp: 16 18  Temp:  36.7 C  SpO2: 94% 95%    Last Pain:  Vitals:   11/04/23 1433  TempSrc: Temporal  PainSc: 3                  VAN STAVEREN,[name]

## 2023-11-04 NOTE — Anesthesia Procedure Notes (Signed)
Procedure Name: LMA Insertion Date/Time: 11/04/2023 10:46 AM  Performed by: Rich Brave, CRNAPre-anesthesia Checklist: Patient identified, Emergency Drugs available, Suction available, Patient being monitored and Timeout performed Patient Re-evaluated:Patient Re-evaluated prior to induction Oxygen Delivery Method: Circle system utilized Preoxygenation: Pre-oxygenation with 100% oxygen Induction Type: IV induction Ventilation: Mask ventilation without difficulty LMA: LMA inserted LMA Size: 3.0 Tube type: Oral Number of attempts: 1 Airway Equipment and Method: Patient positioned with wedge pillow Tube secured with: Tape Dental Injury: Teeth and Oropharynx as per pre-operative assessment

## 2023-11-04 NOTE — Op Note (Signed)
SURGICAL PROCEDURE REPORT  DATE OF PROCEDURE: 11/04/2023   ATTENDING SURGEON:  Campbell Lerner, M.D., FACS   ANESTHESIA: General LMA  PRE-OPERATIVE DIAGNOSIS: HER2 positive left breast cancer benefiting by durable central venous access for chemotherapy  POST-OPERATIVE DIAGNOSIS: Same.  PROCEDURE: (cpt: 36561, 77001)  Insertion of right subclavian vein angiodynamics SmartPort central venous catheter with subcutaneous port under fluoroscopic guidance   INTRAOPERATIVE FINDINGS: Fluoroscopic confirmation of guidewire in superior vena cava, tip at fluoroscopy appears to be at caval atrial junction, excellent return and easy flush, well-secured tunneled central venous catheter with subcutaneous port at completion of the procedure.  FLUOROSCOPY: as recorded.   ESTIMATED BLOOD LOSS: Minimal (<20 mL)   SPECIMENS: None   IMPLANTS: 12F tunneled  angiodynamics SmartPort central venous catheter with subcutaneous port  DRAINS: None   COMPLICATIONS: None apparent, personal review of postoperative portable chest x-ray I see no evidence of right sided pneumothorax, the tip of the catheter appears to be at the right atrial junction.  CONDITION AT COMPLETION: Hemodynamically stable, awake   DISPOSITION: Proceed with breast surgery.  INDICATION(S) FOR PROCEDURE:  Patient is a 62 y.o. female who presented with HER2 positive left breast cancer requiring durable central venous access for chemotherapy. All risks, benefits, and alternatives to above elective procedures were discussed with the patient, who elected to proceed, and informed consent was accordingly obtained at that time.  DETAILS OF PROCEDURE:  Patient was brought to the operative suite and appropriately identified.  Right subclavian access site and planned port placement site were prepped and draped in the usual sterile fashion. Following a brief timeout, in Trendelenburg position, percutaneous right subclavian venous access was obtained  using Seldinger technique, by which local anesthetic was injected over the  subclavian region, and access needle was inserted into the SCV vein, through which soft guidewire was advanced, over which access needle was withdrawn. Length of catheter needed to position the catheter tip at the atrio-caval junction was then measured under direct fluoroscopic visualization, after which the catheter was cut to the measured length. Guidewire was secured, attention was directed to injection of local anesthetic along the planned port site, 2-3 cm transverse ipsilateral chest incision was made and confirmed to accommodate the subcutaneous port, and flushed measured catheter was attached to port, then the port was placed within the subcutaneous pocket. Insertion sheath was advanced over the guidewire, which was withdrawn along with the insertion sheath dilator.  The catheter was then advanced through the sheath into the SCV and SVC.  Port was confirmed to withdraw blood and flush easily, after which concentrated heparin was instilled into the port and catheter. Dermis at the subcutaneous pocket was re-approximated using buried interrupted 3-0 Vicryl suture, and 4-0 Monocryl suture was used to re-approximate skin at the insertion/subcutaneous port site in running subcuticular fashion for the subcutaneous port and buried interrupted fashion for the insertion site.  Using the straight Huebner needle the port was flushed with heparin at 1000 units/mL 4 to 5 mL was injected through the port.  Skin was cleaned, dried, and sterile skin glue was applied. Patient was then safely transferred to PACU for a chest x-ray.  I was present for all aspects of the procedures, and no intraprocedural complications were apparent.   Campbell Lerner, M.D., [facility] 11/04/2023 1:29 PM

## 2023-11-06 ENCOUNTER — Other Ambulatory Visit: Payer: Self-pay | Admitting: Pathology

## 2023-11-06 LAB — SURGICAL PATHOLOGY

## 2023-11-07 ENCOUNTER — Encounter: Payer: Self-pay | Admitting: Surgery

## 2023-11-11 ENCOUNTER — Inpatient Hospital Stay: Payer: Commercial Managed Care - PPO | Attending: Oncology | Admitting: Hospice and Palliative Medicine

## 2023-11-11 DIAGNOSIS — Z5112 Encounter for antineoplastic immunotherapy: Secondary | ICD-10-CM | POA: Insufficient documentation

## 2023-11-11 DIAGNOSIS — C50312 Malignant neoplasm of lower-inner quadrant of left female breast: Secondary | ICD-10-CM | POA: Insufficient documentation

## 2023-11-11 DIAGNOSIS — Z79899 Other long term (current) drug therapy: Secondary | ICD-10-CM | POA: Insufficient documentation

## 2023-11-11 DIAGNOSIS — Z5111 Encounter for antineoplastic chemotherapy: Secondary | ICD-10-CM | POA: Insufficient documentation

## 2023-11-11 DIAGNOSIS — Z171 Estrogen receptor negative status [ER-]: Secondary | ICD-10-CM | POA: Insufficient documentation

## 2023-11-11 NOTE — Progress Notes (Signed)
Multidisciplinary Oncology Council Documentation  CEYLIN STIEGLITZ was presented by our [facility] on 11/11/2023, which included representatives from:  Palliative Care Dietitian  Physical/Occupational Therapist Nurse Navigator Genetics Social work Survivorship RN Financial Navigator Research RN   Senta currently presents with history of breast cancer  We reviewed previous medical and familial history, history of present illness, and recent lab results along with all available histopathologic and imaging studies. The MOC considered available treatment options and made the following recommendations/referrals:  Nutrition, genetics, SW, rehab screening  The MOC is a meeting of clinicians from various specialty areas who evaluate and discuss patients for whom a multidisciplinary approach is being considered. Final determinations in the plan of care are those of the provider(s).   Today's extended care, comprehensive team conference, Kandise was not present for the discussion and was not examined.

## 2023-11-12 ENCOUNTER — Inpatient Hospital Stay: Payer: Commercial Managed Care - PPO

## 2023-11-12 NOTE — Progress Notes (Signed)
[facility] Clinical Social Work  Initial Assessment   Rebecca Macias is a 62 y.o. year old female contacted by phone. Clinical Social Work was referred by  [facility]  for assessment of psychosocial needs.   SDOH (Social Determinants of Health) assessments performed: Yes   SDOH Screenings   Food Insecurity: No Food Insecurity (10/28/2023)  Housing: Low Risk  (10/28/2023)  Transportation Needs: No Transportation Needs (10/28/2023)  Utilities: Not At Risk (10/28/2023)  Depression (PHQ2-9): Low Risk  (10/28/2023)  Financial Resource Strain: Low Risk  (06/17/2023)  Physical Activity: Sufficiently Active (06/17/2023)  Social Connections: Moderately Integrated (06/17/2023)  Stress: Stress Concern Present (06/17/2023)  Tobacco Use: Medium Risk (11/04/2023)     Distress Screen completed: No    10/28/2023    2:18 PM  ONCBCN DISTRESS SCREENING  Screening Type Initial Screening  Distress experienced in past week (1-10) 0      Family/Social Information:  Housing Arrangement: patient lives alone but is currently staying with her daughter while she recovers from her surgery. Family members/support persons in your life? Family.  She has two daughters and a son who are supportive. Transportation concerns: no  Employment: Out on work excuse. She works full-time at [employer].  Her disability has not yet been approved. Income source: Employment Financial concerns: No Type of concern: None Food access concerns: no Religious or spiritual practice: Yes-Patient identifies as Control and instrumentation engineer. Services Currently in place:  [employer]  Coping/ Adjustment to diagnosis: Patient understands treatment plan and what happens next? yes Concerns about diagnosis and/or treatment: Pain or discomfort during procedures.  Patient is concerned about her response to chemo in the future. Patient reported stressors: Adjusting to my illness Hopes and/or priorities: Family  Patient enjoys time with family/ friends Current coping  skills/ strengths: Active sense of humor , Average or above average intelligence , Communication skills , Financial means , General fund of knowledge , Motivation for treatment/growth , and Supportive family/friends     SUMMARY: Current SDOH Barriers:  None per patient.  Clinical Social Work Clinical Goal(s):  Patient will work with SW to address concerns related to adjusting to her illness.  Interventions: Discussed common feeling and emotions when being diagnosed with cancer, and the importance of support during treatment Informed patient of the support team roles and support services at [facility] Provided CSW contact information and encouraged patient to call with any questions or concerns Provided patient with information about the [employer].  CSW to mail her program literature and contact information.   Follow Up Plan: CSW will follow-up with patient by phone  Patient verbalizes understanding of plan: Yes    Dorothey Baseman, LCSW Clinical Social Worker [facility]

## 2023-11-16 ENCOUNTER — Other Ambulatory Visit: Payer: Commercial Managed Care - PPO

## 2023-11-16 ENCOUNTER — Other Ambulatory Visit: Payer: Self-pay | Admitting: Oncology

## 2023-11-16 NOTE — Progress Notes (Signed)
Tumor Board Documentation  Rebecca Macias was presented by Dr. Smith Robert at our Tumor Board on 11/16/2023, which included representatives from medical oncology, radiation oncology, surgical, radiology, pathology, navigation, genetics, palliative care.  Rebecca Macias currently presents   with history of the following treatments: surgical intervention(s).  Additionally, we reviewed previous medical and familial history, history of present illness, and recent lab results along with all available histopathologic and imaging studies. The tumor board considered available treatment options and made the following recommendations: Adjuvant chemotherapy, Adjuvant radiation    The following procedures/referrals were also placed: No orders of the defined types were placed in this encounter.   Clinical Trial Status: not discussed   Staging used:AJCC Staging: T: T1b N: N0 M: M0     National site-specific guidelines NCCN were discussed with respect to the case.  Tumor board is a meeting of clinicians from various specialty areas who evaluate and discuss patients for whom a multidisciplinary approach is being considered. Final determinations in the plan of care are those of the provider(s). The responsibility for follow up of recommendations given during tumor board is that of the provider.   Today's extended care, comprehensive team conference, Rebecca Macias was not present for the discussion and was not examined.   Multidisciplinary Tumor Board is a multidisciplinary case peer review process.  Decisions discussed in the Multidisciplinary Tumor Board reflect the opinions of the specialists present at the conference without having examined the patient.  Ultimately, treatment and diagnostic decisions rest with the primary provider(s) and the patient.

## 2023-11-18 ENCOUNTER — Inpatient Hospital Stay: Payer: Commercial Managed Care - PPO | Admitting: Occupational Therapy

## 2023-11-18 ENCOUNTER — Telehealth: Payer: Self-pay | Admitting: Licensed Clinical Social Worker

## 2023-11-18 ENCOUNTER — Encounter: Payer: Self-pay | Admitting: Licensed Clinical Social Worker

## 2023-11-18 ENCOUNTER — Encounter: Payer: Self-pay | Admitting: Occupational Therapy

## 2023-11-18 ENCOUNTER — Encounter: Payer: Self-pay | Admitting: *Deleted

## 2023-11-18 ENCOUNTER — Ambulatory Visit: Payer: Self-pay | Admitting: Licensed Clinical Social Worker

## 2023-11-18 DIAGNOSIS — Z1379 Encounter for other screening for genetic and chromosomal anomalies: Secondary | ICD-10-CM | POA: Insufficient documentation

## 2023-11-18 DIAGNOSIS — Z5111 Encounter for antineoplastic chemotherapy: Secondary | ICD-10-CM | POA: Diagnosis present

## 2023-11-18 DIAGNOSIS — Z9189 Other specified personal risk factors, not elsewhere classified: Secondary | ICD-10-CM

## 2023-11-18 DIAGNOSIS — Z5112 Encounter for antineoplastic immunotherapy: Secondary | ICD-10-CM | POA: Diagnosis present

## 2023-11-18 DIAGNOSIS — M25612 Stiffness of left shoulder, not elsewhere classified: Secondary | ICD-10-CM

## 2023-11-18 DIAGNOSIS — M2560 Stiffness of unspecified joint, not elsewhere classified: Secondary | ICD-10-CM

## 2023-11-18 DIAGNOSIS — Z171 Estrogen receptor negative status [ER-]: Secondary | ICD-10-CM | POA: Diagnosis not present

## 2023-11-18 DIAGNOSIS — C50312 Malignant neoplasm of lower-inner quadrant of left female breast: Secondary | ICD-10-CM | POA: Diagnosis present

## 2023-11-18 DIAGNOSIS — Z79899 Other long term (current) drug therapy: Secondary | ICD-10-CM | POA: Diagnosis not present

## 2023-11-18 NOTE — Progress Notes (Unsigned)
[facility] POST-OP OFFICE VISIT  11/19/2023  HPI: Rebecca Macias is a 62 y.o. female 15 days s/p Scout tag localized left breast lumpectomy and left sentinel lymph node biopsy and Port-A-Cath placement.  Reviewed the pathology report noted below.  She reports having some soreness, and the residual palpable knot in the axilla.  She reports that she had multiple days, up to a week of no pain postoperatively.  And has developed a degree more tenderness of late with increased activity and range of motion of the left arm.  1. Lymph node, sentinel, biopsy, #1-#2 left axilla :      - THREE LYMPH NODES NEGATIVE FOR MALIGNANCY (0/3).       2. 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      - DUCTAL CARCINOMA IN SITU (DCIS).      - SEE CANCER SUMMARY BELOW.      - PRIOR BIOPSY SITE CHANGE WITH CLIP.      - SCOUT TAG PRESENT.  ELECTRONIC SIGNATURE : Rubinas Md, Delice Bison , Sports administrator, International aid/development worker  MICROSCOPIC DESCRIPTION 1. CANCER CASE SUMMARY: INVASIVE CARCINOMA OF THE BREAST Standard(s): AJCC-UICC 8  SPECIMEN Procedure: Lumpectomy Specimen Laterality: Left  TUMOR Histologic Type: Invasive carcinoma of no special type (ductal) Histologic Grade (Nottingham Histologic Score) Glandular (Acinar)/Tubular Differentiation: 3 Nuclear Pleomorphism: 3 Mitotic Rate: 2 Overall Grade: 3 Tumor Size: Greatest dimension of largest invasive focus: 10 mm Ductal Carcinoma In Situ (DCIS): Present, high-grade Tumor Extent: Not applicable Lymphatic and/or Vascular Invasion: Not identified Treatment Effect in the Breast: No known presurgical therapy  MARGINS Margin Status for Invasive Carcinoma: All margins negative for invasive carcinoma Distance from closest margin: 0.4 mm Specify closest margin: Inferior Margin Status for DCIS: All margins negative for DCIS Distance from DCIS to closest margin: 0.1 mm Specify closest  margin: Inferior                      REGIONAL LYMPH NODES Regional Lymph Node Status: All regional lymph nodes negative for tumor Total Number of Lymph Nodes Examined (sentinel and non-sentinel): 3 Number of Sentinel Nodes Examined: 3   Vital signs: BP (!) 148/79   Pulse 97   Temp 98.6 F (37 C) (Oral)   Ht 5\' 5"  (1.651 m)   Wt 209 lb 12.8 oz (95.2 kg)   LMP 11/03/2012   SpO2 98%   BMI 34.91 kg/m    Physical Exam: Constitutional: She appears well.  No distress.  Skin: Circumareolar incision, clean dry and intact.  Evidence of resolving dermal ecchymosis lateral left breast.  Left axillary incision is clean dry and intact.  In the depths of the axilla there is a well-defined small knot of seroma between 2 and 3 cm.  No evidence of induration, no evidence of cellulitis or hematoma. Right subclavian port site, clean dry and intact as well.  CLINICAL DATA:  [id number] Port-A-Cath in place [id number]   EXAM: PORTABLE CHEST 1 VIEW   COMPARISON:  None Available.   FINDINGS: Right chest wall port-a-cath with tip overlying the superior caovatrial junction.   The heart and mediastinal contours are within normal limits.   Question retrocardiac airspace opacity. No pulmonary edema. No pleural effusion. No pneumothorax.   No acute osseous abnormality.   IMPRESSION: Question retrocardiac airspace opacity. Finding could represent atelectasis.     Electronically Signed   By: Tish Frederickson M.D.   On:  11/04/2023 18:33  Assessment/Plan: This is a 62 y.o. female 15 days s/p right subclavian Port-A-Cath placement, Scout tagged left breast lumpectomy and left axillary sentinel lymph node biopsy.  Progressing well as expected.  Patient Active Problem List   Diagnosis Date Noted   Genetic testing 11/18/2023   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 in female, estrogen receptor negative (HCC) 10/28/2023   Goals of care, counseling/discussion 10/28/2023   Allergic  reaction 06/18/2023   Postoperative adhesions 12/29/2022   Hyperplastic rectal polyp    Chronic low back pain with left-sided sciatica 10/10/2021   Non-insulin dependent type 2 diabetes mellitus (HCC) 07/10/2016   GERD (gastroesophageal reflux disease) 07/02/2016   Subclinical hyperthyroidism 06/12/2016   Palpitations 05/08/2015   Insomnia 05/08/2015   Symptoms, such as flushing, sleeplessness, headache, lack of concentration, associated with the menopause 05/08/2015   Thyroid nodule, cold 02/12/2015   Routine general medical examination at a health care facility 04/20/2014   Obesity (BMI 35.0-39.9 without comorbidity) 09/01/2012   Ovarian cyst     -She will follow-up with oncology and radiation oncology for planned treatment.  Subsequent need for follow-up left breast mammogram in 6 months with clinical exam at that time.   Campbell Lerner M.D., FACS 11/19/2023, 10:06 AM

## 2023-11-18 NOTE — Therapy (Signed)
OUTPATIENT OCCUPATIONAL THERAPY BREAST CANCER POST OP FOLLOW UP   Patient Name: Rebecca Macias MRN: [id number] DOB:Jun 05, 1961, 62 y.o., female Today's Date: 11/18/2023  END OF SESSION:  OT End of Session - 11/18/23 2018     Visit Number 1    Number of Visits 4    Date for OT Re-Evaluation 01/13/24    OT Start Time 0901    OT Stop Time 0931    OT Time Calculation (min) 30 min    Activity Tolerance Patient tolerated treatment well    Behavior During Therapy [facility] for tasks assessed/performed             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, estrogen receptor negative (HCC)    Obesity    Ovarian cyst    Palpitations    Sigmoid diverticulitis 08/2012   Subclinical hypothyroidism    SVT (supraventricular tachycardia) (HCC)    on Holter monitor 5 beat run of svt   Thyroid nodule    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   Type 2 diabetes mellitus (HCC)    Past Surgical History:  Procedure Laterality Date   AXILLARY SENTINEL NODE BIOPSY Left 11/04/2023   Procedure: AXILLARY SENTINEL NODE BIOPSY;  Surgeon: Campbell Lerner, MD;  Location: [facility] ORS;  Service: General;  Laterality: Left;   BLADDER SUSPENSION  2009   BREAST BIOPSY Left 10/22/2023   [place] Core Bx, heart clip - path pending   BREAST BIOPSY Left 10/22/2023   [place] LT BREAST BX W LOC DEV 1ST LESION IMG BX SPEC US GUIDE 10/22/2023 [facility]   BREAST LUMPECTOMY WITH RADIOFREQUENCY TAG IDENTIFICATION Left 11/04/2023   Procedure: BREAST LUMPECTOMY WITH RADIOFREQUENCY TAG IDENTIFICATION;  Surgeon: Campbell Lerner, MD;  Location: [facility];  Service: General;  Laterality: Left;   Colectomy Left    likely sigmoid, open.  for  diverticulitis.   COLONOSCOPY WITH PROPOFOL N/A 10/01/2022   Procedure: COLONOSCOPY WITH PROPOFOL;  Surgeon: Toney Reil, MD;  Location: [facility] ENDOSCOPY;  Service: Gastroenterology;  Laterality: N/A;   PORTACATH PLACEMENT Right 11/04/2023   Procedure: INSERTION PORT-A-CATH;  Surgeon: Campbell Lerner, MD;  Location: [facility] ORS;  Service: General;  Laterality: Right;   TUBAL LIGATION     XI ROBOTIC LAPAROSCOPIC ASSISTED APPENDECTOMY N/A 12/29/2022   Procedure: XI ROBOTIC LYSIS OF ADHESIONS AND APPENDECTOMY;  Surgeon: Campbell Lerner, MD;  Location: [facility] OR[facility];  Service: General;  Laterality: N/A;   Patient Active Problem List   Diagnosis Date Noted   Genetic testing 11/18/2023   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 in female, estrogen receptor negative (HCC) 10/28/2023   Goals of care, counseling/discussion 10/28/2023   Allergic reaction 06/18/2023   Postoperative adhesions 12/29/2022   Hyperplastic rectal polyp    Chronic low back pain with left-sided sciatica 10/10/2021   Non-insulin dependent type 2 diabetes mellitus (HCC) 07/10/2016   GERD (gastroesophageal reflux disease) 07/02/2016   Subclinical hyperthyroidism 06/12/2016   Palpitations 05/08/2015   Insomnia 05/08/2015   Symptoms, such as flushing, sleeplessness, headache, lack of concentration, associated with the menopause 05/08/2015   Thyroid nodule, cold 02/12/2015   Routine general medical examination at a health care facility  04/20/2014   Obesity (BMI 35.0-39.9 without comorbidity) 09/01/2012   Ovarian cyst     PCP: Chestine Spore NP  REFERRING PROVIDER: Dr Glade Stanford DIAG: L lumpectomy   THERAPY DIAG:  Stiffness of left shoulder, not elsewhere classified  Rationale for Evaluation and Treatment: Rehabilitation  ONSET DATE: 11/04/23  SUBJECTIVE:                                                                                                                                                                                            SUBJECTIVE STATEMENT: I am doing okay.  Some pain my left breast.  And this pocket of swelling.  I am seeing Dr. Claudine Mouton.  I do want you to go over exercises.  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  Patient had left lumpectomy by Dr. Claudine Mouton on 11/04/2023.  Patient referred by Dr. Smith Robert for OT to evaluate and treat postlumpectomy for education for home exercises as well as education on signs and symptoms as well as prevention of lymphedema.  PATIENT GOALS:  Reassess how my recovery is going related to arm function, pain, and swelling.  PAIN:  Are you having pain? 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  Does involve unpacking boxes larger ones once a week and smaller ones.  Has low grandkids to play with them pick up like some gardening and do her own housework   OBJECTIVE:    OBSERVATIONS: Patient arrive with some guarding of left upper extremity.  As well as show some stiffness in left shoulder.  POSTURE:  Rounded shoulders as well as guarding left breast and arm.  [facility] OT Assessment - 11/18/23 0001       AROM   Left Shoulder Flexion 145 Degrees    Left Shoulder ABduction 135 Degrees              LYMPHEDEMA ASSESSMENT:    LYMPHEDEMA/ONCOLOGY QUESTIONNAIRE - 11/18/23 0001       Right Upper Extremity Lymphedema   15 cm Proximal to Olecranon Process 37.8 cm    10 cm Proximal to Olecranon Process 37 cm    Olecranon Process 29.5 cm    15 cm Proximal to Ulnar Styloid Process 28.5 cm    Just Proximal to Ulnar Styloid Process 17.8 cm      Left Upper Extremity Lymphedema   15 cm Proximal to Olecranon Process 40  cm    10 cm Proximal to Olecranon Process 38.2 cm    Olecranon Process 30 cm    15 cm Proximal to Ulnar  Styloid Process 28.5 cm    Just Proximal to Ulnar Styloid Process 19 cm               L-DEX FLOWSHEETS - 11/18/23 2000       L-DEX LYMPHEDEMA SCREENING   Measurement Type Unilateral    L-DEX MEASUREMENT EXTREMITY Upper Extremity    POSITION  Standing    DOMINANT SIDE Right    At Risk Side Left    BASELINE SCORE (UNILATERAL) 2.2              Surgery type/Date: Left lumpectomy 11/04/2023 Number of lymph nodes removed: 4 Current/past treatment (chemo, radiation, hormone therapy): Chemo and radiation Other symptoms:  Heaviness/tightness No Pain Yes Pitting edema No Infections No Decreased scar mobility Yes Stemmer sign No    ASSESSMENT:  CLINICAL IMPRESSION: Patient presented about 2 weeks post left lumpectomy by Dr. Claudine Mouton.  Surgery 11/04/2023.  Patient arrived with increased stiffness in the left upper extremity shoulder in all range as well as increase discomfort and pain.  Patient report a small pocket of swelling under the left armpit.  Patient will discuss with surgeon and next visit.  Patient has questions about worries about returning back to work being physical and working 12-hour days.  Patient also has 2 grandkids.  Reviewed with patient to discuss with oncologist about her work schedule and return to work.  Also reviewed with patient precautions on lifting grandkids.  Patient to focus on increasing motion in the future will address strength.  Reviewed with patient active assisted range of motion in supine for shoulder flexion as well as abduction and external rotation keeping pain under 2/10.  Will upgrade his indicated Pt will benefit from skilled therapeutic intervention to improve on the following deficits: Decreased knowledge of precautions, impaired UE functional use, pain, decreased ROM, postural dysfunction.   OT treatment/interventions: ADL/Self care home management, 97110-Therapeutic exercises, 97530- Therapeutic activity, [id number]- Neuromuscular  re-education, [phone]- Self Care, 65784- Manual therapy, Patient/Family education, and Scar mobilization   GOALS: Goals reviewed with patient? Yes  LONG TERM GOALS:  (STG=LTG)  GOALS Name Target Date  Goal status  1 Pt will demonstrate she has regained full shoulder ROM and function post operatively compared to baselines.  Baseline: Patient 2 weeks postop shoulder flexion 145 and abduction 135 with discomfort 8 wks INITIAL  2 Patient will verbalize understanding of signs and symptoms and preventing lymphedema.  Baseline-initiated education this date and provided patient with a handout. 8 wks INITIAL               PLAN:  OT FREQUENCY/DURATION: 1 x wk to biweekly   PLAN FOR NEXT SESSION: 8 wks    Scar massage You can begin gentle scar massage to you incision sites. Gently place one hand on the incision and move the skin (without sliding on the skin) in various directions. Do this for a few minutes and then you can gently massage either coconut oil or vitamin E cream into the scars.  Compression garment You should continue wearing your compression bra until you feel like you no longer have swelling.  Home exercise Program Continue doing the exercises you were given until you feel like you can do them without feeling any tightness at the end.   Walking Program Studies show that 30 minutes of walking per day (  fast enough to elevate your heart rate) can significantly reduce the risk of a cancer recurrence. If you can't walk due to other medical reasons, we encourage you to find another activity you could do (like a stationary bike or water exercise).  Posture After breast cancer surgery, people frequently sit with rounded shoulders posture because it puts their incisions on slack and feels better. If you sit like this and scar tissue forms in that position, you can become very tight and have pain sitting or standing with good posture. Try to be aware of your posture and sit and stand  up tall to heal properly.  Follow up OT: It is recommended you return every 3 months for the first 3 years following surgery to be assessed on the SOZO machine for an L-Dex score. This helps prevent clinically significant lymphedema in 95% of patients. These follow up screens are 10 minute appointments that you are not billed for.  Oletta Cohn, OTR/L,CLT 11/18/2023, 8:21 PM

## 2023-11-18 NOTE — Progress Notes (Signed)
HPI:   Ms. Geiger was previously seen in the [facility] due to a personal and family history of breast cancer and concerns regarding a hereditary predisposition to cancer. Please refer to our prior cancer genetics clinic note for more information regarding our discussion, assessment and recommendations, at the time. Ms. Wesch's recent genetic test results were disclosed to her, as were recommendations warranted by these results. These results and recommendations are discussed in more detail below.  CANCER HISTORY:  Oncology History  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 in female, estrogen receptor negative (HCC)  10/28/2023 Initial Diagnosis   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 in female, estrogen receptor negative (HCC)   10/28/2023 Cancer Staging   Staging form: Breast, AJCC 8th Edition - Clinical stage from 10/28/2023: Stage IA (cT1b, cN0, cM0, G2, ER-, PR-, HER2+) - Signed by Creig Hines, MD on 10/28/2023 Histologic grading system: 3 grade system   11/17/2023 Genetic Testing   Negative genetic testing. No pathogenic variants identified on the [facility] CancerNext-Expanded+RNA panel. The report date is 11/17/2023.  The CancerNext-Expanded gene panel offered by [facility] and includes sequencing, rearrangement, and RNA analysis for the following 76 genes: AIP, ALK, APC, ATM, AXIN2, BAP1, BARD1, BMPR1A, BRCA1, BRCA2, BRIP1, CDC73, CDH1, CDK4, CDKN1B, CDKN2A, CEBPA, CHEK2, CTNNA1, DDX41, DICER1, ETV6, FH, FLCN, GATA2, LZTR1, MAX, MBD4, MEN1, MET, MLH1, MSH2, MSH3, MSH6, MUTYH, NF1, NF2, NTHL1, PALB2, PHOX2B, PMS2, POT1, PRKAR1A, PTCH1, PTEN, RAD51C, RAD51D, RB1, RET, RUNX1, SDHA, SDHAF2, SDHB, SDHC, SDHD, SMAD4, SMARCA4, SMARCB1, SMARCE1, STK11, SUFU, TMEM127, TP53, TSC1, TSC2, VHL, and WT1 (sequencing and deletion/duplication); EGFR, HOXB13, KIT, MITF, PDGFRA, POLD1, and POLE (sequencing only); EPCAM and GREM1  (deletion/duplication only).      FAMILY HISTORY:  We obtained a detailed, 4-generation family history.  Significant diagnoses are listed below: Family History  Problem Relation Age of Onset   Cancer - Cervical Mother        dx 44s; Passed away from Kidney Issues (Not Cancer)   Heart disease Father    Kidney disease Father    Diabetes Father    Skin cancer Sister    Stomach cancer Paternal Uncle    Diabetes Maternal Grandmother    Skin cancer Maternal Grandfather    Diabetes Paternal Grandmother    Breast cancer Other        mat great aunts x2; unk age of dx   Lung cancer Cousin    Brain cancer Cousin     Ms. Pattison has 2 daughters and 3 sons. She has 3 full sisters, 2 paternal half sisters, 2 paternal half brothers. One of her full sisters has had skin cancer.   Ms. Hyson's mother had cervical cancer in her 91s and passed at 77. Maternal grandfather had skin cancer. Maternal grandmother had two sisters that had breast cancer, unknown ages of diagnosis.   Ms. Rupard's father passed at 40. A paternal uncle had stomach cancer. A paternal cousin had brain cancer. A paternal cousin had lung cancer.   Ms. Raspanti is unaware of previous family history of genetic testing for hereditary cancer risks. There is no reported Ashkenazi Jewish ancestry. There is no known consanguinity.     GENETIC TEST RESULTS:  The [facility] CancerNext-Expanded+RNA Panel found no pathogenic mutations.   The CancerNext-Expanded gene panel offered by [facility] and includes sequencing, rearrangement, and RNA analysis for the following 76 genes: AIP, ALK, APC, ATM, AXIN2, BAP1, BARD1, BMPR1A, BRCA1, BRCA2, BRIP1, CDC73,  CDH1, CDK4, CDKN1B, CDKN2A, CEBPA, CHEK2, CTNNA1, DDX41, DICER1, ETV6, FH, FLCN, GATA2, LZTR1, MAX, MBD4, MEN1, MET, MLH1, MSH2, MSH3, MSH6, MUTYH, NF1, NF2, NTHL1, PALB2, PHOX2B, PMS2, POT1, PRKAR1A, PTCH1, PTEN, RAD51C, RAD51D, RB1, RET, RUNX1, SDHA, SDHAF2, SDHB, SDHC, SDHD, SMAD4,  SMARCA4, SMARCB1, SMARCE1, STK11, SUFU, TMEM127, TP53, TSC1, TSC2, VHL, and WT1 (sequencing and deletion/duplication); EGFR, HOXB13, KIT, MITF, PDGFRA, POLD1, and POLE (sequencing only); EPCAM and GREM1 (deletion/duplication only).   The test report has been scanned into EPIC and is located under the Molecular Pathology section of the Results Review tab.  A portion of the result report is included below for reference. Genetic testing reported out on 11/17/2023.     Even though a pathogenic variant was not identified, possible explanations for the cancer in the family may include: There may be no hereditary risk for cancer in the family. The cancers in Ms. Caylor and/or her family may be sporadic/familial or due to other genetic and environmental factors. There may be a gene mutation in one of these genes that current testing methods cannot detect but that chance is small. There could be another gene that has not yet been discovered, or that we have not yet tested, that is responsible for the cancer diagnoses in the family.  It is also possible there is a hereditary cause for the cancer in the family that Ms. Pettie did not inherit. Therefore, it is important to remain in touch with cancer genetics in the future so that we can continue to offer Ms. Harnisch the most up to date genetic testing.   ADDITIONAL GENETIC TESTING:  We discussed with Ms. Flinchbaugh that her genetic testing was fairly extensive.  If there are additional relevant genes identified to increase cancer risk that can be analyzed in the future, we would be happy to discuss and coordinate this testing at that time.    CANCER SCREENING RECOMMENDATIONS:  Ms. Probert's test result is considered negative (normal).  This means that we have not identified a hereditary cause for her personal and family history of cancer at this time.   An individual's cancer risk and medical management are not determined by genetic test results alone. Overall cancer  risk assessment incorporates additional factors, including personal medical history, family history, and any available genetic information that may result in a personalized plan for cancer prevention and surveillance. Therefore, it is recommended she continue to follow the cancer management and screening guidelines provided by her oncology and primary healthcare provider.  RECOMMENDATIONS FOR FAMILY MEMBERS:   Since she did not inherit a identifiable mutation in a cancer predisposition gene included on this panel, her children could not have inherited a known mutation from her in one of these genes. Individuals in this family might be at some increased risk of developing cancer, over the general population risk, due to the family history of cancer.  Individuals in the family should notify their providers of the family history of cancer. We recommend women in this family have a yearly mammogram beginning at age 15, or 18 years younger than the earliest onset of cancer, an annual clinical breast exam, and perform monthly breast self-exams.  Family members should have colonoscopies by at age 59, or earlier, as recommended by their providers.  FOLLOW-UP:  Lastly, we discussed with Ms. Fackler that cancer genetics is a rapidly advancing field and it is possible that new genetic tests will be appropriate for her and/or her family members in the future. We encouraged her to remain  in contact with cancer genetics on an annual basis so we can update her personal and family histories and let her know of advances in cancer genetics that may benefit this family.   Our contact number was provided. Ms. Koch's questions were answered to her satisfaction, and she knows she is welcome to call us at anytime with additional questions or concerns.    Lacy Duverney, MS, [facility] Genetic Counselor [email or web address] Phone: [phone]

## 2023-11-18 NOTE — Telephone Encounter (Signed)
I contacted Ms. Stefano to discuss her genetic testing results. No pathogenic variants were identified in the 76 genes analyzed. Detailed clinic note to follow.   The test report has been scanned into EPIC and is located under the Molecular Pathology section of the Results Review tab.  A portion of the result report is included below for reference.      Lacy Duverney, MS, [facility] Genetic Counselor [email or web address] Phone: [phone]

## 2023-11-19 ENCOUNTER — Ambulatory Visit
Admission: RE | Admit: 2023-11-19 | Discharge: 2023-11-19 | Disposition: A | Discharge status: Home / Self Care | Payer: Commercial Managed Care - PPO | Source: Ambulatory Visit | Attending: Oncology | Admitting: Oncology

## 2023-11-19 ENCOUNTER — Ambulatory Visit: Payer: Commercial Managed Care - PPO | Admitting: Surgery

## 2023-11-19 ENCOUNTER — Encounter: Payer: Self-pay | Admitting: Surgery

## 2023-11-19 VITALS — BP 148/79 | HR 97 | Temp 98.6°F | Ht 65.0 in | Wt 209.8 lb

## 2023-11-19 DIAGNOSIS — C50312 Malignant neoplasm of lower-inner quadrant of left female breast: Secondary | ICD-10-CM

## 2023-11-19 DIAGNOSIS — I471 Supraventricular tachycardia, unspecified: Secondary | ICD-10-CM | POA: Insufficient documentation

## 2023-11-19 DIAGNOSIS — Z0189 Encounter for other specified special examinations: Secondary | ICD-10-CM

## 2023-11-19 DIAGNOSIS — E119 Type 2 diabetes mellitus without complications: Secondary | ICD-10-CM | POA: Diagnosis present

## 2023-11-19 DIAGNOSIS — Z171 Estrogen receptor negative status [ER-]: Secondary | ICD-10-CM

## 2023-11-19 DIAGNOSIS — I517 Cardiomegaly: Secondary | ICD-10-CM | POA: Diagnosis not present

## 2023-11-19 DIAGNOSIS — Z08 Encounter for follow-up examination after completed treatment for malignant neoplasm: Secondary | ICD-10-CM

## 2023-11-19 LAB — ECHOCARDIOGRAM COMPLETE
AR max vel: 2.1 cm2
AV Area VTI: 2.28 cm2
AV Area mean vel: 1.86 cm2
AV Mean grad: 5 mm[Hg]
AV Peak grad: 7.4 mm[Hg]
Ao pk vel: 1.36 m/s
Area-P 1/2: 4.36 cm2
Height: 65 in
MV VTI: 2.33 cm2
S' Lateral: 2.8 cm
Weight: 3356.8 [oz_av]

## 2023-11-19 NOTE — Patient Instructions (Signed)

## 2023-11-20 ENCOUNTER — Encounter: Payer: Self-pay | Admitting: Oncology

## 2023-11-20 ENCOUNTER — Inpatient Hospital Stay (HOSPITAL_BASED_OUTPATIENT_CLINIC_OR_DEPARTMENT_OTHER): Payer: Commercial Managed Care - PPO | Admitting: Oncology

## 2023-11-20 VITALS — BP 140/63 | HR 95 | Temp 99.2°F | Resp 18 | Wt 209.8 lb

## 2023-11-20 DIAGNOSIS — C50312 Malignant neoplasm of lower-inner quadrant of left female breast: Secondary | ICD-10-CM | POA: Diagnosis not present

## 2023-11-20 DIAGNOSIS — Z171 Estrogen receptor negative status [ER-]: Secondary | ICD-10-CM | POA: Diagnosis not present

## 2023-11-20 DIAGNOSIS — Z5112 Encounter for antineoplastic immunotherapy: Secondary | ICD-10-CM | POA: Diagnosis not present

## 2023-11-20 DIAGNOSIS — Z7189 Other specified counseling: Secondary | ICD-10-CM

## 2023-11-20 MED ORDER — ONDANSETRON HCL 8 MG PO TABS: 8.0000 mg | ORAL_TABLET | Freq: Three times a day (TID) | ORAL | 1 refills | Status: DC | PRN

## 2023-11-20 MED ORDER — PROCHLORPERAZINE MALEATE 10 MG PO TABS: 10.0000 mg | ORAL_TABLET | Freq: Four times a day (QID) | ORAL | 1 refills | Status: DC | PRN

## 2023-11-20 MED ORDER — LIDOCAINE-PRILOCAINE 2.5-2.5 % EX CREA: TOPICAL_CREAM | CUTANEOUS | 3 refills | Status: DC

## 2023-11-20 NOTE — Progress Notes (Signed)
START ON PATHWAY REGIMEN - Breast     Cycle 1: A cycle is 7 days:     Trastuzumab-xxxx      Paclitaxel    Cycles 2 through 12: A cycle is every 7 days:     Trastuzumab-xxxx      Paclitaxel    Cycles 13 through 25: A cycle is every 21 days:     Trastuzumab-xxxx   **Always confirm dose/schedule in your pharmacy ordering system**  Patient Characteristics: Postoperative without Neoadjuvant Therapy, M0 (Pathologic Staging), Invasive Disease, Adjuvant Therapy, HER2 Positive, ER Negative, Node Negative, pT1b, pN0/N61mi, Chemotherapy Indicated Therapeutic Status: Postoperative without Neoadjuvant Therapy, M0 (Pathologic Staging) AJCC Grade: G3 AJCC N Category: pN0 AJCC M Category: cM0 ER Status: Negative (-) AJCC 8 Stage Grouping: IA HER2 Status: Positive (+) Oncotype Dx Recurrence Score: Not Appropriate AJCC T Category: pT1b PR Status: Negative (-) Intent of Therapy: Curative Intent, Discussed with Patient

## 2023-11-21 ENCOUNTER — Other Ambulatory Visit: Payer: Self-pay

## 2023-11-22 ENCOUNTER — Encounter: Payer: Self-pay | Admitting: Oncology

## 2023-11-22 NOTE — Progress Notes (Signed)
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  Ultrasound of the left axilla demonstrated normal lymph nodes.  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  8 mm.  Tumor was ER/PR -0% and HER2 positive +3 by IHC.    Menarche at the age of 64.  She is G5, P5.  Age at first birth 68.  She has used birth control pills in the past.  She attained menopause in her 46s.  Family history significant for breast cancer in 2 sisters of her maternal grandmother.  Mom with cervical cancer in her 20s.  Paternal uncle with stomach cancer.  Her sister has been diagnosed with some form of skin cancer.  Left lumpectomy pathology from 11/04/2023 showed 10 mm grade 3 tumor with negative margins.  3 sentinel lymph nodes negative for malignancy.  Tumor was ER/PR negative and HER2 positive  Interval history-patient is currently recovering well from lumpectomy and denies any complaints at this time  ECOG PS- 0 Pain scale- 0   Review of systems- Review of Systems  Constitutional:  Negative for chills, fever,  malaise/fatigue and weight loss.  HENT:  Negative for congestion, ear discharge and nosebleeds.   Eyes:  Negative for blurred vision.  Respiratory:  Negative for cough, hemoptysis, sputum production, shortness of breath and wheezing.   Cardiovascular:  Negative for chest pain, palpitations, orthopnea and claudication.  Gastrointestinal:  Negative for abdominal pain, blood in stool, constipation, diarrhea, heartburn, melena, nausea and vomiting.  Genitourinary:  Negative for dysuria, flank pain, frequency, hematuria and urgency.  Musculoskeletal:  Negative for back pain, joint pain and myalgias.  Skin:  Negative for rash.  Neurological:  Negative for dizziness, tingling, focal weakness, seizures, weakness and headaches.  Endo/Heme/Allergies:  Does not bruise/bleed easily.  Psychiatric/Behavioral:  Negative for depression and suicidal ideas. The patient does not have insomnia.       Allergies  Allergen Reactions   Flagyl [Metronidazole] Itching     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, estrogen receptor negative (HCC)    Obesity    Ovarian cyst    Palpitations    Sigmoid diverticulitis 08/2012   Subclinical hypothyroidism    SVT (supraventricular tachycardia) (HCC)    on Holter monitor 5 beat run of svt   Thyroid nodule  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   Type 2 diabetes mellitus (HCC)      Past Surgical History:  Procedure Laterality Date   AXILLARY SENTINEL NODE BIOPSY Left 11/04/2023   Procedure: AXILLARY SENTINEL NODE BIOPSY;  Surgeon: Campbell Lerner, MD;  Location: [facility] ORS;  Service: General;  Laterality: Left;   BLADDER SUSPENSION  2009   BREAST BIOPSY Left 10/22/2023    [place] Core Bx, heart clip - path pending   BREAST BIOPSY Left 10/22/2023   [place] LT BREAST BX W LOC DEV 1ST LESION IMG BX SPEC US GUIDE 10/22/2023 [facility]   BREAST LUMPECTOMY WITH RADIOFREQUENCY TAG IDENTIFICATION Left 11/04/2023   Procedure: BREAST LUMPECTOMY WITH RADIOFREQUENCY TAG IDENTIFICATION;  Surgeon: Campbell Lerner, MD;  Location: [facility] ORS;  Service: General;  Laterality: Left;   Colectomy Left    likely sigmoid, open.  for diverticulitis.   COLONOSCOPY WITH PROPOFOL N/A 10/01/2022   Procedure: COLONOSCOPY WITH PROPOFOL;  Surgeon: Toney Reil, MD;  Location: [facility] ENDOSCOPY;  Service: Gastroenterology;  Laterality: N/A;   PORTACATH PLACEMENT Right 11/04/2023   Procedure: INSERTION PORT-A-CATH;  Surgeon: Campbell Lerner, MD;  Location: [facility] ORS;  Service: General;  Laterality: Right;   TUBAL LIGATION     XI ROBOTIC LAPAROSCOPIC ASSISTED APPENDECTOMY N/A 12/29/2022   Procedure: XI ROBOTIC LYSIS OF ADHESIONS AND APPENDECTOMY;  Surgeon: Campbell Lerner, MD;  Location: [facility] ORS;  Service: General;  Laterality: N/A;    Social History   Socioeconomic History   Marital status: Single    Spouse name: Not on file   Number of children: Not on file   Years of education: Not on file   Highest education level: Some college, no degree  Occupational History   Not on file  Tobacco Use   Smoking status: Former    Current packs/day: 0.00    Types: Cigarettes, Cigars    Quit date: 09/19/2011    Years since quitting: 12.1   Smokeless tobacco: Never  Vaping Use   Vaping status: Former  Substance and Sexual Activity   Alcohol use: Not Currently    Comment: Occasional glass of wine   Drug use: No   Sexual activity: Not Currently  Other Topics Concern   Not on file  Social History Narrative   Works in Audiological scientist at [employer].   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   Social Drivers of Corporate investment banker Strain: Low Risk  (06/17/2023)   Overall  Financial Resource Strain (CARDIA)    Difficulty of Paying Living Expenses: Not very hard  Food Insecurity: No Food Insecurity (10/28/2023)   Hunger Vital Sign    Worried About Running Out of Food in the Last Year: Never true    Ran Out of Food in the Last Year: Never true  Transportation Needs: No Transportation Needs (10/28/2023)   PRAPARE - Administrator, Civil Service (Medical): No    Lack of Transportation (Non-Medical): No  Physical Activity: Sufficiently Active (06/17/2023)   Exercise Vital Sign    Days of Exercise per Week: 7 days    Minutes of Exercise per Session: 60 min  Stress: Stress Concern Present (06/17/2023)   [employer] of Occupational Health - Occupational Stress Questionnaire    Feeling of Stress : To some extent  Social Connections: Moderately Integrated (06/17/2023)   Social Connection and Isolation Panel [NHANES]    Frequency of Communication with Friends and Family: More than three times a week    Frequency of Social Gatherings  with Friends and Family: Once a week    Attends Religious Services: More than 4 times per year    Active Member of Clubs or Organizations: Yes    Attends Engineer, structural: More than 4 times per year    Marital Status: Divorced  Intimate Partner Violence: Not At Risk (10/28/2023)   Humiliation, Afraid, Rape, and Kick questionnaire    Fear of Current or Ex-Partner: No    Emotionally Abused: No    Physically Abused: No    Sexually Abused: No    Family History  Problem Relation Age of Onset   Cancer - Cervical Mother        dx 21s; Passed away from Kidney Issues (Not Cancer)   Heart disease Father    Kidney disease Father    Diabetes Father    Skin cancer Sister    Stomach cancer Paternal Uncle    Diabetes Maternal Grandmother    Skin cancer Maternal Grandfather    Diabetes Paternal Grandmother    Breast cancer Other        mat great aunts x2; unk age of dx   Lung cancer Cousin    Brain cancer  Cousin      Current Outpatient Medications:    acetaminophen (TYLENOL) 500 MG tablet, Take 500 mg by mouth every 6 (six) hours as needed., Disp: , Rfl:    atenolol (TENORMIN) 25 MG tablet, TAKE 1 TABLET (25 MG TOTAL) BY MOUTH DAILY. FOR PALPITATIONS. (Patient taking differently: Take 25 mg by mouth at bedtime. For palpitations.), Disp: 90 tablet, Rfl: 2   Biotin 1000 MCG tablet, Take 1,000 mcg by mouth daily., Disp: , Rfl:    blood glucose meter kit and supplies KIT, Dispense based on patient and insurance preference. Use up to four times daily as directed. (FOR ICD-9 250.00, 250.01)., Disp: 1 each, Rfl: 0   cyanocobalamin 500 MCG tablet, Take 1,000 mcg by mouth daily. Reported on 06/12/2016, Disp: , Rfl:    docusate sodium (COLACE) 100 MG capsule, Take 200 mg by mouth daily as needed for moderate constipation. Reported on 06/12/2016, Disp: , Rfl:    ibuprofen (ADVIL) 800 MG tablet, Take 1 tablet (800 mg total) by mouth every 8 (eight) hours as needed., Disp: 30 tablet, Rfl: 0   lidocaine-prilocaine (EMLA) cream, Apply to affected area once, Disp: 30 g, Rfl: 3   magnesium 30 MG tablet, Take 30 mg by mouth daily as needed., Disp: , Rfl:    metFORMIN (GLUCOPHAGE-XR) 500 MG 24 hr tablet, TAKE 1 TABLET (500 MG TOTAL) BY MOUTH DAILY WITH BREAKFAST. FOR DIABETES., Disp: 90 tablet, Rfl: 0   ondansetron (ZOFRAN) 8 MG tablet, Take 1 tablet (8 mg total) by mouth every 8 (eight) hours as needed for nausea or vomiting., Disp: 30 tablet, Rfl: 1   prochlorperazine (COMPAZINE) 10 MG tablet, Take 1 tablet (10 mg total) by mouth every 6 (six) hours as needed for nausea or vomiting., Disp: 30 tablet, Rfl: 1   pyridOXINE (VITAMIN B6) 25 MG tablet, Take 25 mg by mouth daily., Disp: , Rfl:    rosuvastatin (CRESTOR) 5 MG tablet, TAKE 1 TABLET (5 MG TOTAL) BY MOUTH EVERY EVENING. FOR CHOLESTEROL., Disp: 90 tablet, Rfl: 2  Physical exam:  Vitals:   11/20/23 1515  BP: (!) 140/63  Pulse: 95  Resp: 18  Temp: 99.2 F  (37.3 C)  TempSrc: Tympanic  SpO2: 98%  Weight: 209 lb 12.8 oz (95.2 kg)   Physical Exam Cardiovascular:  Rate and Rhythm: Normal rate and regular rhythm.     Heart sounds: Normal heart sounds.  Pulmonary:     Effort: Pulmonary effort is normal.     Breath sounds: Normal breath sounds.  Abdominal:     General: Bowel sounds are normal.     Palpations: Abdomen is soft.  Skin:    General: Skin is warm and dry.  Neurological:     Mental Status: She is alert and oriented to person, place, and time.        Latest Ref Rng & Units 09/02/2023    9:48 AM  CMP  Glucose 70 - 99 mg/dL 638   BUN 6 - 23 mg/dL 14   Creatinine 7.56 - 1.20 mg/dL 4.33   Sodium 295 - 188 mEq/L 140   Potassium 3.5 - 5.1 mEq/L 4.4   Chloride 96 - 112 mEq/L 104   CO2 19 - 32 mEq/L 29   Calcium 8.4 - 10.5 mg/dL 9.8   Total Protein 6.0 - 8.3 g/dL 7.2   Total Bilirubin 0.2 - 1.2 mg/dL 0.7   Alkaline Phos 39 - 117 U/L 83   AST 0 - 37 U/L 27   ALT 0 - 35 U/L 21       Latest Ref Rng & Units 11/02/2023    1:55 PM  CBC  WBC 4.0 - 10.5 K/uL 6.0   Hemoglobin 12.0 - 15.0 g/dL 41.6   Hematocrit 60.6 - 46.0 % 41.0   Platelets 150 - 400 K/uL 324       ECHOCARDIOGRAM COMPLETE Result Date: 11/19/2023    ECHOCARDIOGRAM REPORT   Patient Name:   TARYIAH OLEXA Longsworth Date of Exam: 11/19/2023 Medical Rec #:  [id number]       Height:       65.0 in Accession #:    [id number]      Weight:       209.8 lb Date of Birth:  Feb 28, 1961       BSA:          2.019 m Patient Age:    62 years        BP:           148/79 mmHg Patient Gender: F               HR:           97 bpm. Exam Location:  [facility] Procedure: 2D Echo, Cardiac Doppler, Color Doppler and Strain Analysis Indications:     Chemo Z09  History:         Patient has no prior history of Echocardiogram examinations.                  Risk Factors:Diabetes. Palpitations, SVT.  Sonographer:     Cristela Blue Referring Phys:  [id number] Pete Glatter Jayley Hustead Diagnosing Phys: Julien Nordmann MD   Sonographer Comments: Global longitudinal strain was attempted. IMPRESSIONS  1. Left ventricular ejection fraction, by estimation, is 60 to 65%. The left ventricle has normal function. The left ventricle has no regional wall motion abnormalities. There is mild left ventricular hypertrophy. Left ventricular diastolic parameters are consistent with Grade I diastolic dysfunction (impaired relaxation). The average left ventricular global longitudinal strain is -16.7 %.  2. Right ventricular systolic function is normal. The right ventricular size is normal. There is normal pulmonary artery systolic pressure. The estimated right ventricular systolic pressure is 29.8 mmHg.  3. The mitral valve is normal in structure. Mild mitral valve regurgitation. No  evidence of mitral stenosis.  4. The aortic valve has an indeterminant number of cusps. Aortic valve regurgitation is not visualized. No aortic stenosis is present.  5. The inferior vena cava is normal in size with greater than 50% respiratory variability, suggesting right atrial pressure of 3 mmHg. FINDINGS  Left Ventricle: Left ventricular ejection fraction, by estimation, is 60 to 65%. The left ventricle has normal function. The left ventricle has no regional wall motion abnormalities. The average left ventricular global longitudinal strain is -16.7 %. The left ventricular internal cavity size was normal in size. There is mild left ventricular hypertrophy. Left ventricular diastolic parameters are consistent with Grade I diastolic dysfunction (impaired relaxation). Right Ventricle: The right ventricular size is normal. No increase in right ventricular wall thickness. Right ventricular systolic function is normal. There is normal pulmonary artery systolic pressure. The tricuspid regurgitant velocity is 2.49 m/s, and  with an assumed right atrial pressure of 5 mmHg, the estimated right ventricular systolic pressure is 29.8 mmHg. Left Atrium: Left atrial size was normal in  size. Right Atrium: Right atrial size was normal in size. Pericardium: There is no evidence of pericardial effusion. Mitral Valve: The mitral valve is normal in structure. Mild mitral annular calcification. Mild mitral valve regurgitation. No evidence of mitral valve stenosis. MV peak gradient, 6.2 mmHg. The mean mitral valve gradient is 4.0 mmHg. Tricuspid Valve: The tricuspid valve is normal in structure. Tricuspid valve regurgitation is mild . No evidence of tricuspid stenosis. Aortic Valve: The aortic valve has an indeterminant number of cusps. Aortic valve regurgitation is not visualized. No aortic stenosis is present. Aortic valve mean gradient measures 5.0 mmHg. Aortic valve peak gradient measures 7.4 mmHg. Aortic valve area, by VTI measures 2.28 cm. Pulmonic Valve: The pulmonic valve was normal in structure. Pulmonic valve regurgitation is not visualized. No evidence of pulmonic stenosis. Aorta: The aortic root is normal in size and structure. Venous: The inferior vena cava is normal in size with greater than 50% respiratory variability, suggesting right atrial pressure of 3 mmHg. IAS/Shunts: No atrial level shunt detected by color flow Doppler.  LEFT VENTRICLE PLAX 2D LVIDd:         3.90 cm   Diastology LVIDs:         2.80 cm   LV e' medial:    8.81 cm/s LV PW:         1.10 cm   LV E/e' medial:  11.9 LV IVS:        1.10 cm   LV e' lateral:   8.05 cm/s LVOT diam:     2.00 cm   LV E/e' lateral: 13.0 LV SV:         66 LV SV Index:   33        2D Longitudinal Strain LVOT Area:     3.14 cm  2D Strain GLS Avg:     -16.7 %  RIGHT VENTRICLE RV Basal diam:  3.70 cm RV Mid diam:    2.70 cm RV S prime:     17.80 cm/s TAPSE (M-mode): 2.7 cm LEFT ATRIUM             Index        RIGHT ATRIUM           Index LA diam:        3.30 cm 1.63 cm/m   RA Area:     12.10 cm LA Vol (A2C):   33.2 ml 16.45 ml/m  RA Volume:  25.30 ml  12.53 ml/m LA Vol (A4C):   28.9 ml 14.32 ml/m LA Biplane Vol: 31.7 ml 15.70 ml/m  AORTIC  VALVE AV Area (Vmax):    2.10 cm AV Area (Vmean):   1.86 cm AV Area (VTI):     2.28 cm AV Vmax:           136.00 cm/s AV Vmean:          103.450 cm/s AV VTI:            0.290 m AV Peak Grad:      7.4 mmHg AV Mean Grad:      5.0 mmHg LVOT Vmax:         91.00 cm/s LVOT Vmean:        61.300 cm/s LVOT VTI:          0.210 m LVOT/AV VTI ratio: 0.73  AORTA Ao Root diam: 2.50 cm MITRAL VALVE                TRICUSPID VALVE MV Area (PHT): 4.36 cm     TR Peak grad:   24.8 mmHg MV Area VTI:   2.33 cm     TR Vmax:        249.00 cm/s MV Peak grad:  6.2 mmHg MV Mean grad:  4.0 mmHg     SHUNTS MV Vmax:       1.24 m/s     Systemic VTI:  0.21 m MV Vmean:      90.9 cm/s    Systemic Diam: 2.00 cm MV Decel Time: 174 msec MV E velocity: 105.00 cm/s MV A velocity: 123.00 cm/s MV E/A ratio:  0.85 Julien Nordmann MD Electronically signed by Julien Nordmann MD Signature Date/Time: 11/19/2023/4:24:59 PM    Final    DG Chest Port 1 View Result Date: 11/04/2023 CLINICAL DATA:  [id number] Port-A-Cath in place [id number] EXAM: PORTABLE CHEST 1 VIEW COMPARISON:  None Available. FINDINGS: Right chest wall port-a-cath with tip overlying the superior caovatrial junction. The heart and mediastinal contours are within normal limits. Question retrocardiac airspace opacity. No pulmonary edema. No pleural effusion. No pneumothorax. No acute osseous abnormality. IMPRESSION: Question retrocardiac airspace opacity. Finding could represent atelectasis. Electronically Signed   By: Tish Frederickson M.D.   On: 11/04/2023 18:33   MM Breast Surgical Specimen Result Date: 11/04/2023 CLINICAL DATA:  Status post [facility] localized LEFT breast lumpectomy. Biopsy-proven grade 2 invasive mammary carcinoma no special type. EXAM: SPECIMEN RADIOGRAPH OF THE LEFT BREAST COMPARISON:  Previous exam(s). FINDINGS: Status post excision of the LEFT breast. The [facility] and the heart shaped tissue marking clip are present within the specimen. IMPRESSION: Specimen  radiograph of the LEFT breast. Electronically Signed   By: Hulan Saas M.D.   On: 11/04/2023 12:46   DG C-Arm 1-60 Min-No Report Result Date: 11/04/2023 Fluoroscopy was utilized by the requesting physician.  No radiographic interpretation.   NM Sentinel Node Inj-No Rpt (Breast) Result Date: 11/04/2023 Sulfur Colloid was injected by the Nuclear Medicine Technologist for sentinel lymph node localization.   MM LT RADIO FREQUENCY TAG LOC MAMMO GUIDE Result Date: 11/02/2023 CLINICAL DATA:  Patient for preoperative localization prior to left lumpectomy. EXAM: NEEDLE LOCALIZATION OF THE LEFT BREAST WITH MAMMO GUIDANCE COMPARISON:  Previous exam(s). FINDINGS: Patient presents for needle localization prior to left lumpectomy. I met with the patient and we discussed the procedure of needle localization including benefits and alternatives. We discussed the high likelihood of a successful procedure. We discussed  the risks of the procedure, including infection, bleeding, tissue injury, and further surgery. Informed, written consent was given. The usual time-out protocol was performed immediately prior to the procedure. Using mammographic guidance, sterile technique, 1% lidocaine and a 10 cm SAVI SCOUT needle, the mass and clip was localized using a cranial approach. Reflector function was confirmed with an auditory signal from the SAVI SCOUT guide. The images were marked for Rodenberg. IMPRESSION: Radar reflector localization of the left breast. No apparent complications. Electronically Signed   By: Annia Belt M.D.   On: 11/02/2023 16:09     Assessment and plan- Patient is a 62 y.o. 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  3 sentinel lymph nodes negative for malignancy.  This constitutes stage Ia disease.  Given that she is HER2 positive I would recommend adjuvant Herceptin and Taxol as per APP study.  Taxol will be given at 80 mg/m weekly for 12 cycles.  Herceptin will be given every 3 weeks to complete 1 year of treatment.  Discussed risks and benefits of Taxol including all but not limited to nausea vomiting low blood counts risk of infections hospitalization as well as peripheral neuropathy had nausea and infusion reaction associated with Taxol.  Discussed risks and benefits of Herceptin including all but not limited to skin rash diarrhea and possible cardiotoxicity.  Baseline echocardiogram is normal.  We will tentatively plan to start treatment in 1 week's time.  Treatment will be given with a curative intent.  Patient understands and agrees to proceed as planned  In a phase II study that enrolled 410 patients with node-negative disease up to 3 cm (31 percent with T1b lesions and 17 percent with T1a lesions), treatment with paclitaxel (80 mg/m2) weekly for 12 weeks plus trastuzumab for one year was associated with:   ?The three-year rate of survival free from invasive disease was 98.7 percent (95% CI 97.6-99.8). The three-year rate of recurrence-free survival was 99.2 percent (95% CI 98.4-100.0). There was no difference seen when patients were stratified by tumor size (<=1 versus >1 cm).  ?Outcomes at longer-term follow-up remained satisfactory, with seven-year and 10-year DFS of 93 and 91 percent, respectively, and ?? of 95 and 94 percent, respectively.   Cancer Staging  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 in female, estrogen receptor negative (HCC) Staging form: Breast, AJCC 8th Edition - Clinical stage from 10/28/2023: Stage IA (cT1b, cN0, cM0, G2, ER-, PR-, HER2+) - Signed by Creig Hines, MD on 10/28/2023 Histologic grading system: 3 grade system - Pathologic stage from  11/20/2023: Stage IA (pT1b, pN0, cM0, G3, ER-, PR-, HER2+) - Signed by Creig Hines, MD on 11/20/2023 Stage prefix: Initial diagnosis Multigene prognostic tests performed: None Histologic grading system: 3 grade system     Visit Diagnosis 1. 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 in female, estrogen receptor negative (HCC)   2. Goals of care, counseling/discussion      Dr. Owens Shark, MD, MPH [facility] [phone] 11/22/2023 2:49 PM

## 2023-11-23 ENCOUNTER — Encounter: Payer: Self-pay | Admitting: *Deleted

## 2023-11-23 ENCOUNTER — Encounter: Payer: Self-pay | Admitting: Oncology

## 2023-11-23 NOTE — Progress Notes (Signed)
Rebecca Macias called with several questions about her nausea medications and about chemo class.  She is scheduled for chemo class on Thursday and 1st chemo on Friday.

## 2023-11-23 NOTE — Progress Notes (Signed)
Pharmacist Chemotherapy Monitoring - Initial Assessment    Anticipated start date: 11/27/23   The following has been reviewed per standard work regarding the patient's treatment regimen: The patient's diagnosis, treatment plan and drug doses, and organ/hematologic function Lab orders and baseline tests specific to treatment regimen  The treatment plan start date, drug sequencing, and pre-medications Prior authorization status  Patient's documented medication list, including drug-drug interaction screen and prescriptions for anti-emetics and supportive care specific to the treatment regimen The drug concentrations, fluid compatibility, administration routes, and timing of the medications to be used The patient's access for treatment and lifetime cumulative dose history, if applicable  The patient's medication allergies and previous infusion related reactions, if applicable   Changes made to treatment plan:  N/A  Follow up needed:  Pending authorization for treatment    Ebony Hail, Pharm.D., CPP 11/23/2023@1 :07 PM

## 2023-11-24 ENCOUNTER — Encounter: Payer: Self-pay | Admitting: Oncology

## 2023-11-24 ENCOUNTER — Other Ambulatory Visit: Payer: Self-pay

## 2023-11-25 ENCOUNTER — Other Ambulatory Visit: Payer: Self-pay | Admitting: Oncology

## 2023-11-26 ENCOUNTER — Other Ambulatory Visit: Payer: Self-pay

## 2023-11-26 ENCOUNTER — Inpatient Hospital Stay: Payer: Commercial Managed Care - PPO

## 2023-11-27 ENCOUNTER — Other Ambulatory Visit: Payer: Self-pay | Admitting: Oncology

## 2023-11-27 ENCOUNTER — Inpatient Hospital Stay: Payer: Commercial Managed Care - PPO

## 2023-11-27 ENCOUNTER — Ambulatory Visit: Payer: Commercial Managed Care - PPO | Admitting: Oncology

## 2023-11-27 ENCOUNTER — Telehealth: Payer: Self-pay | Admitting: *Deleted

## 2023-11-27 VITALS — BP 119/63 | HR 72 | Temp 98.0°F | Resp 16 | Wt 208.4 lb

## 2023-11-27 VITALS — BP 127/68 | HR 83 | Temp 97.2°F | Resp 16

## 2023-11-27 DIAGNOSIS — C50312 Malignant neoplasm of lower-inner quadrant of left female breast: Secondary | ICD-10-CM

## 2023-11-27 DIAGNOSIS — Z5112 Encounter for antineoplastic immunotherapy: Secondary | ICD-10-CM | POA: Diagnosis not present

## 2023-11-27 LAB — CMP (CANCER CENTER ONLY)
ALT: 29 U/L (ref 0–44)
AST: 32 U/L (ref 15–41)
Albumin: 3.6 g/dL (ref 3.5–5.0)
Alkaline Phosphatase: 79 U/L (ref 38–126)
Anion gap: 10 (ref 5–15)
BUN: 16 mg/dL (ref 8–23)
CO2: 24 mmol/L (ref 22–32)
Calcium: 8.9 mg/dL (ref 8.9–10.3)
Chloride: 104 mmol/L (ref 98–111)
Creatinine: 0.83 mg/dL (ref 0.44–1.00)
GFR, Estimated: >60 mL/min (ref >60)
Glucose, Bld: 215 mg/dL — ABNORMAL HIGH (ref 70–99)
Potassium: 3.7 mmol/L (ref 3.5–5.1)
Sodium: 138 mmol/L (ref 135–145)
Total Bilirubin: 0.5 mg/dL (ref <1.2)
Total Protein: 7.2 g/dL (ref 6.5–8.1)

## 2023-11-27 LAB — CBC WITH DIFFERENTIAL (CANCER CENTER ONLY)
Abs Immature Granulocytes: 0.02 10*3/uL (ref 0.00–0.07)
Basophils Absolute: 0.1 10*3/uL (ref 0.0–0.1)
Basophils Relative: 1 %
Eosinophils Absolute: 0.4 10*3/uL (ref 0.0–0.5)
Eosinophils Relative: 7 %
HCT: 38.8 % (ref 36.0–46.0)
Hemoglobin: 13 g/dL (ref 12.0–15.0)
Immature Granulocytes: 0 %
Lymphocytes Relative: 47 %
Lymphs Abs: 2.9 10*3/uL (ref 0.7–4.0)
MCH: 30.3 pg (ref 26.0–34.0)
MCHC: 33.5 g/dL (ref 30.0–36.0)
MCV: 90.4 fL (ref 80.0–100.0)
Monocytes Absolute: 0.5 10*3/uL (ref 0.1–1.0)
Monocytes Relative: 8 %
Neutro Abs: 2.3 10*3/uL (ref 1.7–7.7)
Neutrophils Relative %: 37 %
Platelet Count: 326 10*3/uL (ref 150–400)
RBC: 4.29 MIL/uL (ref 3.87–5.11)
RDW: 12.8 % (ref 11.5–15.5)
WBC Count: 6.1 10*3/uL (ref 4.0–10.5)
nRBC: 0 % (ref 0.0–0.2)

## 2023-11-27 MED ORDER — SODIUM CHLORIDE 0.9 % IV SOLN
INTRAVENOUS | Status: AC
Start: 2023-11-27 — End: ?
  Filled 2023-11-27: qty 250

## 2023-11-27 MED ORDER — TRASTUZUMAB-ANNS CHEMO 150 MG IV SOLR
400.0000 mg | Freq: Once | INTRAVENOUS | Status: AC
  Administered 2023-11-27: 400 mg via INTRAVENOUS
  Filled 2023-11-27: qty 19.05

## 2023-11-27 MED ORDER — DIPHENHYDRAMINE HCL 50 MG/ML IJ SOLN
50.0000 mg | Freq: Once | INTRAMUSCULAR | Status: AC
Start: 2023-11-27 — End: 2023-11-27
  Administered 2023-11-27: 50 mg via INTRAVENOUS
  Filled 2023-11-27: qty 1

## 2023-11-27 MED ORDER — DEXAMETHASONE SODIUM PHOSPHATE 10 MG/ML IJ SOLN
10.0000 mg | Freq: Once | INTRAMUSCULAR | Status: AC
Start: 2023-11-27 — End: 2023-11-27
  Administered 2023-11-27: 10 mg via INTRAVENOUS
  Filled 2023-11-27: qty 1

## 2023-11-27 MED ORDER — FAMOTIDINE IN NACL 20-0.9 MG/50ML-% IV SOLN
20.0000 mg | Freq: Once | INTRAVENOUS | Status: AC
  Administered 2023-11-27: 20 mg via INTRAVENOUS
  Filled 2023-11-27: qty 50

## 2023-11-27 MED ORDER — METHYLPREDNISOLONE SODIUM SUCC 125 MG IJ SOLR
125.0000 mg | Freq: Once | INTRAMUSCULAR | Status: AC | PRN
  Administered 2023-11-27: 125 mg via INTRAVENOUS

## 2023-11-27 MED ORDER — HEPARIN SOD (PORK) LOCK FLUSH 100 UNIT/ML IV SOLN
500.0000 [IU] | Freq: Once | INTRAVENOUS | Status: AC | PRN
Start: 2023-11-27 — End: 2023-11-27
  Administered 2023-11-27: 500 [IU]
  Filled 2023-11-27: qty 5

## 2023-11-27 MED ORDER — SODIUM CHLORIDE 0.9 % IV SOLN
Freq: Once | INTRAVENOUS | Status: DC | PRN
  Filled 2023-11-27: qty 250

## 2023-11-27 MED ORDER — ACETAMINOPHEN 325 MG PO TABS
650.0000 mg | ORAL_TABLET | Freq: Once | ORAL | Status: AC
  Administered 2023-11-27: 650 mg via ORAL
  Filled 2023-11-27: qty 2

## 2023-11-27 MED ORDER — SODIUM CHLORIDE 0.9 % IV SOLN
80.0000 mg/m2 | Freq: Once | INTRAVENOUS | Status: AC
  Administered 2023-11-27: 168 mg via INTRAVENOUS
  Filled 2023-11-27: qty 28

## 2023-11-27 MED ORDER — DIPHENHYDRAMINE HCL 50 MG/ML IJ SOLN
50.0000 mg | Freq: Once | INTRAMUSCULAR | Status: AC | PRN
  Administered 2023-11-27: 50 mg via INTRAVENOUS

## 2023-11-27 NOTE — Progress Notes (Signed)
Hypersensitivity Reaction note  Date of event: 11/27/23 Time of event: 1205 Generic name of drug involved: paclitaxel  Name of provider notified of the hypersensitivity reaction: Josh Borders, NP  Was agent that likely caused hypersensitivity reaction added to Allergies List within EMR? yes Chain of events including reaction signs/symptoms, treatment administered, and outcome (e.g., drug resumed; drug discontinued; sent to Emergency Department; etc.) Paclitaxel infusion started at 1157.  At 1205 pt stated her chest hurt and she "felt hot".  Infusion paused.  Laurette Schimke, NP notified & currently present in treatment room.  Pt appeared uncomfortable and flushed.  Pt then c/o back pain, chest pain, and feeling of her face on fire.  NS started wide open, 1207 25mg  benadryl IV given, 1208 125mg  solumedrol given.  Pt noted to be slightly hypertensive above base line at 1209 138/75, RR 22, HR 87, 98% O2 RA.  Pt began to return to baseline shortly after benadryl and solumedrol administered. Laurette Schimke, NP notified Dr Smith Robert.  Decision made not to rechallenge patient.  VSS (See flowsheet).  Pt deaccessed.  Pt d/c'd, ambulatory from treatment room.    Storm Frisk, RN 11/27/2023 1:20 PM

## 2023-11-27 NOTE — Patient Instructions (Signed)
[facility] OF [facility]  Discharge Instructions: Thank you for choosing [facility] to provide your oncology and hematology care.  If you have a lab appointment with the Cancer Center, please go directly to the Cancer Center and check in at the registration area.  Wear comfortable clothing and clothing appropriate for easy access to any Portacath or PICC line.   We strive to give you quality time with your provider. You may need to reschedule your appointment if you arrive late (15 or more minutes).  Arriving late affects you and other patients whose appointments are after yours.  Also, if you miss three or more appointments without notifying the office, you may be dismissed from the clinic at the provider's discretion.      For prescription refill requests, have your pharmacy contact our office and allow 72 hours for refills to be completed.    Today you received the following chemotherapy and/or immunotherapy agents taxol/herceptin     To help prevent nausea and vomiting after your treatment, we encourage you to take your nausea medication as directed.  BELOW ARE SYMPTOMS THAT SHOULD BE REPORTED IMMEDIATELY: *FEVER GREATER THAN 100.4 F (38 C) OR HIGHER *CHILLS OR SWEATING *NAUSEA AND VOMITING THAT IS NOT CONTROLLED WITH YOUR NAUSEA MEDICATION *UNUSUAL SHORTNESS OF BREATH *UNUSUAL BRUISING OR BLEEDING *URINARY PROBLEMS (pain or burning when urinating, or frequent urination) *BOWEL PROBLEMS (unusual diarrhea, constipation, pain near the anus) TENDERNESS IN MOUTH AND THROAT WITH OR WITHOUT PRESENCE OF ULCERS (sore throat, sores in mouth, or a toothache) UNUSUAL RASH, SWELLING OR PAIN  UNUSUAL VAGINAL DISCHARGE OR ITCHING   Items with * indicate a potential emergency and should be followed up as soon as possible or go to the Emergency Department if any problems should occur.  Please show the CHEMOTHERAPY ALERT CARD or  IMMUNOTHERAPY ALERT CARD at check-in to the Emergency Department and triage nurse.  Should you have questions after your visit or need to cancel or reschedule your appointment, please contact [facility] - A DEPT OF [facility]  [phone] and follow the prompts.  Office hours are 8:00 a.m. to 4:30 p.m. Monday - Friday. Please note that voicemails left after 4:00 p.m. may not be returned until the following business day.  We are closed weekends and major holidays. You have access to a nurse at all times for urgent questions. Please call the main number to the clinic [phone] and follow the prompts.  For any non-urgent questions, you may also contact your provider using MyChart. We now offer e-Visits for anyone 60 and older to request care online for non-urgent symptoms. For details visit [email or web address].   Also download the MyChart app! Go to the app store, search "MyChart", open the app, select [place], and log in with your MyChart username and password.  Paclitaxel Injection What is this medication? PACLITAXEL (PAK li TAX el) treats some types of cancer. It works by slowing down the growth of cancer cells. This medicine may be used for other purposes; ask your health care provider or pharmacist if you have questions. COMMON BRAND NAME(S): Onxol, Taxol What should I tell my care team before I take this medication? They need to know if you have any of these conditions: Heart disease Liver disease Low white blood cell levels An unusual or allergic reaction to paclitaxel, other medications, foods, dyes, or preservatives If you or your  partner are pregnant or trying to get pregnant Breast-feeding How should I use this medication? This medication is injected into a vein. It is given by your care team in a hospital or clinic setting. Talk to your care team about the use of this medication in children. While it may be given to children for selected  conditions, precautions do apply. Overdosage: If you think you have taken too much of this medicine contact a poison control center or emergency room at once. NOTE: This medicine is only for you. Do not share this medicine with others. What if I miss a dose? Keep appointments for follow-up doses. It is important not to miss your dose. Call your care team if you are unable to keep an appointment. What may interact with this medication? Do not take this medication with any of the following: Live virus vaccines Other medications may affect the way this medication works. Talk with your care team about all of the medications you take. They may suggest changes to your treatment plan to lower the risk of side effects and to make sure your medications work as intended. This list may not describe all possible interactions. Give your health care provider a list of all the medicines, herbs, non-prescription drugs, or dietary supplements you use. Also tell them if you smoke, drink alcohol, or use illegal drugs. Some items may interact with your medicine. What should I watch for while using this medication? Your condition will be monitored carefully while you are receiving this medication. You may need blood work while taking this medication. This medication may make you feel generally unwell. This is not uncommon as chemotherapy can affect healthy cells as well as cancer cells. Report any side effects. Continue your course of treatment even though you feel ill unless your care team tells you to stop. This medication can cause serious allergic reactions. To reduce the risk, your care team may give you other medications to take before receiving this one. Be sure to follow the directions from your care team. This medication may increase your risk of getting an infection. Call your care team for advice if you get a fever, chills, sore throat, or other symptoms of a cold or flu. Do not treat yourself. Try to avoid  being around people who are sick. This medication may increase your risk to bruise or bleed. Call your care team if you notice any unusual bleeding. Be careful brushing or flossing your teeth or using a toothpick because you may get an infection or bleed more easily. If you have any dental work done, tell your dentist you are receiving this medication. Talk to your care team if you may be pregnant. Serious birth defects can occur if you take this medication during pregnancy. Talk to your care team before breastfeeding. Changes to your treatment plan may be needed. What side effects may I notice from receiving this medication? Side effects that you should report to your care team as soon as possible: Allergic reactions--skin rash, itching, hives, swelling of the face, lips, tongue, or throat Heart rhythm changes--fast or irregular heartbeat, dizziness, feeling faint or lightheaded, chest pain, trouble breathing Increase in blood pressure Infection--fever, chills, cough, sore throat, wounds that don't heal, pain or trouble when passing urine, general feeling of discomfort or being unwell Low blood pressure--dizziness, feeling faint or lightheaded, blurry vision Low red blood cell level--unusual weakness or fatigue, dizziness, headache, trouble breathing Painful swelling, warmth, or redness of the skin, blisters or sores at the  infusion site Pain, tingling, or numbness in the hands or feet Slow heartbeat--dizziness, feeling faint or lightheaded, confusion, trouble breathing, unusual weakness or fatigue Unusual bruising or bleeding Side effects that usually do not require medical attention (report to your care team if they continue or are bothersome): Diarrhea Hair loss Joint pain Loss of appetite Muscle pain Nausea Vomiting This list may not describe all possible side effects. Call your doctor for medical advice about side effects. You may report side effects to FDA at 1-800-FDA-1088. Where  should I keep my medication? This medication is given in a hospital or clinic. It will not be stored at home. NOTE: This sheet is a summary. It may not cover all possible information. If you have questions about this medicine, talk to your doctor, pharmacist, or health care provider.  2024 Elsevier/Gold Standard (2022-04-15 00:00:00)  Trastuzumab Injection What is this medication? TRASTUZUMAB (tras TOO zoo mab) treats breast cancer and stomach cancer. It works by blocking a protein that causes cancer cells to grow and multiply. This helps to slow or stop the spread of cancer cells. This medicine may be used for other purposes; ask your health care provider or pharmacist if you have questions. COMMON BRAND NAME(S): Herceptin, Marlowe Alt, Ontruzant, Trazimera What should I tell my care team before I take this medication? They need to know if you have any of these conditions: Heart failure Lung disease An unusual or allergic reaction to trastuzumab, other medications, foods, dyes, or preservatives Pregnant or trying to get pregnant Breast-feeding How should I use this medication? This medication is injected into a vein. It is given by your care team in a hospital or clinic setting. Talk to your care team about the use of this medication in children. It is not approved for use in children. Overdosage: If you think you have taken too much of this medicine contact a poison control center or emergency room at once. NOTE: This medicine is only for you. Do not share this medicine with others. What if I miss a dose? Keep appointments for follow-up doses. It is important not to miss your dose. Call your care team if you are unable to keep an appointment. What may interact with this medication? Certain types of chemotherapy, such as daunorubicin, doxorubicin, epirubicin, idarubicin This list may not describe all possible interactions. Give your health care provider a list of all the  medicines, herbs, non-prescription drugs, or dietary supplements you use. Also tell them if you smoke, drink alcohol, or use illegal drugs. Some items may interact with your medicine. What should I watch for while using this medication? Your condition will be monitored carefully while you are receiving this medication. This medication may make you feel generally unwell. This is not uncommon, as chemotherapy affects healthy cells as well as cancer cells. Report any side effects. Continue your course of treatment even though you feel ill unless your care team tells you to stop. This medication may increase your risk of getting an infection. Call your care team for advice if you get a fever, chills, sore throat, or other symptoms of a cold or flu. Do not treat yourself. Try to avoid being around people who are sick. Avoid taking medications that contain aspirin, acetaminophen, ibuprofen, naproxen, or ketoprofen unless instructed by your care team. These medications can hide a fever. Talk to your care team if you may be pregnant. Serious birth defects can occur if you take this medication during pregnancy and for 7 months after  the last dose. You will need a negative pregnancy test before starting this medication. Contraception is recommended while taking this medication and for 7 months after the last dose. Your care team can help you find the option that works for you. Do not breastfeed while taking this medication and for 7 months after stopping treatment. What side effects may I notice from receiving this medication? Side effects that you should report to your care team as soon as possible: Allergic reactions or angioedema--skin rash, itching or hives, swelling of the face, eyes, lips, tongue, arms, or legs, trouble swallowing or breathing Dry cough, shortness of breath or trouble breathing Heart failure--shortness of breath, swelling of the ankles, feet, or hands, sudden weight gain, unusual weakness or  fatigue Infection--fever, chills, cough, or sore throat Infusion reactions--chest pain, shortness of breath or trouble breathing, feeling faint or lightheaded Side effects that usually do not require medical attention (report to your care team if they continue or are bothersome): Diarrhea Dizziness Headache Nausea Trouble sleeping Vomiting This list may not describe all possible side effects. Call your doctor for medical advice about side effects. You may report side effects to FDA at 1-800-FDA-1088. Where should I keep my medication? This medication is given in a hospital or clinic. It will not be stored at home. NOTE: This sheet is a summary. It may not cover all possible information. If you have questions about this medicine, talk to your doctor, pharmacist, or health care provider.  2024 Elsevier/Gold Standard (2022-04-08 00:00:00)

## 2023-11-27 NOTE — Telephone Encounter (Signed)
Faxed FMLA to [employer] at [phone] °

## 2023-11-30 ENCOUNTER — Telehealth: Payer: Self-pay

## 2023-11-30 ENCOUNTER — Ambulatory Visit: Payer: Commercial Managed Care - PPO

## 2023-11-30 NOTE — Telephone Encounter (Signed)
Telephone call to patient for follow up after receiving first infusion.   Patient states she had a reaction right after the  infusion started.  States eating good and drinking plenty of fluids.   She complained of having some  nausea but relieved with anti-nausea meds. Encouraged patient to call for any concerns or questions.

## 2023-12-01 ENCOUNTER — Encounter: Payer: Self-pay | Admitting: *Deleted

## 2023-12-03 ENCOUNTER — Other Ambulatory Visit: Payer: Self-pay | Admitting: Oncology

## 2023-12-03 ENCOUNTER — Encounter: Payer: Self-pay | Admitting: Oncology

## 2023-12-04 ENCOUNTER — Other Ambulatory Visit: Payer: Self-pay

## 2023-12-04 ENCOUNTER — Encounter: Payer: Self-pay | Admitting: Oncology

## 2023-12-04 ENCOUNTER — Ambulatory Visit: Payer: Commercial Managed Care - PPO

## 2023-12-04 ENCOUNTER — Inpatient Hospital Stay: Payer: Commercial Managed Care - PPO

## 2023-12-04 VITALS — BP 136/71 | HR 74 | Temp 96.9°F | Resp 16

## 2023-12-04 DIAGNOSIS — Z5112 Encounter for antineoplastic immunotherapy: Secondary | ICD-10-CM | POA: Diagnosis not present

## 2023-12-04 DIAGNOSIS — Z171 Estrogen receptor negative status [ER-]: Secondary | ICD-10-CM

## 2023-12-04 LAB — CBC WITH DIFFERENTIAL (CANCER CENTER ONLY)
Abs Immature Granulocytes: 0.03 10*3/uL (ref 0.00–0.07)
Basophils Absolute: 0.1 10*3/uL (ref 0.0–0.1)
Basophils Relative: 1 %
Eosinophils Absolute: 0.3 10*3/uL (ref 0.0–0.5)
Eosinophils Relative: 6 %
HCT: 36.9 % (ref 36.0–46.0)
Hemoglobin: 12.4 g/dL (ref 12.0–15.0)
Immature Granulocytes: 1 %
Lymphocytes Relative: 41 %
Lymphs Abs: 2.6 10*3/uL (ref 0.7–4.0)
MCH: 30.5 pg (ref 26.0–34.0)
MCHC: 33.6 g/dL (ref 30.0–36.0)
MCV: 90.7 fL (ref 80.0–100.0)
Monocytes Absolute: 0.5 10*3/uL (ref 0.1–1.0)
Monocytes Relative: 9 %
Neutro Abs: 2.6 10*3/uL (ref 1.7–7.7)
Neutrophils Relative %: 42 %
Platelet Count: 300 10*3/uL (ref 150–400)
RBC: 4.07 MIL/uL (ref 3.87–5.11)
RDW: 12.8 % (ref 11.5–15.5)
WBC Count: 6.1 10*3/uL (ref 4.0–10.5)
nRBC: 0 % (ref 0.0–0.2)

## 2023-12-04 LAB — CMP (CANCER CENTER ONLY)
ALT: 34 U/L (ref 0–44)
AST: 31 U/L (ref 15–41)
Albumin: 3.5 g/dL (ref 3.5–5.0)
Alkaline Phosphatase: 81 U/L (ref 38–126)
Anion gap: 8 (ref 5–15)
BUN: 13 mg/dL (ref 8–23)
CO2: 24 mmol/L (ref 22–32)
Calcium: 8.9 mg/dL (ref 8.9–10.3)
Chloride: 106 mmol/L (ref 98–111)
Creatinine: 0.63 mg/dL (ref 0.44–1.00)
GFR, Estimated: >60 mL/min (ref >60)
Glucose, Bld: 233 mg/dL — ABNORMAL HIGH (ref 70–99)
Potassium: 3.7 mmol/L (ref 3.5–5.1)
Sodium: 138 mmol/L (ref 135–145)
Total Bilirubin: 0.4 mg/dL (ref <1.2)
Total Protein: 7 g/dL (ref 6.5–8.1)

## 2023-12-04 MED ORDER — DEXAMETHASONE SODIUM PHOSPHATE 10 MG/ML IJ SOLN
10.0000 mg | Freq: Once | INTRAMUSCULAR | Status: AC
  Administered 2023-12-04: 10 mg via INTRAVENOUS
  Filled 2023-12-04: qty 1

## 2023-12-04 MED ORDER — HEPARIN SOD (PORK) LOCK FLUSH 100 UNIT/ML IV SOLN
500.0000 [IU] | Freq: Once | INTRAVENOUS | Status: AC | PRN
  Administered 2023-12-04: 500 [IU]
  Filled 2023-12-04: qty 5

## 2023-12-04 MED ORDER — SODIUM CHLORIDE 0.9% FLUSH
10.0000 mL | INTRAVENOUS | Status: DC | PRN
  Administered 2023-12-04: 10 mL
  Filled 2023-12-04: qty 10

## 2023-12-04 MED ORDER — DIPHENHYDRAMINE HCL 50 MG/ML IJ SOLN
50.0000 mg | Freq: Once | INTRAMUSCULAR | Status: AC
Start: 2023-12-04 — End: 2023-12-04
  Administered 2023-12-04: 50 mg via INTRAVENOUS
  Filled 2023-12-04: qty 1

## 2023-12-04 MED ORDER — FAMOTIDINE IN NACL 20-0.9 MG/50ML-% IV SOLN
20.0000 mg | Freq: Once | INTRAVENOUS | Status: AC
  Administered 2023-12-04: 20 mg via INTRAVENOUS
  Filled 2023-12-04: qty 50

## 2023-12-04 MED ORDER — TRASTUZUMAB-ANNS CHEMO 150 MG IV SOLR
2.0000 mg/kg | Freq: Once | INTRAVENOUS | Status: AC
  Administered 2023-12-04: 189 mg via INTRAVENOUS
  Filled 2023-12-04: qty 9

## 2023-12-04 MED ORDER — SODIUM CHLORIDE 0.9 % IV SOLN
INTRAVENOUS | Status: DC
  Filled 2023-12-04 (×2): qty 250

## 2023-12-04 MED ORDER — PACLITAXEL PROTEIN-BOUND CHEMO INJECTION 100 MG
100.0000 mg/m2 | Freq: Once | INTRAVENOUS | Status: AC
  Administered 2023-12-04: 200 mg via INTRAVENOUS
  Filled 2023-12-04: qty 40

## 2023-12-04 MED ORDER — ACETAMINOPHEN 325 MG PO TABS
650.0000 mg | ORAL_TABLET | Freq: Once | ORAL | Status: AC
  Administered 2023-12-04: 650 mg via ORAL
  Filled 2023-12-04: qty 2

## 2023-12-04 NOTE — Patient Instructions (Signed)
[facility] - A DEPT OF [facility]  Discharge Instructions: Thank you for choosing [facility] to provide your oncology and hematology care.  If you have a lab appointment with the Cancer Center, please go directly to the Cancer Center and check in at the registration area.  Wear comfortable clothing and clothing appropriate for easy access to any Portacath or PICC line.   We strive to give you quality time with your provider. You may need to reschedule your appointment if you arrive late (15 or more minutes).  Arriving late affects you and other patients whose appointments are after yours.  Also, if you miss three or more appointments without notifying the office, you may be dismissed from the clinic at the provider's discretion.      For prescription refill requests, have your pharmacy contact our office and allow 72 hours for refills to be completed.    Today you received the following chemotherapy and/or immunotherapy agents- trastuzumab, abraxane      To help prevent nausea and vomiting after your treatment, we encourage you to take your nausea medication as directed.  BELOW ARE SYMPTOMS THAT SHOULD BE REPORTED IMMEDIATELY: *FEVER GREATER THAN 100.4 F (38 C) OR HIGHER *CHILLS OR SWEATING *NAUSEA AND VOMITING THAT IS NOT CONTROLLED WITH YOUR NAUSEA MEDICATION *UNUSUAL SHORTNESS OF BREATH *UNUSUAL BRUISING OR BLEEDING *URINARY PROBLEMS (pain or burning when urinating, or frequent urination) *BOWEL PROBLEMS (unusual diarrhea, constipation, pain near the anus) TENDERNESS IN MOUTH AND THROAT WITH OR WITHOUT PRESENCE OF ULCERS (sore throat, sores in mouth, or a toothache) UNUSUAL RASH, SWELLING OR PAIN  UNUSUAL VAGINAL DISCHARGE OR ITCHING   Items with * indicate a potential emergency and should be followed up as soon as possible or go to the Emergency Department if any problems should occur.  Please show the CHEMOTHERAPY ALERT CARD or  IMMUNOTHERAPY ALERT CARD at check-in to the Emergency Department and triage nurse.  Should you have questions after your visit or need to cancel or reschedule your appointment, please contact [facility] - A DEPT OF [facility]  [phone] and follow the prompts.  Office hours are 8:00 a.m. to 4:30 p.m. Monday - Friday. Please note that voicemails left after 4:00 p.m. may not be returned until the following business day.  We are closed weekends and major holidays. You have access to a nurse at all times for urgent questions. Please call the main number to the clinic [phone] and follow the prompts.  For any non-urgent questions, you may also contact your provider using MyChart. We now offer e-Visits for anyone 24 and older to request care online for non-urgent symptoms. For details visit [email or web address].   Also download the MyChart app! Go to the app store, search "MyChart", open the app, select [place], and log in with your MyChart username and password.

## 2023-12-08 ENCOUNTER — Telehealth: Payer: Self-pay | Admitting: *Deleted

## 2023-12-08 NOTE — Telephone Encounter (Signed)
FMLA completed and faxed, copied for chart. Patient informed done and that a copy will be at front desk for her to pick up as well

## 2023-12-08 NOTE — Telephone Encounter (Signed)
 Received FMLA for patient, form completed and sent for doctor signature

## 2023-12-15 ENCOUNTER — Other Ambulatory Visit: Payer: Self-pay

## 2023-12-15 ENCOUNTER — Inpatient Hospital Stay (HOSPITAL_BASED_OUTPATIENT_CLINIC_OR_DEPARTMENT_OTHER): Payer: Commercial Managed Care - PPO | Admitting: Internal Medicine

## 2023-12-15 ENCOUNTER — Encounter: Payer: Self-pay | Admitting: Oncology

## 2023-12-15 ENCOUNTER — Encounter: Payer: Self-pay | Admitting: Internal Medicine

## 2023-12-15 ENCOUNTER — Inpatient Hospital Stay: Payer: Commercial Managed Care - PPO | Attending: Oncology

## 2023-12-15 ENCOUNTER — Inpatient Hospital Stay: Payer: Commercial Managed Care - PPO

## 2023-12-15 VITALS — BP 129/63 | HR 77 | Temp 97.8°F | Resp 16 | Wt 212.0 lb

## 2023-12-15 VITALS — BP 125/62 | HR 76

## 2023-12-15 DIAGNOSIS — Z171 Estrogen receptor negative status [ER-]: Secondary | ICD-10-CM | POA: Insufficient documentation

## 2023-12-15 DIAGNOSIS — C50312 Malignant neoplasm of lower-inner quadrant of left female breast: Secondary | ICD-10-CM | POA: Diagnosis present

## 2023-12-15 DIAGNOSIS — T451X5A Adverse effect of antineoplastic and immunosuppressive drugs, initial encounter: Secondary | ICD-10-CM | POA: Insufficient documentation

## 2023-12-15 DIAGNOSIS — D701 Agranulocytosis secondary to cancer chemotherapy: Secondary | ICD-10-CM | POA: Diagnosis not present

## 2023-12-15 DIAGNOSIS — Z79899 Other long term (current) drug therapy: Secondary | ICD-10-CM | POA: Insufficient documentation

## 2023-12-15 DIAGNOSIS — Z5111 Encounter for antineoplastic chemotherapy: Secondary | ICD-10-CM | POA: Diagnosis present

## 2023-12-15 DIAGNOSIS — Z1722 Progesterone receptor negative status: Secondary | ICD-10-CM | POA: Diagnosis not present

## 2023-12-15 DIAGNOSIS — G629 Polyneuropathy, unspecified: Secondary | ICD-10-CM | POA: Diagnosis not present

## 2023-12-15 DIAGNOSIS — Z1731 Human epidermal growth factor receptor 2 positive status: Secondary | ICD-10-CM | POA: Insufficient documentation

## 2023-12-15 DIAGNOSIS — Z5112 Encounter for antineoplastic immunotherapy: Secondary | ICD-10-CM | POA: Diagnosis present

## 2023-12-15 LAB — CMP (CANCER CENTER ONLY)
ALT: 34 U/L (ref 0–44)
AST: 32 U/L (ref 15–41)
Albumin: 3.6 g/dL (ref 3.5–5.0)
Alkaline Phosphatase: 86 U/L (ref 38–126)
Anion gap: 9 (ref 5–15)
BUN: 11 mg/dL (ref 8–23)
CO2: 23 mmol/L (ref 22–32)
Calcium: 8.9 mg/dL (ref 8.9–10.3)
Chloride: 106 mmol/L (ref 98–111)
Creatinine: 0.74 mg/dL (ref 0.44–1.00)
GFR, Estimated: >60 mL/min (ref >60)
Glucose, Bld: 273 mg/dL — ABNORMAL HIGH (ref 70–99)
Potassium: 4 mmol/L (ref 3.5–5.1)
Sodium: 138 mmol/L (ref 135–145)
Total Bilirubin: 0.4 mg/dL (ref 0.0–1.2)
Total Protein: 6.5 g/dL (ref 6.5–8.1)

## 2023-12-15 LAB — CBC WITH DIFFERENTIAL (CANCER CENTER ONLY)
Abs Immature Granulocytes: 0.01 10*3/uL (ref 0.00–0.07)
Basophils Absolute: 0 10*3/uL (ref 0.0–0.1)
Basophils Relative: 1 %
Eosinophils Absolute: 0.2 10*3/uL (ref 0.0–0.5)
Eosinophils Relative: 6 %
HCT: 35.7 % — ABNORMAL LOW (ref 36.0–46.0)
Hemoglobin: 11.9 g/dL — ABNORMAL LOW (ref 12.0–15.0)
Immature Granulocytes: 0 %
Lymphocytes Relative: 54 %
Lymphs Abs: 2.1 10*3/uL (ref 0.7–4.0)
MCH: 30.4 pg (ref 26.0–34.0)
MCHC: 33.3 g/dL (ref 30.0–36.0)
MCV: 91.3 fL (ref 80.0–100.0)
Monocytes Absolute: 0.4 10*3/uL (ref 0.1–1.0)
Monocytes Relative: 10 %
Neutro Abs: 1.2 10*3/uL — ABNORMAL LOW (ref 1.7–7.7)
Neutrophils Relative %: 29 %
Platelet Count: 308 10*3/uL (ref 150–400)
RBC: 3.91 MIL/uL (ref 3.87–5.11)
RDW: 13.1 % (ref 11.5–15.5)
WBC Count: 4 10*3/uL (ref 4.0–10.5)
nRBC: 0 % (ref 0.0–0.2)

## 2023-12-15 MED ORDER — SODIUM CHLORIDE 0.9 % IV SOLN
INTRAVENOUS | Status: DC
Start: 2023-12-15 — End: 2023-12-15
  Filled 2023-12-15 (×2): qty 250

## 2023-12-15 MED ORDER — PROCHLORPERAZINE MALEATE 10 MG PO TABS
10.0000 mg | ORAL_TABLET | Freq: Once | ORAL | Status: AC
Start: 2023-12-15 — End: 2023-12-15
  Administered 2023-12-15: 10 mg via ORAL
  Filled 2023-12-15: qty 1

## 2023-12-15 MED ORDER — PACLITAXEL PROTEIN-BOUND CHEMO INJECTION 100 MG
100.0000 mg/m2 | Freq: Once | INTRAVENOUS | Status: AC
  Administered 2023-12-15: 200 mg via INTRAVENOUS
  Filled 2023-12-15: qty 40

## 2023-12-15 MED ORDER — ACETAMINOPHEN 325 MG PO TABS
650.0000 mg | ORAL_TABLET | Freq: Once | ORAL | Status: AC
  Administered 2023-12-15: 650 mg via ORAL
  Filled 2023-12-15: qty 2

## 2023-12-15 MED ORDER — DIPHENHYDRAMINE HCL 50 MG/ML IJ SOLN
50.0000 mg | Freq: Once | INTRAMUSCULAR | Status: AC
  Administered 2023-12-15: 50 mg via INTRAVENOUS
  Filled 2023-12-15: qty 1

## 2023-12-15 MED ORDER — SODIUM CHLORIDE 0.9% FLUSH
10.0000 mL | INTRAVENOUS | Status: DC | PRN
  Administered 2023-12-15: 10 mL
  Filled 2023-12-15: qty 10

## 2023-12-15 MED ORDER — HEPARIN SOD (PORK) LOCK FLUSH 100 UNIT/ML IV SOLN
500.0000 [IU] | Freq: Once | INTRAVENOUS | Status: AC | PRN
  Administered 2023-12-15: 500 [IU]
  Filled 2023-12-15: qty 5

## 2023-12-15 MED ORDER — TRASTUZUMAB-ANNS CHEMO 150 MG IV SOLR
2.0000 mg/kg | Freq: Once | INTRAVENOUS | Status: AC
  Administered 2023-12-15: 189 mg via INTRAVENOUS
  Filled 2023-12-15: qty 9

## 2023-12-15 NOTE — Progress Notes (Addendum)
 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  Ultrasound of the left axilla demonstrated normal lymph nodes.  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  8 mm.  Tumor was ER/PR -0% and HER2 positive +3 by IHC.    Menarche at the age of 66.  She is G5, P5.  Age at first birth 55.  She has used birth control pills in the past.  She attained menopause in her 27s.  Family history significant for breast cancer in 2 sisters of her maternal grandmother.  Mom with cervical cancer in her 34s.  Paternal uncle with stomach cancer.  Her sister has been diagnosed with some form of skin cancer.  Left lumpectomy pathology from 11/04/2023 showed 10 mm grade 3 tumor with negative margins.  3 sentinel lymph nodes negative for malignancy.  Tumor was ER/PR negative and HER2 positive  Interval history- Patient seen today as follow-up prior to cycle 1 day 15 of Abraxane . Tolerating treatment well.  Patient had neuropathy in her bilateral feet prior to  starting chemotherapy.  Symptoms feel more pronounced overall manageable.  Energy level is good.  Appetite is good.  Mild nausea manageable with antiemetics.  Denies any issues with bowel movements.  ECOG PS- 0 Pain scale- 0   Review of systems- Review of Systems  Constitutional:  Negative for chills, fever, malaise/fatigue and weight loss.  HENT:  Negative for congestion, ear discharge and nosebleeds.   Eyes:  Negative for blurred vision.  Respiratory:  Negative for cough, hemoptysis, sputum production, shortness of breath and wheezing.   Cardiovascular:  Negative for chest pain, palpitations, orthopnea and claudication.  Gastrointestinal:  Negative for abdominal pain, blood in stool, constipation, diarrhea, heartburn, melena, nausea and vomiting.  Genitourinary:  Negative for dysuria, flank pain, frequency, hematuria and urgency.  Musculoskeletal:  Negative for back pain, joint pain and myalgias.  Skin:  Negative for rash.  Neurological:  Negative for dizziness, tingling, focal weakness, seizures, weakness and headaches.  Endo/Heme/Allergies:  Does not bruise/bleed easily.  Psychiatric/Behavioral:  Negative for depression and suicidal ideas. The patient does not have insomnia.       Allergies  Allergen Reactions   Paclitaxel  Shortness Of Breath, Other (See Comments) and Hypertension    Redness to face/chest Back Pain     Flagyl [Metronidazole] Itching     Past Medical History:  Diagnosis Date   Adenoma of appendix    Anemia    Chronic lower back pain    GERD (gastroesophageal reflux disease)    H/O gestational diabetes mellitus, not currently pregnant    History of kidney stones  Hypercholesteremia    Hyperplastic rectal polyp    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, estrogen receptor negative (HCC)    Obesity    Ovarian cyst    Palpitations    Sigmoid diverticulitis 08/2012   Subclinical hypothyroidism    SVT (supraventricular tachycardia)  (HCC)    on Holter monitor 5 beat run of svt   Thyroid  nodule    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   Type 2 diabetes mellitus (HCC)      Past Surgical History:  Procedure Laterality Date   AXILLARY SENTINEL NODE BIOPSY Left 11/04/2023   Procedure: AXILLARY SENTINEL NODE BIOPSY;  Surgeon: Lane Shope, MD;  Location: [facility] ORS;  Service: General;  Laterality: Left;   BLADDER SUSPENSION  2009   BREAST BIOPSY Left 10/22/2023   Us  Core Bx, heart clip - path pending   BREAST BIOPSY Left 10/22/2023   US  LT BREAST BX W LOC DEV 1ST LESION IMG BX SPEC US  GUIDE 10/22/2023 [facility]   BREAST LUMPECTOMY WITH RADIOFREQUENCY TAG IDENTIFICATION Left 11/04/2023   Procedure: BREAST LUMPECTOMY WITH RADIOFREQUENCY TAG IDENTIFICATION;  Surgeon: Lane Shope, MD;  Location: [facility] ORS;  Service: General;  Laterality: Left;   Colectomy Left    likely sigmoid, open.  for diverticulitis.   COLONOSCOPY WITH PROPOFOL  N/A 10/01/2022   Procedure: COLONOSCOPY WITH PROPOFOL ;  Surgeon: Unk Corinn Skiff, MD;  Location: [facility];  Service: Gastroenterology;  Laterality: N/A;   PORTACATH PLACEMENT Right 11/04/2023   Procedure: INSERTION PORT-A-CATH;  Surgeon: Lane Shope, MD;  Location: [facility] ORS;  Service: General;  Laterality: Right;   TUBAL LIGATION     XI ROBOTIC LAPAROSCOPIC ASSISTED APPENDECTOMY N/A 12/29/2022   Procedure: XI ROBOTIC LYSIS OF ADHESIONS AND APPENDECTOMY;  Surgeon: Lane Shope, MD;  Location: [facility] ORS;  Service: General;  Laterality: N/A;    Social History   Socioeconomic History   Marital status: Single    Spouse name: Not on file   Number of children: Not on file   Years of education: Not on file   Highest education level: Some college, no degree  Occupational History   Not on file  Tobacco Use   Smoking status: Former    Current packs/day: 0.00    Types: Cigarettes, Cigars    Quit date: 09/19/2011    Years since quitting: 12.2   Smokeless tobacco: Never   Vaping Use   Vaping status: Former  Substance and Sexual Activity   Alcohol use: Not Currently    Comment: Occasional glass of wine   Drug use: No   Sexual activity: Not Currently  Other Topics Concern   Not on file  Social History Narrative   Works in audiological scientist at [employer].   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   Social Drivers of Corporate Investment Banker Strain: Low Risk  (06/17/2023)   Overall Financial Resource Strain (CARDIA)    Difficulty of Paying Living Expenses: Not very hard  Food Insecurity: No Food Insecurity (10/28/2023)   Hunger Vital Sign    Worried About Running Out of Food in the Last Year: Never true    Ran Out of Food in the Last Year: Never true  Transportation Needs: No Transportation Needs (10/28/2023)   PRAPARE - Administrator, Civil Service (Medical): No    Lack of Transportation (Non-Medical): No  Physical Activity: Sufficiently Active (06/17/2023)   Exercise Vital Sign    Days of Exercise per Week: 7 days    Minutes  of Exercise per Session: 60 min  Stress: Stress Concern Present (06/17/2023)   [employer] of Occupational Health - Occupational Stress Questionnaire    Feeling of Stress : To some extent  Social Connections: Moderately Integrated (06/17/2023)   Social Connection and Isolation Panel [NHANES]    Frequency of Communication with Friends and Family: More than three times a week    Frequency of Social Gatherings with Friends and Family: Once a week    Attends Religious Services: More than 4 times per year    Active Member of [employer] or Organizations: Yes    Attends Banker Meetings: More than 4 times per year    Marital Status: Divorced  Intimate Partner Violence: Not At Risk (10/28/2023)   Humiliation, Afraid, Rape, and Kick questionnaire    Fear of Current or Ex-Partner: No    Emotionally Abused: No    Physically Abused: No    Sexually Abused: No    Family History  Problem Relation Age  of Onset   Cancer - Cervical Mother        dx 77s; Passed away from Kidney Issues (Not Cancer)   Heart disease Father    Kidney disease Father    Diabetes Father    Skin cancer Sister    Stomach cancer Paternal Uncle    Diabetes Maternal Grandmother    Skin cancer Maternal Grandfather    Diabetes Paternal Grandmother    Breast cancer Other        mat great aunts x2; unk age of dx   Lung cancer Cousin    Brain cancer Cousin      Current Outpatient Medications:    acetaminophen  (TYLENOL ) 500 MG tablet, Take 500 mg by mouth every 6 (six) hours as needed., Disp: , Rfl:    atenolol  (TENORMIN ) 25 MG tablet, TAKE 1 TABLET (25 MG TOTAL) BY MOUTH DAILY. FOR PALPITATIONS. (Patient taking differently: Take 25 mg by mouth at bedtime. For palpitations.), Disp: 90 tablet, Rfl: 2   Biotin 1000 MCG tablet, Take 1,000 mcg by mouth daily., Disp: , Rfl:    blood glucose meter kit and supplies KIT, Dispense based on patient and insurance preference. Use up to four times daily as directed. (FOR ICD-9 250.00, 250.01)., Disp: 1 each, Rfl: 0   cyanocobalamin 500 MCG tablet, Take 1,000 mcg by mouth daily. Reported on 06/12/2016, Disp: , Rfl:    docusate sodium (COLACE) 100 MG capsule, Take 200 mg by mouth daily as needed for moderate constipation. Reported on 06/12/2016, Disp: , Rfl:    ibuprofen  (ADVIL ) 800 MG tablet, Take 1 tablet (800 mg total) by mouth every 8 (eight) hours as needed., Disp: 30 tablet, Rfl: 0   lidocaine -prilocaine  (EMLA ) cream, Apply to affected area once, Disp: 30 g, Rfl: 3   magnesium 30 MG tablet, Take 30 mg by mouth daily as needed., Disp: , Rfl:    metFORMIN  (GLUCOPHAGE -XR) 500 MG 24 hr tablet, TAKE 1 TABLET (500 MG TOTAL) BY MOUTH DAILY WITH BREAKFAST. FOR DIABETES., Disp: 90 tablet, Rfl: 0   ondansetron  (ZOFRAN ) 8 MG tablet, Take 1 tablet (8 mg total) by mouth every 8 (eight) hours as needed for nausea or vomiting., Disp: 30 tablet, Rfl: 1   prochlorperazine  (COMPAZINE ) 10 MG tablet,  Take 1 tablet (10 mg total) by mouth every 6 (six) hours as needed for nausea or vomiting., Disp: 30 tablet, Rfl: 1   pyridOXINE (VITAMIN B6) 25 MG tablet, Take 25 mg by mouth daily., Disp: , Rfl:  rosuvastatin  (CRESTOR ) 5 MG tablet, TAKE 1 TABLET (5 MG TOTAL) BY MOUTH EVERY EVENING. FOR CHOLESTEROL., Disp: 90 tablet, Rfl: 2 No current facility-administered medications for this visit.  Facility-Administered Medications Ordered in Other Visits:    0.9 %  sodium chloride  infusion, , Intravenous, Continuous, Melanee Annah BROCKS, MD, Stopped at 11/27/23 1158   0.9 %  sodium chloride  infusion, , Intravenous, Continuous, Melanee Annah BROCKS, MD, Last Rate: 10 mL/hr at 12/15/23 1133, New Bag at 12/15/23 1133   heparin  lock flush 100 unit/mL, 500 Units, Intracatheter, Once PRN, Rao, Archana C, MD   PACLitaxel -protein bound (ABRAXANE ) chemo infusion 200 mg, 100 mg/m2 (Treatment Plan Recorded), Intravenous, Once, Melanee Annah BROCKS, MD   sodium chloride  flush (NS) 0.9 % injection 10 mL, 10 mL, Intracatheter, PRN, Melanee Annah BROCKS, MD   trastuzumab -anns (KANJINTI ) 189 mg in sodium chloride  0.9 % 250 mL chemo infusion, 2 mg/kg (Treatment Plan Recorded), Intravenous, Once, Melanee Annah BROCKS, MD  Physical exam:  Vitals:   12/15/23 1050  BP: 129/63  Pulse: 77  Resp: 16  Temp: 97.8 F (36.6 C)  TempSrc: Tympanic  SpO2: 98%  Weight: 212 lb (96.2 kg)   Physical Exam Cardiovascular:     Rate and Rhythm: Normal rate and regular rhythm.     Heart sounds: Normal heart sounds.  Pulmonary:     Effort: Pulmonary effort is normal.     Breath sounds: Normal breath sounds.  Abdominal:     General: Bowel sounds are normal.     Palpations: Abdomen is soft.  Skin:    General: Skin is warm and dry.  Neurological:     Mental Status: She is alert and oriented to person, place, and time.         Latest Ref Rng & Units 12/15/2023   10:26 AM  CMP  Glucose 70 - 99 mg/dL 726   BUN 8 - 23 mg/dL 11   Creatinine 9.55 - 1.00  mg/dL 9.25   Sodium 864 - 854 mmol/L 138   Potassium 3.5 - 5.1 mmol/L 4.0   Chloride 98 - 111 mmol/L 106   CO2 22 - 32 mmol/L 23   Calcium  8.9 - 10.3 mg/dL 8.9   Total Protein 6.5 - 8.1 g/dL 6.5   Total Bilirubin 0.0 - 1.2 mg/dL 0.4   Alkaline Phos 38 - 126 U/L 86   AST 15 - 41 U/L 32   ALT 0 - 44 U/L 34       Latest Ref Rng & Units 12/15/2023   10:26 AM  CBC  WBC 4.0 - 10.5 K/uL 4.0   Hemoglobin 12.0 - 15.0 g/dL 88.0   Hematocrit 63.9 - 46.0 % 35.7   Platelets 150 - 400 K/uL 308       ECHOCARDIOGRAM COMPLETE Result Date: 11/19/2023    ECHOCARDIOGRAM REPORT   Patient Name:   SHAMYIA GRANDPRE Covault Date of Exam: 11/19/2023 Medical Rec #:  [id number]       Height:       65.0 in Accession #:    [id number]      Weight:       209.8 lb Date of Birth:  [birth year]       BSA:          2.019 m Patient Age:    62 years        BP:           148/79 mmHg Patient Gender: F  HR:           97 bpm. Exam Location:  [facility] Procedure: 2D Echo, Cardiac Doppler, Color Doppler and Strain Analysis Indications:     Chemo Z09  History:         Patient has no prior history of Echocardiogram examinations.                  Risk Factors:Diabetes. Palpitations, SVT.  Sonographer:     Christopher Furnace Referring Phys:  [id number] ANNAH BROCKS RAO Diagnosing Phys: Evalene Lunger MD  Sonographer Comments: Global longitudinal strain was attempted. IMPRESSIONS  1. Left ventricular ejection fraction, by estimation, is 60 to 65%. The left ventricle has normal function. The left ventricle has no regional wall motion abnormalities. There is mild left ventricular hypertrophy. Left ventricular diastolic parameters are consistent with Grade I diastolic dysfunction (impaired relaxation). The average left ventricular global longitudinal strain is -16.7 %.  2. Right ventricular systolic function is normal. The right ventricular size is normal. There is normal pulmonary artery systolic pressure. The estimated right ventricular systolic pressure is  29.8 mmHg.  3. The mitral valve is normal in structure. Mild mitral valve regurgitation. No evidence of mitral stenosis.  4. The aortic valve has an indeterminant number of cusps. Aortic valve regurgitation is not visualized. No aortic stenosis is present.  5. The inferior vena cava is normal in size with greater than 50% respiratory variability, suggesting right atrial pressure of 3 mmHg. FINDINGS  Left Ventricle: Left ventricular ejection fraction, by estimation, is 60 to 65%. The left ventricle has normal function. The left ventricle has no regional wall motion abnormalities. The average left ventricular global longitudinal strain is -16.7 %. The left ventricular internal cavity size was normal in size. There is mild left ventricular hypertrophy. Left ventricular diastolic parameters are consistent with Grade I diastolic dysfunction (impaired relaxation). Right Ventricle: The right ventricular size is normal. No increase in right ventricular wall thickness. Right ventricular systolic function is normal. There is normal pulmonary artery systolic pressure. The tricuspid regurgitant velocity is 2.49 m/s, and  with an assumed right atrial pressure of 5 mmHg, the estimated right ventricular systolic pressure is 29.8 mmHg. Left Atrium: Left atrial size was normal in size. Right Atrium: Right atrial size was normal in size. Pericardium: There is no evidence of pericardial effusion. Mitral Valve: The mitral valve is normal in structure. Mild mitral annular calcification. Mild mitral valve regurgitation. No evidence of mitral valve stenosis. MV peak gradient, 6.2 mmHg. The mean mitral valve gradient is 4.0 mmHg. Tricuspid Valve: The tricuspid valve is normal in structure. Tricuspid valve regurgitation is mild . No evidence of tricuspid stenosis. Aortic Valve: The aortic valve has an indeterminant number of cusps. Aortic valve regurgitation is not visualized. No aortic stenosis is present. Aortic valve mean gradient  measures 5.0 mmHg. Aortic valve peak gradient measures 7.4 mmHg. Aortic valve area, by VTI measures 2.28 cm. Pulmonic Valve: The pulmonic valve was normal in structure. Pulmonic valve regurgitation is not visualized. No evidence of pulmonic stenosis. Aorta: The aortic root is normal in size and structure. Venous: The inferior vena cava is normal in size with greater than 50% respiratory variability, suggesting right atrial pressure of 3 mmHg. IAS/Shunts: No atrial level shunt detected by color flow Doppler.  LEFT VENTRICLE PLAX 2D LVIDd:         3.90 cm   Diastology LVIDs:         2.80 cm   LV e' medial:  8.81 cm/s LV PW:         1.10 cm   LV E/e' medial:  11.9 LV IVS:        1.10 cm   LV e' lateral:   8.05 cm/s LVOT diam:     2.00 cm   LV E/e' lateral: 13.0 LV SV:         66 LV SV Index:   33        2D Longitudinal Strain LVOT Area:     3.14 cm  2D Strain GLS Avg:     -16.7 %  RIGHT VENTRICLE RV Basal diam:  3.70 cm RV Mid diam:    2.70 cm RV S prime:     17.80 cm/s TAPSE (M-mode): 2.7 cm LEFT ATRIUM             Index        RIGHT ATRIUM           Index LA diam:        3.30 cm 1.63 cm/m   RA Area:     12.10 cm LA Vol (A2C):   33.2 ml 16.45 ml/m  RA Volume:   25.30 ml  12.53 ml/m LA Vol (A4C):   28.9 ml 14.32 ml/m LA Biplane Vol: 31.7 ml 15.70 ml/m  AORTIC VALVE AV Area (Vmax):    2.10 cm AV Area (Vmean):   1.86 cm AV Area (VTI):     2.28 cm AV Vmax:           136.00 cm/s AV Vmean:          103.450 cm/s AV VTI:            0.290 m AV Peak Grad:      7.4 mmHg AV Mean Grad:      5.0 mmHg LVOT Vmax:         91.00 cm/s LVOT Vmean:        61.300 cm/s LVOT VTI:          0.210 m LVOT/AV VTI ratio: 0.73  AORTA Ao Root diam: 2.50 cm MITRAL VALVE                TRICUSPID VALVE MV Area (PHT): 4.36 cm     TR Peak grad:   24.8 mmHg MV Area VTI:   2.33 cm     TR Vmax:        249.00 cm/s MV Peak grad:  6.2 mmHg MV Mean grad:  4.0 mmHg     SHUNTS MV Vmax:       1.24 m/s     Systemic VTI:  0.21 m MV Vmean:      90.9  cm/s    Systemic Diam: 2.00 cm MV Decel Time: 174 msec MV E velocity: 105.00 cm/s MV A velocity: 123.00 cm/s MV E/A ratio:  0.85 Evalene Lunger MD Electronically signed by Evalene Lunger MD Signature Date/Time: 11/19/2023/4:24:59 PM    Final      Assessment and plan- Patient is a 63 y.o. female with history of pathological prognostic stage Ia invasive mammary carcinoma of the left breast pT1b N0 M0 grade 3 ER/PR negative and HER2 positive discussed the pathology results and further management  # Left breast invasive mammary carcinoma, stage Ia, ER/PR negative HER2 positive. -She started on Taxol  Herceptin  based on APT trial on 11/27/2023.  She had reaction to paclitaxel .  With day 8 it was switched to Abraxane .  She is here for cycle 1 day 15  of Abraxane  and Herceptin .  Labs reviewed.  ANC of 1.2 noted.  Will proceed with chemotherapy as planned.  I have reached out to authorization staff for approval of Zarzio.  She is at risk of worsening neutropenia and further delays in chemotherapy.  So she will benefit from G-CSF support.  Will schedule her for 3 days starting tomorrow.  I advised her to add Claritin over-the-counter for 5 days.  # Chemotherapy-induced neutropenia -As above  # Neuropathy -In bilateral feet prior to chemotherapy.  Now more pronounced with chemo. -Continue to monitor.  If worsens may need to add gabapentin .  # Diabetes-elevated glucose.  Continue follow-up with PCP.  RTC as scheduled   Cancer Staging  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 in female, estrogen receptor negative (HCC) Staging form: Breast, AJCC 8th Edition - Clinical stage from 10/28/2023: Stage IA (cT1b, cN0, cM0, G2, ER-, PR-, HER2+) - Signed by Melanee Annah BROCKS, MD on 10/28/2023 Histologic grading system: 3 grade system - Pathologic stage from 11/20/2023: Stage IA (pT1b, pN0, cM0, G3, ER-, PR-, HER2+) - Signed by Melanee Annah BROCKS, MD on 11/20/2023 Stage prefix: Initial  diagnosis Multigene prognostic tests performed: None Histologic grading system: 3 grade system     Visit Diagnosis 1. 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 in female, estrogen receptor negative (HCC)   2. Chemotherapy induced neutropenia (HCC)   3. Neuropathy   4. Encounter for antineoplastic chemotherapy

## 2023-12-15 NOTE — Patient Instructions (Signed)

## 2023-12-15 NOTE — Progress Notes (Signed)
 Patient states that her left foot is constantly feeling asleep, which was happening before starting treatment, but she has noticed more now. She would like to discuss maybe taking gabapentin  for the neuropathy. Heart burn and gas has been an issue more recently, she wants to know what the best thing to take for the gas and heart burn.

## 2023-12-15 NOTE — Patient Instructions (Signed)
 Please start Claritin 10 mg once daily tomorrow (12/15/2022) for 5 days. Over the counter.

## 2023-12-16 ENCOUNTER — Inpatient Hospital Stay: Payer: Commercial Managed Care - PPO

## 2023-12-16 DIAGNOSIS — Z5112 Encounter for antineoplastic immunotherapy: Secondary | ICD-10-CM | POA: Diagnosis not present

## 2023-12-16 DIAGNOSIS — C50312 Malignant neoplasm of lower-inner quadrant of left female breast: Secondary | ICD-10-CM

## 2023-12-16 MED ORDER — FILGRASTIM-SNDZ 480 MCG/0.8ML IJ SOSY
480.0000 ug | PREFILLED_SYRINGE | Freq: Once | INTRAMUSCULAR | Status: AC
  Administered 2023-12-16: 480 ug via SUBCUTANEOUS
  Filled 2023-12-16: qty 0.8

## 2023-12-17 ENCOUNTER — Telehealth: Payer: Self-pay | Admitting: *Deleted

## 2023-12-17 ENCOUNTER — Inpatient Hospital Stay: Payer: Commercial Managed Care - PPO

## 2023-12-17 ENCOUNTER — Encounter: Payer: Self-pay | Admitting: Oncology

## 2023-12-17 ENCOUNTER — Other Ambulatory Visit: Payer: Self-pay | Admitting: Oncology

## 2023-12-17 DIAGNOSIS — J019 Acute sinusitis, unspecified: Secondary | ICD-10-CM

## 2023-12-17 DIAGNOSIS — C50312 Malignant neoplasm of lower-inner quadrant of left female breast: Secondary | ICD-10-CM

## 2023-12-17 DIAGNOSIS — Z5112 Encounter for antineoplastic immunotherapy: Secondary | ICD-10-CM | POA: Diagnosis not present

## 2023-12-17 MED ORDER — AZITHROMYCIN 250 MG PO TABS: ORAL_TABLET | ORAL | 0 refills | Status: DC

## 2023-12-17 MED ORDER — FILGRASTIM-SNDZ 480 MCG/0.8ML IJ SOSY
480.0000 ug | PREFILLED_SYRINGE | Freq: Once | INTRAMUSCULAR | Status: AC
  Administered 2023-12-17: 480 ug via SUBCUTANEOUS
  Filled 2023-12-17: qty 0.8

## 2023-12-17 NOTE — Telephone Encounter (Signed)
 RN spoke with Dr. Alena Bills, who recommended a zpac for patient's sinusitis concerns. Msg sent to patient via mychart response. Script sent to pharmacy

## 2023-12-18 ENCOUNTER — Inpatient Hospital Stay: Payer: Commercial Managed Care - PPO

## 2023-12-18 VITALS — BP 138/86 | HR 87

## 2023-12-18 DIAGNOSIS — C50312 Malignant neoplasm of lower-inner quadrant of left female breast: Secondary | ICD-10-CM

## 2023-12-18 DIAGNOSIS — Z5112 Encounter for antineoplastic immunotherapy: Secondary | ICD-10-CM | POA: Diagnosis not present

## 2023-12-18 MED ORDER — FILGRASTIM-SNDZ 480 MCG/0.8ML IJ SOSY
480.0000 ug | PREFILLED_SYRINGE | Freq: Once | INTRAMUSCULAR | Status: AC
  Administered 2023-12-18: 480 ug via SUBCUTANEOUS
  Filled 2023-12-18: qty 0.8

## 2023-12-22 ENCOUNTER — Inpatient Hospital Stay: Payer: Commercial Managed Care - PPO

## 2023-12-22 ENCOUNTER — Other Ambulatory Visit: Payer: Commercial Managed Care - PPO

## 2023-12-22 ENCOUNTER — Ambulatory Visit: Payer: Commercial Managed Care - PPO

## 2023-12-22 ENCOUNTER — Other Ambulatory Visit: Payer: Self-pay

## 2023-12-22 ENCOUNTER — Encounter: Payer: Self-pay | Admitting: Oncology

## 2023-12-22 VITALS — BP 119/62 | HR 82 | Temp 98.1°F | Resp 18

## 2023-12-22 DIAGNOSIS — C50312 Malignant neoplasm of lower-inner quadrant of left female breast: Secondary | ICD-10-CM

## 2023-12-22 DIAGNOSIS — Z5112 Encounter for antineoplastic immunotherapy: Secondary | ICD-10-CM | POA: Diagnosis not present

## 2023-12-22 LAB — CMP (CANCER CENTER ONLY)
ALT: 36 U/L (ref 0–44)
AST: 41 U/L (ref 15–41)
Albumin: 3.9 g/dL (ref 3.5–5.0)
Alkaline Phosphatase: 89 U/L (ref 38–126)
Anion gap: 10 (ref 5–15)
BUN: 11 mg/dL (ref 8–23)
CO2: 24 mmol/L (ref 22–32)
Calcium: 8.8 mg/dL — ABNORMAL LOW (ref 8.9–10.3)
Chloride: 102 mmol/L (ref 98–111)
Creatinine: 0.79 mg/dL (ref 0.44–1.00)
GFR, Estimated: >60 mL/min (ref >60)
Glucose, Bld: 250 mg/dL — ABNORMAL HIGH (ref 70–99)
Potassium: 3.9 mmol/L (ref 3.5–5.1)
Sodium: 136 mmol/L (ref 135–145)
Total Bilirubin: 0.5 mg/dL (ref 0.0–1.2)
Total Protein: 7.1 g/dL (ref 6.5–8.1)

## 2023-12-22 LAB — CBC WITH DIFFERENTIAL (CANCER CENTER ONLY)
Abs Immature Granulocytes: 1.78 10*3/uL — ABNORMAL HIGH (ref 0.00–0.07)
Basophils Absolute: 0.1 10*3/uL (ref 0.0–0.1)
Basophils Relative: 1 %
Eosinophils Absolute: 0.2 10*3/uL (ref 0.0–0.5)
Eosinophils Relative: 2 %
HCT: 38.5 % (ref 36.0–46.0)
Hemoglobin: 12.8 g/dL (ref 12.0–15.0)
Immature Granulocytes: 21 %
Lymphocytes Relative: 41 %
Lymphs Abs: 3.5 10*3/uL (ref 0.7–4.0)
MCH: 29.8 pg (ref 26.0–34.0)
MCHC: 33.2 g/dL (ref 30.0–36.0)
MCV: 89.7 fL (ref 80.0–100.0)
Monocytes Absolute: 0.6 10*3/uL (ref 0.1–1.0)
Monocytes Relative: 7 %
Neutro Abs: 2.4 10*3/uL (ref 1.7–7.7)
Neutrophils Relative %: 28 %
Platelet Count: 307 10*3/uL (ref 150–400)
RBC: 4.29 MIL/uL (ref 3.87–5.11)
RDW: 12.6 % (ref 11.5–15.5)
Smear Review: NORMAL
WBC Count: 8.5 10*3/uL (ref 4.0–10.5)
nRBC: 0 % (ref 0.0–0.2)

## 2023-12-22 MED ORDER — DIPHENHYDRAMINE HCL 50 MG/ML IJ SOLN
50.0000 mg | Freq: Once | INTRAMUSCULAR | Status: AC
  Administered 2023-12-22: 50 mg via INTRAVENOUS
  Filled 2023-12-22: qty 1

## 2023-12-22 MED ORDER — HEPARIN SOD (PORK) LOCK FLUSH 100 UNIT/ML IV SOLN
500.0000 [IU] | Freq: Once | INTRAVENOUS | Status: AC | PRN
  Administered 2023-12-22: 500 [IU]
  Filled 2023-12-22: qty 5

## 2023-12-22 MED ORDER — SODIUM CHLORIDE 0.9% FLUSH
10.0000 mL | INTRAVENOUS | Status: DC | PRN
  Administered 2023-12-22: 10 mL
  Filled 2023-12-22: qty 10

## 2023-12-22 MED ORDER — ACETAMINOPHEN 325 MG PO TABS
650.0000 mg | ORAL_TABLET | Freq: Once | ORAL | Status: AC
  Administered 2023-12-22: 650 mg via ORAL
  Filled 2023-12-22: qty 2

## 2023-12-22 MED ORDER — PROCHLORPERAZINE MALEATE 10 MG PO TABS
10.0000 mg | ORAL_TABLET | Freq: Once | ORAL | Status: AC
  Administered 2023-12-22: 10 mg via ORAL
  Filled 2023-12-22: qty 1

## 2023-12-22 MED ORDER — SODIUM CHLORIDE 0.9 % IV SOLN
2.0000 mg/kg | Freq: Once | INTRAVENOUS | Status: AC
  Administered 2023-12-22: 189 mg via INTRAVENOUS
  Filled 2023-12-22: qty 9

## 2023-12-22 MED ORDER — SODIUM CHLORIDE 0.9 % IV SOLN
INTRAVENOUS | Status: DC
  Filled 2023-12-22: qty 250

## 2023-12-22 MED ORDER — PACLITAXEL PROTEIN-BOUND CHEMO INJECTION 100 MG
100.0000 mg/m2 | Freq: Once | INTRAVENOUS | Status: AC
Start: 2023-12-22 — End: 2023-12-22
  Administered 2023-12-22: 200 mg via INTRAVENOUS
  Filled 2023-12-22: qty 40

## 2023-12-22 NOTE — Progress Notes (Signed)
 Nutrition Assessment   Reason for Assessment:  Receiving chemotherapy    ASSESSMENT:   63 year old female with stage I left breast cancer ER/PR negative, Her2 +.  Past medical history of DM, HLD, appendectomy, lumpectomy on 11/04/23.  Patient receiving abraxane  and trastuzumab -anns (kanjinti ).  Met with patient during infusion.  Reports that her appetite is good.  Eating 3 meals a day.  Reports some issues with heartburn.  Taking pepcid .     Medications: prochloroperazine, metformin , ondansetron , vit B 12, colace, Magnesium, Vit B 6   Labs: glucose 250   Anthropometrics:   Height: 65 inches Weight: 212 lb on 1/14 UBW: 200 lb per patient BMI: 35    NUTRITION DIAGNOSIS:  Food and nutrition related knowledge deficit related to cancer diagnosis as evidenced by education regarding nutrition and cancer   INTERVENTION:  Discussed AICR guidelines with patient.  Handout on Nutrition for Breast Cancer Survivor provided Contact information provided    Next Visit: none at this time RD available if needed  Dyllan Kats B. Dasie, RD, LDN Registered Dietitian [phone]

## 2023-12-22 NOTE — Patient Instructions (Signed)
 [facility] - A DEPT OF [facility]  Discharge Instructions: Thank you for choosing [facility] to provide your oncology and hematology care.  If you have a lab appointment with the Cancer Center, please go directly to the Cancer Center and check in at the registration area.  Wear comfortable clothing and clothing appropriate for easy access to any Portacath or PICC line.   We strive to give you quality time with your provider. You may need to reschedule your appointment if you arrive late (15 or more minutes).  Arriving late affects you and other patients whose appointments are after yours.  Also, if you miss three or more appointments without notifying the office, you may be dismissed from the clinic at the provider's discretion.      For prescription refill requests, have your pharmacy contact our office and allow 72 hours for refills to be completed.    Today you received the following chemotherapy and/or immunotherapy agents- trastuzumab, abraxane      To help prevent nausea and vomiting after your treatment, we encourage you to take your nausea medication as directed.  BELOW ARE SYMPTOMS THAT SHOULD BE REPORTED IMMEDIATELY: *FEVER GREATER THAN 100.4 F (38 C) OR HIGHER *CHILLS OR SWEATING *NAUSEA AND VOMITING THAT IS NOT CONTROLLED WITH YOUR NAUSEA MEDICATION *UNUSUAL SHORTNESS OF BREATH *UNUSUAL BRUISING OR BLEEDING *URINARY PROBLEMS (pain or burning when urinating, or frequent urination) *BOWEL PROBLEMS (unusual diarrhea, constipation, pain near the anus) TENDERNESS IN MOUTH AND THROAT WITH OR WITHOUT PRESENCE OF ULCERS (sore throat, sores in mouth, or a toothache) UNUSUAL RASH, SWELLING OR PAIN  UNUSUAL VAGINAL DISCHARGE OR ITCHING   Items with * indicate a potential emergency and should be followed up as soon as possible or go to the Emergency Department if any problems should occur.  Please show the CHEMOTHERAPY ALERT CARD or  IMMUNOTHERAPY ALERT CARD at check-in to the Emergency Department and triage nurse.  Should you have questions after your visit or need to cancel or reschedule your appointment, please contact [facility] - A DEPT OF [facility]  [phone] and follow the prompts.  Office hours are 8:00 a.m. to 4:30 p.m. Monday - Friday. Please note that voicemails left after 4:00 p.m. may not be returned until the following business day.  We are closed weekends and major holidays. You have access to a nurse at all times for urgent questions. Please call the main number to the clinic [phone] and follow the prompts.  For any non-urgent questions, you may also contact your provider using MyChart. We now offer e-Visits for anyone 24 and older to request care online for non-urgent symptoms. For details visit [email or web address].   Also download the MyChart app! Go to the app store, search "MyChart", open the app, select [place], and log in with your MyChart username and password.

## 2023-12-27 ENCOUNTER — Encounter: Payer: Self-pay | Admitting: Oncology

## 2023-12-29 ENCOUNTER — Inpatient Hospital Stay: Payer: Commercial Managed Care - PPO

## 2023-12-29 ENCOUNTER — Encounter: Payer: Self-pay | Admitting: Oncology

## 2023-12-29 ENCOUNTER — Inpatient Hospital Stay (HOSPITAL_BASED_OUTPATIENT_CLINIC_OR_DEPARTMENT_OTHER): Payer: Commercial Managed Care - PPO | Admitting: Oncology

## 2023-12-29 VITALS — BP 139/61 | HR 91 | Temp 97.7°F | Resp 18 | Wt 216.2 lb

## 2023-12-29 DIAGNOSIS — T451X5A Adverse effect of antineoplastic and immunosuppressive drugs, initial encounter: Secondary | ICD-10-CM

## 2023-12-29 DIAGNOSIS — Z5112 Encounter for antineoplastic immunotherapy: Secondary | ICD-10-CM | POA: Diagnosis not present

## 2023-12-29 DIAGNOSIS — C50312 Malignant neoplasm of lower-inner quadrant of left female breast: Secondary | ICD-10-CM

## 2023-12-29 DIAGNOSIS — Z171 Estrogen receptor negative status [ER-]: Secondary | ICD-10-CM | POA: Diagnosis not present

## 2023-12-29 DIAGNOSIS — D701 Agranulocytosis secondary to cancer chemotherapy: Secondary | ICD-10-CM | POA: Diagnosis not present

## 2023-12-29 DIAGNOSIS — Z5111 Encounter for antineoplastic chemotherapy: Secondary | ICD-10-CM

## 2023-12-29 LAB — CBC WITH DIFFERENTIAL (CANCER CENTER ONLY)
Abs Immature Granulocytes: 0.04 10*3/uL (ref 0.00–0.07)
Basophils Absolute: 0.1 10*3/uL (ref 0.0–0.1)
Basophils Relative: 2 %
Eosinophils Absolute: 0.1 10*3/uL (ref 0.0–0.5)
Eosinophils Relative: 2 %
HCT: 30.8 % — ABNORMAL LOW (ref 36.0–46.0)
Hemoglobin: 10.4 g/dL — ABNORMAL LOW (ref 12.0–15.0)
Immature Granulocytes: 1 %
Lymphocytes Relative: 63 %
Lymphs Abs: 2.1 10*3/uL (ref 0.7–4.0)
MCH: 30 pg (ref 26.0–34.0)
MCHC: 33.8 g/dL (ref 30.0–36.0)
MCV: 88.8 fL (ref 80.0–100.0)
Monocytes Absolute: 0.2 10*3/uL (ref 0.1–1.0)
Monocytes Relative: 7 %
Neutro Abs: 0.8 10*3/uL — ABNORMAL LOW (ref 1.7–7.7)
Neutrophils Relative %: 25 %
Platelet Count: 292 10*3/uL (ref 150–400)
RBC: 3.47 MIL/uL — ABNORMAL LOW (ref 3.87–5.11)
RDW: 12.7 % (ref 11.5–15.5)
WBC Count: 3.3 10*3/uL — ABNORMAL LOW (ref 4.0–10.5)
nRBC: 0 % (ref 0.0–0.2)

## 2023-12-29 LAB — CMP (CANCER CENTER ONLY)
ALT: 43 U/L (ref 0–44)
AST: 56 U/L — ABNORMAL HIGH (ref 15–41)
Albumin: 3.5 g/dL (ref 3.5–5.0)
Alkaline Phosphatase: 65 U/L (ref 38–126)
Anion gap: 10 (ref 5–15)
BUN: 11 mg/dL (ref 8–23)
CO2: 24 mmol/L (ref 22–32)
Calcium: 8.5 mg/dL — ABNORMAL LOW (ref 8.9–10.3)
Chloride: 105 mmol/L (ref 98–111)
Creatinine: 0.6 mg/dL (ref 0.44–1.00)
GFR, Estimated: >60 mL/min (ref >60)
Glucose, Bld: 203 mg/dL — ABNORMAL HIGH (ref 70–99)
Potassium: 3.3 mmol/L — ABNORMAL LOW (ref 3.5–5.1)
Sodium: 139 mmol/L (ref 135–145)
Total Bilirubin: 0.7 mg/dL (ref 0.0–1.2)
Total Protein: 6.6 g/dL (ref 6.5–8.1)

## 2023-12-29 MED ORDER — HEPARIN SOD (PORK) LOCK FLUSH 100 UNIT/ML IV SOLN
500.0000 [IU] | Freq: Once | INTRAVENOUS | Status: AC
  Administered 2023-12-29: 500 [IU] via INTRAVENOUS
  Filled 2023-12-29: qty 5

## 2023-12-29 MED ORDER — FILGRASTIM-SNDZ 480 MCG/0.8ML IJ SOSY
480.0000 ug | PREFILLED_SYRINGE | Freq: Every day | INTRAMUSCULAR | Status: DC
Start: 2023-12-29 — End: 2023-12-29
  Administered 2023-12-29: 480 ug via SUBCUTANEOUS
  Filled 2023-12-29: qty 0.8

## 2023-12-29 NOTE — Progress Notes (Signed)
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  Ultrasound of the left axilla demonstrated normal lymph nodes.  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  8 mm.  Tumor was ER/PR -0% and HER2 positive +3 by IHC.    Menarche at the age of 45.  She is G5, P5.  Age at first birth 63.  She has used birth control pills in the past.  She attained menopause in her 70s.  Family history significant for breast cancer in 2 sisters of her maternal grandmother.  Mom with cervical cancer in her 50s.  Paternal uncle with stomach cancer.  Her sister has been diagnosed with some form of skin cancer.   Left lumpectomy pathology from 11/04/2023 showed 10 mm grade 3 tumor with negative margins.  3 sentinel lymph nodes negative for malignancy.  Tumor was ER/PR negative and HER2 positive  Interval history-reports occasional numbness and swelling in her left foot which comes and goes.  Denies other complaints  at this time  ECOG PS- 1 Pain scale- 0   Review of systems- Review of Systems  Constitutional:  Negative for chills, fever, malaise/fatigue and weight loss.  HENT:  Negative for congestion, ear discharge and nosebleeds.   Eyes:  Negative for blurred vision.  Respiratory:  Negative for cough, hemoptysis, sputum production, shortness of breath and wheezing.   Cardiovascular:  Negative for chest pain, palpitations, orthopnea and claudication.  Gastrointestinal:  Negative for abdominal pain, blood in stool, constipation, diarrhea, heartburn, melena, nausea and vomiting.  Genitourinary:  Negative for dysuria, flank pain, frequency, hematuria and urgency.  Musculoskeletal:  Negative for back pain, joint pain and myalgias.  Skin:  Negative for rash.  Neurological:  Negative for dizziness, tingling, focal weakness, seizures, weakness and headaches.  Endo/Heme/Allergies:  Does not bruise/bleed easily.  Psychiatric/Behavioral:  Negative for depression and suicidal ideas. The patient does not have insomnia.       Allergies  Allergen Reactions   Paclitaxel Shortness Of Breath, Other (See Comments) and Hypertension    Redness to face/chest Back Pain     Flagyl [Metronidazole] Itching     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, estrogen receptor negative (HCC)  Obesity    Ovarian cyst    Palpitations    Sigmoid diverticulitis 08/2012   Subclinical hypothyroidism    SVT (supraventricular tachycardia) (HCC)    on Holter monitor 5 beat run of svt   Thyroid nodule    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   Type 2 diabetes mellitus (HCC)      Past Surgical History:  Procedure Laterality Date    AXILLARY SENTINEL NODE BIOPSY Left 11/04/2023   Procedure: AXILLARY SENTINEL NODE BIOPSY;  Surgeon: Campbell Lerner, MD;  Location: [facility] ORS;  Service: General;  Laterality: Left;   BLADDER SUSPENSION  2009   BREAST BIOPSY Left 10/22/2023   [place] Core Bx, heart clip - path pending   BREAST BIOPSY Left 10/22/2023   [place] LT BREAST BX W LOC DEV 1ST LESION IMG BX SPEC US GUIDE 10/22/2023 [facility]   BREAST LUMPECTOMY WITH RADIOFREQUENCY TAG IDENTIFICATION Left 11/04/2023   Procedure: BREAST LUMPECTOMY WITH RADIOFREQUENCY TAG IDENTIFICATION;  Surgeon: Campbell Lerner, MD;  Location: [facility] ORS;  Service: General;  Laterality: Left;   Colectomy Left    likely sigmoid, open.  for diverticulitis.   COLONOSCOPY WITH PROPOFOL N/A 10/01/2022   Procedure: COLONOSCOPY WITH PROPOFOL;  Surgeon: Toney Reil, MD;  Location: [facility];  Service: Gastroenterology;  Laterality: N/A;   PORTACATH PLACEMENT Right 11/04/2023   Procedure: INSERTION PORT-A-CATH;  Surgeon: Campbell Lerner, MD;  Location: [facility] ORS;  Service: General;  Laterality: Right;   TUBAL LIGATION     XI ROBOTIC LAPAROSCOPIC ASSISTED APPENDECTOMY N/A 12/29/2022   Procedure: XI ROBOTIC LYSIS OF ADHESIONS AND APPENDECTOMY;  Surgeon: Campbell Lerner, MD;  Location: [facility] ORS;  Service: General;  Laterality: N/A;    Social History   Socioeconomic History   Marital status: Single    Spouse name: Not on file   Number of children: Not on file   Years of education: Not on file   Highest education level: Some college, no degree  Occupational History   Not on file  Tobacco Use   Smoking status: Former    Current packs/day: 0.00    Types: Cigarettes, Cigars    Quit date: 09/19/2011    Years since quitting: 12.2   Smokeless tobacco: Never  Vaping Use   Vaping status: Former  Substance and Sexual Activity   Alcohol use: Not Currently    Comment: Occasional glass of wine   Drug use: No   Sexual activity: Not Currently  Other  Topics Concern   Not on file  Social History Narrative   Works in Audiological scientist at [employer].   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   Social Drivers of Corporate investment banker Strain: Low Risk  (06/17/2023)   Overall Financial Resource Strain (CARDIA)    Difficulty of Paying Living Expenses: Not very hard  Food Insecurity: No Food Insecurity (10/28/2023)   Hunger Vital Sign    Worried About Running Out of Food in the Last Year: Never true    Ran Out of Food in the Last Year: Never true  Transportation Needs: No Transportation Needs (10/28/2023)   PRAPARE - Administrator, Civil Service (Medical): No    Lack of Transportation (Non-Medical): No  Physical Activity: Sufficiently Active (06/17/2023)   Exercise Vital Sign    Days of Exercise per Week: 7 days    Minutes of Exercise per Session: 60 min  Stress: Stress Concern Present (06/17/2023)   [employer] of Occupational Health - Occupational Stress Questionnaire  Feeling of Stress : To some extent  Social Connections: Moderately Integrated (06/17/2023)   Social Connection and Isolation Panel [NHANES]    Frequency of Communication with Friends and Family: More than three times a week    Frequency of Social Gatherings with Friends and Family: Once a week    Attends Religious Services: More than 4 times per year    Active Member of [employer] or Organizations: Yes    Attends Banker Meetings: More than 4 times per year    Marital Status: Divorced  Intimate Partner Violence: Not At Risk (10/28/2023)   Humiliation, Afraid, Rape, and Kick questionnaire    Fear of Current or Ex-Partner: No    Emotionally Abused: No    Physically Abused: No    Sexually Abused: No    Family History  Problem Relation Age of Onset   Cancer - Cervical Mother        dx 89s; Passed away from Kidney Issues (Not Cancer)   Heart disease Father    Kidney disease Father    Diabetes Father    Skin cancer Sister     Stomach cancer Paternal Uncle    Diabetes Maternal Grandmother    Skin cancer Maternal Grandfather    Diabetes Paternal Grandmother    Breast cancer Other        mat great aunts x2; unk age of dx   Lung cancer Cousin    Brain cancer Cousin      Current Outpatient Medications:    acetaminophen (TYLENOL) 500 MG tablet, Take 500 mg by mouth every 6 (six) hours as needed., Disp: , Rfl:    atenolol (TENORMIN) 25 MG tablet, TAKE 1 TABLET (25 MG TOTAL) BY MOUTH DAILY. FOR PALPITATIONS. (Patient taking differently: Take 25 mg by mouth at bedtime. For palpitations.), Disp: 90 tablet, Rfl: 2   azithromycin (ZITHROMAX Z-PAK) 250 MG tablet, 500mg  once for 1 day, then 250mg  once daily for 4 days, Disp: 6 each, Rfl: 0   Biotin 1000 MCG tablet, Take 1,000 mcg by mouth daily., Disp: , Rfl:    blood glucose meter kit and supplies KIT, Dispense based on patient and insurance preference. Use up to four times daily as directed. (FOR ICD-9 250.00, 250.01)., Disp: 1 each, Rfl: 0   cyanocobalamin 500 MCG tablet, Take 1,000 mcg by mouth daily. Reported on 06/12/2016, Disp: , Rfl:    docusate sodium (COLACE) 100 MG capsule, Take 200 mg by mouth daily as needed for moderate constipation. Reported on 06/12/2016, Disp: , Rfl:    ibuprofen (ADVIL) 800 MG tablet, Take 1 tablet (800 mg total) by mouth every 8 (eight) hours as needed., Disp: 30 tablet, Rfl: 0   lidocaine-prilocaine (EMLA) cream, Apply to affected area once, Disp: 30 g, Rfl: 3   magnesium 30 MG tablet, Take 30 mg by mouth daily as needed., Disp: , Rfl:    metFORMIN (GLUCOPHAGE-XR) 500 MG 24 hr tablet, TAKE 1 TABLET (500 MG TOTAL) BY MOUTH DAILY WITH BREAKFAST. FOR DIABETES., Disp: 90 tablet, Rfl: 0   ondansetron (ZOFRAN) 8 MG tablet, Take 1 tablet (8 mg total) by mouth every 8 (eight) hours as needed for nausea or vomiting., Disp: 30 tablet, Rfl: 1   prochlorperazine (COMPAZINE) 10 MG tablet, Take 1 tablet (10 mg total) by mouth every 6 (six) hours as needed for  nausea or vomiting., Disp: 30 tablet, Rfl: 1   pyridOXINE (VITAMIN B6) 25 MG tablet, Take 25 mg by mouth daily., Disp: , Rfl:  rosuvastatin (CRESTOR) 5 MG tablet, TAKE 1 TABLET (5 MG TOTAL) BY MOUTH EVERY EVENING. FOR CHOLESTEROL., Disp: 90 tablet, Rfl: 2 No current facility-administered medications for this visit.  Facility-Administered Medications Ordered in Other Visits:    0.9 %  sodium chloride infusion, , Intravenous, Continuous, Creig Hines, MD, Stopped at 11/27/23 1158   filgrastim-sndz (ZARXIO) injection 480 mcg, 480 mcg, Subcutaneous, Daily, Creig Hines, MD, 480 mcg at 12/29/23 0916  Physical exam:  Vitals:   12/29/23 0838  BP: 139/61  Pulse: 91  Resp: 18  Temp: 97.7 F (36.5 C)  TempSrc: Tympanic  SpO2: 99%  Weight: 216 lb 3.2 oz (98.1 kg)   Physical Exam Cardiovascular:     Rate and Rhythm: Normal rate and regular rhythm.     Heart sounds: Normal heart sounds.  Pulmonary:     Effort: Pulmonary effort is normal.     Breath sounds: Normal breath sounds.  Skin:    General: Skin is warm and dry.  Neurological:     Mental Status: She is alert and oriented to person, place, and time.         Latest Ref Rng & Units 12/22/2023   11:22 AM  CMP  Glucose 70 - 99 mg/dL 962   BUN 8 - 23 mg/dL 11   Creatinine 9.52 - 1.00 mg/dL 8.41   Sodium 324 - 401 mmol/L 136   Potassium 3.5 - 5.1 mmol/L 3.9   Chloride 98 - 111 mmol/L 102   CO2 22 - 32 mmol/L 24   Calcium 8.9 - 10.3 mg/dL 8.8   Total Protein 6.5 - 8.1 g/dL 7.1   Total Bilirubin 0.0 - 1.2 mg/dL 0.5   Alkaline Phos 38 - 126 U/L 89   AST 15 - 41 U/L 41   ALT 0 - 44 U/L 36       Latest Ref Rng & Units 12/29/2023    8:26 AM  CBC  WBC 4.0 - 10.5 K/uL 3.3   Hemoglobin 12.0 - 15.0 g/dL 02.7   Hematocrit 25.3 - 46.0 % 30.8   Platelets 150 - 400 K/uL 292     Assessment and plan- Patient is a 63 y.o. female with history of pathological prognostic stage Ia invasive mammary carcinoma of the left breast pT1b N0  M0 grade 3 ER/PR negative and HER2 positive.  She is here for on treatment assessment prior to cycle 4 of weekly Abraxane and Herceptin   Patient reacted immediately within 5 minutes of starting Taxol and therefore was switched to Abraxane.  She essentially did not get Taxol for cycle 1.  She was therefore switched to Abraxane.  This would therefore constitute cycle 4 of weekly Abraxane.  Her white cell count is low at 3.3 with an ANC of 0.6.  She can therefore not receive Abraxane today.  She will receive 3 days of Zarzio today and directly proceed for cycle 4 next week and I will see her back in 2 weeks for cycle 5.  She may land up needing Zarzio for every cycle or every other cycle.  Chemo-induced anemia: Continue to monitor   Visit Diagnosis 1. 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 in female, estrogen receptor negative (HCC)   2. Encounter for antineoplastic chemotherapy   3. Chemotherapy induced neutropenia (HCC)      Dr. Owens Shark, MD, MPH [facility] [phone] 12/29/2023 9:18 AM

## 2023-12-30 ENCOUNTER — Telehealth: Payer: Self-pay | Admitting: *Deleted

## 2023-12-30 ENCOUNTER — Encounter: Payer: Self-pay | Admitting: Oncology

## 2023-12-30 ENCOUNTER — Inpatient Hospital Stay: Payer: Commercial Managed Care - PPO

## 2023-12-30 DIAGNOSIS — Z5112 Encounter for antineoplastic immunotherapy: Secondary | ICD-10-CM | POA: Diagnosis not present

## 2023-12-30 DIAGNOSIS — Z171 Estrogen receptor negative status [ER-]: Secondary | ICD-10-CM

## 2023-12-30 MED ORDER — FILGRASTIM-SNDZ 480 MCG/0.8ML IJ SOSY
480.0000 ug | PREFILLED_SYRINGE | Freq: Every day | INTRAMUSCULAR | Status: DC
Start: 2023-12-30 — End: 2023-12-30
  Administered 2023-12-30: 480 ug via SUBCUTANEOUS
  Filled 2023-12-30: qty 0.8

## 2023-12-30 NOTE — Telephone Encounter (Signed)
Pt. Needed me to do [place] papers to fill put and the attended statement. It went though transmission and the pt . Was ok with that.

## 2023-12-31 ENCOUNTER — Inpatient Hospital Stay: Payer: Commercial Managed Care - PPO

## 2023-12-31 ENCOUNTER — Other Ambulatory Visit: Payer: Self-pay

## 2023-12-31 DIAGNOSIS — C50312 Malignant neoplasm of lower-inner quadrant of left female breast: Secondary | ICD-10-CM

## 2023-12-31 DIAGNOSIS — Z5112 Encounter for antineoplastic immunotherapy: Secondary | ICD-10-CM | POA: Diagnosis not present

## 2023-12-31 MED ORDER — FILGRASTIM-SNDZ 480 MCG/0.8ML IJ SOSY
480.0000 ug | PREFILLED_SYRINGE | Freq: Every day | INTRAMUSCULAR | Status: DC
  Administered 2023-12-31: 480 ug via SUBCUTANEOUS
  Filled 2023-12-31: qty 0.8

## 2024-01-05 ENCOUNTER — Ambulatory Visit: Payer: Commercial Managed Care - PPO | Admitting: Oncology

## 2024-01-05 ENCOUNTER — Inpatient Hospital Stay (HOSPITAL_BASED_OUTPATIENT_CLINIC_OR_DEPARTMENT_OTHER): Payer: Commercial Managed Care - PPO | Admitting: Oncology

## 2024-01-05 ENCOUNTER — Ambulatory Visit: Payer: Commercial Managed Care - PPO

## 2024-01-05 ENCOUNTER — Other Ambulatory Visit: Payer: Commercial Managed Care - PPO

## 2024-01-05 ENCOUNTER — Encounter: Payer: Self-pay | Admitting: Oncology

## 2024-01-05 ENCOUNTER — Inpatient Hospital Stay: Payer: Commercial Managed Care - PPO

## 2024-01-05 VITALS — BP 144/67 | HR 85 | Temp 98.4°F | Resp 18 | Wt 209.0 lb

## 2024-01-05 DIAGNOSIS — Z171 Estrogen receptor negative status [ER-]: Secondary | ICD-10-CM

## 2024-01-05 DIAGNOSIS — C50312 Malignant neoplasm of lower-inner quadrant of left female breast: Secondary | ICD-10-CM

## 2024-01-05 DIAGNOSIS — G620 Drug-induced polyneuropathy: Secondary | ICD-10-CM

## 2024-01-05 DIAGNOSIS — Z5112 Encounter for antineoplastic immunotherapy: Secondary | ICD-10-CM | POA: Diagnosis not present

## 2024-01-05 DIAGNOSIS — D701 Agranulocytosis secondary to cancer chemotherapy: Secondary | ICD-10-CM

## 2024-01-05 DIAGNOSIS — Z5111 Encounter for antineoplastic chemotherapy: Secondary | ICD-10-CM

## 2024-01-05 DIAGNOSIS — T451X5A Adverse effect of antineoplastic and immunosuppressive drugs, initial encounter: Secondary | ICD-10-CM

## 2024-01-05 LAB — CBC WITH DIFFERENTIAL (CANCER CENTER ONLY)
Abs Immature Granulocytes: 0.98 10*3/uL — ABNORMAL HIGH (ref 0.00–0.07)
Basophils Absolute: 0.1 10*3/uL (ref 0.0–0.1)
Basophils Relative: 1 %
Eosinophils Absolute: 0.1 10*3/uL (ref 0.0–0.5)
Eosinophils Relative: 2 %
HCT: 34.4 % — ABNORMAL LOW (ref 36.0–46.0)
Hemoglobin: 11.4 g/dL — ABNORMAL LOW (ref 12.0–15.0)
Immature Granulocytes: 11 %
Lymphocytes Relative: 38 %
Lymphs Abs: 3.3 10*3/uL (ref 0.7–4.0)
MCH: 29.9 pg (ref 26.0–34.0)
MCHC: 33.1 g/dL (ref 30.0–36.0)
MCV: 90.3 fL (ref 80.0–100.0)
Monocytes Absolute: 1.4 10*3/uL — ABNORMAL HIGH (ref 0.1–1.0)
Monocytes Relative: 16 %
Neutro Abs: 2.7 10*3/uL (ref 1.7–7.7)
Neutrophils Relative %: 32 %
Platelet Count: 353 10*3/uL (ref 150–400)
RBC: 3.81 MIL/uL — ABNORMAL LOW (ref 3.87–5.11)
RDW: 14.3 % (ref 11.5–15.5)
Smear Review: NORMAL
WBC Count: 8.6 10*3/uL (ref 4.0–10.5)
nRBC: 0.7 % — ABNORMAL HIGH (ref 0.0–0.2)

## 2024-01-05 LAB — CMP (CANCER CENTER ONLY)
ALT: 32 U/L (ref 0–44)
AST: 40 U/L (ref 15–41)
Albumin: 3.7 g/dL (ref 3.5–5.0)
Alkaline Phosphatase: 89 U/L (ref 38–126)
Anion gap: 10 (ref 5–15)
BUN: 9 mg/dL (ref 8–23)
CO2: 23 mmol/L (ref 22–32)
Calcium: 7.5 mg/dL — ABNORMAL LOW (ref 8.9–10.3)
Chloride: 106 mmol/L (ref 98–111)
Creatinine: 0.72 mg/dL (ref 0.44–1.00)
GFR, Estimated: >60 mL/min (ref >60)
Glucose, Bld: 200 mg/dL — ABNORMAL HIGH (ref 70–99)
Potassium: 4.5 mmol/L (ref 3.5–5.1)
Sodium: 139 mmol/L (ref 135–145)
Total Bilirubin: 0.4 mg/dL (ref 0.0–1.2)
Total Protein: 6.7 g/dL (ref 6.5–8.1)

## 2024-01-05 MED ORDER — PACLITAXEL PROTEIN-BOUND CHEMO INJECTION 100 MG
80.0000 mg/m2 | Freq: Once | INTRAVENOUS | Status: AC
  Administered 2024-01-05: 175 mg via INTRAVENOUS
  Filled 2024-01-05: qty 35

## 2024-01-05 MED ORDER — DIPHENHYDRAMINE HCL 50 MG/ML IJ SOLN
50.0000 mg | Freq: Once | INTRAMUSCULAR | Status: AC
  Administered 2024-01-05: 50 mg via INTRAVENOUS
  Filled 2024-01-05: qty 1

## 2024-01-05 MED ORDER — TRASTUZUMAB-ANNS CHEMO 150 MG IV SOLR
2.0000 mg/kg | Freq: Once | INTRAVENOUS | Status: AC
  Administered 2024-01-05: 189 mg via INTRAVENOUS
  Filled 2024-01-05: qty 9

## 2024-01-05 MED ORDER — SODIUM CHLORIDE 0.9 % IV SOLN
INTRAVENOUS | Status: DC
  Filled 2024-01-05: qty 250

## 2024-01-05 MED ORDER — ACETAMINOPHEN 325 MG PO TABS
650.0000 mg | ORAL_TABLET | Freq: Once | ORAL | Status: AC
  Administered 2024-01-05: 650 mg via ORAL
  Filled 2024-01-05: qty 2

## 2024-01-05 MED ORDER — PROCHLORPERAZINE MALEATE 10 MG PO TABS
10.0000 mg | ORAL_TABLET | Freq: Once | ORAL | Status: AC
  Administered 2024-01-05: 10 mg via ORAL
  Filled 2024-01-05: qty 1

## 2024-01-05 NOTE — Patient Instructions (Signed)
[facility]  Discharge Instructions: Thank you for choosing [facility] to provide your oncology and hematology care.  If you have a lab appointment with the Cancer Center, please go directly to the Cancer Center and check in at the registration area.  Wear comfortable clothing and clothing appropriate for easy access to any Portacath or PICC line.   We strive to give you quality time with your provider. You may need to reschedule your appointment if you arrive late (15 or more minutes).  Arriving late affects you and other patients whose appointments are after yours.  Also, if you miss three or more appointments without notifying the office, you may be dismissed from the clinic at the provider's discretion.      For prescription refill requests, have your pharmacy contact our office and allow 72 hours for refills to be completed.    Today you received the following chemotherapy and/or immunotherapy agents Kanjinti & Abraxane      To help prevent nausea and vomiting after your treatment, we encourage you to take your nausea medication as directed.  BELOW ARE SYMPTOMS THAT SHOULD BE REPORTED IMMEDIATELY: *FEVER GREATER THAN 100.4 F (38 C) OR HIGHER *CHILLS OR SWEATING *NAUSEA AND VOMITING THAT IS NOT CONTROLLED WITH YOUR NAUSEA MEDICATION *UNUSUAL SHORTNESS OF BREATH *UNUSUAL BRUISING OR BLEEDING *URINARY PROBLEMS (pain or burning when urinating, or frequent urination) *BOWEL PROBLEMS (unusual diarrhea, constipation, pain near the anus) TENDERNESS IN MOUTH AND THROAT WITH OR WITHOUT PRESENCE OF ULCERS (sore throat, sores in mouth, or a toothache) UNUSUAL RASH, SWELLING OR PAIN  UNUSUAL VAGINAL DISCHARGE OR ITCHING   Items with * indicate a potential emergency and should be followed up as soon as possible or go to the Emergency Department if any problems should occur.  Please show the CHEMOTHERAPY ALERT CARD or  IMMUNOTHERAPY ALERT CARD at check-in to the Emergency Department and triage nurse.  Should you have questions after your visit or need to cancel or reschedule your appointment, please contact [facility] - A DEPT OF [facility]  [phone] and follow the prompts.  Office hours are 8:00 a.m. to 4:30 p.m. Monday - Friday. Please note that voicemails left after 4:00 p.m. may not be returned until the following business day.  We are closed weekends and major holidays. You have access to a nurse at all times for urgent questions. Please call the main number to the clinic [phone] and follow the prompts.  For any non-urgent questions, you may also contact your provider using MyChart. We now offer e-Visits for anyone 69 and older to request care online for non-urgent symptoms. For details visit [email or web address].   Also download the MyChart app! Go to the app store, search "MyChart", open the app, select [place], and log in with your MyChart username and password.

## 2024-01-05 NOTE — Progress Notes (Signed)
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  Ultrasound of the left axilla demonstrated normal lymph nodes.  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  8 mm.  Tumor was ER/PR -0% and HER2 positive +3 by IHC.    Menarche at the age of 16.  She is G5, P5.  Age at first birth 43.  She has used birth control pills in the past.  She attained menopause in her 57s.  Family history significant for breast cancer in 2 sisters of her maternal grandmother.  Mom with cervical cancer in her 60s.  Paternal uncle with stomach cancer.  Her sister has been diagnosed with some form of skin cancer.   Left lumpectomy pathology from 11/04/2023 showed 10 mm grade 3 tumor with negative margins.  3 sentinel lymph nodes negative for malignancy.  Tumor was ER/PR negative and HER2 positive  Interval history-patient reports more consistent tingling numbness especially in her left lower extremity.  She has been having  some symptoms of nasal congestion and an occasional cough and wheezing especially at night  ECOG PS- 0 Pain scale- 0 Opioid associated constipation- no  Review of systems- Review of Systems  Constitutional:  Negative for chills, fever, malaise/fatigue and weight loss.  HENT:  Positive for congestion. Negative for ear discharge and nosebleeds.   Eyes:  Negative for blurred vision.  Respiratory:  Positive for cough. Negative for hemoptysis, sputum production, shortness of breath and wheezing.   Cardiovascular:  Negative for chest pain, palpitations, orthopnea and claudication.  Gastrointestinal:  Negative for abdominal pain, blood in stool, constipation, diarrhea, heartburn, melena, nausea and vomiting.  Genitourinary:  Negative for dysuria, flank pain, frequency, hematuria and urgency.  Musculoskeletal:  Negative for back pain, joint pain and myalgias.  Skin:  Negative for rash.  Neurological:  Positive for sensory change (Peripheral neuropathy). Negative for dizziness, tingling, focal weakness, seizures, weakness and headaches.  Endo/Heme/Allergies:  Does not bruise/bleed easily.  Psychiatric/Behavioral:  Negative for depression and suicidal ideas. The patient does not have insomnia.       Allergies  Allergen Reactions   Paclitaxel Shortness Of Breath, Other (See Comments) and Hypertension    Redness to face/chest Back Pain     Flagyl [Metronidazole] Itching     Past Medical History:  Diagnosis Date   Adenoma of appendix    Anemia    Chronic lower back pain    GERD (gastroesophageal reflux disease)    H/O gestational diabetes mellitus, not currently pregnant    History of kidney stones  Hypercholesteremia    Hyperplastic rectal polyp    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, estrogen receptor negative (HCC)    Obesity    Ovarian cyst    Palpitations    Sigmoid diverticulitis 08/2012   Subclinical hypothyroidism    SVT (supraventricular  tachycardia) (HCC)    on Holter monitor 5 beat run of svt   Thyroid nodule    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   Type 2 diabetes mellitus (HCC)      Past Surgical History:  Procedure Laterality Date   AXILLARY SENTINEL NODE BIOPSY Left 11/04/2023   Procedure: AXILLARY SENTINEL NODE BIOPSY;  Surgeon: Campbell Lerner, MD;  Location: [facility] ORS;  Service: General;  Laterality: Left;   BLADDER SUSPENSION  2009   BREAST BIOPSY Left 10/22/2023   [place] Core Bx, heart clip - path pending   BREAST BIOPSY Left 10/22/2023   [place] LT BREAST BX W LOC DEV 1ST LESION IMG BX SPEC US GUIDE 10/22/2023 [facility]   BREAST LUMPECTOMY WITH RADIOFREQUENCY TAG IDENTIFICATION Left 11/04/2023   Procedure: BREAST LUMPECTOMY WITH RADIOFREQUENCY TAG IDENTIFICATION;  Surgeon: Campbell Lerner, MD;  Location: [facility] ORS;  Service: General;  Laterality: Left;   Colectomy Left    likely sigmoid, open.  for diverticulitis.   COLONOSCOPY WITH PROPOFOL N/A 10/01/2022   Procedure: COLONOSCOPY WITH PROPOFOL;  Surgeon: Toney Reil, MD;  Location: [facility];  Service: Gastroenterology;  Laterality: N/A;   PORTACATH PLACEMENT Right 11/04/2023   Procedure: INSERTION PORT-A-CATH;  Surgeon: Campbell Lerner, MD;  Location: [facility] ORS;  Service: General;  Laterality: Right;   TUBAL LIGATION     XI ROBOTIC LAPAROSCOPIC ASSISTED APPENDECTOMY N/A 12/29/2022   Procedure: XI ROBOTIC LYSIS OF ADHESIONS AND APPENDECTOMY;  Surgeon: Campbell Lerner, MD;  Location: [facility] ORS;  Service: General;  Laterality: N/A;    Social History   Socioeconomic History   Marital status: Single    Spouse name: Not on file   Number of children: Not on file   Years of education: Not on file   Highest education level: Some college, no degree  Occupational History   Not on file  Tobacco Use   Smoking status: Former    Current packs/day: 0.00    Types: Cigarettes, Cigars    Quit date: 09/19/2011    Years since quitting: 12.3   Smokeless tobacco:  Never  Vaping Use   Vaping status: Former  Substance and Sexual Activity   Alcohol use: Not Currently    Comment: Occasional glass of wine   Drug use: No   Sexual activity: Not Currently  Other Topics Concern   Not on file  Social History Narrative   Works in Audiological scientist at [employer].   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   Social Drivers of Corporate investment banker Strain: Low Risk  (06/17/2023)   Overall Financial Resource Strain (CARDIA)    Difficulty of Paying Living Expenses: Not very hard  Food Insecurity: No Food Insecurity (10/28/2023)   Hunger Vital Sign    Worried About Running Out of Food in the Last Year: Never true    Ran Out of Food in the Last Year: Never true  Transportation Needs: No Transportation Needs (10/28/2023)   PRAPARE - Administrator, Civil Service (Medical): No    Lack of Transportation (Non-Medical): No  Physical Activity: Sufficiently Active (06/17/2023)   Exercise Vital Sign    Days of Exercise per Week: 7 days    Minutes  of Exercise per Session: 60 min  Stress: Stress Concern Present (06/17/2023)   [employer] of Occupational Health - Occupational Stress Questionnaire    Feeling of Stress : To some extent  Social Connections: Moderately Integrated (06/17/2023)   Social Connection and Isolation Panel [NHANES]    Frequency of Communication with Friends and Family: More than three times a week    Frequency of Social Gatherings with Friends and Family: Once a week    Attends Religious Services: More than 4 times per year    Active Member of [employer] or Organizations: Yes    Attends Banker Meetings: More than 4 times per year    Marital Status: Divorced  Intimate Partner Violence: Not At Risk (10/28/2023)   Humiliation, Afraid, Rape, and Kick questionnaire    Fear of Current or Ex-Partner: No    Emotionally Abused: No    Physically Abused: No    Sexually Abused: No    Family History  Problem  Relation Age of Onset   Cancer - Cervical Mother        dx 15s; Passed away from Kidney Issues (Not Cancer)   Heart disease Father    Kidney disease Father    Diabetes Father    Skin cancer Sister    Stomach cancer Paternal Uncle    Diabetes Maternal Grandmother    Skin cancer Maternal Grandfather    Diabetes Paternal Grandmother    Breast cancer Other        mat great aunts x2; unk age of dx   Lung cancer Cousin    Brain cancer Cousin      Current Outpatient Medications:    acetaminophen (TYLENOL) 500 MG tablet, Take 500 mg by mouth every 6 (six) hours as needed., Disp: , Rfl:    atenolol (TENORMIN) 25 MG tablet, TAKE 1 TABLET (25 MG TOTAL) BY MOUTH DAILY. FOR PALPITATIONS. (Patient taking differently: Take 25 mg by mouth at bedtime. For palpitations.), Disp: 90 tablet, Rfl: 2   azithromycin (ZITHROMAX Z-PAK) 250 MG tablet, 500mg  once for 1 day, then 250mg  once daily for 4 days, Disp: 6 each, Rfl: 0   Biotin 1000 MCG tablet, Take 1,000 mcg by mouth daily., Disp: , Rfl:    blood glucose meter kit and supplies KIT, Dispense based on patient and insurance preference. Use up to four times daily as directed. (FOR ICD-9 250.00, 250.01)., Disp: 1 each, Rfl: 0   cyanocobalamin 500 MCG tablet, Take 1,000 mcg by mouth daily. Reported on 06/12/2016, Disp: , Rfl:    docusate sodium (COLACE) 100 MG capsule, Take 200 mg by mouth daily as needed for moderate constipation. Reported on 06/12/2016, Disp: , Rfl:    ibuprofen (ADVIL) 800 MG tablet, Take 1 tablet (800 mg total) by mouth every 8 (eight) hours as needed., Disp: 30 tablet, Rfl: 0   lidocaine-prilocaine (EMLA) cream, Apply to affected area once, Disp: 30 g, Rfl: 3   magnesium 30 MG tablet, Take 30 mg by mouth daily as needed., Disp: , Rfl:    metFORMIN (GLUCOPHAGE-XR) 500 MG 24 hr tablet, TAKE 1 TABLET (500 MG TOTAL) BY MOUTH DAILY WITH BREAKFAST. FOR DIABETES., Disp: 90 tablet, Rfl: 0   ondansetron (ZOFRAN) 8 MG tablet, Take 1 tablet (8 mg total)  by mouth every 8 (eight) hours as needed for nausea or vomiting., Disp: 30 tablet, Rfl: 1   prochlorperazine (COMPAZINE) 10 MG tablet, Take 1 tablet (10 mg total) by mouth every 6 (six) hours as needed for  nausea or vomiting., Disp: 30 tablet, Rfl: 1   pyridOXINE (VITAMIN B6) 25 MG tablet, Take 25 mg by mouth daily., Disp: , Rfl:    rosuvastatin (CRESTOR) 5 MG tablet, TAKE 1 TABLET (5 MG TOTAL) BY MOUTH EVERY EVENING. FOR CHOLESTEROL., Disp: 90 tablet, Rfl: 2 No current facility-administered medications for this visit.  Facility-Administered Medications Ordered in Other Visits:    0.9 %  sodium chloride infusion, , Intravenous, Continuous, Creig Hines, MD, Stopped at 11/27/23 1158   0.9 %  sodium chloride infusion, , Intravenous, Continuous, Creig Hines, MD, Last Rate: 10 mL/hr at 01/05/24 1004, New Bag at 01/05/24 1004   PACLitaxel-protein bound (ABRAXANE) chemo infusion 175 mg, 80 mg/m2 (Treatment Plan Recorded), Intravenous, Once, Creig Hines, MD   trastuzumab-[facility]) 189 mg in sodium chloride 0.9 % 250 mL chemo infusion, 2 mg/kg (Treatment Plan Recorded), Intravenous, Once, Creig Hines, MD  Physical exam:  Vitals:   01/05/24 0913  BP: (!) 144/67  Pulse: 85  Resp: 18  Temp: 98.4 F (36.9 C)  TempSrc: Tympanic  SpO2: 97%  Weight: 209 lb (94.8 kg)   Physical Exam Cardiovascular:     Rate and Rhythm: Normal rate and regular rhythm.     Heart sounds: Normal heart sounds.  Pulmonary:     Effort: Pulmonary effort is normal.     Breath sounds: Normal breath sounds.  Skin:    General: Skin is warm and dry.  Neurological:     Mental Status: She is alert and oriented to person, place, and time.         Latest Ref Rng & Units 01/05/2024    8:53 AM  CMP  Glucose 70 - 99 mg/dL 756   BUN 8 - 23 mg/dL 9   Creatinine 4.33 - 2.95 mg/dL 1.88   Sodium 416 - 606 mmol/L 139   Potassium 3.5 - 5.1 mmol/L 4.5   Chloride 98 - 111 mmol/L 106   CO2 22 - 32 mmol/L 23    Calcium 8.9 - 10.3 mg/dL 7.5   Total Protein 6.5 - 8.1 g/dL 6.7   Total Bilirubin 0.0 - 1.2 mg/dL 0.4   Alkaline Phos 38 - 126 U/L 89   AST 15 - 41 U/L 40   ALT 0 - 44 U/L 32       Latest Ref Rng & Units 01/05/2024    8:53 AM  CBC  WBC 4.0 - 10.5 K/uL 8.6   Hemoglobin 12.0 - 15.0 g/dL 30.1   Hematocrit 60.1 - 46.0 % 34.4   Platelets 150 - 400 K/uL 353     No images are attached to the encounter.  No results found.   Assessment and plan- Patient is a 63 y.o. female  with history of pathological prognostic stage Ia invasive mammary carcinoma of the left breast pT1b N0 M0 grade 3 ER/PR negative and HER2 positive.  She is here for on treatment assessment prior to cycle 4 of Abraxane and Herceptin  Patient received 3 doses of Zarzio last week and counts are okay to proceed with cycle 4 of weekly Abraxane and Herceptin today.  Reducing the dose of Abraxane to 80 mg/m given her symptoms of neuropathy.  She is likely to need Zarzio with every cycle.  Chemo induced peripheral neuropathy: Dose reduced as above.  Otherwise continue to monitor  Chemo induced neutropenia: Will receive Zarzio with every cycle  Chemo-induced anemia: Hemoglobin stable between 10.5-11.5 continue to monitor  Symptoms of nasal  congestion and cough likely viral continue to monitor   Visit Diagnosis 1. 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 in female, estrogen receptor negative (HCC)   2. Encounter for antineoplastic chemotherapy   3. Chemotherapy-induced peripheral neuropathy (HCC)   4. Chemotherapy induced neutropenia (HCC)      Dr. Owens Shark, MD, MPH [facility] [phone] 01/05/2024 10:27 AM

## 2024-01-06 ENCOUNTER — Inpatient Hospital Stay: Payer: Commercial Managed Care - PPO

## 2024-01-06 DIAGNOSIS — Z171 Estrogen receptor negative status [ER-]: Secondary | ICD-10-CM

## 2024-01-06 DIAGNOSIS — Z5112 Encounter for antineoplastic immunotherapy: Secondary | ICD-10-CM | POA: Diagnosis not present

## 2024-01-06 MED ORDER — FILGRASTIM-SNDZ 480 MCG/0.8ML IJ SOSY
480.0000 ug | PREFILLED_SYRINGE | Freq: Every day | INTRAMUSCULAR | Status: DC
  Administered 2024-01-06: 480 ug via SUBCUTANEOUS
  Filled 2024-01-06: qty 0.8

## 2024-01-07 ENCOUNTER — Inpatient Hospital Stay: Payer: Commercial Managed Care - PPO

## 2024-01-07 DIAGNOSIS — Z5112 Encounter for antineoplastic immunotherapy: Secondary | ICD-10-CM | POA: Diagnosis not present

## 2024-01-07 DIAGNOSIS — C50312 Malignant neoplasm of lower-inner quadrant of left female breast: Secondary | ICD-10-CM

## 2024-01-07 MED ORDER — FILGRASTIM-SNDZ 480 MCG/0.8ML IJ SOSY
480.0000 ug | PREFILLED_SYRINGE | Freq: Every day | INTRAMUSCULAR | Status: DC
  Administered 2024-01-07: 480 ug via SUBCUTANEOUS
  Filled 2024-01-07: qty 0.8

## 2024-01-08 ENCOUNTER — Inpatient Hospital Stay: Payer: Commercial Managed Care - PPO

## 2024-01-08 ENCOUNTER — Encounter: Payer: Self-pay | Admitting: Oncology

## 2024-01-08 DIAGNOSIS — C50312 Malignant neoplasm of lower-inner quadrant of left female breast: Secondary | ICD-10-CM

## 2024-01-08 DIAGNOSIS — Z5112 Encounter for antineoplastic immunotherapy: Secondary | ICD-10-CM | POA: Diagnosis not present

## 2024-01-08 MED ORDER — FILGRASTIM-SNDZ 480 MCG/0.8ML IJ SOSY
480.0000 ug | PREFILLED_SYRINGE | Freq: Every day | INTRAMUSCULAR | Status: DC
Start: 2024-01-08 — End: 2024-01-08
  Administered 2024-01-08: 480 ug via SUBCUTANEOUS
  Filled 2024-01-08: qty 0.8

## 2024-01-12 ENCOUNTER — Inpatient Hospital Stay: Payer: Commercial Managed Care - PPO

## 2024-01-12 ENCOUNTER — Ambulatory Visit: Payer: Commercial Managed Care - PPO

## 2024-01-12 ENCOUNTER — Inpatient Hospital Stay: Payer: Commercial Managed Care - PPO | Attending: Oncology

## 2024-01-12 ENCOUNTER — Ambulatory Visit: Payer: Commercial Managed Care - PPO | Admitting: Oncology

## 2024-01-12 ENCOUNTER — Other Ambulatory Visit: Payer: Commercial Managed Care - PPO

## 2024-01-12 VITALS — BP 135/67 | HR 89 | Temp 96.0°F | Resp 18 | Wt 216.7 lb

## 2024-01-12 DIAGNOSIS — Z5112 Encounter for antineoplastic immunotherapy: Secondary | ICD-10-CM | POA: Diagnosis present

## 2024-01-12 DIAGNOSIS — C50312 Malignant neoplasm of lower-inner quadrant of left female breast: Secondary | ICD-10-CM | POA: Insufficient documentation

## 2024-01-12 DIAGNOSIS — Z5111 Encounter for antineoplastic chemotherapy: Secondary | ICD-10-CM | POA: Diagnosis present

## 2024-01-12 DIAGNOSIS — Z1731 Human epidermal growth factor receptor 2 positive status: Secondary | ICD-10-CM | POA: Insufficient documentation

## 2024-01-12 DIAGNOSIS — Z1722 Progesterone receptor negative status: Secondary | ICD-10-CM | POA: Insufficient documentation

## 2024-01-12 DIAGNOSIS — Z79899 Other long term (current) drug therapy: Secondary | ICD-10-CM | POA: Diagnosis not present

## 2024-01-12 DIAGNOSIS — D701 Agranulocytosis secondary to cancer chemotherapy: Secondary | ICD-10-CM | POA: Insufficient documentation

## 2024-01-12 DIAGNOSIS — Z171 Estrogen receptor negative status [ER-]: Secondary | ICD-10-CM | POA: Diagnosis not present

## 2024-01-12 LAB — CBC WITH DIFFERENTIAL (CANCER CENTER ONLY)
Abs Immature Granulocytes: 0.05 10*3/uL (ref 0.00–0.07)
Basophils Absolute: 0.1 10*3/uL (ref 0.0–0.1)
Basophils Relative: 3 %
Eosinophils Absolute: 0.2 10*3/uL (ref 0.0–0.5)
Eosinophils Relative: 4 %
HCT: 32.5 % — ABNORMAL LOW (ref 36.0–46.0)
Hemoglobin: 10.9 g/dL — ABNORMAL LOW (ref 12.0–15.0)
Immature Granulocytes: 1 %
Lymphocytes Relative: 52 %
Lymphs Abs: 2 10*3/uL (ref 0.7–4.0)
MCH: 30.3 pg (ref 26.0–34.0)
MCHC: 33.5 g/dL (ref 30.0–36.0)
MCV: 90.3 fL (ref 80.0–100.0)
Monocytes Absolute: 0.3 10*3/uL (ref 0.1–1.0)
Monocytes Relative: 9 %
Neutro Abs: 1.2 10*3/uL — ABNORMAL LOW (ref 1.7–7.7)
Neutrophils Relative %: 31 %
Platelet Count: 295 10*3/uL (ref 150–400)
RBC: 3.6 MIL/uL — ABNORMAL LOW (ref 3.87–5.11)
RDW: 13.9 % (ref 11.5–15.5)
WBC Count: 3.9 10*3/uL — ABNORMAL LOW (ref 4.0–10.5)
nRBC: 0 % (ref 0.0–0.2)

## 2024-01-12 LAB — CMP (CANCER CENTER ONLY)
ALT: 34 U/L (ref 0–44)
AST: 41 U/L (ref 15–41)
Albumin: 3.5 g/dL (ref 3.5–5.0)
Alkaline Phosphatase: 101 U/L (ref 38–126)
Anion gap: 8 (ref 5–15)
BUN: 10 mg/dL (ref 8–23)
CO2: 23 mmol/L (ref 22–32)
Calcium: 8.6 mg/dL — ABNORMAL LOW (ref 8.9–10.3)
Chloride: 107 mmol/L (ref 98–111)
Creatinine: 0.69 mg/dL (ref 0.44–1.00)
GFR, Estimated: >60 mL/min (ref >60)
Glucose, Bld: 210 mg/dL — ABNORMAL HIGH (ref 70–99)
Potassium: 3.6 mmol/L (ref 3.5–5.1)
Sodium: 138 mmol/L (ref 135–145)
Total Bilirubin: 0.6 mg/dL (ref 0.0–1.2)
Total Protein: 6.8 g/dL (ref 6.5–8.1)

## 2024-01-12 MED ORDER — DIPHENHYDRAMINE HCL 50 MG/ML IJ SOLN
50.0000 mg | Freq: Once | INTRAMUSCULAR | Status: AC
  Administered 2024-01-12: 50 mg via INTRAVENOUS
  Filled 2024-01-12: qty 1

## 2024-01-12 MED ORDER — PROCHLORPERAZINE MALEATE 10 MG PO TABS
10.0000 mg | ORAL_TABLET | Freq: Once | ORAL | Status: AC
  Administered 2024-01-12: 10 mg via ORAL
  Filled 2024-01-12: qty 1

## 2024-01-12 MED ORDER — TRASTUZUMAB-ANNS CHEMO 150 MG IV SOLR
2.0000 mg/kg | Freq: Once | INTRAVENOUS | Status: AC
  Administered 2024-01-12: 189 mg via INTRAVENOUS
  Filled 2024-01-12: qty 9

## 2024-01-12 MED ORDER — ACETAMINOPHEN 325 MG PO TABS
650.0000 mg | ORAL_TABLET | Freq: Once | ORAL | Status: AC
  Administered 2024-01-12: 650 mg via ORAL
  Filled 2024-01-12: qty 2

## 2024-01-12 MED ORDER — SODIUM CHLORIDE 0.9 % IV SOLN
INTRAVENOUS | Status: DC
Start: 2024-01-12 — End: 2024-01-12
  Filled 2024-01-12: qty 250

## 2024-01-12 MED ORDER — PACLITAXEL PROTEIN-BOUND CHEMO INJECTION 100 MG
80.0000 mg/m2 | Freq: Once | INTRAVENOUS | Status: AC
  Administered 2024-01-12: 175 mg via INTRAVENOUS
  Filled 2024-01-12: qty 35

## 2024-01-12 MED ORDER — HEPARIN SOD (PORK) LOCK FLUSH 100 UNIT/ML IV SOLN
500.0000 [IU] | Freq: Once | INTRAVENOUS | Status: AC | PRN
  Administered 2024-01-12: 500 [IU]
  Filled 2024-01-12: qty 5

## 2024-01-12 NOTE — Patient Instructions (Signed)
 [facility] - A DEPT OF [facility]  Discharge Instructions: Thank you for choosing [facility] to provide your oncology and hematology care.  If you have a lab appointment with the Cancer Center, please go directly to the Cancer Center and check in at the registration area.  Wear comfortable clothing and clothing appropriate for easy access to any Portacath or PICC line.   We strive to give you quality time with your provider. You may need to reschedule your appointment if you arrive late (15 or more minutes).  Arriving late affects you and other patients whose appointments are after yours.  Also, if you miss three or more appointments without notifying the office, you may be dismissed from the clinic at the provider's discretion.      For prescription refill requests, have your pharmacy contact our office and allow 72 hours for refills to be completed.    Today you received the following chemotherapy and/or immunotherapy agents kanjinti and abraxane      To help prevent nausea and vomiting after your treatment, we encourage you to take your nausea medication as directed.  BELOW ARE SYMPTOMS THAT SHOULD BE REPORTED IMMEDIATELY: *FEVER GREATER THAN 100.4 F (38 C) OR HIGHER *CHILLS OR SWEATING *NAUSEA AND VOMITING THAT IS NOT CONTROLLED WITH YOUR NAUSEA MEDICATION *UNUSUAL SHORTNESS OF BREATH *UNUSUAL BRUISING OR BLEEDING *URINARY PROBLEMS (pain or burning when urinating, or frequent urination) *BOWEL PROBLEMS (unusual diarrhea, constipation, pain near the anus) TENDERNESS IN MOUTH AND THROAT WITH OR WITHOUT PRESENCE OF ULCERS (sore throat, sores in mouth, or a toothache) UNUSUAL RASH, SWELLING OR PAIN  UNUSUAL VAGINAL DISCHARGE OR ITCHING   Items with * indicate a potential emergency and should be followed up as soon as possible or go to the Emergency Department if any problems should occur.  Please show the CHEMOTHERAPY ALERT CARD or  IMMUNOTHERAPY ALERT CARD at check-in to the Emergency Department and triage nurse.  Should you have questions after your visit or need to cancel or reschedule your appointment, please contact [facility] - A DEPT OF [facility]  [phone] and follow the prompts.  Office hours are 8:00 a.m. to 4:30 p.m. Monday - Friday. Please note that voicemails left after 4:00 p.m. may not be returned until the following business day.  We are closed weekends and major holidays. You have access to a nurse at all times for urgent questions. Please call the main number to the clinic [phone] and follow the prompts.  For any non-urgent questions, you may also contact your provider using MyChart. We now offer e-Visits for anyone 39 and older to request care online for non-urgent symptoms. For details visit [email or web address].   Also download the MyChart app! Go to the app store, search "MyChart", open the app, select [place], and log in with your MyChart username and password.

## 2024-01-13 ENCOUNTER — Inpatient Hospital Stay: Payer: Commercial Managed Care - PPO

## 2024-01-13 VITALS — BP 136/72

## 2024-01-13 DIAGNOSIS — C50312 Malignant neoplasm of lower-inner quadrant of left female breast: Secondary | ICD-10-CM

## 2024-01-13 DIAGNOSIS — Z5112 Encounter for antineoplastic immunotherapy: Secondary | ICD-10-CM | POA: Diagnosis not present

## 2024-01-13 MED ORDER — FILGRASTIM-SNDZ 480 MCG/0.8ML IJ SOSY
480.0000 ug | PREFILLED_SYRINGE | Freq: Every day | INTRAMUSCULAR | Status: DC
  Administered 2024-01-13: 480 ug via SUBCUTANEOUS
  Filled 2024-01-13: qty 0.8

## 2024-01-14 ENCOUNTER — Inpatient Hospital Stay: Payer: Commercial Managed Care - PPO

## 2024-01-14 DIAGNOSIS — Z5112 Encounter for antineoplastic immunotherapy: Secondary | ICD-10-CM | POA: Diagnosis not present

## 2024-01-14 DIAGNOSIS — Z171 Estrogen receptor negative status [ER-]: Secondary | ICD-10-CM

## 2024-01-14 MED ORDER — FILGRASTIM-SNDZ 480 MCG/0.8ML IJ SOSY
480.0000 ug | PREFILLED_SYRINGE | Freq: Every day | INTRAMUSCULAR | Status: DC
  Administered 2024-01-14: 480 ug via SUBCUTANEOUS
  Filled 2024-01-14: qty 0.8

## 2024-01-15 ENCOUNTER — Inpatient Hospital Stay: Payer: Commercial Managed Care - PPO

## 2024-01-15 DIAGNOSIS — C50312 Malignant neoplasm of lower-inner quadrant of left female breast: Secondary | ICD-10-CM

## 2024-01-15 DIAGNOSIS — Z5112 Encounter for antineoplastic immunotherapy: Secondary | ICD-10-CM | POA: Diagnosis not present

## 2024-01-15 MED ORDER — FILGRASTIM-SNDZ 480 MCG/0.8ML IJ SOSY
480.0000 ug | PREFILLED_SYRINGE | Freq: Every day | INTRAMUSCULAR | Status: DC
  Administered 2024-01-15: 480 ug via SUBCUTANEOUS
  Filled 2024-01-15: qty 0.8

## 2024-01-19 ENCOUNTER — Inpatient Hospital Stay: Payer: Commercial Managed Care - PPO

## 2024-01-19 ENCOUNTER — Ambulatory Visit: Payer: Commercial Managed Care - PPO

## 2024-01-19 ENCOUNTER — Encounter: Payer: Self-pay | Admitting: Oncology

## 2024-01-19 ENCOUNTER — Ambulatory Visit
Admission: RE | Admit: 2024-01-19 | Discharge: 2024-01-19 | Disposition: A | Discharge status: Home / Self Care | Payer: Commercial Managed Care - PPO | Source: Ambulatory Visit | Attending: Oncology | Admitting: Oncology

## 2024-01-19 ENCOUNTER — Inpatient Hospital Stay (HOSPITAL_BASED_OUTPATIENT_CLINIC_OR_DEPARTMENT_OTHER): Payer: Commercial Managed Care - PPO | Admitting: Oncology

## 2024-01-19 ENCOUNTER — Other Ambulatory Visit: Payer: Commercial Managed Care - PPO

## 2024-01-19 ENCOUNTER — Ambulatory Visit: Payer: Commercial Managed Care - PPO | Admitting: Oncology

## 2024-01-19 VITALS — BP 123/51 | HR 83 | Resp 18

## 2024-01-19 VITALS — BP 140/65 | HR 90 | Temp 96.9°F | Resp 19 | Wt 220.2 lb

## 2024-01-19 DIAGNOSIS — D701 Agranulocytosis secondary to cancer chemotherapy: Secondary | ICD-10-CM | POA: Diagnosis not present

## 2024-01-19 DIAGNOSIS — Z5112 Encounter for antineoplastic immunotherapy: Secondary | ICD-10-CM | POA: Diagnosis not present

## 2024-01-19 DIAGNOSIS — C50312 Malignant neoplasm of lower-inner quadrant of left female breast: Secondary | ICD-10-CM | POA: Diagnosis not present

## 2024-01-19 DIAGNOSIS — Z171 Estrogen receptor negative status [ER-]: Secondary | ICD-10-CM

## 2024-01-19 DIAGNOSIS — Z5111 Encounter for antineoplastic chemotherapy: Secondary | ICD-10-CM | POA: Diagnosis not present

## 2024-01-19 DIAGNOSIS — R0602 Shortness of breath: Secondary | ICD-10-CM | POA: Insufficient documentation

## 2024-01-19 DIAGNOSIS — T451X5A Adverse effect of antineoplastic and immunosuppressive drugs, initial encounter: Secondary | ICD-10-CM

## 2024-01-19 LAB — CMP (CANCER CENTER ONLY)
ALT: 35 U/L (ref 0–44)
AST: 41 U/L (ref 15–41)
Albumin: 3.4 g/dL — ABNORMAL LOW (ref 3.5–5.0)
Alkaline Phosphatase: 86 U/L (ref 38–126)
Anion gap: 10 (ref 5–15)
BUN: 13 mg/dL (ref 8–23)
CO2: 23 mmol/L (ref 22–32)
Calcium: 8.6 mg/dL — ABNORMAL LOW (ref 8.9–10.3)
Chloride: 107 mmol/L (ref 98–111)
Creatinine: 0.66 mg/dL (ref 0.44–1.00)
GFR, Estimated: >60 mL/min (ref >60)
Glucose, Bld: 235 mg/dL — ABNORMAL HIGH (ref 70–99)
Potassium: 3.4 mmol/L — ABNORMAL LOW (ref 3.5–5.1)
Sodium: 140 mmol/L (ref 135–145)
Total Bilirubin: 0.4 mg/dL (ref 0.0–1.2)
Total Protein: 6.4 g/dL — ABNORMAL LOW (ref 6.5–8.1)

## 2024-01-19 LAB — CBC WITH DIFFERENTIAL (CANCER CENTER ONLY)
Abs Immature Granulocytes: 0.63 10*3/uL — ABNORMAL HIGH (ref 0.00–0.07)
Basophils Absolute: 0 10*3/uL (ref 0.0–0.1)
Basophils Relative: 1 %
Eosinophils Absolute: 0.4 10*3/uL (ref 0.0–0.5)
Eosinophils Relative: 6 %
HCT: 32.3 % — ABNORMAL LOW (ref 36.0–46.0)
Hemoglobin: 10.5 g/dL — ABNORMAL LOW (ref 12.0–15.0)
Immature Granulocytes: 10 %
Lymphocytes Relative: 45 %
Lymphs Abs: 2.9 10*3/uL (ref 0.7–4.0)
MCH: 29.7 pg (ref 26.0–34.0)
MCHC: 32.5 g/dL (ref 30.0–36.0)
MCV: 91.2 fL (ref 80.0–100.0)
Monocytes Absolute: 0.5 10*3/uL (ref 0.1–1.0)
Monocytes Relative: 8 %
Neutro Abs: 1.9 10*3/uL (ref 1.7–7.7)
Neutrophils Relative %: 30 %
Platelet Count: 373 10*3/uL (ref 150–400)
RBC: 3.54 MIL/uL — ABNORMAL LOW (ref 3.87–5.11)
RDW: 14.6 % (ref 11.5–15.5)
Smear Review: NORMAL
WBC Count: 6.4 10*3/uL (ref 4.0–10.5)
nRBC: 0 % (ref 0.0–0.2)

## 2024-01-19 MED ORDER — SODIUM CHLORIDE 0.9 % IV SOLN
INTRAVENOUS | Status: DC
  Filled 2024-01-19: qty 250

## 2024-01-19 MED ORDER — HEPARIN SOD (PORK) LOCK FLUSH 100 UNIT/ML IV SOLN
500.0000 [IU] | Freq: Once | INTRAVENOUS | Status: AC | PRN
Start: 2024-01-19 — End: 2024-01-19
  Administered 2024-01-19: 500 [IU]
  Filled 2024-01-19: qty 5

## 2024-01-19 MED ORDER — ACETAMINOPHEN 325 MG PO TABS
650.0000 mg | ORAL_TABLET | Freq: Once | ORAL | Status: AC
  Administered 2024-01-19: 650 mg via ORAL
  Filled 2024-01-19: qty 2

## 2024-01-19 MED ORDER — TRASTUZUMAB-ANNS CHEMO 150 MG IV SOLR
2.0000 mg/kg | Freq: Once | INTRAVENOUS | Status: AC
  Administered 2024-01-19: 189 mg via INTRAVENOUS
  Filled 2024-01-19: qty 9

## 2024-01-19 MED ORDER — PROCHLORPERAZINE MALEATE 10 MG PO TABS
10.0000 mg | ORAL_TABLET | Freq: Once | ORAL | Status: AC
  Administered 2024-01-19: 10 mg via ORAL
  Filled 2024-01-19: qty 1

## 2024-01-19 MED ORDER — DIPHENHYDRAMINE HCL 50 MG/ML IJ SOLN
50.0000 mg | Freq: Once | INTRAMUSCULAR | Status: AC
  Administered 2024-01-19: 50 mg via INTRAVENOUS
  Filled 2024-01-19: qty 1

## 2024-01-19 MED ORDER — PACLITAXEL PROTEIN-BOUND CHEMO INJECTION 100 MG
80.0000 mg/m2 | Freq: Once | INTRAVENOUS | Status: AC
  Administered 2024-01-19: 175 mg via INTRAVENOUS
  Filled 2024-01-19: qty 35

## 2024-01-19 NOTE — Patient Instructions (Signed)
[facility]  Discharge Instructions: Thank you for choosing [facility] to provide your oncology and hematology care.  If you have a lab appointment with the Cancer Center, please go directly to the Cancer Center and check in at the registration area.  Wear comfortable clothing and clothing appropriate for easy access to any Portacath or PICC line.   We strive to give you quality time with your provider. You may need to reschedule your appointment if you arrive late (15 or more minutes).  Arriving late affects you and other patients whose appointments are after yours.  Also, if you miss three or more appointments without notifying the office, you may be dismissed from the clinic at the provider's discretion.      For prescription refill requests, have your pharmacy contact our office and allow 72 hours for refills to be completed.    Today you received the following chemotherapy and/or immunotherapy agents kanjinti and abraxane      To help prevent nausea and vomiting after your treatment, we encourage you to take your nausea medication as directed.  BELOW ARE SYMPTOMS THAT SHOULD BE REPORTED IMMEDIATELY: *FEVER GREATER THAN 100.4 F (38 C) OR HIGHER *CHILLS OR SWEATING *NAUSEA AND VOMITING THAT IS NOT CONTROLLED WITH YOUR NAUSEA MEDICATION *UNUSUAL SHORTNESS OF BREATH *UNUSUAL BRUISING OR BLEEDING *URINARY PROBLEMS (pain or burning when urinating, or frequent urination) *BOWEL PROBLEMS (unusual diarrhea, constipation, pain near the anus) TENDERNESS IN MOUTH AND THROAT WITH OR WITHOUT PRESENCE OF ULCERS (sore throat, sores in mouth, or a toothache) UNUSUAL RASH, SWELLING OR PAIN  UNUSUAL VAGINAL DISCHARGE OR ITCHING   Items with * indicate a potential emergency and should be followed up as soon as possible or go to the Emergency Department if any problems should occur.  Please show the CHEMOTHERAPY ALERT CARD or  IMMUNOTHERAPY ALERT CARD at check-in to the Emergency Department and triage nurse.  Should you have questions after your visit or need to cancel or reschedule your appointment, please contact [facility] - A DEPT OF [facility]  [phone] and follow the prompts.  Office hours are 8:00 a.m. to 4:30 p.m. Monday - Friday. Please note that voicemails left after 4:00 p.m. may not be returned until the following business day.  We are closed weekends and major holidays. You have access to a nurse at all times for urgent questions. Please call the main number to the clinic [phone] and follow the prompts.  For any non-urgent questions, you may also contact your provider using MyChart. We now offer e-Visits for anyone 67 and older to request care online for non-urgent symptoms. For details visit [email or web address].   Also download the MyChart app! Go to the app store, search "MyChart", open the app, select [place], and log in with your MyChart username and password.

## 2024-01-19 NOTE — Progress Notes (Signed)
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  Ultrasound of the left axilla demonstrated normal lymph nodes.  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  8 mm.  Tumor was ER/PR -0% and HER2 positive +3 by IHC.    Menarche at the age of 15.  She is G5, P5.  Age at first birth 47.  She has used birth control pills in the past.  She attained menopause in her 7s.  Family history significant for breast cancer in 2 sisters of her maternal grandmother.  Mom with cervical cancer in her 70s.  Paternal uncle with stomach cancer.  Her sister has been diagnosed with some form of skin cancer.   Left lumpectomy pathology from 11/04/2023 showed 10 mm grade 3 tumor with negative margins.  3 sentinel lymph nodes negative for malignancy.  Tumor was ER/PR negative and HER2 positive  Interval history- She reports exertion/shortness of breath when she walks a block.  She has also gained weight in the interim.  She  reports nighttime wheezing and nasal congestion for the last few weeks  ECOG PS- 1 Pain scale- 0   Review of systems- Review of Systems  Constitutional:  Positive for malaise/fatigue. Negative for chills, fever and weight loss.  HENT:  Negative for congestion, ear discharge and nosebleeds.   Eyes:  Negative for blurred vision.  Respiratory:  Positive for shortness of breath. Negative for cough, hemoptysis, sputum production and wheezing.   Cardiovascular:  Negative for chest pain, palpitations, orthopnea and claudication.  Gastrointestinal:  Negative for abdominal pain, blood in stool, constipation, diarrhea, heartburn, melena, nausea and vomiting.  Genitourinary:  Negative for dysuria, flank pain, frequency, hematuria and urgency.  Musculoskeletal:  Negative for back pain, joint pain and myalgias.  Skin:  Negative for rash.  Neurological:  Negative for dizziness, tingling, focal weakness, seizures, weakness and headaches.  Endo/Heme/Allergies:  Does not bruise/bleed easily.  Psychiatric/Behavioral:  Negative for depression and suicidal ideas. The patient does not have insomnia.       Allergies  Allergen Reactions   Paclitaxel Shortness Of Breath, Other (See Comments) and Hypertension    Redness to face/chest Back Pain     Flagyl [Metronidazole] Itching     Past Medical History:  Diagnosis Date   Adenoma of appendix    Anemia    Chronic lower back pain    GERD (gastroesophageal reflux disease)    H/O gestational diabetes mellitus, not currently pregnant    History of kidney stones    Hypercholesteremia    Hyperplastic rectal polyp  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, estrogen receptor negative (HCC)    Obesity    Ovarian cyst    Palpitations    Sigmoid diverticulitis 08/2012   Subclinical hypothyroidism    SVT (supraventricular tachycardia) (HCC)    on Holter monitor 5 beat run of svt   Thyroid nodule    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   Type 2  diabetes mellitus (HCC)      Past Surgical History:  Procedure Laterality Date   AXILLARY SENTINEL NODE BIOPSY Left 11/04/2023   Procedure: AXILLARY SENTINEL NODE BIOPSY;  Surgeon: Campbell Lerner, MD;  Location: [facility] ORS;  Service: General;  Laterality: Left;   BLADDER SUSPENSION  2009   BREAST BIOPSY Left 10/22/2023   [place] Core Bx, heart clip - path pending   BREAST BIOPSY Left 10/22/2023   [place] LT BREAST BX W LOC DEV 1ST LESION IMG BX SPEC US GUIDE 10/22/2023 [facility]   BREAST LUMPECTOMY WITH RADIOFREQUENCY TAG IDENTIFICATION Left 11/04/2023   Procedure: BREAST LUMPECTOMY WITH RADIOFREQUENCY TAG IDENTIFICATION;  Surgeon: Campbell Lerner, MD;  Location: [facility] ORS;  Service: General;  Laterality: Left;   Colectomy Left    likely sigmoid, open.  for diverticulitis.   COLONOSCOPY WITH PROPOFOL N/A 10/01/2022   Procedure: COLONOSCOPY WITH PROPOFOL;  Surgeon: Toney Reil, MD;  Location: [facility] ENDOSCOPY;  Service: Gastroenterology;  Laterality: N/A;   PORTACATH PLACEMENT Right 11/04/2023   Procedure: INSERTION PORT-A-CATH;  Surgeon: Campbell Lerner, MD;  Location: [facility] ORS;  Service: General;  Laterality: Right;   TUBAL LIGATION     XI ROBOTIC LAPAROSCOPIC ASSISTED APPENDECTOMY N/A 12/29/2022   Procedure: XI ROBOTIC LYSIS OF ADHESIONS AND APPENDECTOMY;  Surgeon: Campbell Lerner, MD;  Location: [facility] ORS;  Service: General;  Laterality: N/A;    Social History   Socioeconomic History   Marital status: Single    Spouse name: Not on file   Number of children: Not on file   Years of education: Not on file   Highest education level: Some college, no degree  Occupational History   Not on file  Tobacco Use   Smoking status: Former    Current packs/day: 0.00    Types: Cigarettes, Cigars    Quit date: 09/19/2011    Years since quitting: 12.3   Smokeless tobacco: Never  Vaping Use   Vaping status: Former  Substance and Sexual Activity   Alcohol use: Not Currently     Comment: Occasional glass of wine   Drug use: No   Sexual activity: Not Currently  Other Topics Concern   Not on file  Social History Narrative   Works in Audiological scientist at [employer].   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   Social Drivers of Corporate investment banker Strain: Low Risk  (06/17/2023)   Overall Financial Resource Strain (CARDIA)    Difficulty of Paying Living Expenses: Not very hard  Food Insecurity: No Food Insecurity (10/28/2023)   Hunger Vital Sign    Worried About Running Out of Food in the Last Year: Never true    Ran Out of Food in the Last Year: Never true  Transportation Needs: No Transportation Needs (10/28/2023)   PRAPARE - Administrator, Civil Service (Medical): No    Lack of Transportation (Non-Medical): No  Physical Activity: Sufficiently Active (06/17/2023)   Exercise Vital Sign    Days of Exercise per Week: 7 days    Minutes of Exercise per Session: 60 min  Stress: Stress Concern  Present (06/17/2023)   [employer] of Occupational Health - Occupational Stress Questionnaire    Feeling of Stress : To some extent  Social Connections: Moderately Integrated (06/17/2023)   Social Connection and Isolation Panel [NHANES]    Frequency of Communication with Friends and Family: More than three times a week    Frequency of Social Gatherings with Friends and Family: Once a week    Attends Religious Services: More than 4 times per year    Active Member of [employer] or Organizations: Yes    Attends Banker Meetings: More than 4 times per year    Marital Status: Divorced  Intimate Partner Violence: Not At Risk (10/28/2023)   Humiliation, Afraid, Rape, and Kick questionnaire    Fear of Current or Ex-Partner: No    Emotionally Abused: No    Physically Abused: No    Sexually Abused: No    Family History  Problem Relation Age of Onset   Cancer - Cervical Mother        dx 10s; Passed away from Kidney Issues (Not Cancer)    Heart disease Father    Kidney disease Father    Diabetes Father    Skin cancer Sister    Stomach cancer Paternal Uncle    Diabetes Maternal Grandmother    Skin cancer Maternal Grandfather    Diabetes Paternal Grandmother    Breast cancer Other        mat great aunts x2; unk age of dx   Lung cancer Cousin    Brain cancer Cousin      Current Outpatient Medications:    acetaminophen (TYLENOL) 500 MG tablet, Take 500 mg by mouth every 6 (six) hours as needed., Disp: , Rfl:    atenolol (TENORMIN) 25 MG tablet, TAKE 1 TABLET (25 MG TOTAL) BY MOUTH DAILY. FOR PALPITATIONS. (Patient taking differently: Take 25 mg by mouth at bedtime. For palpitations.), Disp: 90 tablet, Rfl: 2   azithromycin (ZITHROMAX Z-PAK) 250 MG tablet, 500mg  once for 1 day, then 250mg  once daily for 4 days, Disp: 6 each, Rfl: 0   Biotin 1000 MCG tablet, Take 1,000 mcg by mouth daily., Disp: , Rfl:    blood glucose meter kit and supplies KIT, Dispense based on patient and insurance preference. Use up to four times daily as directed. (FOR ICD-9 250.00, 250.01)., Disp: 1 each, Rfl: 0   cyanocobalamin 500 MCG tablet, Take 1,000 mcg by mouth daily. Reported on 06/12/2016, Disp: , Rfl:    docusate sodium (COLACE) 100 MG capsule, Take 200 mg by mouth daily as needed for moderate constipation. Reported on 06/12/2016, Disp: , Rfl:    ibuprofen (ADVIL) 800 MG tablet, Take 1 tablet (800 mg total) by mouth every 8 (eight) hours as needed., Disp: 30 tablet, Rfl: 0   lidocaine-prilocaine (EMLA) cream, Apply to affected area once, Disp: 30 g, Rfl: 3   magnesium 30 MG tablet, Take 30 mg by mouth daily as needed., Disp: , Rfl:    metFORMIN (GLUCOPHAGE-XR) 500 MG 24 hr tablet, TAKE 1 TABLET (500 MG TOTAL) BY MOUTH DAILY WITH BREAKFAST. FOR DIABETES., Disp: 90 tablet, Rfl: 0   ondansetron (ZOFRAN) 8 MG tablet, Take 1 tablet (8 mg total) by mouth every 8 (eight) hours as needed for nausea or vomiting., Disp: 30 tablet, Rfl: 1   prochlorperazine  (COMPAZINE) 10 MG tablet, Take 1 tablet (10 mg total) by mouth every 6 (six) hours as needed for nausea or vomiting., Disp: 30 tablet, Rfl: 1  pyridOXINE (VITAMIN B6) 25 MG tablet, Take 25 mg by mouth daily., Disp: , Rfl:    rosuvastatin (CRESTOR) 5 MG tablet, TAKE 1 TABLET (5 MG TOTAL) BY MOUTH EVERY EVENING. FOR CHOLESTEROL., Disp: 90 tablet, Rfl: 2 No current facility-administered medications for this visit.  Facility-Administered Medications Ordered in Other Visits:    0.9 %  sodium chloride infusion, , Intravenous, Continuous, Creig Hines, MD, Stopped at 11/27/23 1158  Physical exam:  Vitals:   01/19/24 0911  BP: (!) 140/65  Pulse: 90  Resp: 19  Temp: (!) 96.9 F (36.1 C)  SpO2: 98%  Weight: 220 lb 3.2 oz (99.9 kg)   Physical Exam Cardiovascular:     Rate and Rhythm: Normal rate and regular rhythm.     Heart sounds: Normal heart sounds.  Pulmonary:     Effort: Pulmonary effort is normal.     Breath sounds: Normal breath sounds.  Abdominal:     General: Bowel sounds are normal.     Palpations: Abdomen is soft.  Musculoskeletal:     Right lower leg: No edema.     Left lower leg: No edema.  Skin:    General: Skin is warm and dry.  Neurological:     Mental Status: She is alert and oriented to person, place, and time.         Latest Ref Rng & Units 01/19/2024    8:55 AM  CMP  Glucose 70 - 99 mg/dL 409   BUN 8 - 23 mg/dL 13   Creatinine 8.11 - 1.00 mg/dL 9.14   Sodium 782 - 956 mmol/L 140   Potassium 3.5 - 5.1 mmol/L 3.4   Chloride 98 - 111 mmol/L 107   CO2 22 - 32 mmol/L 23   Calcium 8.9 - 10.3 mg/dL 8.6   Total Protein 6.5 - 8.1 g/dL 6.4   Total Bilirubin 0.0 - 1.2 mg/dL 0.4   Alkaline Phos 38 - 126 U/L 86   AST 15 - 41 U/L 41   ALT 0 - 44 U/L 35       Latest Ref Rng & Units 01/19/2024    8:55 AM  CBC  WBC 4.0 - 10.5 K/uL 6.4   Hemoglobin 12.0 - 15.0 g/dL 21.3   Hematocrit 08.6 - 46.0 % 32.3   Platelets 150 - 400 K/uL 373     No images are  attached to the encounter.  No results found.   Assessment and plan- Patient is a 63 y.o. female   with history of pathological prognostic stage Ia invasive mammary carcinoma of the left breast pT1b N0 M0 grade 3 ER/PR negative and HER2 positive.  She is here for on treatment assessment prior to cycle 6 of weekly Abraxane and Herceptin  White cell count is 6.4 today with an ANC of 1.9.  She will receive Abraxane and Herceptin today and Zarzio 3 doses starting tomorrow.  Plan is to complete 12 weekly cycles.  Chemo induced neutropenia: Plan for Zarzio as above.  Chemo-induced anemia: Stable continue to monitor  Shortness of breath: On clinical exam her lungs abnormal breath sounds.  I am getting a chest x-ray today and if it is unremarkable I will consider repeating echocardiogram.  She was due to get one done next month as a part of Herceptin monitoring.   Visit Diagnosis 1. 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 in female, estrogen receptor negative (HCC)   2. Shortness of breath   3. Encounter for antineoplastic chemotherapy  4. Chemotherapy induced neutropenia (HCC)      Dr. Owens Shark, MD, MPH [facility] at [facility] [phone] 01/19/2024 12:40 PM

## 2024-01-19 NOTE — Patient Instructions (Signed)

## 2024-01-20 ENCOUNTER — Encounter: Payer: Self-pay | Admitting: Oncology

## 2024-01-20 ENCOUNTER — Other Ambulatory Visit: Payer: Self-pay

## 2024-01-20 ENCOUNTER — Encounter: Payer: Self-pay | Admitting: *Deleted

## 2024-01-20 ENCOUNTER — Inpatient Hospital Stay: Payer: Commercial Managed Care - PPO

## 2024-01-20 DIAGNOSIS — Z171 Estrogen receptor negative status [ER-]: Secondary | ICD-10-CM

## 2024-01-20 DIAGNOSIS — Z5112 Encounter for antineoplastic immunotherapy: Secondary | ICD-10-CM | POA: Diagnosis not present

## 2024-01-20 MED ORDER — FILGRASTIM-SNDZ 480 MCG/0.8ML IJ SOSY
480.0000 ug | PREFILLED_SYRINGE | Freq: Every day | INTRAMUSCULAR | Status: DC
  Administered 2024-01-20: 480 ug via SUBCUTANEOUS
  Filled 2024-01-20: qty 0.8

## 2024-01-21 ENCOUNTER — Other Ambulatory Visit: Payer: Self-pay

## 2024-01-21 ENCOUNTER — Inpatient Hospital Stay: Payer: Commercial Managed Care - PPO

## 2024-01-21 DIAGNOSIS — Z171 Estrogen receptor negative status [ER-]: Secondary | ICD-10-CM

## 2024-01-21 DIAGNOSIS — Z5112 Encounter for antineoplastic immunotherapy: Secondary | ICD-10-CM | POA: Diagnosis not present

## 2024-01-21 MED ORDER — FILGRASTIM-SNDZ 480 MCG/0.8ML IJ SOSY
480.0000 ug | PREFILLED_SYRINGE | Freq: Every day | INTRAMUSCULAR | Status: DC
Start: 2024-01-21 — End: 2024-01-21
  Administered 2024-01-21: 480 ug via SUBCUTANEOUS
  Filled 2024-01-21: qty 0.8

## 2024-01-22 ENCOUNTER — Inpatient Hospital Stay: Payer: Commercial Managed Care - PPO

## 2024-01-22 DIAGNOSIS — Z171 Estrogen receptor negative status [ER-]: Secondary | ICD-10-CM

## 2024-01-22 DIAGNOSIS — Z5112 Encounter for antineoplastic immunotherapy: Secondary | ICD-10-CM | POA: Diagnosis not present

## 2024-01-22 MED ORDER — FILGRASTIM-SNDZ 480 MCG/0.8ML IJ SOSY
480.0000 ug | PREFILLED_SYRINGE | Freq: Every day | INTRAMUSCULAR | Status: DC
  Administered 2024-01-22: 480 ug via SUBCUTANEOUS
  Filled 2024-01-22: qty 0.8

## 2024-01-26 ENCOUNTER — Inpatient Hospital Stay: Payer: Commercial Managed Care - PPO

## 2024-01-26 VITALS — BP 150/77 | HR 86 | Temp 96.3°F | Resp 18 | Ht 65.0 in | Wt 219.4 lb

## 2024-01-26 DIAGNOSIS — Z171 Estrogen receptor negative status [ER-]: Secondary | ICD-10-CM

## 2024-01-26 DIAGNOSIS — Z5112 Encounter for antineoplastic immunotherapy: Secondary | ICD-10-CM | POA: Diagnosis not present

## 2024-01-26 LAB — CBC WITH DIFFERENTIAL (CANCER CENTER ONLY)
Abs Immature Granulocytes: 0.79 10*3/uL — ABNORMAL HIGH (ref 0.00–0.07)
Basophils Absolute: 0.2 10*3/uL — ABNORMAL HIGH (ref 0.0–0.1)
Basophils Relative: 4 %
Eosinophils Absolute: 0.2 10*3/uL (ref 0.0–0.5)
Eosinophils Relative: 3 %
HCT: 33.4 % — ABNORMAL LOW (ref 36.0–46.0)
Hemoglobin: 11.2 g/dL — ABNORMAL LOW (ref 12.0–15.0)
Immature Granulocytes: 11 %
Lymphocytes Relative: 45 %
Lymphs Abs: 3.1 10*3/uL (ref 0.7–4.0)
MCH: 30.7 pg (ref 26.0–34.0)
MCHC: 33.5 g/dL (ref 30.0–36.0)
MCV: 91.5 fL (ref 80.0–100.0)
Monocytes Absolute: 0.6 10*3/uL (ref 0.1–1.0)
Monocytes Relative: 8 %
Neutro Abs: 2 10*3/uL (ref 1.7–7.7)
Neutrophils Relative %: 29 %
Platelet Count: 407 10*3/uL — ABNORMAL HIGH (ref 150–400)
RBC: 3.65 MIL/uL — ABNORMAL LOW (ref 3.87–5.11)
RDW: 15.2 % (ref 11.5–15.5)
Smear Review: NORMAL
WBC Count: 6.9 10*3/uL (ref 4.0–10.5)
nRBC: 0 % (ref 0.0–0.2)

## 2024-01-26 LAB — CMP (CANCER CENTER ONLY)
ALT: 36 U/L (ref 0–44)
AST: 46 U/L — ABNORMAL HIGH (ref 15–41)
Albumin: 3.5 g/dL (ref 3.5–5.0)
Alkaline Phosphatase: 89 U/L (ref 38–126)
Anion gap: 9 (ref 5–15)
BUN: 9 mg/dL (ref 8–23)
CO2: 24 mmol/L (ref 22–32)
Calcium: 8.8 mg/dL — ABNORMAL LOW (ref 8.9–10.3)
Chloride: 103 mmol/L (ref 98–111)
Creatinine: 0.68 mg/dL (ref 0.44–1.00)
GFR, Estimated: >60 mL/min (ref >60)
Glucose, Bld: 235 mg/dL — ABNORMAL HIGH (ref 70–99)
Potassium: 3.5 mmol/L (ref 3.5–5.1)
Sodium: 136 mmol/L (ref 135–145)
Total Bilirubin: 0.6 mg/dL (ref 0.0–1.2)
Total Protein: 6.6 g/dL (ref 6.5–8.1)

## 2024-01-26 MED ORDER — SODIUM CHLORIDE 0.9 % IV SOLN
INTRAVENOUS | Status: DC
  Filled 2024-01-26: qty 250

## 2024-01-26 MED ORDER — TRASTUZUMAB-ANNS CHEMO 150 MG IV SOLR
2.0000 mg/kg | Freq: Once | INTRAVENOUS | Status: AC
  Administered 2024-01-26: 189 mg via INTRAVENOUS
  Filled 2024-01-26: qty 9

## 2024-01-26 MED ORDER — HEPARIN SOD (PORK) LOCK FLUSH 100 UNIT/ML IV SOLN
500.0000 [IU] | Freq: Once | INTRAVENOUS | Status: AC | PRN
Start: 2024-01-26 — End: 2024-01-26
  Administered 2024-01-26: 500 [IU]
  Filled 2024-01-26: qty 5

## 2024-01-26 MED ORDER — DIPHENHYDRAMINE HCL 50 MG/ML IJ SOLN
50.0000 mg | Freq: Once | INTRAMUSCULAR | Status: AC
  Administered 2024-01-26: 50 mg via INTRAVENOUS
  Filled 2024-01-26: qty 1

## 2024-01-26 MED ORDER — ACETAMINOPHEN 325 MG PO TABS
650.0000 mg | ORAL_TABLET | Freq: Once | ORAL | Status: AC
  Administered 2024-01-26: 650 mg via ORAL
  Filled 2024-01-26: qty 2

## 2024-01-26 MED ORDER — PROCHLORPERAZINE MALEATE 10 MG PO TABS
10.0000 mg | ORAL_TABLET | Freq: Once | ORAL | Status: AC
  Administered 2024-01-26: 10 mg via ORAL
  Filled 2024-01-26: qty 1

## 2024-01-26 MED ORDER — PACLITAXEL PROTEIN-BOUND CHEMO INJECTION 100 MG
80.0000 mg/m2 | Freq: Once | INTRAVENOUS | Status: AC
  Administered 2024-01-26: 175 mg via INTRAVENOUS
  Filled 2024-01-26: qty 35

## 2024-01-26 NOTE — Patient Instructions (Signed)
 [facility] - A DEPT OF [facility]  Discharge Instructions: Thank you for choosing [facility] to provide your oncology and hematology care.  If you have a lab appointment with the Cancer Center, please go directly to the Cancer Center and check in at the registration area.  Wear comfortable clothing and clothing appropriate for easy access to any Portacath or PICC line.   We strive to give you quality time with your provider. You may need to reschedule your appointment if you arrive late (15 or more minutes).  Arriving late affects you and other patients whose appointments are after yours.  Also, if you miss three or more appointments without notifying the office, you may be dismissed from the clinic at the provider's discretion.      For prescription refill requests, have your pharmacy contact our office and allow 72 hours for refills to be completed.    Today you received the following chemotherapy and/or immunotherapy agents KANJINIT and ABRAXENE       To help prevent nausea and vomiting after your treatment, we encourage you to take your nausea medication as directed.  BELOW ARE SYMPTOMS THAT SHOULD BE REPORTED IMMEDIATELY: *FEVER GREATER THAN 100.4 F (38 C) OR HIGHER *CHILLS OR SWEATING *NAUSEA AND VOMITING THAT IS NOT CONTROLLED WITH YOUR NAUSEA MEDICATION *UNUSUAL SHORTNESS OF BREATH *UNUSUAL BRUISING OR BLEEDING *URINARY PROBLEMS (pain or burning when urinating, or frequent urination) *BOWEL PROBLEMS (unusual diarrhea, constipation, pain near the anus) TENDERNESS IN MOUTH AND THROAT WITH OR WITHOUT PRESENCE OF ULCERS (sore throat, sores in mouth, or a toothache) UNUSUAL RASH, SWELLING OR PAIN  UNUSUAL VAGINAL DISCHARGE OR ITCHING   Items with * indicate a potential emergency and should be followed up as soon as possible or go to the Emergency Department if any problems should occur.  Please show the CHEMOTHERAPY ALERT CARD or  IMMUNOTHERAPY ALERT CARD at check-in to the Emergency Department and triage nurse.  Should you have questions after your visit or need to cancel or reschedule your appointment, please contact [facility] - A DEPT OF [facility]  [phone] and follow the prompts.  Office hours are 8:00 a.m. to 4:30 p.m. Monday - Friday. Please note that voicemails left after 4:00 p.m. may not be returned until the following business day.  We are closed weekends and major holidays. You have access to a nurse at all times for urgent questions. Please call the main number to the clinic [phone] and follow the prompts.  For any non-urgent questions, you may also contact your provider using MyChart. We now offer e-Visits for anyone 31 and older to request care online for non-urgent symptoms. For details visit [email or web address].   Also download the MyChart app! Go to the app store, search "MyChart", open the app, select [facility], and log in with your MyChart username and password.  Trastuzumab Injection What is this medication? TRASTUZUMAB (tras TOO zoo mab) treats breast cancer and stomach cancer. It works by blocking a protein that causes cancer cells to grow and multiply. This helps to slow or stop the spread of cancer cells. This medicine may be used for other purposes; ask your health care provider or pharmacist if you have questions. COMMON BRAND NAME(S): Herceptin, HERCESSI, Herzuma, KANJINTI, Ogivri, Ontruzant, Trazimera What should I tell my care team before I take this medication? They need to know if you have any of these conditions: Heart  failure Lung disease An unusual or allergic reaction to trastuzumab, other medications, foods, dyes, or preservatives Pregnant or trying to get pregnant Breast-feeding How should I use this medication? This medication is injected into a vein. It is given by your care team in a hospital or clinic setting. Talk to your care  team about the use of this medication in children. It is not approved for use in children. Overdosage: If you think you have taken too much of this medicine contact a poison control center or emergency room at once. NOTE: This medicine is only for you. Do not share this medicine with others. What if I miss a dose? Keep appointments for follow-up doses. It is important not to miss your dose. Call your care team if you are unable to keep an appointment. What may interact with this medication? Certain types of chemotherapy, such as daunorubicin, doxorubicin, epirubicin, idarubicin This list may not describe all possible interactions. Give your health care provider a list of all the medicines, herbs, non-prescription drugs, or dietary supplements you use. Also tell them if you smoke, drink alcohol, or use illegal drugs. Some items may interact with your medicine. What should I watch for while using this medication? Your condition will be monitored carefully while you are receiving this medication. This medication may make you feel generally unwell. This is not uncommon, as chemotherapy affects healthy cells as well as cancer cells. Report any side effects. Continue your course of treatment even though you feel ill unless your care team tells you to stop. This medication may increase your risk of getting an infection. Call your care team for advice if you get a fever, chills, sore throat, or other symptoms of a cold or flu. Do not treat yourself. Try to avoid being around people who are sick. Avoid taking medications that contain aspirin, acetaminophen, ibuprofen, naproxen, or ketoprofen unless instructed by your care team. These medications can hide a fever. Talk to your care team if you may be pregnant. Serious birth defects can occur if you take this medication during pregnancy and for 7 months after the last dose. You will need a negative pregnancy test before starting this medication. Contraception is  recommended while taking this medication and for 7 months after the last dose. Your care team can help you find the option that works for you. Do not breastfeed while taking this medication and for 7 months after stopping treatment. What side effects may I notice from receiving this medication? Side effects that you should report to your care team as soon as possible: Allergic reactions or angioedema--skin rash, itching or hives, swelling of the face, eyes, lips, tongue, arms, or legs, trouble swallowing or breathing Dry cough, shortness of breath or trouble breathing Heart failure--shortness of breath, swelling of the ankles, feet, or hands, sudden weight gain, unusual weakness or fatigue Infection--fever, chills, cough, or sore throat Infusion reactions--chest pain, shortness of breath or trouble breathing, feeling faint or lightheaded Side effects that usually do not require medical attention (report to your care team if they continue or are bothersome): Diarrhea Dizziness Headache Nausea Trouble sleeping Vomiting This list may not describe all possible side effects. Call your doctor for medical advice about side effects. You may report side effects to FDA at 1-800-FDA-1088. Where should I keep my medication? This medication is given in a hospital or clinic. It will not be stored at home. NOTE: This sheet is a summary. It may not cover all possible information. If you have  questions about this medicine, talk to your doctor, pharmacist, or health care provider.  2024 Elsevier/Gold Standard (2022-04-08 00:00:00)  Paclitaxel Nanoparticle Albumin-Bound Injection What is this medication? NANOPARTICLE ALBUMIN-BOUND PACLITAXEL (Na no PAHR ti kuhl al BYOO muhn-bound PAK li TAX el) treats some types of cancer. It works by slowing down the growth of cancer cells. This medicine may be used for other purposes; ask your health care provider or pharmacist if you have questions. COMMON BRAND NAME(S):  Abraxane What should I tell my care team before I take this medication? They need to know if you have any of these conditions: Liver disease Low white blood cell levels An unusual or allergic reaction to paclitaxel, albumin, other medications, foods, dyes, or preservatives If you or your partner are pregnant or trying to get pregnant Breast-feeding How should I use this medication? This medication is injected into a vein. It is given by your care team in a hospital or clinic setting. Talk to your care team about the use of this medication in children. Special care may be needed. Overdosage: If you think you have taken too much of this medicine contact a poison control center or emergency room at once. NOTE: This medicine is only for you. Do not share this medicine with others. What if I miss a dose? Keep appointments for follow-up doses. It is important not to miss your dose. Call your care team if you are unable to keep an appointment. What may interact with this medication? Other medications may affect the way this medication works. Talk with your care team about all of the medications you take. They may suggest changes to your treatment plan to lower the risk of side effects and to make sure your medications work as intended. This list may not describe all possible interactions. Give your health care provider a list of all the medicines, herbs, non-prescription drugs, or dietary supplements you use. Also tell them if you smoke, drink alcohol, or use illegal drugs. Some items may interact with your medicine. What should I watch for while using this medication? Your condition will be monitored carefully while you are receiving this medication. You may need blood work while taking this medication. This medication may make you feel generally unwell. This is not uncommon as chemotherapy can affect healthy cells as well as cancer cells. Report any side effects. Continue your course of treatment even  though you feel ill unless your care team tells you to stop. This medication can cause serious allergic reactions. To reduce the risk, your care team may give you other medications to take before receiving this one. Be sure to follow the directions from your care team. This medication may increase your risk of getting an infection. Call your care team for advice if you get a fever, chills, sore throat, or other symptoms of a cold or flu. Do not treat yourself. Try to avoid being around people who are sick. This medication may increase your risk to bruise or bleed. Call your care team if you notice any unusual bleeding. Be careful brushing or flossing your teeth or using a toothpick because you may get an infection or bleed more easily. If you have any dental work done, tell your dentist you are receiving this medication. Talk to your care team if you or your partner may be pregnant. Serious birth defects can occur if you take this medication during pregnancy and for 6 months after the last dose. You will need a negative pregnancy test before  starting this medication. Contraception is recommended while taking this medication and for 6 months after the last dose. Your care team can help you find the option that works for you. If your partner can get pregnant, use a condom during sex while taking this medication and for 3 months after the last dose. Do not breastfeed while taking this medication and for 2 weeks after the last dose. This medication may cause infertility. Talk to your care team if you are concerned about your fertility. What side effects may I notice from receiving this medication? Side effects that you should report to your care team as soon as possible: Allergic reactions--skin rash, itching, hives, swelling of the face, lips, tongue, or throat Dry cough, shortness of breath or trouble breathing Infection--fever, chills, cough, sore throat, wounds that don't heal, pain or trouble when  passing urine, general feeling of discomfort or being unwell Low red blood cell level--unusual weakness or fatigue, dizziness, headache, trouble breathing Pain, tingling, or numbness in the hands or feet Stomach pain, unusual weakness or fatigue, nausea, vomiting, diarrhea, or fever that lasts longer than expected Unusual bruising or bleeding Side effects that usually do not require medical attention (report to your care team if they continue or are bothersome): Diarrhea Fatigue Hair loss Loss of appetite Nausea Vomiting This list may not describe all possible side effects. Call your doctor for medical advice about side effects. You may report side effects to FDA at 1-800-FDA-1088. Where should I keep my medication? This medication is given in a hospital or clinic. It will not be stored at home. NOTE: This sheet is a summary. It may not cover all possible information. If you have questions about this medicine, talk to your doctor, pharmacist, or health care provider.  2024 Elsevier/Gold Standard (2022-04-10 00:00:00)

## 2024-02-02 ENCOUNTER — Encounter: Payer: Self-pay | Admitting: Oncology

## 2024-02-02 ENCOUNTER — Inpatient Hospital Stay: Payer: Commercial Managed Care - PPO

## 2024-02-02 ENCOUNTER — Inpatient Hospital Stay (HOSPITAL_BASED_OUTPATIENT_CLINIC_OR_DEPARTMENT_OTHER): Payer: Commercial Managed Care - PPO | Admitting: Oncology

## 2024-02-02 VITALS — BP 132/60 | HR 87 | Temp 97.5°F | Resp 19 | Wt 221.2 lb

## 2024-02-02 DIAGNOSIS — L270 Generalized skin eruption due to drugs and medicaments taken internally: Secondary | ICD-10-CM | POA: Diagnosis not present

## 2024-02-02 DIAGNOSIS — Z171 Estrogen receptor negative status [ER-]: Secondary | ICD-10-CM | POA: Diagnosis not present

## 2024-02-02 DIAGNOSIS — Z5111 Encounter for antineoplastic chemotherapy: Secondary | ICD-10-CM

## 2024-02-02 DIAGNOSIS — C50312 Malignant neoplasm of lower-inner quadrant of left female breast: Secondary | ICD-10-CM

## 2024-02-02 DIAGNOSIS — Z95828 Presence of other vascular implants and grafts: Secondary | ICD-10-CM

## 2024-02-02 DIAGNOSIS — D701 Agranulocytosis secondary to cancer chemotherapy: Secondary | ICD-10-CM

## 2024-02-02 DIAGNOSIS — T451X5A Adverse effect of antineoplastic and immunosuppressive drugs, initial encounter: Secondary | ICD-10-CM

## 2024-02-02 DIAGNOSIS — Z5112 Encounter for antineoplastic immunotherapy: Secondary | ICD-10-CM | POA: Diagnosis not present

## 2024-02-02 LAB — CBC WITH DIFFERENTIAL (CANCER CENTER ONLY)
Abs Immature Granulocytes: 0.04 10*3/uL (ref 0.00–0.07)
Basophils Absolute: 0.1 10*3/uL (ref 0.0–0.1)
Basophils Relative: 3 %
Eosinophils Absolute: 0 10*3/uL (ref 0.0–0.5)
Eosinophils Relative: 1 %
HCT: 29.2 % — ABNORMAL LOW (ref 36.0–46.0)
Hemoglobin: 9.7 g/dL — ABNORMAL LOW (ref 12.0–15.0)
Immature Granulocytes: 1 %
Lymphocytes Relative: 65 %
Lymphs Abs: 2 10*3/uL (ref 0.7–4.0)
MCH: 30.5 pg (ref 26.0–34.0)
MCHC: 33.2 g/dL (ref 30.0–36.0)
MCV: 91.8 fL (ref 80.0–100.0)
Monocytes Absolute: 0.2 10*3/uL (ref 0.1–1.0)
Monocytes Relative: 7 %
Neutro Abs: 0.7 10*3/uL — ABNORMAL LOW (ref 1.7–7.7)
Neutrophils Relative %: 23 %
Platelet Count: 365 10*3/uL (ref 150–400)
RBC: 3.18 MIL/uL — ABNORMAL LOW (ref 3.87–5.11)
RDW: 15.1 % (ref 11.5–15.5)
WBC Count: 3.1 10*3/uL — ABNORMAL LOW (ref 4.0–10.5)
nRBC: 0 % (ref 0.0–0.2)

## 2024-02-02 LAB — CMP (CANCER CENTER ONLY)
ALT: 33 U/L (ref 0–44)
AST: 49 U/L — ABNORMAL HIGH (ref 15–41)
Albumin: 3.2 g/dL — ABNORMAL LOW (ref 3.5–5.0)
Alkaline Phosphatase: 57 U/L (ref 38–126)
Anion gap: 6 (ref 5–15)
BUN: 13 mg/dL (ref 8–23)
CO2: 24 mmol/L (ref 22–32)
Calcium: 8.4 mg/dL — ABNORMAL LOW (ref 8.9–10.3)
Chloride: 108 mmol/L (ref 98–111)
Creatinine: 0.72 mg/dL (ref 0.44–1.00)
GFR, Estimated: >60 mL/min (ref >60)
Glucose, Bld: 226 mg/dL — ABNORMAL HIGH (ref 70–99)
Potassium: 3.2 mmol/L — ABNORMAL LOW (ref 3.5–5.1)
Sodium: 138 mmol/L (ref 135–145)
Total Bilirubin: 0.6 mg/dL (ref 0.0–1.2)
Total Protein: 6 g/dL — ABNORMAL LOW (ref 6.5–8.1)

## 2024-02-02 MED ORDER — FILGRASTIM-SNDZ 480 MCG/0.8ML IJ SOSY
480.0000 ug | PREFILLED_SYRINGE | Freq: Every day | INTRAMUSCULAR | Status: DC
  Administered 2024-02-02: 480 ug via SUBCUTANEOUS
  Filled 2024-02-02: qty 0.8

## 2024-02-02 MED ORDER — HEPARIN SOD (PORK) LOCK FLUSH 100 UNIT/ML IV SOLN
500.0000 [IU] | Freq: Once | INTRAVENOUS | Status: AC
  Administered 2024-02-02: 500 [IU] via INTRAVENOUS
  Filled 2024-02-02: qty 5

## 2024-02-02 NOTE — Progress Notes (Signed)
 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  Ultrasound of the left axilla demonstrated normal lymph nodes.  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  8 mm.  Tumor was ER/PR -0% and HER2 positive +3 by IHC.    Menarche at the age of 36.  She is G5, P5.  Age at first birth 83.  She has used birth control pills in the past.  She attained menopause in her 81s.  Family history significant for breast cancer in 2 sisters of her maternal grandmother.  Mom with cervical cancer in her 38s.  Paternal uncle with stomach cancer.  Her sister has been diagnosed with some form of skin cancer.   Left lumpectomy pathology from 11/04/2023 showed 10 mm grade 3 tumor with negative margins.  3 sentinel lymph nodes negative for malignancy.  Tumor was ER/PR negative and HER2 positive  Interval history-patient has noticed an erythematous rash over her face and especially her eyelids.  Her eyelids feel swollen and itchy.   She has been using as needed Benadryl as well as emollient cream over the face.  Has remained more or less the same in the last 1 week without much change  ECOG PS- 1 Pain scale- 0   Review of systems- Review of Systems  Constitutional:  Negative for chills, fever, malaise/fatigue and weight loss.  HENT:  Negative for congestion, ear discharge and nosebleeds.   Eyes:  Negative for blurred vision.  Respiratory:  Negative for cough, hemoptysis, sputum production, shortness of breath and wheezing.   Cardiovascular:  Negative for chest pain, palpitations, orthopnea and claudication.  Gastrointestinal:  Negative for abdominal pain, blood in stool, constipation, diarrhea, heartburn, melena, nausea and vomiting.  Genitourinary:  Negative for dysuria, flank pain, frequency, hematuria and urgency.  Musculoskeletal:  Negative for back pain, joint pain and myalgias.  Skin:  Positive for rash.  Neurological:  Negative for dizziness, tingling, focal weakness, seizures, weakness and headaches.  Endo/Heme/Allergies:  Does not bruise/bleed easily.  Psychiatric/Behavioral:  Negative for depression and suicidal ideas. The patient does not have insomnia.       Allergies  Allergen Reactions   Paclitaxel Shortness Of Breath, Other (See Comments) and Hypertension    Redness to face/chest Back Pain     Flagyl [Metronidazole] Itching     Past Medical History:  Diagnosis Date   Adenoma of appendix    Anemia    Chronic lower back pain    GERD (gastroesophageal reflux disease)    H/O gestational diabetes mellitus, not currently pregnant    History  of kidney stones    Hypercholesteremia    Hyperplastic rectal polyp    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, estrogen receptor negative (HCC)    Obesity    Ovarian cyst    Palpitations    Sigmoid diverticulitis 08/2012   Subclinical hypothyroidism    SVT (supraventricular tachycardia) (HCC)    on Holter monitor 5 beat run of svt    Thyroid nodule    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   Type 2 diabetes mellitus (HCC)      Past Surgical History:  Procedure Laterality Date   AXILLARY SENTINEL NODE BIOPSY Left 11/04/2023   Procedure: AXILLARY SENTINEL NODE BIOPSY;  Surgeon: Campbell Lerner, MD;  Location: [facility] ORS;  Service: General;  Laterality: Left;   BLADDER SUSPENSION  2009   BREAST BIOPSY Left 10/22/2023   [place] Core Bx, heart clip - path pending   BREAST BIOPSY Left 10/22/2023   [place] LT BREAST BX W LOC DEV 1ST LESION IMG BX SPEC US GUIDE 10/22/2023 [facility]   BREAST LUMPECTOMY WITH RADIOFREQUENCY TAG IDENTIFICATION Left 11/04/2023   Procedure: BREAST LUMPECTOMY WITH RADIOFREQUENCY TAG IDENTIFICATION;  Surgeon: Campbell Lerner, MD;  Location: [facility] ORS;  Service: General;  Laterality: Left;   Colectomy Left    likely sigmoid, open.  for diverticulitis.   COLONOSCOPY WITH PROPOFOL N/A 10/01/2022   Procedure: COLONOSCOPY WITH PROPOFOL;  Surgeon: Toney Reil, MD;  Location: [facility] ENDOSCOPY;  Service: Gastroenterology;  Laterality: N/A;   PORTACATH PLACEMENT Right 11/04/2023   Procedure: INSERTION PORT-A-CATH;  Surgeon: Campbell Lerner, MD;  Location: [facility] ORS;  Service: General;  Laterality: Right;   TUBAL LIGATION     XI ROBOTIC LAPAROSCOPIC ASSISTED APPENDECTOMY N/A 12/29/2022   Procedure: XI ROBOTIC LYSIS OF ADHESIONS AND APPENDECTOMY;  Surgeon: Campbell Lerner, MD;  Location: [facility] ORS;  Service: General;  Laterality: N/A;    Social History   Socioeconomic History   Marital status: Single    Spouse name: Not on file   Number of children: Not on file   Years of education: Not on file   Highest education level: Some college, no degree  Occupational History   Not on file  Tobacco Use   Smoking status: Former    Current packs/day: 0.00    Types: Cigarettes, Cigars    Quit date: 09/19/2011    Years since quitting: 12.3   Smokeless tobacco: Never  Vaping Use   Vaping status: Former  Substance  and Sexual Activity   Alcohol use: Not Currently    Comment: Occasional glass of wine   Drug use: No   Sexual activity: Not Currently  Other Topics Concern   Not on file  Social History Narrative   Works in Audiological scientist at [employer].   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   Social Drivers of Corporate investment banker Strain: Low Risk  (06/17/2023)   Overall Financial Resource Strain (CARDIA)    Difficulty of Paying Living Expenses: Not very hard  Food Insecurity: No Food Insecurity (10/28/2023)   Hunger Vital Sign    Worried About Running Out of Food in the Last Year: Never true    Ran Out of Food in the Last Year: Never true  Transportation Needs: No Transportation Needs (10/28/2023)   PRAPARE - Administrator, Civil Service (Medical): No    Lack of Transportation (Non-Medical): No  Physical Activity: Sufficiently Active (06/17/2023)   Exercise Vital Sign    Days of Exercise per Week:  7 days    Minutes of Exercise per Session: 60 min  Stress: Stress Concern Present (06/17/2023)   [employer] of Occupational Health - Occupational Stress Questionnaire    Feeling of Stress : To some extent  Social Connections: Moderately Integrated (06/17/2023)   Social Connection and Isolation Panel [NHANES]    Frequency of Communication with Friends and Family: More than three times a week    Frequency of Social Gatherings with Friends and Family: Once a week    Attends Religious Services: More than 4 times per year    Active Member of [employer] or Organizations: Yes    Attends Banker Meetings: More than 4 times per year    Marital Status: Divorced  Intimate Partner Violence: Not At Risk (10/28/2023)   Humiliation, Afraid, Rape, and Kick questionnaire    Fear of Current or Ex-Partner: No    Emotionally Abused: No    Physically Abused: No    Sexually Abused: No    Family History  Problem Relation Age of Onset   Cancer - Cervical Mother        dx  1s; Passed away from Kidney Issues (Not Cancer)   Heart disease Father    Kidney disease Father    Diabetes Father    Skin cancer Sister    Stomach cancer Paternal Uncle    Diabetes Maternal Grandmother    Skin cancer Maternal Grandfather    Diabetes Paternal Grandmother    Breast cancer Other        mat great aunts x2; unk age of dx   Lung cancer Cousin    Brain cancer Cousin      Current Outpatient Medications:    acetaminophen (TYLENOL) 500 MG tablet, Take 500 mg by mouth every 6 (six) hours as needed., Disp: , Rfl:    atenolol (TENORMIN) 25 MG tablet, TAKE 1 TABLET (25 MG TOTAL) BY MOUTH DAILY. FOR PALPITATIONS. (Patient taking differently: Take 25 mg by mouth at bedtime. For palpitations.), Disp: 90 tablet, Rfl: 2   azithromycin (ZITHROMAX Z-PAK) 250 MG tablet, 500mg  once for 1 day, then 250mg  once daily for 4 days, Disp: 6 each, Rfl: 0   Biotin 1000 MCG tablet, Take 1,000 mcg by mouth daily., Disp: , Rfl:    blood glucose meter kit and supplies KIT, Dispense based on patient and insurance preference. Use up to four times daily as directed. (FOR ICD-9 250.00, 250.01)., Disp: 1 each, Rfl: 0   cyanocobalamin 500 MCG tablet, Take 1,000 mcg by mouth daily. Reported on 06/12/2016, Disp: , Rfl:    docusate sodium (COLACE) 100 MG capsule, Take 200 mg by mouth daily as needed for moderate constipation. Reported on 06/12/2016, Disp: , Rfl:    ibuprofen (ADVIL) 800 MG tablet, Take 1 tablet (800 mg total) by mouth every 8 (eight) hours as needed., Disp: 30 tablet, Rfl: 0   lidocaine-prilocaine (EMLA) cream, Apply to affected area once, Disp: 30 g, Rfl: 3   magnesium 30 MG tablet, Take 30 mg by mouth daily as needed., Disp: , Rfl:    metFORMIN (GLUCOPHAGE-XR) 500 MG 24 hr tablet, TAKE 1 TABLET (500 MG TOTAL) BY MOUTH DAILY WITH BREAKFAST. FOR DIABETES., Disp: 90 tablet, Rfl: 0   ondansetron (ZOFRAN) 8 MG tablet, Take 1 tablet (8 mg total) by mouth every 8 (eight) hours as needed for nausea or  vomiting., Disp: 30 tablet, Rfl: 1   prochlorperazine (COMPAZINE) 10 MG tablet, Take 1 tablet (10 mg total) by mouth every  6 (six) hours as needed for nausea or vomiting., Disp: 30 tablet, Rfl: 1   pyridOXINE (VITAMIN B6) 25 MG tablet, Take 25 mg by mouth daily., Disp: , Rfl:    rosuvastatin (CRESTOR) 5 MG tablet, TAKE 1 TABLET (5 MG TOTAL) BY MOUTH EVERY EVENING. FOR CHOLESTEROL., Disp: 90 tablet, Rfl: 2 No current facility-administered medications for this visit.  Facility-Administered Medications Ordered in Other Visits:    0.9 %  sodium chloride infusion, , Intravenous, Continuous, Creig Hines, MD, Stopped at 11/27/23 1158   filgrastim-sndz (ZARXIO) injection 480 mcg, 480 mcg, Subcutaneous, Daily, Creig Hines, MD, 480 mcg at 02/02/24 1005  Physical exam:  Vitals:   02/02/24 0906  BP: 132/60  Pulse: 87  Resp: 19  Temp: (!) 97.5 F (36.4 C)  TempSrc: Tympanic  SpO2: 98%  Weight: 221 lb 3.2 oz (100.3 kg)   Physical Exam HENT:     Head:     Comments: There is a diffuse mildly erythematous macular rash seen over her entire face.  Bilateral upper eyelids appear mildly swollen Cardiovascular:     Rate and Rhythm: Normal rate and regular rhythm.     Heart sounds: Normal heart sounds.  Pulmonary:     Effort: Pulmonary effort is normal.     Breath sounds: Normal breath sounds.  Abdominal:     General: Bowel sounds are normal.     Palpations: Abdomen is soft.  Skin:    General: Skin is warm and dry.  Neurological:     Mental Status: She is alert and oriented to person, place, and time.         Latest Ref Rng & Units 02/02/2024    8:48 AM  CMP  Glucose 70 - 99 mg/dL 621   BUN 8 - 23 mg/dL 13   Creatinine 3.08 - 1.00 mg/dL 6.57   Sodium 846 - 962 mmol/L 138   Potassium 3.5 - 5.1 mmol/L 3.2   Chloride 98 - 111 mmol/L 108   CO2 22 - 32 mmol/L 24   Calcium 8.9 - 10.3 mg/dL 8.4   Total Protein 6.5 - 8.1 g/dL 6.0   Total Bilirubin 0.0 - 1.2 mg/dL 0.6   Alkaline Phos 38  - 126 U/L 57   AST 15 - 41 U/L 49   ALT 0 - 44 U/L 33       Latest Ref Rng & Units 02/02/2024    8:48 AM  CBC  WBC 4.0 - 10.5 K/uL 3.1   Hemoglobin 12.0 - 15.0 g/dL 9.7   Hematocrit 95.2 - 46.0 % 29.2   Platelets 150 - 400 K/uL 365     No images are attached to the encounter.  DG Chest 1 View Result Date: 01/20/2024 CLINICAL DATA:  Shortness of breath x2 weeks, left breast cancer EXAM: CHEST  1 VIEW COMPARISON:  11/04/2023 FINDINGS: Mild left basilar opacity, favoring scarring when correlating with the prior. Right lung is clear. No pleural effusion or pneumothorax. The heart is normal in size. Right chest port terminates at the cavoatrial junction. IMPRESSION: Mild left basilar opacity, favoring scarring when correlating with the prior. No acute cardiopulmonary disease. Electronically Signed   By: Charline Bills M.D.   On: 01/20/2024 00:31     Assessment and plan- Patient is a 63 y.o. 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  She is here for on treatment assessment  prior to cycle 8 of weekly Abraxane and Herceptin  White count is 3.1 today with an ANC of 0.7.  She will not be getting treatment today.  3 days of Zarzio starting today.  She will directly proceed for cycle 8 and cycle 9 of treatment in 1 week in 2 weeks and I will see her back in 3 weeks for cycle 10  Patient will be receiving Zarzio with every weekly chemotherapy cycle.  Plan is to complete total 12 cycles.  Chemo-induced anemia: Stable continue to monitor  Drug-induced skin rash over the face possibly secondary to Abraxane.  She is not getting treatments this week.  We discussed trial of oral steroids should the rash get worse.  Continue conservative management at this time with emollients and as needed Benadryl   Visit Diagnosis 1. 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 in female,  estrogen receptor negative (HCC)   2. Encounter for antineoplastic chemotherapy   3. Chemotherapy induced neutropenia (HCC)      Dr. Owens Shark, MD, MPH [facility] at [facility] [phone] 02/02/2024 12:52 PM

## 2024-02-02 NOTE — Patient Instructions (Signed)
 Filgrastim Injection What is this medication? FILGRASTIM (fil GRA stim) lowers the risk of infection in people who are receiving chemotherapy. It works by Systems analyst make more white blood cells, which protects your body from infection. It may also be used to help people who have been exposed to high doses of radiation. It can be used to help prepare your body before a stem cell transplant. It works by helping your bone marrow make and release stem cells into the blood. This medicine may be used for other purposes; ask your health care provider or pharmacist if you have questions. COMMON BRAND NAME(S): Neupogen, Nivestym, Nypozi, Releuko, Zarxio What should I tell my care team before I take this medication? They need to know if you have any of these conditions: History of blood diseases, such as sickle cell anemia Kidney disease Recent or ongoing radiation An unusual or allergic reaction to filgrastim, pegfilgrastim, latex, rubber, other medications, foods, dyes, or preservatives Pregnant or trying to get pregnant Breast-feeding How should I use this medication? This medication is injected under the skin or into a vein. It is usually given by your care team in a hospital or clinic setting. It may be given at home. If you get this medication at home, you will be taught how to prepare and give it. Use exactly as directed. Take it as directed on the prescription label at the same time every day. Keep taking it unless your care team tells you to stop. It is important that you put your used needles and syringes in a special sharps container. Do not put them in a trash can. If you do not have a sharps container, call your pharmacist or care team to get one. This medication comes with INSTRUCTIONS FOR USE. Ask your pharmacist for directions on how to use this medication. Read the information carefully. Talk to your pharmacist or care team if you have questions. Talk to your care team about the use of  this medication in children. While it may be prescribed for children for selected conditions, precautions do apply. Overdosage: If you think you have taken too much of this medicine contact a poison control center or emergency room at once. NOTE: This medicine is only for you. Do not share this medicine with others. What if I miss a dose? It is important not to miss any doses. Talk to your care team about what to do if you miss a dose. What may interact with this medication? Medications that may cause a release of neutrophils, such as lithium This list may not describe all possible interactions. Give your health care provider a list of all the medicines, herbs, non-prescription drugs, or dietary supplements you use. Also tell them if you smoke, drink alcohol, or use illegal drugs. Some items may interact with your medicine. What should I watch for while using this medication? Your condition will be monitored carefully while you are receiving this medication. You may need bloodwork while taking this medication. Talk to your care team about your risk of cancer. You may be more at risk for certain types of cancer if you take this medication. What side effects may I notice from receiving this medication? Side effects that you should report to your care team as soon as possible: Allergic reactions--skin rash, itching, hives, swelling of the face, lips, tongue, or throat Capillary leak syndrome--stomach or muscle pain, unusual weakness or fatigue, feeling faint or lightheaded, decrease in the amount of urine, swelling of the ankles, hands,  or feet, trouble breathing High white blood cell level--fever, fatigue, trouble breathing, night sweats, change in vision, weight loss Inflammation of the aorta--fever, fatigue, back, chest, or stomach pain, severe headache Kidney injury (glomerulonephritis)--decrease in the amount of urine, red or dark brown urine, foamy or bubbly urine, swelling of the ankles, hands,  or feet Shortness of breath or trouble breathing Spleen injury--pain in upper left stomach or shoulder Unusual bruising or bleeding Side effects that usually do not require medical attention (report to your care team if they continue or are bothersome): Back pain Bone pain Fatigue Fever Headache Nausea This list may not describe all possible side effects. Call your doctor for medical advice about side effects. You may report side effects to FDA at 1-800-FDA-1088. Where should I keep my medication? Keep out of the reach of children and pets. Keep this medication in the original packaging until you are ready to take it. Protect from light. See product for storage information. Each product may have different instructions. Get rid of any unused medication after the expiration date. To get rid of medications that are no longer needed or have expired: Take the medication to a medications take-back program. Check with your pharmacy or law enforcement to find a location. If you cannot return the medication, ask your pharmacist or care team how to get rid of this medication safely. NOTE: This sheet is a summary. It may not cover all possible information. If you have questions about this medicine, talk to your doctor, pharmacist, or health care provider.  2024 Elsevier/Gold Standard (2022-04-17 00:00:00)

## 2024-02-03 ENCOUNTER — Other Ambulatory Visit: Payer: Self-pay

## 2024-02-03 ENCOUNTER — Inpatient Hospital Stay: Payer: Commercial Managed Care - PPO

## 2024-02-03 DIAGNOSIS — C50312 Malignant neoplasm of lower-inner quadrant of left female breast: Secondary | ICD-10-CM

## 2024-02-03 DIAGNOSIS — Z5112 Encounter for antineoplastic immunotherapy: Secondary | ICD-10-CM | POA: Diagnosis not present

## 2024-02-03 MED ORDER — FILGRASTIM-SNDZ 480 MCG/0.8ML IJ SOSY
480.0000 ug | PREFILLED_SYRINGE | Freq: Every day | INTRAMUSCULAR | Status: DC
  Administered 2024-02-03: 480 ug via SUBCUTANEOUS
  Filled 2024-02-03: qty 0.8

## 2024-02-04 ENCOUNTER — Inpatient Hospital Stay: Payer: Commercial Managed Care - PPO

## 2024-02-04 DIAGNOSIS — C50312 Malignant neoplasm of lower-inner quadrant of left female breast: Secondary | ICD-10-CM

## 2024-02-04 DIAGNOSIS — Z5112 Encounter for antineoplastic immunotherapy: Secondary | ICD-10-CM | POA: Diagnosis not present

## 2024-02-04 MED ORDER — FILGRASTIM-SNDZ 480 MCG/0.8ML IJ SOSY
480.0000 ug | PREFILLED_SYRINGE | Freq: Every day | INTRAMUSCULAR | Status: DC
  Administered 2024-02-04: 480 ug via SUBCUTANEOUS
  Filled 2024-02-04: qty 0.8

## 2024-02-05 ENCOUNTER — Telehealth: Payer: Self-pay

## 2024-02-05 ENCOUNTER — Encounter: Payer: Self-pay | Admitting: Oncology

## 2024-02-05 NOTE — Telephone Encounter (Signed)
 Called patient for clarification on the [employer] Disability.  This request is for the continues absence from work during initial work up and beginning of treatment.  The continuous date started on 11/04/23 to 02/04/24 (returned to work full time yesterday).    Intermittent leave already completed on 12/08/23 with copy available in chart.    Will fax completed form to [employer] and place copy with copy of intermittent form downstairs for patient to pick up at next appt.

## 2024-02-05 NOTE — Telephone Encounter (Signed)
 Disability form completed and faxed.  Copy downstairs for patient to pick up at next visit.

## 2024-02-09 ENCOUNTER — Encounter: Payer: Self-pay | Admitting: Oncology

## 2024-02-09 ENCOUNTER — Inpatient Hospital Stay: Payer: Commercial Managed Care - PPO | Attending: Oncology

## 2024-02-09 ENCOUNTER — Inpatient Hospital Stay: Payer: Commercial Managed Care - PPO

## 2024-02-09 ENCOUNTER — Inpatient Hospital Stay (HOSPITAL_BASED_OUTPATIENT_CLINIC_OR_DEPARTMENT_OTHER): Payer: Commercial Managed Care - PPO | Admitting: Oncology

## 2024-02-09 ENCOUNTER — Other Ambulatory Visit: Payer: Commercial Managed Care - PPO

## 2024-02-09 ENCOUNTER — Other Ambulatory Visit: Payer: Self-pay

## 2024-02-09 ENCOUNTER — Ambulatory Visit: Payer: Commercial Managed Care - PPO

## 2024-02-09 VITALS — BP 160/59 | HR 90 | Temp 97.7°F | Resp 17 | Wt 232.0 lb

## 2024-02-09 DIAGNOSIS — C50312 Malignant neoplasm of lower-inner quadrant of left female breast: Secondary | ICD-10-CM

## 2024-02-09 DIAGNOSIS — Z171 Estrogen receptor negative status [ER-]: Secondary | ICD-10-CM | POA: Diagnosis not present

## 2024-02-09 DIAGNOSIS — Z1722 Progesterone receptor negative status: Secondary | ICD-10-CM | POA: Diagnosis not present

## 2024-02-09 DIAGNOSIS — Z1731 Human epidermal growth factor receptor 2 positive status: Secondary | ICD-10-CM | POA: Diagnosis not present

## 2024-02-09 DIAGNOSIS — R609 Edema, unspecified: Secondary | ICD-10-CM

## 2024-02-09 DIAGNOSIS — Z5111 Encounter for antineoplastic chemotherapy: Secondary | ICD-10-CM

## 2024-02-09 DIAGNOSIS — Z79899 Other long term (current) drug therapy: Secondary | ICD-10-CM | POA: Insufficient documentation

## 2024-02-09 DIAGNOSIS — M7989 Other specified soft tissue disorders: Secondary | ICD-10-CM

## 2024-02-09 DIAGNOSIS — Z5112 Encounter for antineoplastic immunotherapy: Secondary | ICD-10-CM | POA: Diagnosis present

## 2024-02-09 DIAGNOSIS — Z5189 Encounter for other specified aftercare: Secondary | ICD-10-CM | POA: Diagnosis not present

## 2024-02-09 DIAGNOSIS — Z95828 Presence of other vascular implants and grafts: Secondary | ICD-10-CM

## 2024-02-09 LAB — CMP (CANCER CENTER ONLY)
ALT: 28 U/L (ref 0–44)
AST: 41 U/L (ref 15–41)
Albumin: 3.1 g/dL — ABNORMAL LOW (ref 3.5–5.0)
Alkaline Phosphatase: 81 U/L (ref 38–126)
Anion gap: 9 (ref 5–15)
BUN: 9 mg/dL (ref 8–23)
CO2: 23 mmol/L (ref 22–32)
Calcium: 8.4 mg/dL — ABNORMAL LOW (ref 8.9–10.3)
Chloride: 107 mmol/L (ref 98–111)
Creatinine: 0.63 mg/dL (ref 0.44–1.00)
GFR, Estimated: >60 mL/min (ref >60)
Glucose, Bld: 188 mg/dL — ABNORMAL HIGH (ref 70–99)
Potassium: 3.3 mmol/L — ABNORMAL LOW (ref 3.5–5.1)
Sodium: 139 mmol/L (ref 135–145)
Total Bilirubin: 0.7 mg/dL (ref 0.0–1.2)
Total Protein: 6.1 g/dL — ABNORMAL LOW (ref 6.5–8.1)

## 2024-02-09 LAB — CBC WITH DIFFERENTIAL (CANCER CENTER ONLY)
Abs Immature Granulocytes: 0.99 10*3/uL — ABNORMAL HIGH (ref 0.00–0.07)
Basophils Absolute: 0.2 10*3/uL — ABNORMAL HIGH (ref 0.0–0.1)
Basophils Relative: 3 %
Eosinophils Absolute: 0 10*3/uL (ref 0.0–0.5)
Eosinophils Relative: 1 %
HCT: 32 % — ABNORMAL LOW (ref 36.0–46.0)
Hemoglobin: 10.4 g/dL — ABNORMAL LOW (ref 12.0–15.0)
Immature Granulocytes: 12 %
Lymphocytes Relative: 35 %
Lymphs Abs: 2.9 10*3/uL (ref 0.7–4.0)
MCH: 29.7 pg (ref 26.0–34.0)
MCHC: 32.5 g/dL (ref 30.0–36.0)
MCV: 91.4 fL (ref 80.0–100.0)
Monocytes Absolute: 1.5 10*3/uL — ABNORMAL HIGH (ref 0.1–1.0)
Monocytes Relative: 19 %
Neutro Abs: 2.5 10*3/uL (ref 1.7–7.7)
Neutrophils Relative %: 30 %
Platelet Count: 333 10*3/uL (ref 150–400)
RBC: 3.5 MIL/uL — ABNORMAL LOW (ref 3.87–5.11)
RDW: 17 % — ABNORMAL HIGH (ref 11.5–15.5)
Smear Review: NORMAL
WBC Count: 8.2 10*3/uL (ref 4.0–10.5)
nRBC: 0.7 % — ABNORMAL HIGH (ref 0.0–0.2)

## 2024-02-09 MED ORDER — HEPARIN SOD (PORK) LOCK FLUSH 100 UNIT/ML IV SOLN
500.0000 [IU] | Freq: Once | INTRAVENOUS | Status: AC | PRN
  Administered 2024-02-09: 500 [IU]
  Filled 2024-02-09: qty 5

## 2024-02-09 MED ORDER — ACETAMINOPHEN 325 MG PO TABS
650.0000 mg | ORAL_TABLET | Freq: Once | ORAL | Status: AC
  Administered 2024-02-09: 650 mg via ORAL
  Filled 2024-02-09: qty 2

## 2024-02-09 MED ORDER — DIPHENHYDRAMINE HCL 50 MG/ML IJ SOLN
50.0000 mg | Freq: Once | INTRAMUSCULAR | Status: AC
  Administered 2024-02-09: 50 mg via INTRAVENOUS
  Filled 2024-02-09: qty 1

## 2024-02-09 MED ORDER — FUROSEMIDE 20 MG PO TABS: 20.0000 mg | ORAL_TABLET | Freq: Every day | ORAL | 0 refills | Status: DC

## 2024-02-09 MED ORDER — SODIUM CHLORIDE 0.9 % IV SOLN
INTRAVENOUS | Status: DC
  Filled 2024-02-09: qty 250

## 2024-02-09 MED ORDER — POTASSIUM CHLORIDE 20 MEQ/100ML IV SOLN
20.0000 meq | Freq: Once | INTRAVENOUS | Status: AC
  Administered 2024-02-09: 20 meq via INTRAVENOUS

## 2024-02-09 MED ORDER — PROCHLORPERAZINE MALEATE 10 MG PO TABS
10.0000 mg | ORAL_TABLET | Freq: Once | ORAL | Status: AC
Start: 2024-02-09 — End: 2024-02-09
  Administered 2024-02-09: 10 mg via ORAL
  Filled 2024-02-09: qty 1

## 2024-02-09 MED ORDER — PACLITAXEL PROTEIN-BOUND CHEMO INJECTION 100 MG
80.0000 mg/m2 | Freq: Once | INTRAVENOUS | Status: AC
  Administered 2024-02-09: 175 mg via INTRAVENOUS
  Filled 2024-02-09: qty 35

## 2024-02-09 MED ORDER — TRASTUZUMAB-ANNS CHEMO 150 MG IV SOLR
2.0000 mg/kg | Freq: Once | INTRAVENOUS | Status: AC
  Administered 2024-02-09: 189 mg via INTRAVENOUS
  Filled 2024-02-09: qty 9

## 2024-02-09 MED ORDER — POTASSIUM CHLORIDE CRYS ER 20 MEQ PO TBCR: 20.0000 meq | EXTENDED_RELEASE_TABLET | Freq: Two times a day (BID) | ORAL | 0 refills | Status: DC

## 2024-02-09 NOTE — Progress Notes (Signed)
 Patient here for oncology follow-up appointment, concerns of feet swelling

## 2024-02-09 NOTE — Patient Instructions (Signed)
 [facility] - A DEPT OF [facility]  Discharge Instructions: Thank you for choosing [facility] to provide your oncology and hematology care.  If you have a lab appointment with the Cancer Center, please go directly to the Cancer Center and check in at the registration area.  Wear comfortable clothing and clothing appropriate for easy access to any Portacath or PICC line.   We strive to give you quality time with your provider. You may need to reschedule your appointment if you arrive late (15 or more minutes).  Arriving late affects you and other patients whose appointments are after yours.  Also, if you miss three or more appointments without notifying the office, you may be dismissed from the clinic at the provider's discretion.      For prescription refill requests, have your pharmacy contact our office and allow 72 hours for refills to be completed.    Today you received the following chemotherapy and/or immunotherapy agents KANJINTI, ABARXENE, POTASSIUM CHLORIDE      To help prevent nausea and vomiting after your treatment, we encourage you to take your nausea medication as directed.  BELOW ARE SYMPTOMS THAT SHOULD BE REPORTED IMMEDIATELY: *FEVER GREATER THAN 100.4 F (38 C) OR HIGHER *CHILLS OR SWEATING *NAUSEA AND VOMITING THAT IS NOT CONTROLLED WITH YOUR NAUSEA MEDICATION *UNUSUAL SHORTNESS OF BREATH *UNUSUAL BRUISING OR BLEEDING *URINARY PROBLEMS (pain or burning when urinating, or frequent urination) *BOWEL PROBLEMS (unusual diarrhea, constipation, pain near the anus) TENDERNESS IN MOUTH AND THROAT WITH OR WITHOUT PRESENCE OF ULCERS (sore throat, sores in mouth, or a toothache) UNUSUAL RASH, SWELLING OR PAIN  UNUSUAL VAGINAL DISCHARGE OR ITCHING   Items with * indicate a potential emergency and should be followed up as soon as possible or go to the Emergency Department if any problems should occur.  Please show the CHEMOTHERAPY  ALERT CARD or IMMUNOTHERAPY ALERT CARD at check-in to the Emergency Department and triage nurse.  Should you have questions after your visit or need to cancel or reschedule your appointment, please contact [facility] - A DEPT OF [facility]  [phone] and follow the prompts.  Office hours are 8:00 a.m. to 4:30 p.m. Monday - Friday. Please note that voicemails left after 4:00 p.m. may not be returned until the following business day.  We are closed weekends and major holidays. You have access to a nurse at all times for urgent questions. Please call the main number to the clinic [phone] and follow the prompts.  For any non-urgent questions, you may also contact your provider using MyChart. We now offer e-Visits for anyone 47 and older to request care online for non-urgent symptoms. For details visit [email or web address].   Also download the MyChart app! Go to the app store, search "MyChart", open the app, select [place], and log in with your MyChart username and password.  Trastuzumab Injection What is this medication? TRASTUZUMAB (tras TOO zoo mab) treats breast cancer and stomach cancer. It works by blocking a protein that causes cancer cells to grow and multiply. This helps to slow or stop the spread of cancer cells. This medicine may be used for other purposes; ask your health care provider or pharmacist if you have questions. COMMON BRAND NAME(S): Herceptin, HERCESSI, Herzuma, KANJINTI, Ogivri, Ontruzant, Trazimera What should I tell my care team before I take this medication? They need to know if you have any of these conditions: Heart  failure Lung disease An unusual or allergic reaction to trastuzumab, other medications, foods, dyes, or preservatives Pregnant or trying to get pregnant Breast-feeding How should I use this medication? This medication is injected into a vein. It is given by your care team in a hospital or clinic setting. Talk  to your care team about the use of this medication in children. It is not approved for use in children. Overdosage: If you think you have taken too much of this medicine contact a poison control center or emergency room at once. NOTE: This medicine is only for you. Do not share this medicine with others. What if I miss a dose? Keep appointments for follow-up doses. It is important not to miss your dose. Call your care team if you are unable to keep an appointment. What may interact with this medication? Certain types of chemotherapy, such as daunorubicin, doxorubicin, epirubicin, idarubicin This list may not describe all possible interactions. Give your health care provider a list of all the medicines, herbs, non-prescription drugs, or dietary supplements you use. Also tell them if you smoke, drink alcohol, or use illegal drugs. Some items may interact with your medicine. What should I watch for while using this medication? Your condition will be monitored carefully while you are receiving this medication. This medication may make you feel generally unwell. This is not uncommon, as chemotherapy affects healthy cells as well as cancer cells. Report any side effects. Continue your course of treatment even though you feel ill unless your care team tells you to stop. This medication may increase your risk of getting an infection. Call your care team for advice if you get a fever, chills, sore throat, or other symptoms of a cold or flu. Do not treat yourself. Try to avoid being around people who are sick. Avoid taking medications that contain aspirin, acetaminophen, ibuprofen, naproxen, or ketoprofen unless instructed by your care team. These medications can hide a fever. Talk to your care team if you may be pregnant. Serious birth defects can occur if you take this medication during pregnancy and for 7 months after the last dose. You will need a negative pregnancy test before starting this medication.  Contraception is recommended while taking this medication and for 7 months after the last dose. Your care team can help you find the option that works for you. Do not breastfeed while taking this medication and for 7 months after stopping treatment. What side effects may I notice from receiving this medication? Side effects that you should report to your care team as soon as possible: Allergic reactions or angioedema--skin rash, itching or hives, swelling of the face, eyes, lips, tongue, arms, or legs, trouble swallowing or breathing Dry cough, shortness of breath or trouble breathing Heart failure--shortness of breath, swelling of the ankles, feet, or hands, sudden weight gain, unusual weakness or fatigue Infection--fever, chills, cough, or sore throat Infusion reactions--chest pain, shortness of breath or trouble breathing, feeling faint or lightheaded Side effects that usually do not require medical attention (report to your care team if they continue or are bothersome): Diarrhea Dizziness Headache Nausea Trouble sleeping Vomiting This list may not describe all possible side effects. Call your doctor for medical advice about side effects. You may report side effects to FDA at 1-800-FDA-1088. Where should I keep my medication? This medication is given in a hospital or clinic. It will not be stored at home. NOTE: This sheet is a summary. It may not cover all possible information. If you have  questions about this medicine, talk to your doctor, pharmacist, or health care provider.  2024 Elsevier/Gold Standard (2022-04-08 00:00:00)  Paclitaxel Nanoparticle Albumin-Bound Injection What is this medication? NANOPARTICLE ALBUMIN-BOUND PACLITAXEL (Na no PAHR ti kuhl al BYOO muhn-bound PAK li TAX el) treats some types of cancer. It works by slowing down the growth of cancer cells. This medicine may be used for other purposes; ask your health care provider or pharmacist if you have  questions. COMMON BRAND NAME(S): Abraxane What should I tell my care team before I take this medication? They need to know if you have any of these conditions: Liver disease Low white blood cell levels An unusual or allergic reaction to paclitaxel, albumin, other medications, foods, dyes, or preservatives If you or your partner are pregnant or trying to get pregnant Breast-feeding How should I use this medication? This medication is injected into a vein. It is given by your care team in a hospital or clinic setting. Talk to your care team about the use of this medication in children. Special care may be needed. Overdosage: If you think you have taken too much of this medicine contact a poison control center or emergency room at once. NOTE: This medicine is only for you. Do not share this medicine with others. What if I miss a dose? Keep appointments for follow-up doses. It is important not to miss your dose. Call your care team if you are unable to keep an appointment. What may interact with this medication? Other medications may affect the way this medication works. Talk with your care team about all of the medications you take. They may suggest changes to your treatment plan to lower the risk of side effects and to make sure your medications work as intended. This list may not describe all possible interactions. Give your health care provider a list of all the medicines, herbs, non-prescription drugs, or dietary supplements you use. Also tell them if you smoke, drink alcohol, or use illegal drugs. Some items may interact with your medicine. What should I watch for while using this medication? Your condition will be monitored carefully while you are receiving this medication. You may need blood work while taking this medication. This medication may make you feel generally unwell. This is not uncommon as chemotherapy can affect healthy cells as well as cancer cells. Report any side effects.  Continue your course of treatment even though you feel ill unless your care team tells you to stop. This medication can cause serious allergic reactions. To reduce the risk, your care team may give you other medications to take before receiving this one. Be sure to follow the directions from your care team. This medication may increase your risk of getting an infection. Call your care team for advice if you get a fever, chills, sore throat, or other symptoms of a cold or flu. Do not treat yourself. Try to avoid being around people who are sick. This medication may increase your risk to bruise or bleed. Call your care team if you notice any unusual bleeding. Be careful brushing or flossing your teeth or using a toothpick because you may get an infection or bleed more easily. If you have any dental work done, tell your dentist you are receiving this medication. Talk to your care team if you or your partner may be pregnant. Serious birth defects can occur if you take this medication during pregnancy and for 6 months after the last dose. You will need a negative pregnancy test before  starting this medication. Contraception is recommended while taking this medication and for 6 months after the last dose. Your care team can help you find the option that works for you. If your partner can get pregnant, use a condom during sex while taking this medication and for 3 months after the last dose. Do not breastfeed while taking this medication and for 2 weeks after the last dose. This medication may cause infertility. Talk to your care team if you are concerned about your fertility. What side effects may I notice from receiving this medication? Side effects that you should report to your care team as soon as possible: Allergic reactions--skin rash, itching, hives, swelling of the face, lips, tongue, or throat Dry cough, shortness of breath or trouble breathing Infection--fever, chills, cough, sore throat, wounds  that don't heal, pain or trouble when passing urine, general feeling of discomfort or being unwell Low red blood cell level--unusual weakness or fatigue, dizziness, headache, trouble breathing Pain, tingling, or numbness in the hands or feet Stomach pain, unusual weakness or fatigue, nausea, vomiting, diarrhea, or fever that lasts longer than expected Unusual bruising or bleeding Side effects that usually do not require medical attention (report to your care team if they continue or are bothersome): Diarrhea Fatigue Hair loss Loss of appetite Nausea Vomiting This list may not describe all possible side effects. Call your doctor for medical advice about side effects. You may report side effects to FDA at 1-800-FDA-1088. Where should I keep my medication? This medication is given in a hospital or clinic. It will not be stored at home. NOTE: This sheet is a summary. It may not cover all possible information. If you have questions about this medicine, talk to your doctor, pharmacist, or health care provider.  2024 Elsevier/Gold Standard (2022-04-10 00:00:00)  Potassium Chloride Injection What is this medication? POTASSIUM CHLORIDE (poe TASS i um KLOOR ide) prevents and treats low levels of potassium in your body. Potassium plays an important role in maintaining the health of your kidneys, heart, muscles, and nervous system. This medicine may be used for other purposes; ask your health care provider or pharmacist if you have questions. COMMON BRAND NAME(S): PROAMP What should I tell my care team before I take this medication? They need to know if you have any of these conditions: Addison disease Dehydration Diabetes (high blood sugar) Heart disease High levels of potassium in the blood Irregular heartbeat or rhythm Kidney disease Large areas of burned skin An unusual or allergic reaction to potassium, other medications, foods, dyes, or preservatives Pregnant or trying to get  pregnant Breast-feeding How should I use this medication? This medication is injected into a vein. It is given in a hospital or clinic setting. Talk to your care team about the use of this medication in children. Special care may be needed. Overdosage: If you think you have taken too much of this medicine contact a poison control center or emergency room at once. NOTE: This medicine is only for you. Do not share this medicine with others. What if I miss a dose? This does not apply. This medication is not for regular use. What may interact with this medication? Do not take this medication with any of the following: Certain diuretics, such as spironolactone, triamterene Eplerenone Sodium polystyrene sulfonate This medication may also interact with the following: Certain medications for blood pressure or heart disease, such as lisinopril, losartan, quinapril, valsartan Medications that lower your chance of fighting infection, such as cyclosporine, tacrolimus NSAIDs, medications  for pain and inflammation, such as ibuprofen or naproxen Other potassium supplements Salt substitutes This list may not describe all possible interactions. Give your health care provider a list of all the medicines, herbs, non-prescription drugs, or dietary supplements you use. Also tell them if you smoke, drink alcohol, or use illegal drugs. Some items may interact with your medicine. What should I watch for while using this medication? Visit your care team for regular checks on your progress. Tell your care team if your symptoms do not start to get better or if they get worse. You may need blood work while you are taking this medication. Avoid salt substitutes unless you are told otherwise by your care team. What side effects may I notice from receiving this medication? Side effects that you should report to your care team as soon as possible: Allergic reactions--skin rash, itching, hives, swelling of the face, lips,  tongue, or throat High potassium level--muscle weakness, fast or irregular heartbeat Side effects that usually do not require medical attention (report to your care team if they continue or are bothersome): Diarrhea Nausea Stomach pain Vomiting This list may not describe all possible side effects. Call your doctor for medical advice about side effects. You may report side effects to FDA at 1-800-FDA-1088. Where should I keep my medication? This medication is given in a hospital or clinic. It will not be stored at home. NOTE: This sheet is a summary. It may not cover all possible information. If you have questions about this medicine, talk to your doctor, pharmacist, or health care provider.  2024 Elsevier/Gold Standard (2022-06-06 00:00:00)

## 2024-02-09 NOTE — Progress Notes (Signed)
 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  Ultrasound of the left axilla demonstrated normal lymph nodes.  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  8 mm.  Tumor was ER/PR -0% and HER2 positive +3 by IHC.    Menarche at the age of 64.  She is G5, P5.  Age at first birth 75.  She has used birth control pills in the past.  She attained menopause in her 39s.  Family history significant for breast cancer in 2 sisters of her maternal grandmother.  Mom with cervical cancer in her 36s.  Paternal uncle with stomach cancer.  Her sister has been diagnosed with some form of skin cancer.   Left lumpectomy pathology from 11/04/2023 showed 10 mm grade 3 tumor with negative margins.  3 sentinel lymph nodes negative for malignancy.  Tumor was ER/PR negative and HER2 positive  Interval history-bilateral upper eyelids are still mildly swollen as before.  Skin rash of her face has improved.  However patient reports  worsening bilateral lower extremity swelling.  Her weight has increased consistently over the last 1 month and she is now at 232 pounds as compared to 220 pounds before.  Patient also reports changes in her left toenails especially involving the first 3 toes  ECOG PS- 0 Pain scale- 0   Review of systems- Review of Systems  Constitutional:  Negative for chills, fever, malaise/fatigue and weight loss.  HENT:  Negative for congestion, ear discharge and nosebleeds.   Eyes:  Negative for blurred vision.  Respiratory:  Negative for cough, hemoptysis, sputum production, shortness of breath and wheezing.   Cardiovascular:  Positive for leg swelling. Negative for chest pain, palpitations, orthopnea and claudication.  Gastrointestinal:  Negative for abdominal pain, blood in stool, constipation, diarrhea, heartburn, melena, nausea and vomiting.  Genitourinary:  Negative for dysuria, flank pain, frequency, hematuria and urgency.  Musculoskeletal:  Negative for back pain, joint pain and myalgias.  Skin:  Negative for rash.  Neurological:  Negative for dizziness, tingling, focal weakness, seizures, weakness and headaches.  Endo/Heme/Allergies:  Does not bruise/bleed easily.  Psychiatric/Behavioral:  Negative for depression and suicidal ideas. The patient does not have insomnia.       Allergies  Allergen Reactions   Paclitaxel Shortness Of Breath, Other (See Comments) and Hypertension    Redness to face/chest Back Pain     Flagyl [Metronidazole] Itching     Past Medical History:  Diagnosis Date   Adenoma of appendix    Anemia    Chronic lower back pain    GERD (gastroesophageal  reflux disease)    H/O gestational diabetes mellitus, not currently pregnant    History of kidney stones    Hypercholesteremia    Hyperplastic rectal polyp    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, estrogen receptor negative (HCC)    Obesity    Ovarian cyst    Palpitations    Sigmoid  diverticulitis 08/2012   Subclinical hypothyroidism    SVT (supraventricular tachycardia) (HCC)    on Holter monitor 5 beat run of svt   Thyroid nodule    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   Type 2 diabetes mellitus (HCC)      Past Surgical History:  Procedure Laterality Date   AXILLARY SENTINEL NODE BIOPSY Left 11/04/2023   Procedure: AXILLARY SENTINEL NODE BIOPSY;  Surgeon: Campbell Lerner, MD;  Location: [facility] ORS;  Service: General;  Laterality: Left;   BLADDER SUSPENSION  2009   BREAST BIOPSY Left 10/22/2023   [place] Core Bx, heart clip - path pending   BREAST BIOPSY Left 10/22/2023   [place] LT BREAST BX W LOC DEV 1ST LESION IMG BX SPEC US GUIDE 10/22/2023 [facility]   BREAST LUMPECTOMY WITH RADIOFREQUENCY TAG IDENTIFICATION Left 11/04/2023   Procedure: BREAST LUMPECTOMY WITH RADIOFREQUENCY TAG IDENTIFICATION;  Surgeon: Campbell Lerner, MD;  Location: [facility] ORS;  Service: General;  Laterality: Left;   Colectomy Left    likely sigmoid, open.  for diverticulitis.   COLONOSCOPY WITH PROPOFOL N/A 10/01/2022   Procedure: COLONOSCOPY WITH PROPOFOL;  Surgeon: Toney Reil, MD;  Location: [facility] ENDOSCOPY;  Service: Gastroenterology;  Laterality: N/A;   PORTACATH PLACEMENT Right 11/04/2023   Procedure: INSERTION PORT-A-CATH;  Surgeon: Campbell Lerner, MD;  Location: [facility] ORS;  Service: General;  Laterality: Right;   TUBAL LIGATION     XI ROBOTIC LAPAROSCOPIC ASSISTED APPENDECTOMY N/A 12/29/2022   Procedure: XI ROBOTIC LYSIS OF ADHESIONS AND APPENDECTOMY;  Surgeon: Campbell Lerner, MD;  Location: [facility] ORS;  Service: General;  Laterality: N/A;    Social History   Socioeconomic History   Marital status: Single    Spouse name: Not on file   Number of children: Not on file   Years of education: Not on file   Highest education level: Some college, no degree  Occupational History   Not on file  Tobacco Use   Smoking status: Former    Current packs/day: 0.00    Types: Cigarettes, Cigars     Quit date: 09/19/2011    Years since quitting: 12.4   Smokeless tobacco: Never  Vaping Use   Vaping status: Former  Substance and Sexual Activity   Alcohol use: Not Currently    Comment: Occasional glass of wine   Drug use: No   Sexual activity: Not Currently  Other Topics Concern   Not on file  Social History Narrative   Works in Audiological scientist at [employer].   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   Social Drivers of Corporate investment banker Strain: Low Risk  (06/17/2023)   Overall Financial Resource Strain (CARDIA)    Difficulty of Paying Living Expenses: Not very hard  Food Insecurity: No Food Insecurity (10/28/2023)   Hunger Vital Sign    Worried About Running Out of Food in the Last Year: Never true    Ran Out of Food in the Last Year: Never true  Transportation Needs: No Transportation Needs (10/28/2023)   PRAPARE - Administrator, Civil Service (Medical): No    Lack of Transportation (Non-Medical): No  Physical Activity:  Sufficiently Active (06/17/2023)   Exercise Vital Sign    Days of Exercise per Week: 7 days    Minutes of Exercise per Session: 60 min  Stress: Stress Concern Present (06/17/2023)   [employer] of Occupational Health - Occupational Stress Questionnaire    Feeling of Stress : To some extent  Social Connections: Moderately Integrated (06/17/2023)   Social Connection and Isolation Panel [NHANES]    Frequency of Communication with Friends and Family: More than three times a week    Frequency of Social Gatherings with Friends and Family: Once a week    Attends Religious Services: More than 4 times per year    Active Member of [employer] or Organizations: Yes    Attends Banker Meetings: More than 4 times per year    Marital Status: Divorced  Intimate Partner Violence: Not At Risk (10/28/2023)   Humiliation, Afraid, Rape, and Kick questionnaire    Fear of Current or Ex-Partner: No    Emotionally Abused: No     Physically Abused: No    Sexually Abused: No    Family History  Problem Relation Age of Onset   Cancer - Cervical Mother        dx 57s; Passed away from Kidney Issues (Not Cancer)   Heart disease Father    Kidney disease Father    Diabetes Father    Skin cancer Sister    Stomach cancer Paternal Uncle    Diabetes Maternal Grandmother    Skin cancer Maternal Grandfather    Diabetes Paternal Grandmother    Breast cancer Other        mat great aunts x2; unk age of dx   Lung cancer Cousin    Brain cancer Cousin      Current Outpatient Medications:    acetaminophen (TYLENOL) 500 MG tablet, Take 500 mg by mouth every 6 (six) hours as needed., Disp: , Rfl:    atenolol (TENORMIN) 25 MG tablet, TAKE 1 TABLET (25 MG TOTAL) BY MOUTH DAILY. FOR PALPITATIONS. (Patient taking differently: Take 25 mg by mouth at bedtime. For palpitations.), Disp: 90 tablet, Rfl: 2   Biotin 1000 MCG tablet, Take 1,000 mcg by mouth daily., Disp: , Rfl:    blood glucose meter kit and supplies KIT, Dispense based on patient and insurance preference. Use up to four times daily as directed. (FOR ICD-9 250.00, 250.01)., Disp: 1 each, Rfl: 0   cyanocobalamin 500 MCG tablet, Take 1,000 mcg by mouth daily. Reported on 06/12/2016, Disp: , Rfl:    docusate sodium (COLACE) 100 MG capsule, Take 200 mg by mouth daily as needed for moderate constipation. Reported on 06/12/2016, Disp: , Rfl:    ibuprofen (ADVIL) 800 MG tablet, Take 1 tablet (800 mg total) by mouth every 8 (eight) hours as needed., Disp: 30 tablet, Rfl: 0   lidocaine-prilocaine (EMLA) cream, Apply to affected area once, Disp: 30 g, Rfl: 3   magnesium 30 MG tablet, Take 30 mg by mouth daily as needed., Disp: , Rfl:    metFORMIN (GLUCOPHAGE-XR) 500 MG 24 hr tablet, TAKE 1 TABLET (500 MG TOTAL) BY MOUTH DAILY WITH BREAKFAST. FOR DIABETES., Disp: 90 tablet, Rfl: 0   ondansetron (ZOFRAN) 8 MG tablet, Take 1 tablet (8 mg total) by mouth every 8 (eight) hours as needed for  nausea or vomiting., Disp: 30 tablet, Rfl: 1   prochlorperazine (COMPAZINE) 10 MG tablet, Take 1 tablet (10 mg total) by mouth every 6 (six) hours as needed for nausea or vomiting.,  Disp: 30 tablet, Rfl: 1   pyridOXINE (VITAMIN B6) 25 MG tablet, Take 25 mg by mouth daily., Disp: , Rfl:    rosuvastatin (CRESTOR) 5 MG tablet, TAKE 1 TABLET (5 MG TOTAL) BY MOUTH EVERY EVENING. FOR CHOLESTEROL., Disp: 90 tablet, Rfl: 2   azithromycin (ZITHROMAX Z-PAK) 250 MG tablet, 500mg  once for 1 day, then 250mg  once daily for 4 days (Patient not taking: Reported on 02/09/2024), Disp: 6 each, Rfl: 0   furosemide (LASIX) 20 MG tablet, Take 1 tablet (20 mg total) by mouth daily., Disp: 5 tablet, Rfl: 0   potassium chloride SA (KLOR-CON M) 20 MEQ tablet, Take 1 tablet (20 mEq total) by mouth 2 (two) times daily., Disp: 14 tablet, Rfl: 0 No current facility-administered medications for this visit.  Facility-Administered Medications Ordered in Other Visits:    0.9 %  sodium chloride infusion, , Intravenous, Continuous, Creig Hines, MD, Stopped at 11/27/23 1158   0.9 %  sodium chloride infusion, , Intravenous, Continuous, Creig Hines, MD, Last Rate: 10 mL/hr at 02/09/24 0920, New Bag at 02/09/24 0920   heparin lock flush 100 unit/mL, 500 Units, Intracatheter, Once PRN, Creig Hines, MD   PACLitaxel-protein bound (ABRAXANE) chemo infusion 175 mg, 80 mg/m2 (Treatment Plan Recorded), Intravenous, Once, Creig Hines, MD   trastuzumab-anns [facility]) 189 mg in sodium chloride 0.9 % 250 mL chemo infusion, 2 mg/kg (Treatment Plan Recorded), Intravenous, Once, Creig Hines, MD, Last Rate: 518 mL/hr at 02/09/24 1031, 189 mg at 02/09/24 1031  Physical exam:  Vitals:   02/09/24 0850 02/09/24 0852  BP: (!) 154/78 (!) 160/59  Pulse: 93 90  Resp: 17   Temp: 97.7 F (36.5 C)   SpO2: 97%   Weight: 232 lb (105.2 kg)    Physical Exam Cardiovascular:     Rate and Rhythm: Normal rate and regular rhythm.     Heart  sounds: Normal heart sounds.  Pulmonary:     Effort: Pulmonary effort is normal.     Breath sounds: Normal breath sounds.  Abdominal:     General: Bowel sounds are normal.     Palpations: Abdomen is soft.  Musculoskeletal:     Cervical back: Normal range of motion.     Right lower leg: Edema present.     Left lower leg: Edema present.  Skin:    General: Skin is warm and dry.     Comments: There is bruising noted under the nailbed involving the first 3 toes  Neurological:     Mental Status: She is alert and oriented to person, place, and time.         Latest Ref Rng & Units 02/09/2024    8:32 AM  CMP  Glucose 70 - 99 mg/dL 409   BUN 8 - 23 mg/dL 9   Creatinine 8.11 - 9.14 mg/dL 7.82   Sodium 956 - 213 mmol/L 139   Potassium 3.5 - 5.1 mmol/L 3.3   Chloride 98 - 111 mmol/L 107   CO2 22 - 32 mmol/L 23   Calcium 8.9 - 10.3 mg/dL 8.4   Total Protein 6.5 - 8.1 g/dL 6.1   Total Bilirubin 0.0 - 1.2 mg/dL 0.7   Alkaline Phos 38 - 126 U/L 81   AST 15 - 41 U/L 41   ALT 0 - 44 U/L 28       Latest Ref Rng & Units 02/09/2024    8:32 AM  CBC  WBC 4.0 - 10.5 K/uL 8.2  Hemoglobin 12.0 - 15.0 g/dL 40.9   Hematocrit 81.1 - 46.0 % 32.0   Platelets 150 - 400 K/uL 333     No images are attached to the encounter.  DG Chest 1 View Result Date: 01/20/2024 CLINICAL DATA:  Shortness of breath x2 weeks, left breast cancer EXAM: CHEST  1 VIEW COMPARISON:  11/04/2023 FINDINGS: Mild left basilar opacity, favoring scarring when correlating with the prior. Right lung is clear. No pleural effusion or pneumothorax. The heart is normal in size. Right chest port terminates at the cavoatrial junction. IMPRESSION: Mild left basilar opacity, favoring scarring when correlating with the prior. No acute cardiopulmonary disease. Electronically Signed   By: Charline Bills M.D.   On: 01/20/2024 00:31     Assessment and plan- Patient is a 63 y.o. female  with history of pathological prognostic stage Ia  invasive mammary carcinoma of the left breast pT1b N0 M0 grade 3 ER/PR negative and HER2 positive.  She is here for on treatment assessment prior to cycle 8 of weekly Abraxane and Herceptin  Patient received 3 doses of Zarzio last week and white cell count is improved today to 8.2 and neutrophils are more than 1 to proceed with cycle 8 of Abraxane and Herceptin today.  She will directly proceed with cycle 9 next week and I will see her back in 2 weeks for cycle 10.  Patient does have bilateral lower extremity edema as well as weight gain which I suspect is secondary to fluid detection secondary to Abraxane.  I am giving her a trial of Lasix 20 mg daily for 5 days and we will plan to repeat her BMP on Friday this week.  If 20 mg daily is not improving her edema after 2 days she will increase the dose to 40 mg daily.  Will also send a referral for potassium 20 mEq daily for 2 weeks.  Chemo-induced anemia: Stable continue to monitor  Nail changes involving the toes of the left foot: On clinical exam this does not appear to be infected.  I have asked her to soak her feet in antiseptic solution and water along with topical triple antibiotic.  If it worsens she will let us know  Patient also has a skin rash especially involving her bilateral eyelids possibly secondary to Abraxane.  Overall it appears stable today as compared to last week.  If it worsens I will consider giving her a short course of steroids  Hypokalemia: She will receive 20 mill equivalents of IV potassium today   Visit Diagnosis 1. Encounter for antineoplastic chemotherapy   2. Leg swelling   3. Fluid retention   4. 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 in female, estrogen receptor negative (HCC)      Dr. Owens Shark, MD, MPH [facility] [phone] 02/09/2024 10:39 AM

## 2024-02-09 NOTE — Progress Notes (Signed)
 Per. Dr. Smith Robert,  Ok to keep kanjinti dose at 189 with current weight gain. Patient last week treatment was deferred due to low ANC   Sharen Hones, PharmD, BCPS Clinical Pharmacist

## 2024-02-09 NOTE — Patient Instructions (Signed)

## 2024-02-10 ENCOUNTER — Inpatient Hospital Stay: Payer: Commercial Managed Care - PPO

## 2024-02-10 VITALS — BP 127/70 | HR 99 | Resp 17

## 2024-02-10 DIAGNOSIS — Z5112 Encounter for antineoplastic immunotherapy: Secondary | ICD-10-CM | POA: Diagnosis not present

## 2024-02-10 DIAGNOSIS — C50312 Malignant neoplasm of lower-inner quadrant of left female breast: Secondary | ICD-10-CM

## 2024-02-10 MED ORDER — FILGRASTIM-SNDZ 480 MCG/0.8ML IJ SOSY
480.0000 ug | PREFILLED_SYRINGE | Freq: Every day | INTRAMUSCULAR | Status: DC
  Administered 2024-02-10: 480 ug via SUBCUTANEOUS
  Filled 2024-02-10: qty 0.8

## 2024-02-10 NOTE — Patient Instructions (Signed)
 Filgrastim Injection What is this medication? FILGRASTIM (fil GRA stim) lowers the risk of infection in people who are receiving chemotherapy. It works by Systems analyst make more white blood cells, which protects your body from infection. It may also be used to help people who have been exposed to high doses of radiation. It can be used to help prepare your body before a stem cell transplant. It works by helping your bone marrow make and release stem cells into the blood. This medicine may be used for other purposes; ask your health care provider or pharmacist if you have questions. COMMON BRAND NAME(S): Neupogen, Nivestym, Nypozi, Releuko, Zarxio What should I tell my care team before I take this medication? They need to know if you have any of these conditions: History of blood diseases, such as sickle cell anemia Kidney disease Recent or ongoing radiation An unusual or allergic reaction to filgrastim, pegfilgrastim, latex, rubber, other medications, foods, dyes, or preservatives Pregnant or trying to get pregnant Breast-feeding How should I use this medication? This medication is injected under the skin or into a vein. It is usually given by your care team in a hospital or clinic setting. It may be given at home. If you get this medication at home, you will be taught how to prepare and give it. Use exactly as directed. Take it as directed on the prescription label at the same time every day. Keep taking it unless your care team tells you to stop. It is important that you put your used needles and syringes in a special sharps container. Do not put them in a trash can. If you do not have a sharps container, call your pharmacist or care team to get one. This medication comes with INSTRUCTIONS FOR USE. Ask your pharmacist for directions on how to use this medication. Read the information carefully. Talk to your pharmacist or care team if you have questions. Talk to your care team about the use of  this medication in children. While it may be prescribed for children for selected conditions, precautions do apply. Overdosage: If you think you have taken too much of this medicine contact a poison control center or emergency room at once. NOTE: This medicine is only for you. Do not share this medicine with others. What if I miss a dose? It is important not to miss any doses. Talk to your care team about what to do if you miss a dose. What may interact with this medication? Medications that may cause a release of neutrophils, such as lithium This list may not describe all possible interactions. Give your health care provider a list of all the medicines, herbs, non-prescription drugs, or dietary supplements you use. Also tell them if you smoke, drink alcohol, or use illegal drugs. Some items may interact with your medicine. What should I watch for while using this medication? Your condition will be monitored carefully while you are receiving this medication. You may need bloodwork while taking this medication. Talk to your care team about your risk of cancer. You may be more at risk for certain types of cancer if you take this medication. What side effects may I notice from receiving this medication? Side effects that you should report to your care team as soon as possible: Allergic reactions--skin rash, itching, hives, swelling of the face, lips, tongue, or throat Capillary leak syndrome--stomach or muscle pain, unusual weakness or fatigue, feeling faint or lightheaded, decrease in the amount of urine, swelling of the ankles, hands,  or feet, trouble breathing High white blood cell level--fever, fatigue, trouble breathing, night sweats, change in vision, weight loss Inflammation of the aorta--fever, fatigue, back, chest, or stomach pain, severe headache Kidney injury (glomerulonephritis)--decrease in the amount of urine, red or dark brown urine, foamy or bubbly urine, swelling of the ankles, hands,  or feet Shortness of breath or trouble breathing Spleen injury--pain in upper left stomach or shoulder Unusual bruising or bleeding Side effects that usually do not require medical attention (report to your care team if they continue or are bothersome): Back pain Bone pain Fatigue Fever Headache Nausea This list may not describe all possible side effects. Call your doctor for medical advice about side effects. You may report side effects to FDA at 1-800-FDA-1088. Where should I keep my medication? Keep out of the reach of children and pets. Keep this medication in the original packaging until you are ready to take it. Protect from light. See product for storage information. Each product may have different instructions. Get rid of any unused medication after the expiration date. To get rid of medications that are no longer needed or have expired: Take the medication to a medications take-back program. Check with your pharmacy or law enforcement to find a location. If you cannot return the medication, ask your pharmacist or care team how to get rid of this medication safely. NOTE: This sheet is a summary. It may not cover all possible information. If you have questions about this medicine, talk to your doctor, pharmacist, or health care provider.  2024 Elsevier/Gold Standard (2022-04-17 00:00:00)

## 2024-02-11 ENCOUNTER — Inpatient Hospital Stay: Payer: Commercial Managed Care - PPO

## 2024-02-11 DIAGNOSIS — Z5112 Encounter for antineoplastic immunotherapy: Secondary | ICD-10-CM | POA: Diagnosis not present

## 2024-02-11 DIAGNOSIS — Z171 Estrogen receptor negative status [ER-]: Secondary | ICD-10-CM

## 2024-02-11 MED ORDER — FILGRASTIM-SNDZ 480 MCG/0.8ML IJ SOSY
480.0000 ug | PREFILLED_SYRINGE | Freq: Every day | INTRAMUSCULAR | Status: DC
  Administered 2024-02-11: 480 ug via SUBCUTANEOUS
  Filled 2024-02-11: qty 0.8

## 2024-02-12 ENCOUNTER — Inpatient Hospital Stay: Payer: Commercial Managed Care - PPO

## 2024-02-12 ENCOUNTER — Telehealth: Payer: Self-pay

## 2024-02-12 ENCOUNTER — Inpatient Hospital Stay

## 2024-02-12 DIAGNOSIS — Z171 Estrogen receptor negative status [ER-]: Secondary | ICD-10-CM

## 2024-02-12 DIAGNOSIS — Z5112 Encounter for antineoplastic immunotherapy: Secondary | ICD-10-CM | POA: Diagnosis not present

## 2024-02-12 LAB — BASIC METABOLIC PANEL - CANCER CENTER ONLY
Anion gap: 6 (ref 5–15)
BUN: 12 mg/dL (ref 8–23)
CO2: 24 mmol/L (ref 22–32)
Calcium: 8.8 mg/dL — ABNORMAL LOW (ref 8.9–10.3)
Chloride: 105 mmol/L (ref 98–111)
Creatinine: 0.65 mg/dL (ref 0.44–1.00)
GFR, Estimated: >60 mL/min (ref >60)
Glucose, Bld: 259 mg/dL — ABNORMAL HIGH (ref 70–99)
Potassium: 4.6 mmol/L (ref 3.5–5.1)
Sodium: 135 mmol/L (ref 135–145)

## 2024-02-12 MED ORDER — FILGRASTIM-SNDZ 480 MCG/0.8ML IJ SOSY
480.0000 ug | PREFILLED_SYRINGE | Freq: Every day | INTRAMUSCULAR | Status: DC
  Administered 2024-02-12: 480 ug via SUBCUTANEOUS
  Filled 2024-02-12: qty 0.8

## 2024-02-12 NOTE — Telephone Encounter (Signed)
 Patient came in for Zarixo injection today and expressed that she is still experiencing some edema. Per patient , Dr. Smith Robert sent in Lasix to help with fluid. Patient states she has seen an improvement with swelling but only has 2 doses left. She has to work her 12 hour shift tomorrow and is concerned with her being on her feet all day the swelling will return and she wont have any Lasix left . Patient wanting to know if she can get a refill? Please advise

## 2024-02-14 ENCOUNTER — Other Ambulatory Visit: Payer: Self-pay | Admitting: Oncology

## 2024-02-15 ENCOUNTER — Encounter: Payer: Self-pay | Admitting: Oncology

## 2024-02-15 ENCOUNTER — Other Ambulatory Visit: Payer: Self-pay

## 2024-02-15 ENCOUNTER — Encounter: Payer: Self-pay | Admitting: Internal Medicine

## 2024-02-15 MED ORDER — FUROSEMIDE 20 MG PO TABS
20.0000 mg | ORAL_TABLET | Freq: Every day | ORAL | 0 refills | Status: DC
Start: 2024-02-15 — End: 2024-05-19

## 2024-02-16 ENCOUNTER — Inpatient Hospital Stay: Payer: Commercial Managed Care - PPO

## 2024-02-16 ENCOUNTER — Ambulatory Visit
Admission: RE | Admit: 2024-02-16 | Discharge: 2024-02-16 | Disposition: A | Discharge status: Home / Self Care | Payer: Commercial Managed Care - PPO | Source: Ambulatory Visit | Attending: Oncology | Admitting: Oncology

## 2024-02-16 VITALS — BP 138/58 | HR 90 | Temp 98.0°F | Resp 20 | Wt 220.5 lb

## 2024-02-16 DIAGNOSIS — C50312 Malignant neoplasm of lower-inner quadrant of left female breast: Secondary | ICD-10-CM | POA: Diagnosis not present

## 2024-02-16 DIAGNOSIS — Z5112 Encounter for antineoplastic immunotherapy: Secondary | ICD-10-CM | POA: Diagnosis not present

## 2024-02-16 DIAGNOSIS — Z171 Estrogen receptor negative status [ER-]: Secondary | ICD-10-CM | POA: Insufficient documentation

## 2024-02-16 DIAGNOSIS — Z0189 Encounter for other specified special examinations: Secondary | ICD-10-CM

## 2024-02-16 LAB — CBC WITH DIFFERENTIAL (CANCER CENTER ONLY)
Abs Immature Granulocytes: 0.05 10*3/uL (ref 0.00–0.07)
Basophils Absolute: 0.1 10*3/uL (ref 0.0–0.1)
Basophils Relative: 2 %
Eosinophils Absolute: 0.1 10*3/uL (ref 0.0–0.5)
Eosinophils Relative: 1 %
HCT: 32.3 % — ABNORMAL LOW (ref 36.0–46.0)
Hemoglobin: 10.4 g/dL — ABNORMAL LOW (ref 12.0–15.0)
Immature Granulocytes: 1 %
Lymphocytes Relative: 43 %
Lymphs Abs: 1.6 10*3/uL (ref 0.7–4.0)
MCH: 29.8 pg (ref 26.0–34.0)
MCHC: 32.2 g/dL (ref 30.0–36.0)
MCV: 92.6 fL (ref 80.0–100.0)
Monocytes Absolute: 0.2 10*3/uL (ref 0.1–1.0)
Monocytes Relative: 6 %
Neutro Abs: 1.7 10*3/uL (ref 1.7–7.7)
Neutrophils Relative %: 47 %
Platelet Count: 311 10*3/uL (ref 150–400)
RBC: 3.49 MIL/uL — ABNORMAL LOW (ref 3.87–5.11)
RDW: 16.4 % — ABNORMAL HIGH (ref 11.5–15.5)
WBC Count: 3.8 10*3/uL — ABNORMAL LOW (ref 4.0–10.5)
nRBC: 0 % (ref 0.0–0.2)

## 2024-02-16 LAB — CMP (CANCER CENTER ONLY)
ALT: 34 U/L (ref 0–44)
AST: 61 U/L — ABNORMAL HIGH (ref 15–41)
Albumin: 3.4 g/dL — ABNORMAL LOW (ref 3.5–5.0)
Alkaline Phosphatase: 90 U/L (ref 38–126)
Anion gap: 8 (ref 5–15)
BUN: 11 mg/dL (ref 8–23)
CO2: 22 mmol/L (ref 22–32)
Calcium: 8.6 mg/dL — ABNORMAL LOW (ref 8.9–10.3)
Chloride: 107 mmol/L (ref 98–111)
Creatinine: 0.62 mg/dL (ref 0.44–1.00)
GFR, Estimated: >60 mL/min (ref >60)
Glucose, Bld: 294 mg/dL — ABNORMAL HIGH (ref 70–99)
Potassium: 4 mmol/L (ref 3.5–5.1)
Sodium: 137 mmol/L (ref 135–145)
Total Bilirubin: 0.7 mg/dL (ref 0.0–1.2)
Total Protein: 6.5 g/dL (ref 6.5–8.1)

## 2024-02-16 LAB — ECHOCARDIOGRAM COMPLETE
AR max vel: 2.4 cm2
AV Area VTI: 2.92 cm2
AV Area mean vel: 2.4 cm2
AV Mean grad: 6 mmHg
AV Peak grad: 10.4 mmHg
Ao pk vel: 1.61 m/s
Area-P 1/2: 4.29 cm2
MV VTI: 2.37 cm2
S' Lateral: 3.4 cm

## 2024-02-16 MED ORDER — PROCHLORPERAZINE MALEATE 10 MG PO TABS
10.0000 mg | ORAL_TABLET | Freq: Once | ORAL | Status: AC
  Administered 2024-02-16: 10 mg via ORAL
  Filled 2024-02-16: qty 1

## 2024-02-16 MED ORDER — SODIUM CHLORIDE 0.9 % IV SOLN
INTRAVENOUS | Status: DC
  Filled 2024-02-16: qty 250

## 2024-02-16 MED ORDER — ACETAMINOPHEN 325 MG PO TABS
650.0000 mg | ORAL_TABLET | Freq: Once | ORAL | Status: AC
  Administered 2024-02-16: 650 mg via ORAL
  Filled 2024-02-16: qty 2

## 2024-02-16 MED ORDER — DIPHENHYDRAMINE HCL 50 MG/ML IJ SOLN
50.0000 mg | Freq: Once | INTRAMUSCULAR | Status: AC
  Administered 2024-02-16: 50 mg via INTRAVENOUS
  Filled 2024-02-16: qty 1

## 2024-02-16 MED ORDER — HEPARIN SOD (PORK) LOCK FLUSH 100 UNIT/ML IV SOLN
500.0000 [IU] | Freq: Once | INTRAVENOUS | Status: AC | PRN
Start: 2024-02-16 — End: 2024-02-16
  Administered 2024-02-16: 500 [IU]
  Filled 2024-02-16: qty 5

## 2024-02-16 MED ORDER — SODIUM CHLORIDE 0.9 % IV SOLN
2.0000 mg/kg | Freq: Once | INTRAVENOUS | Status: AC
  Administered 2024-02-16: 189 mg via INTRAVENOUS
  Filled 2024-02-16: qty 9

## 2024-02-16 MED ORDER — PACLITAXEL PROTEIN-BOUND CHEMO INJECTION 100 MG
80.0000 mg/m2 | Freq: Once | INTRAVENOUS | Status: AC
  Administered 2024-02-16: 175 mg via INTRAVENOUS
  Filled 2024-02-16: qty 35

## 2024-02-16 NOTE — Progress Notes (Signed)
*  PRELIMINARY RESULTS* Echocardiogram 2D Echocardiogram has been performed.  Rebecca Macias 02/16/2024, 9:43 AM

## 2024-02-16 NOTE — Patient Instructions (Signed)
 [facility] - A DEPT OF [facility]  Discharge Instructions: Thank you for choosing [facility] to provide your oncology and hematology care.  If you have a lab appointment with the Cancer Center, please go directly to the Cancer Center and check in at the registration area.  Wear comfortable clothing and clothing appropriate for easy access to any Portacath or PICC line.   We strive to give you quality time with your provider. You may need to reschedule your appointment if you arrive late (15 or more minutes).  Arriving late affects you and other patients whose appointments are after yours.  Also, if you miss three or more appointments without notifying the office, you may be dismissed from the clinic at the provider's discretion.      For prescription refill requests, have your pharmacy contact our office and allow 72 hours for refills to be completed.    Today you received the following chemotherapy and/or immunotherapy agents Kanjinti and Abraxane       To help prevent nausea and vomiting after your treatment, we encourage you to take your nausea medication as directed.  BELOW ARE SYMPTOMS THAT SHOULD BE REPORTED IMMEDIATELY: *FEVER GREATER THAN 100.4 F (38 C) OR HIGHER *CHILLS OR SWEATING *NAUSEA AND VOMITING THAT IS NOT CONTROLLED WITH YOUR NAUSEA MEDICATION *UNUSUAL SHORTNESS OF BREATH *UNUSUAL BRUISING OR BLEEDING *URINARY PROBLEMS (pain or burning when urinating, or frequent urination) *BOWEL PROBLEMS (unusual diarrhea, constipation, pain near the anus) TENDERNESS IN MOUTH AND THROAT WITH OR WITHOUT PRESENCE OF ULCERS (sore throat, sores in mouth, or a toothache) UNUSUAL RASH, SWELLING OR PAIN  UNUSUAL VAGINAL DISCHARGE OR ITCHING   Items with * indicate a potential emergency and should be followed up as soon as possible or go to the Emergency Department if any problems should occur.  Please show the CHEMOTHERAPY ALERT CARD or  IMMUNOTHERAPY ALERT CARD at check-in to the Emergency Department and triage nurse.  Should you have questions after your visit or need to cancel or reschedule your appointment, please contact [facility] - A DEPT OF [facility]  [phone] and follow the prompts.  Office hours are 8:00 a.m. to 4:30 p.m. Monday - Friday. Please note that voicemails left after 4:00 p.m. may not be returned until the following business day.  We are closed weekends and major holidays. You have access to a nurse at all times for urgent questions. Please call the main number to the clinic [phone] and follow the prompts.  For any non-urgent questions, you may also contact your provider using MyChart. We now offer e-Visits for anyone 59 and older to request care online for non-urgent symptoms. For details visit [email or web address].   Also download the MyChart app! Go to the app store, search "MyChart", open the app, select [facility], and log in with your MyChart username and password.

## 2024-02-17 ENCOUNTER — Inpatient Hospital Stay: Payer: Commercial Managed Care - PPO

## 2024-02-17 DIAGNOSIS — Z5112 Encounter for antineoplastic immunotherapy: Secondary | ICD-10-CM | POA: Diagnosis not present

## 2024-02-17 DIAGNOSIS — Z171 Estrogen receptor negative status [ER-]: Secondary | ICD-10-CM

## 2024-02-17 MED ORDER — FILGRASTIM-SNDZ 480 MCG/0.8ML IJ SOSY
480.0000 ug | PREFILLED_SYRINGE | Freq: Every day | INTRAMUSCULAR | Status: DC
  Administered 2024-02-17: 480 ug via SUBCUTANEOUS
  Filled 2024-02-17: qty 0.8

## 2024-02-18 ENCOUNTER — Inpatient Hospital Stay: Payer: Commercial Managed Care - PPO

## 2024-02-18 DIAGNOSIS — C50312 Malignant neoplasm of lower-inner quadrant of left female breast: Secondary | ICD-10-CM

## 2024-02-18 DIAGNOSIS — Z5112 Encounter for antineoplastic immunotherapy: Secondary | ICD-10-CM | POA: Diagnosis not present

## 2024-02-18 MED ORDER — FILGRASTIM-SNDZ 480 MCG/0.8ML IJ SOSY
480.0000 ug | PREFILLED_SYRINGE | Freq: Every day | INTRAMUSCULAR | Status: DC
  Administered 2024-02-18: 480 ug via SUBCUTANEOUS
  Filled 2024-02-18: qty 0.8

## 2024-02-19 ENCOUNTER — Inpatient Hospital Stay: Payer: Commercial Managed Care - PPO

## 2024-02-19 DIAGNOSIS — C50312 Malignant neoplasm of lower-inner quadrant of left female breast: Secondary | ICD-10-CM

## 2024-02-19 DIAGNOSIS — Z5112 Encounter for antineoplastic immunotherapy: Secondary | ICD-10-CM | POA: Diagnosis not present

## 2024-02-19 MED ORDER — FILGRASTIM-SNDZ 480 MCG/0.8ML IJ SOSY
480.0000 ug | PREFILLED_SYRINGE | Freq: Every day | INTRAMUSCULAR | Status: DC
  Administered 2024-02-19: 480 ug via SUBCUTANEOUS
  Filled 2024-02-19: qty 0.8

## 2024-02-23 ENCOUNTER — Inpatient Hospital Stay: Payer: Commercial Managed Care - PPO

## 2024-02-23 ENCOUNTER — Encounter: Payer: Self-pay | Admitting: Oncology

## 2024-02-23 ENCOUNTER — Inpatient Hospital Stay: Payer: Commercial Managed Care - PPO | Admitting: Oncology

## 2024-02-23 VITALS — BP 133/67 | HR 89 | Temp 96.0°F | Resp 19 | Ht 65.0 in | Wt 225.3 lb

## 2024-02-23 DIAGNOSIS — Z5112 Encounter for antineoplastic immunotherapy: Secondary | ICD-10-CM | POA: Diagnosis not present

## 2024-02-23 DIAGNOSIS — Z171 Estrogen receptor negative status [ER-]: Secondary | ICD-10-CM | POA: Diagnosis not present

## 2024-02-23 DIAGNOSIS — D701 Agranulocytosis secondary to cancer chemotherapy: Secondary | ICD-10-CM

## 2024-02-23 DIAGNOSIS — G620 Drug-induced polyneuropathy: Secondary | ICD-10-CM

## 2024-02-23 DIAGNOSIS — C50312 Malignant neoplasm of lower-inner quadrant of left female breast: Secondary | ICD-10-CM

## 2024-02-23 DIAGNOSIS — T451X5A Adverse effect of antineoplastic and immunosuppressive drugs, initial encounter: Secondary | ICD-10-CM

## 2024-02-23 DIAGNOSIS — Z5111 Encounter for antineoplastic chemotherapy: Secondary | ICD-10-CM

## 2024-02-23 LAB — CBC WITH DIFFERENTIAL (CANCER CENTER ONLY)
Abs Immature Granulocytes: 0.24 10*3/uL — ABNORMAL HIGH (ref 0.00–0.07)
Basophils Absolute: 0.1 10*3/uL (ref 0.0–0.1)
Basophils Relative: 2 %
Eosinophils Absolute: 0.2 10*3/uL (ref 0.0–0.5)
Eosinophils Relative: 4 %
HCT: 29.8 % — ABNORMAL LOW (ref 36.0–46.0)
Hemoglobin: 9.6 g/dL — ABNORMAL LOW (ref 12.0–15.0)
Immature Granulocytes: 6 %
Lymphocytes Relative: 49 %
Lymphs Abs: 2.2 10*3/uL (ref 0.7–4.0)
MCH: 29.8 pg (ref 26.0–34.0)
MCHC: 32.2 g/dL (ref 30.0–36.0)
MCV: 92.5 fL (ref 80.0–100.0)
Monocytes Absolute: 0.5 10*3/uL (ref 0.1–1.0)
Monocytes Relative: 11 %
Neutro Abs: 1.2 10*3/uL — ABNORMAL LOW (ref 1.7–7.7)
Neutrophils Relative %: 28 %
Platelet Count: 345 10*3/uL (ref 150–400)
RBC: 3.22 MIL/uL — ABNORMAL LOW (ref 3.87–5.11)
RDW: 16.8 % — ABNORMAL HIGH (ref 11.5–15.5)
WBC Count: 4.3 10*3/uL (ref 4.0–10.5)
nRBC: 0 % (ref 0.0–0.2)

## 2024-02-23 LAB — CMP (CANCER CENTER ONLY)
ALT: 29 U/L (ref 0–44)
AST: 46 U/L — ABNORMAL HIGH (ref 15–41)
Albumin: 3.4 g/dL — ABNORMAL LOW (ref 3.5–5.0)
Alkaline Phosphatase: 74 U/L (ref 38–126)
Anion gap: 8 (ref 5–15)
BUN: 10 mg/dL (ref 8–23)
CO2: 24 mmol/L (ref 22–32)
Calcium: 8.3 mg/dL — ABNORMAL LOW (ref 8.9–10.3)
Chloride: 103 mmol/L (ref 98–111)
Creatinine: 0.74 mg/dL (ref 0.44–1.00)
GFR, Estimated: >60 mL/min (ref >60)
Glucose, Bld: 256 mg/dL — ABNORMAL HIGH (ref 70–99)
Potassium: 3 mmol/L — ABNORMAL LOW (ref 3.5–5.1)
Sodium: 135 mmol/L (ref 135–145)
Total Bilirubin: 0.6 mg/dL (ref 0.0–1.2)
Total Protein: 6.1 g/dL — ABNORMAL LOW (ref 6.5–8.1)

## 2024-02-23 MED ORDER — DIPHENHYDRAMINE HCL 50 MG/ML IJ SOLN
50.0000 mg | Freq: Once | INTRAMUSCULAR | Status: AC
  Administered 2024-02-23: 50 mg via INTRAVENOUS
  Filled 2024-02-23: qty 1

## 2024-02-23 MED ORDER — TRASTUZUMAB-ANNS CHEMO 150 MG IV SOLR
6.0000 mg/kg | Freq: Once | INTRAVENOUS | Status: AC
  Administered 2024-02-23: 600 mg via INTRAVENOUS
  Filled 2024-02-23: qty 28.57

## 2024-02-23 MED ORDER — SODIUM CHLORIDE 0.9 % IV SOLN
INTRAVENOUS | Status: DC
  Filled 2024-02-23: qty 250

## 2024-02-23 MED ORDER — HEPARIN SOD (PORK) LOCK FLUSH 100 UNIT/ML IV SOLN
500.0000 [IU] | Freq: Once | INTRAVENOUS | Status: DC | PRN
  Filled 2024-02-23: qty 5

## 2024-02-23 MED ORDER — ACETAMINOPHEN 325 MG PO TABS
650.0000 mg | ORAL_TABLET | Freq: Once | ORAL | Status: AC
  Administered 2024-02-23: 650 mg via ORAL
  Filled 2024-02-23: qty 2

## 2024-02-23 MED ORDER — SODIUM CHLORIDE 0.9% FLUSH
10.0000 mL | INTRAVENOUS | Status: DC | PRN
  Filled 2024-02-23: qty 10

## 2024-02-23 NOTE — Progress Notes (Unsigned)
 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  Ultrasound of the left axilla demonstrated normal lymph nodes.  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  8 mm.  Tumor was ER/PR -0% and HER2 positive +3 by IHC.    Menarche at the age of 38.  She is G5, P5.  Age at first birth 39.  She has used birth control pills in the past.  She attained menopause in her 53s.  Family history significant for breast cancer in 2 sisters of her maternal grandmother.  Mom with cervical cancer in her 61s.  Paternal uncle with stomach cancer.  Her sister has been diagnosed with some form of skin cancer.   Left lumpectomy pathology from 11/04/2023 showed 10 mm grade 3 tumor with negative margins.  3 sentinel lymph nodes negative for malignancy.  Tumor was ER/PR negative and HER2 positive  Interval history-fluid retention has improved after taking as needed Lasix and she has still been taking it on and off based on her body  weight.  Skin rash over her bilateral forearms persists.  Skin rash over her face is somewhat better.  She does report pain and sensitivity in her fingertips and it has been difficult for her to   ECOG PS- 1 Pain scale- 4 Opioid associated constipation- no  Review of systems- Review of Systems  Constitutional:  Positive for malaise/fatigue. Negative for chills, fever and weight loss.  HENT:  Negative for congestion, ear discharge and nosebleeds.   Eyes:  Negative for blurred vision.  Respiratory:  Negative for cough, hemoptysis, sputum production, shortness of breath and wheezing.   Cardiovascular:  Negative for chest pain, palpitations, orthopnea and claudication.  Gastrointestinal:  Negative for abdominal pain, blood in stool, constipation, diarrhea, heartburn, melena, nausea and vomiting.  Genitourinary:  Negative for dysuria, flank pain, frequency, hematuria and urgency.  Musculoskeletal:  Negative for back pain, joint pain and myalgias.  Skin:  Negative for rash.  Neurological:  Positive for sensory change (peripheral neuropathy). Negative for dizziness, tingling, focal weakness, seizures, weakness and headaches.  Endo/Heme/Allergies:  Does not bruise/bleed easily.  Psychiatric/Behavioral:  Negative for depression and suicidal ideas. The patient does not have insomnia.       Allergies  Allergen Reactions   Paclitaxel Shortness Of Breath, Other (See Comments) and Hypertension    Redness to face/chest Back Pain     Flagyl [Metronidazole] Itching     Past Medical History:  Diagnosis Date   Adenoma of appendix    Anemia    Chronic lower back pain  GERD (gastroesophageal reflux disease)    H/O gestational diabetes mellitus, not currently pregnant    History of kidney stones    Hypercholesteremia    Hyperplastic rectal polyp    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, estrogen receptor negative (HCC)    Obesity    Ovarian cyst    Palpitations     Sigmoid diverticulitis 08/2012   Subclinical hypothyroidism    SVT (supraventricular tachycardia) (HCC)    on Holter monitor 5 beat run of svt   Thyroid nodule    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   Type 2 diabetes mellitus (HCC)      Past Surgical History:  Procedure Laterality Date   AXILLARY SENTINEL NODE BIOPSY Left 11/04/2023   Procedure: AXILLARY SENTINEL NODE BIOPSY;  Surgeon: Campbell Lerner, MD;  Location: [facility] ORS;  Service: General;  Laterality: Left;   BLADDER SUSPENSION  2009   BREAST BIOPSY Left 10/22/2023   [place] Core Bx, heart clip - path pending   BREAST BIOPSY Left 10/22/2023   [place] LT BREAST BX W LOC DEV 1ST LESION IMG BX SPEC US GUIDE 10/22/2023 [facility]   BREAST LUMPECTOMY WITH RADIOFREQUENCY TAG IDENTIFICATION Left 11/04/2023   Procedure: BREAST LUMPECTOMY WITH RADIOFREQUENCY TAG IDENTIFICATION;  Surgeon: Campbell Lerner, MD;  Location: [facility] ORS;  Service: General;  Laterality: Left;   Colectomy Left    likely sigmoid, open.  for diverticulitis.   COLONOSCOPY WITH PROPOFOL N/A 10/01/2022   Procedure: COLONOSCOPY WITH PROPOFOL;  Surgeon: Toney Reil, MD;  Location: [facility];  Service: Gastroenterology;  Laterality: N/A;   PORTACATH PLACEMENT Right 11/04/2023   Procedure: INSERTION PORT-A-CATH;  Surgeon: Campbell Lerner, MD;  Location: [facility] ORS;  Service: General;  Laterality: Right;   TUBAL LIGATION     XI ROBOTIC LAPAROSCOPIC ASSISTED APPENDECTOMY N/A 12/29/2022   Procedure: XI ROBOTIC LYSIS OF ADHESIONS AND APPENDECTOMY;  Surgeon: Campbell Lerner, MD;  Location: [facility] OR[facility];  Service: General;  Laterality: N/A;    Social History   Socioeconomic History   Marital status: Single    Spouse name: Not on file   Number of children: Not on file   Years of education: Not on file   Highest education level: Some college, no degree  Occupational History   Not on file  Tobacco Use   Smoking status: Former    Current packs/day: 0.00    Types:  Cigarettes, Cigars    Quit date: 09/19/2011    Years since quitting: 12.4   Smokeless tobacco: Never  Vaping Use   Vaping status: Former  Substance and Sexual Activity   Alcohol use: Not Currently    Comment: Occasional glass of wine   Drug use: No   Sexual activity: Not Currently  Other Topics Concern   Not on file  Social History Narrative   Works in Audiological scientist at [employer].   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   Social Drivers of Corporate investment banker Strain: Low Risk  (06/17/2023)   Overall Financial Resource Strain (CARDIA)    Difficulty of Paying Living Expenses: Not very hard  Food Insecurity: No Food Insecurity (10/28/2023)   Hunger Vital Sign    Worried About Running Out of Food in the Last Year: Never true    Ran Out of Food in the Last Year: Never true  Transportation Needs: No Transportation Needs (10/28/2023)   PRAPARE - Administrator, Civil Service (Medical): No    Lack of Transportation (Non-Medical): No  Physical Activity: Sufficiently Active (06/17/2023)   Exercise Vital Sign    Days of Exercise per Week: 7 days    Minutes of Exercise per Session: 60 min  Stress: Stress Concern Present (06/17/2023)   [employer] of Occupational Health - Occupational Stress Questionnaire    Feeling of Stress : To some extent  Social Connections: Moderately Integrated (06/17/2023)   Social Connection and Isolation Panel [NHANES]    Frequency of Communication with Friends and Family: More than three times a week    Frequency of Social Gatherings with Friends and Family: Once a week    Attends Religious Services: More than 4 times per year    Active Member of [employer] or Organizations: Yes    Attends Banker Meetings: More than 4 times per year    Marital Status: Divorced  Intimate Partner Violence: Not At Risk (10/28/2023)   Humiliation, Afraid, Rape, and Kick questionnaire    Fear of Current or Ex-Partner: No    Emotionally  Abused: No    Physically Abused: No    Sexually Abused: No    Family History  Problem Relation Age of Onset   Cancer - Cervical Mother        dx 54s; Passed away from Kidney Issues (Not Cancer)   Heart disease Father    Kidney disease Father    Diabetes Father    Skin cancer Sister    Stomach cancer Paternal Uncle    Diabetes Maternal Grandmother    Skin cancer Maternal Grandfather    Diabetes Paternal Grandmother    Breast cancer Other        mat great aunts x2; unk age of dx   Lung cancer Cousin    Brain cancer Cousin      Current Outpatient Medications:    acetaminophen (TYLENOL) 500 MG tablet, Take 500 mg by mouth every 6 (six) hours as needed., Disp: , Rfl:    atenolol (TENORMIN) 25 MG tablet, TAKE 1 TABLET (25 MG TOTAL) BY MOUTH DAILY. FOR PALPITATIONS. (Patient taking differently: Take 25 mg by mouth at bedtime. For palpitations.), Disp: 90 tablet, Rfl: 2   azithromycin (ZITHROMAX Z-PAK) 250 MG tablet, 500mg  once for 1 day, then 250mg  once daily for 4 days (Patient not taking: Reported on 02/09/2024), Disp: 6 each, Rfl: 0   Biotin 1000 MCG tablet, Take 1,000 mcg by mouth daily., Disp: , Rfl:    blood glucose meter kit and supplies KIT, Dispense based on patient and insurance preference. Use up to four times daily as directed. (FOR ICD-9 250.00, 250.01)., Disp: 1 each, Rfl: 0   cyanocobalamin 500 MCG tablet, Take 1,000 mcg by mouth daily. Reported on 06/12/2016, Disp: , Rfl:    docusate sodium (COLACE) 100 MG capsule, Take 200 mg by mouth daily as needed for moderate constipation. Reported on 06/12/2016, Disp: , Rfl:    furosemide (LASIX) 20 MG tablet, Take 1 tablet (20 mg total) by mouth daily., Disp: 30 tablet, Rfl: 0   ibuprofen (ADVIL) 800 MG tablet, Take 1 tablet (800 mg total) by mouth every 8 (eight) hours as needed., Disp: 30 tablet, Rfl: 0   lidocaine-prilocaine (EMLA) cream, Apply to affected area once, Disp: 30 g, Rfl: 3   magnesium 30 MG tablet, Take 30 mg by mouth  daily as needed., Disp: , Rfl:    metFORMIN (GLUCOPHAGE-XR) 500 MG 24 hr tablet, TAKE 1 TABLET (500 MG TOTAL) BY MOUTH DAILY WITH BREAKFAST. FOR DIABETES., Disp: 90 tablet, Rfl: 0  ondansetron (ZOFRAN) 8 MG tablet, Take 1 tablet (8 mg total) by mouth every 8 (eight) hours as needed for nausea or vomiting., Disp: 30 tablet, Rfl: 1   potassium chloride SA (KLOR-CON M) 20 MEQ tablet, Take 1 tablet (20 mEq total) by mouth 2 (two) times daily., Disp: 14 tablet, Rfl: 0   prochlorperazine (COMPAZINE) 10 MG tablet, Take 1 tablet (10 mg total) by mouth every 6 (six) hours as needed for nausea or vomiting., Disp: 30 tablet, Rfl: 1   pyridOXINE (VITAMIN B6) 25 MG tablet, Take 25 mg by mouth daily., Disp: , Rfl:    rosuvastatin (CRESTOR) 5 MG tablet, TAKE 1 TABLET (5 MG TOTAL) BY MOUTH EVERY EVENING. FOR CHOLESTEROL., Disp: 90 tablet, Rfl: 2 No current facility-administered medications for this visit.  Facility-Administered Medications Ordered in Other Visits:    0.9 %  sodium chloride infusion, , Intravenous, Continuous, Creig Hines, MD, Stopped at 11/27/23 1158  Physical exam: There were no vitals filed for this visit. Physical Exam Cardiovascular:     Rate and Rhythm: Normal rate and regular rhythm.     Heart sounds: Normal heart sounds.  Pulmonary:     Effort: Pulmonary effort is normal.     Breath sounds: Normal breath sounds.  Abdominal:     General: Bowel sounds are normal.     Palpations: Abdomen is soft.  Skin:    General: Skin is warm and dry.  Neurological:     Mental Status: She is alert and oriented to person, place, and time.         Latest Ref Rng & Units 02/16/2024   10:32 AM  CMP  Glucose 70 - 99 mg/dL 536   BUN 8 - 23 mg/dL 11   Creatinine 6.44 - 1.00 mg/dL 0.34   Sodium 742 - 595 mmol/L 137   Potassium 3.5 - 5.1 mmol/L 4.0   Chloride 98 - 111 mmol/L 107   CO2 22 - 32 mmol/L 22   Calcium 8.9 - 10.3 mg/dL 8.6   Total Protein 6.5 - 8.1 g/dL 6.5   Total Bilirubin 0.0  - 1.2 mg/dL 0.7   Alkaline Phos 38 - 126 U/L 90   AST 15 - 41 U/L 61   ALT 0 - 44 U/L 34       Latest Ref Rng & Units 02/16/2024   10:32 AM  CBC  WBC 4.0 - 10.5 K/uL 3.8   Hemoglobin 12.0 - 15.0 g/dL 63.8   Hematocrit 75.6 - 46.0 % 32.3   Platelets 150 - 400 K/uL 311     No images are attached to the encounter.  ECHOCARDIOGRAM COMPLETE Result Date: 02/16/2024    ECHOCARDIOGRAM REPORT   Patient Name:   JAME SEELIG Taffe Date of Exam: 02/16/2024 Medical Rec #:  [id number]       Height:       65.0 in Accession #:    [id number]      Weight:       232.0 lb Date of Birth:  1961-01-04       BSA:          2.107 m Patient Age:    62 years        BP:           127/70 mmHg Patient Gender: F               HR:           99 bpm. Exam Location:  [facility] Procedure:  2D Echo, Strain Analysis, Color Doppler and Cardiac Doppler (Both            Spectral and Color Flow Doppler were utilized during procedure). Indications:     Chemo Z09  History:         Patient has prior history of Echocardiogram examinations, most                  recent 11/19/2023. Risk Factors:Diabetes. Palpitations.  Sonographer:     Cristela Blue Referring Phys:  [id number] [facility] Andalyn Heckstall Diagnosing Phys: Cristal Deer End MD  Sonographer Comments: Global longitudinal strain was attempted. IMPRESSIONS  1. Left ventricular ejection fraction, by estimation, is 60 to 65%. The left ventricle has normal function. The left ventricle has no regional wall motion abnormalities. Left ventricular diastolic parameters were normal.  2. Right ventricular systolic function is normal. The right ventricular size is normal. Tricuspid regurgitation signal is inadequate for assessing PA pressure.  3. The mitral valve is degenerative. Trivial mitral valve regurgitation. No evidence of mitral stenosis. Moderate mitral annular calcification.  4. The aortic valve was not well visualized. Aortic valve regurgitation is not visualized. No aortic stenosis is present. FINDINGS  Left  Ventricle: Left ventricular ejection fraction, by estimation, is 60 to 65%. The left ventricle has normal function. The left ventricle has no regional wall motion abnormalities. Global longitudinal strain performed but not reported based on interpreter judgement due to suboptimal tracking. The left ventricular internal cavity size was normal in size. There is no left ventricular hypertrophy. Left ventricular diastolic parameters were normal. Right Ventricle: The right ventricular size is normal. No increase in right ventricular wall thickness. Right ventricular systolic function is normal. Tricuspid regurgitation signal is inadequate for assessing PA pressure. Left Atrium: Left atrial size was normal in size. Right Atrium: Right atrial size was normal in size. Pericardium: There is no evidence of pericardial effusion. Mitral Valve: The mitral valve is degenerative in appearance. There is mild thickening of the mitral valve leaflet(s). Moderate mitral annular calcification. Trivial mitral valve regurgitation. No evidence of mitral valve stenosis. MV peak gradient, 8.8 mmHg. The mean mitral valve gradient is 5.0 mmHg. Tricuspid Valve: The tricuspid valve is normal in structure. Tricuspid valve regurgitation is trivial. Aortic Valve: The aortic valve was not well visualized. Aortic valve regurgitation is not visualized. No aortic stenosis is present. Aortic valve mean gradient measures 6.0 mmHg. Aortic valve peak gradient measures 10.4 mmHg. Aortic valve area, by VTI measures 2.92 cm. Pulmonic Valve: The pulmonic valve was not well visualized. Pulmonic valve regurgitation is not visualized. No evidence of pulmonic stenosis. Aorta: The aortic root is normal in size and structure. Pulmonary Artery: The pulmonary artery is not well seen. Venous: The inferior vena cava was not well visualized. IAS/Shunts: The interatrial septum was not well visualized.  LEFT VENTRICLE PLAX 2D LVIDd:         4.80 cm   Diastology LVIDs:          3.40 cm   LV e' medial:    9.14 cm/s LV PW:         0.87 cm   LV E/e' medial:  12.8 LV IVS:        0.87 cm   LV e' lateral:   9.68 cm/s LVOT diam:     2.00 cm   LV E/e' lateral: 12.1 LV SV:         83 LV SV Index:   39  2D Longitudinal Strain LVOT Area:     3.14 cm  2D Strain GLS Avg:     -12.9 %  RIGHT VENTRICLE RV Basal diam:  3.20 cm RV Mid diam:    2.90 cm RV S prime:     17.40 cm/s LEFT ATRIUM             Index        RIGHT ATRIUM           Index LA diam:        3.50 cm 1.66 cm/m   RA Area:     11.80 cm LA Vol (A2C):   42.0 ml 19.93 ml/m  RA Volume:   20.70 ml  9.82 ml/m LA Vol (A4C):   68.5 ml 32.51 ml/m LA Biplane Vol: 53.6 ml 25.44 ml/m  AORTIC VALVE AV Area (Vmax):    2.40 cm AV Area (Vmean):   2.40 cm AV Area (VTI):     2.92 cm AV Vmax:           161.00 cm/s AV Vmean:          116.000 cm/s AV VTI:            0.284 m AV Peak Grad:      10.4 mmHg AV Mean Grad:      6.0 mmHg LVOT Vmax:         123.00 cm/s LVOT Vmean:        88.700 cm/s LVOT VTI:          0.264 m LVOT/AV VTI ratio: 0.93  AORTA Ao Root diam: 2.70 cm MITRAL VALVE MV Area (PHT): 4.29 cm     SHUNTS MV Area VTI:   2.37 cm     Systemic VTI:  0.26 m MV Peak grad:  8.8 mmHg     Systemic Diam: 2.00 cm MV Mean grad:  5.0 mmHg MV Vmax:       1.48 m/s MV Vmean:      108.0 cm/s MV Decel Time: 177 msec MV E velocity: 117.00 cm/s MV A velocity: 111.00 cm/s MV E/A ratio:  1.05 Cristal Deer End MD Electronically signed by Yvonne Kendall MD Signature Date/Time: 02/16/2024/5:25:59 PM    Final      Assessment and plan- Patient is a 63 y.o. 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  She is here for on treatment assessment prior to cycle 10 of weekly Abraxane and Herceptin.  Patient reports worsening neuropathy in her hands and she finds it difficult to perform her day-to-day tasks.  I will hold off on giving her further  Abraxane at this time and continue Herceptin only every 3 weeks to complete 1 year of treatment.  She will no longer be getting Zarzio as she is not getting Abraxane either.  I will see her back in 3 weeks for next cycle of Herceptin and see how her symptoms of neuropathy and skin rash are doing.  If neuropathy continues to be a problem I will consider starting her on Neurontin or Lyrica at that time.   Visit Diagnosis 1. Encounter for antineoplastic chemotherapy   2. Chemotherapy induced neutropenia (HCC)   3. 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 in female, estrogen receptor negative (HCC)      Dr. Owens Shark, MD, MPH [facility] at [facility] [phone] 02/23/2024 8:36 AM

## 2024-02-23 NOTE — Patient Instructions (Signed)
 [facility] - A DEPT OF [facility]  Discharge Instructions: Thank you for choosing [facility] to provide your oncology and hematology care.  If you have a lab appointment with the Cancer Center, please go directly to the Cancer Center and check in at the registration area.  Wear comfortable clothing and clothing appropriate for easy access to any Portacath or PICC line.   We strive to give you quality time with your provider. You may need to reschedule your appointment if you arrive late (15 or more minutes).  Arriving late affects you and other patients whose appointments are after yours.  Also, if you miss three or more appointments without notifying the office, you may be dismissed from the clinic at the provider's discretion.      For prescription refill requests, have your pharmacy contact our office and allow 72 hours for refills to be completed.    Today you received the following chemotherapy and/or immunotherapy agents kanjinti      To help prevent nausea and vomiting after your treatment, we encourage you to take your nausea medication as directed.  BELOW ARE SYMPTOMS THAT SHOULD BE REPORTED IMMEDIATELY: *FEVER GREATER THAN 100.4 F (38 C) OR HIGHER *CHILLS OR SWEATING *NAUSEA AND VOMITING THAT IS NOT CONTROLLED WITH YOUR NAUSEA MEDICATION *UNUSUAL SHORTNESS OF BREATH *UNUSUAL BRUISING OR BLEEDING *URINARY PROBLEMS (pain or burning when urinating, or frequent urination) *BOWEL PROBLEMS (unusual diarrhea, constipation, pain near the anus) TENDERNESS IN MOUTH AND THROAT WITH OR WITHOUT PRESENCE OF ULCERS (sore throat, sores in mouth, or a toothache) UNUSUAL RASH, SWELLING OR PAIN  UNUSUAL VAGINAL DISCHARGE OR ITCHING   Items with * indicate a potential emergency and should be followed up as soon as possible or go to the Emergency Department if any problems should occur.  Please show the CHEMOTHERAPY ALERT CARD or IMMUNOTHERAPY  ALERT CARD at check-in to the Emergency Department and triage nurse.  Should you have questions after your visit or need to cancel or reschedule your appointment, please contact [facility] - A DEPT OF [facility]  [phone] and follow the prompts.  Office hours are 8:00 a.m. to 4:30 p.m. Monday - Friday. Please note that voicemails left after 4:00 p.m. may not be returned until the following business day.  We are closed weekends and major holidays. You have access to a nurse at all times for urgent questions. Please call the main number to the clinic [phone] and follow the prompts.  For any non-urgent questions, you may also contact your provider using MyChart. We now offer e-Visits for anyone 73 and older to request care online for non-urgent symptoms. For details visit [email or web address].   Also download the MyChart app! Go to the app store, search "MyChart", open the app, select [place], and log in with your MyChart username and password.

## 2024-02-24 ENCOUNTER — Inpatient Hospital Stay: Payer: Commercial Managed Care - PPO

## 2024-02-24 ENCOUNTER — Other Ambulatory Visit: Payer: Self-pay

## 2024-02-25 ENCOUNTER — Encounter: Payer: Self-pay | Admitting: Oncology

## 2024-02-25 ENCOUNTER — Ambulatory Visit: Admitting: Primary Care

## 2024-02-25 ENCOUNTER — Encounter: Payer: Self-pay | Admitting: Primary Care

## 2024-02-25 ENCOUNTER — Encounter: Payer: Self-pay | Admitting: Internal Medicine

## 2024-02-25 ENCOUNTER — Inpatient Hospital Stay: Payer: Commercial Managed Care - PPO

## 2024-02-25 VITALS — BP 130/72 | HR 90 | Temp 97.2°F | Ht 65.0 in | Wt 224.0 lb

## 2024-02-25 DIAGNOSIS — Z7984 Long term (current) use of oral hypoglycemic drugs: Secondary | ICD-10-CM

## 2024-02-25 DIAGNOSIS — G629 Polyneuropathy, unspecified: Secondary | ICD-10-CM | POA: Diagnosis not present

## 2024-02-25 DIAGNOSIS — E119 Type 2 diabetes mellitus without complications: Secondary | ICD-10-CM | POA: Diagnosis not present

## 2024-02-25 LAB — POCT GLYCOSYLATED HEMOGLOBIN (HGB A1C): Hemoglobin A1C: 7.6 % — AB (ref 4.0–5.6)

## 2024-02-25 MED ORDER — GABAPENTIN 100 MG PO CAPS: 100.0000 mg | ORAL_CAPSULE | Freq: Every day | ORAL | 0 refills | Status: DC

## 2024-02-25 MED ORDER — OZEMPIC (0.25 OR 0.5 MG/DOSE) 2 MG/3ML ~~LOC~~ SOPN
PEN_INJECTOR | SUBCUTANEOUS | 1 refills | Status: DC
Start: 2024-02-25 — End: 2024-08-13

## 2024-02-25 NOTE — Assessment & Plan Note (Signed)
 Deteriorated with A1C today of 7.6.  Start semaglutide (Ozempic) for diabetes. Start by injecting 0.25 mg into the skin once weekly for 4 weeks, then increase to 0.5 mg once weekly thereafter.  Add gabapentin 100-200 mg HS. Drowsiness precautions provided.   Continue metformin ER 500 mg daily.  Foot exam today.  Follow up in 6 months.

## 2024-02-25 NOTE — Patient Instructions (Addendum)
 Start semaglutide (Ozempic) for diabetes. Start by injecting 0.25 mg into the skin once weekly for 4 weeks, then increase to 0.5 mg once weekly thereafter.   Continue taking metformin daily.  Start gabapentin 100 mg capsules for neuropathy.  Take 1 to 2 capsules by mouth at bedtime.  This may cause drowsiness.  Schedule a lab only appointment for 3 months to repeat your A1c level.  Please schedule a physical to meet with me in 6 months.   It was a pleasure to see you today!

## 2024-02-25 NOTE — Progress Notes (Signed)
 Subjective:    Patient ID: Rebecca Macias, female    DOB: [birth year], 62 y.o.   MRN: [id number]  HPI  Rebecca Macias is a very pleasant 63 y.o. female with a history of type 2 diabetes, breast cancer, hypothyroidism, palpitations who presents today for follow-up of diabetes.  Current medications include: Metformin ER 500 mg daily. Previously managed on Ozempic but she stopped last year due to her chemotherapy.   She has chronic bilateral foot neuropathy which has been exacerbated over the last 1 year after chemotherapy treatment. She has difficulty falling asleep at night due to her neuropathy pain. She is interested in treatment.   She is checking her blood glucose 1 times weekly and is getting readings in the mid 200s.   Last A1C: 6.9 in September 2024, 7.6 today Last Eye Exam: Up-to-date Last Foot Exam: Due Pneumonia Vaccination: 2024 Urine Microalbumin: Up-to-date Statin: Rosuvastatin  Dietary changes since last visit: None. Limiting sweets, pasta, junk food. Her taste buds have been altered since chemotherapy.    Exercise: None  BP Readings from Last 3 Encounters:  02/25/24 130/72  02/23/24 133/67  02/16/24 (!) 138/58   Wt Readings from Last 3 Encounters:  02/25/24 224 lb (101.6 kg)  02/23/24 225 lb 4.8 oz (102.2 kg)  02/16/24 220 lb 7.4 oz (100 kg)     Review of Systems  Respiratory:  Negative for shortness of breath.   Cardiovascular:  Positive for leg swelling. Negative for chest pain.  Neurological:  Negative for dizziness and numbness.         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   Type 2 diabetes mellitus (HCC)     Social History   Socioeconomic History   Marital status: Single    Spouse name: Not on file   Number of children: Not on file   Years of education: Not on file   Highest education level: Some college, no degree  Occupational History   Not on file  Tobacco Use   Smoking status: Former    Current packs/day: 0.00    Types: Cigarettes, Cigars    Quit date: 09/19/2011    Years since quitting: 12.4   Smokeless tobacco: Never  Vaping Use   Vaping status: Former  Substance and Sexual Activity   Alcohol use: Not Currently    Comment: Occasional glass of wine   Drug use: No   Sexual activity: Not Currently  Other Topics Concern   Not on file  Social History Narrative   Works in Audiological scientist at [employer].   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   Social Drivers of Corporate investment banker Strain: Low Risk  (06/17/2023)   Overall Financial Resource Strain (CARDIA)    Difficulty of Paying Living Expenses: Not very hard  Food  Insecurity: No Food Insecurity (10/28/2023)   Hunger Vital Sign    Worried About Running Out of Food in the Last Year: Never true    Ran Out of Food in the Last Year: Never true  Transportation Needs: No Transportation Needs (10/28/2023)   PRAPARE - Administrator, Civil Service (Medical): No    Lack of Transportation (Non-Medical): No  Physical Activity: Sufficiently Active (06/17/2023)   Exercise Vital Sign    Days of Exercise per Week: 7 days    Minutes of Exercise per Session: 60 min  Stress: Stress Concern Present (06/17/2023)   [employer] of Occupational Health - Occupational Stress Questionnaire    Feeling of Stress : To some extent  Social Connections: Moderately Integrated  (06/17/2023)   Social Connection and Isolation Panel [NHANES]    Frequency of Communication with Friends and Family: More than three times a week    Frequency of Social Gatherings with Friends and Family: Once a week    Attends Religious Services: More than 4 times per year    Active Member of [employer] or Organizations: Yes    Attends Banker Meetings: More than 4 times per year    Marital Status: Divorced  Intimate Partner Violence: Not At Risk (10/28/2023)   Humiliation, Afraid, Rape, and Kick questionnaire    Fear of Current or Ex-Partner: No    Emotionally Abused: No    Physically Abused: No    Sexually Abused: No    Past Surgical History:  Procedure Laterality Date   AXILLARY SENTINEL NODE BIOPSY Left 11/04/2023   Procedure: AXILLARY SENTINEL NODE BIOPSY;  Surgeon: Campbell Lerner, MD;  Location: [facility];  Service: General;  Laterality: Left;   BLADDER SUSPENSION  2009   BREAST BIOPSY Left 10/22/2023   [place] Core Bx, heart clip - path pending   BREAST BIOPSY Left 10/22/2023   [place] LT BREAST BX W LOC DEV 1ST LESION IMG BX SPEC US GUIDE 10/22/2023 [facility]   BREAST LUMPECTOMY WITH RADIOFREQUENCY TAG IDENTIFICATION Left 11/04/2023   Procedure: BREAST LUMPECTOMY WITH RADIOFREQUENCY TAG IDENTIFICATION;  Surgeon: Campbell Lerner, MD;  Location: [facility];  Service: General;  Laterality: Left;   Colectomy Left    likely sigmoid, open.  for diverticulitis.   COLONOSCOPY WITH PROPOFOL N/A 10/01/2022   Procedure: COLONOSCOPY WITH PROPOFOL;  Surgeon: Toney Reil, MD;  Location: [facility] ENDOSCOPY;  Service: Gastroenterology;  Laterality: N/A;   PORTACATH PLACEMENT Right 11/04/2023   Procedure: INSERTION PORT-A-CATH;  Surgeon: Campbell Lerner, MD;  Location: [facility];  Service: General;  Laterality: Right;   TUBAL LIGATION     XI ROBOTIC LAPAROSCOPIC ASSISTED APPENDECTOMY N/A 12/29/2022   Procedure: XI ROBOTIC LYSIS OF ADHESIONS AND APPENDECTOMY;  Surgeon: Campbell Lerner, MD;  Location: [facility];  Service: General;  Laterality: N/A;    Family History  Problem Relation Age of Onset   Cancer - Cervical Mother        dx 57s; Passed away from Kidney Issues (Not Cancer)   Heart disease Father    Kidney disease Father    Diabetes Father    Skin cancer Sister    Stomach cancer Paternal Uncle    Diabetes Maternal Grandmother    Skin cancer Maternal Grandfather    Diabetes Paternal Grandmother    Breast cancer Other        mat great aunts x2; unk age of dx   Lung cancer Cousin    Brain cancer Cousin  Allergies  Allergen Reactions   Paclitaxel Shortness Of Breath, Other (See Comments) and Hypertension    Redness to face/chest Back Pain     Flagyl [Metronidazole] Itching    Current Outpatient Medications on File Prior to Visit  Medication Sig Dispense Refill   acetaminophen (TYLENOL) 500 MG tablet Take 500 mg by mouth every 6 (six) hours as needed.     atenolol (TENORMIN) 25 MG tablet TAKE 1 TABLET (25 MG TOTAL) BY MOUTH DAILY. FOR PALPITATIONS. (Patient taking differently: Take 25 mg by mouth at bedtime. For palpitations.) 90 tablet 2   Biotin 1000 MCG tablet Take 1,000 mcg by mouth daily.     blood glucose meter kit and supplies KIT Dispense based on patient and insurance preference. Use up to four times daily as directed. (FOR ICD-9 250.00, 250.01). 1 each 0   cyanocobalamin 500 MCG tablet Take 1,000 mcg by mouth daily. Reported on 06/12/2016     docusate sodium (COLACE) 100 MG capsule Take 200 mg by mouth daily as needed for moderate constipation. Reported on 06/12/2016     furosemide (LASIX) 20 MG tablet Take 1 tablet (20 mg total) by mouth daily. 30 tablet 0   lidocaine-prilocaine (EMLA) cream Apply to affected area once 30 g 3   metFORMIN (GLUCOPHAGE-XR) 500 MG 24 hr tablet TAKE 1 TABLET (500 MG TOTAL) BY MOUTH DAILY WITH BREAKFAST. FOR DIABETES. 90 tablet 0   ondansetron (ZOFRAN) 8 MG tablet Take 1 tablet (8 mg total) by mouth every 8  (eight) hours as needed for nausea or vomiting. 30 tablet 1   potassium chloride SA (KLOR-CON M) 20 MEQ tablet Take 1 tablet (20 mEq total) by mouth 2 (two) times daily. 14 tablet 0   prochlorperazine (COMPAZINE) 10 MG tablet Take 1 tablet (10 mg total) by mouth every 6 (six) hours as needed for nausea or vomiting. 30 tablet 1   pyridOXINE (VITAMIN B6) 25 MG tablet Take 25 mg by mouth daily.     rosuvastatin (CRESTOR) 5 MG tablet TAKE 1 TABLET (5 MG TOTAL) BY MOUTH EVERY EVENING. FOR CHOLESTEROL. 90 tablet 2   azithromycin (ZITHROMAX Z-PAK) 250 MG tablet 500mg  once for 1 day, then 250mg  once daily for 4 days (Patient not taking: Reported on 02/25/2024) 6 each 0   ibuprofen (ADVIL) 800 MG tablet Take 1 tablet (800 mg total) by mouth every 8 (eight) hours as needed. (Patient not taking: Reported on 02/25/2024) 30 tablet 0   magnesium 30 MG tablet Take 30 mg by mouth daily as needed. (Patient not taking: Reported on 02/25/2024)     Current Facility-Administered Medications on File Prior to Visit  Medication Dose Route Frequency Provider Last Rate Last Admin   0.9 %  sodium chloride infusion   Intravenous Continuous Creig Hines, MD   Stopped at 11/27/23 1158    BP 130/72   Pulse 90   Temp (!) 97.2 F (36.2 C) (Temporal)   Ht 5\' 5"  (1.651 m)   Wt 224 lb (101.6 kg)   LMP 11/03/2012   SpO2 97%   BMI 37.28 kg/m  Objective:   Physical Exam Cardiovascular:     Rate and Rhythm: Normal rate and regular rhythm.  Pulmonary:     Effort: Pulmonary effort is normal.     Breath sounds: Normal breath sounds.  Musculoskeletal:     Cervical back: Neck supple.  Skin:    General: Skin is warm and dry.  Neurological:     Mental Status: She is  alert and oriented to person, place, and time.  Psychiatric:        Mood and Affect: Mood normal.           Assessment & Plan:  Non-insulin dependent type 2 diabetes mellitus (HCC) Assessment & Plan: Deteriorated with A1C today of 7.6.  Start  semaglutide (Ozempic) for diabetes. Start by injecting 0.25 mg into the skin once weekly for 4 weeks, then increase to 0.5 mg once weekly thereafter.  Add gabapentin 100-200 mg HS. Drowsiness precautions provided.   Continue metformin ER 500 mg daily.  Foot exam today.  Follow up in 6 months.   Orders: -     POCT glycosylated hemoglobin (Hb A1C) -     Ozempic (0.25 or 0.5 MG/DOSE); Inject 0.25 mg into the skin once weekly for 4 weeks, then increase to 0.5 mg once weekly thereafter for diabetes.  Dispense: 9 mL; Refill: 1  Neuropathy Assessment & Plan: Start gabapentin 100-200 mg HS. Drowsiness precautions provided.  Orders: -     Gabapentin; Take 1-2 capsules (100-200 mg total) by mouth at bedtime. For neuropathy  Dispense: 180 capsule; Refill: 0        Doreene Nest, NP

## 2024-02-25 NOTE — Assessment & Plan Note (Signed)
 Start gabapentin 100-200 mg HS. Drowsiness precautions provided.

## 2024-02-26 ENCOUNTER — Other Ambulatory Visit: Payer: Self-pay

## 2024-02-26 ENCOUNTER — Inpatient Hospital Stay: Payer: Commercial Managed Care - PPO

## 2024-02-26 MED ORDER — PREDNISONE 10 MG PO TABS: 10.0000 mg | ORAL_TABLET | Freq: Every day | ORAL | 0 refills | Status: DC

## 2024-02-26 NOTE — Telephone Encounter (Signed)
 Send her a course of prednisone starting at 50 mg and tapering by 10 mg every 2 days to stop the course in about 8-10 days. Call if no improvement in symptoms

## 2024-02-29 ENCOUNTER — Encounter: Payer: Self-pay | Admitting: *Deleted

## 2024-02-29 ENCOUNTER — Encounter: Payer: Self-pay | Admitting: Internal Medicine

## 2024-02-29 ENCOUNTER — Encounter: Payer: Self-pay | Admitting: Oncology

## 2024-03-01 ENCOUNTER — Ambulatory Visit

## 2024-03-02 ENCOUNTER — Ambulatory Visit

## 2024-03-03 ENCOUNTER — Other Ambulatory Visit: Payer: Self-pay | Admitting: Primary Care

## 2024-03-03 ENCOUNTER — Ambulatory Visit

## 2024-03-03 DIAGNOSIS — E119 Type 2 diabetes mellitus without complications: Secondary | ICD-10-CM

## 2024-03-04 ENCOUNTER — Ambulatory Visit

## 2024-03-08 ENCOUNTER — Other Ambulatory Visit

## 2024-03-08 ENCOUNTER — Ambulatory Visit

## 2024-03-08 ENCOUNTER — Ambulatory Visit: Admitting: Oncology

## 2024-03-09 ENCOUNTER — Ambulatory Visit

## 2024-03-09 ENCOUNTER — Other Ambulatory Visit: Payer: Self-pay | Admitting: Nurse Practitioner

## 2024-03-10 ENCOUNTER — Ambulatory Visit
Admission: RE | Admit: 2024-03-10 | Discharge: 2024-03-10 | Disposition: A | Discharge status: Home / Self Care | Source: Ambulatory Visit | Attending: Radiation Oncology | Admitting: Radiation Oncology

## 2024-03-10 ENCOUNTER — Encounter: Payer: Self-pay | Admitting: Oncology

## 2024-03-10 ENCOUNTER — Encounter: Payer: Self-pay | Admitting: Radiation Oncology

## 2024-03-10 ENCOUNTER — Ambulatory Visit

## 2024-03-10 VITALS — BP 133/77 | HR 96 | Temp 98.7°F | Resp 20 | Wt 211.0 lb

## 2024-03-10 DIAGNOSIS — C50312 Malignant neoplasm of lower-inner quadrant of left female breast: Secondary | ICD-10-CM | POA: Insufficient documentation

## 2024-03-10 DIAGNOSIS — Z5112 Encounter for antineoplastic immunotherapy: Secondary | ICD-10-CM | POA: Insufficient documentation

## 2024-03-10 DIAGNOSIS — Z1722 Progesterone receptor negative status: Secondary | ICD-10-CM | POA: Insufficient documentation

## 2024-03-10 DIAGNOSIS — Z1732 Human epidermal growth factor receptor 2 negative status: Secondary | ICD-10-CM | POA: Insufficient documentation

## 2024-03-10 DIAGNOSIS — Z510 Encounter for antineoplastic radiation therapy: Secondary | ICD-10-CM | POA: Diagnosis present

## 2024-03-10 DIAGNOSIS — Z171 Estrogen receptor negative status [ER-]: Secondary | ICD-10-CM | POA: Diagnosis not present

## 2024-03-10 NOTE — Consult Note (Signed)
 NEW PATIENT EVALUATION  Name: Rebecca Macias  MRN: [id number]  Date:   03/10/2024     DOB: [birth year]   This 63 y.o. 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  REFERRING PHYSICIAN: Creig Hines, MD  CHIEF COMPLAINT:  Chief Complaint  Patient presents with   Breast Cancer    DIAGNOSIS: The encounter diagnosis was 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 in female, estrogen receptor negative (HCC).   PREVIOUS INVESTIGATIONS:  Mammograms ultrasound reviewed Pathology reports reviewed Clinical notes reviewed  HPI: Patient is a 63 year old female who presented with an abnormal mammogram of the left breast.  There was asymmetry in the right breast at the 9 o'clock position which on ultrasound was hypoechoic measuring 0.6 x 0.5 x 0.4 cm.  Ultrasound-guided biopsy was positive for invasive mammary carcinoma ER/PR negative HER2/neu overexpressed.  Axillary ultrasound was negative.  She underwent wide local excision for invasive mammary carcinoma no special type measuring 1 cm overall grade 3.  Margins were close at 0.4 mm for the invasive component there was DCIS present with a 0.1 mm margin.  3 sentinel lymph nodes were examined all negative for metastatic disease.  Again tumor was ER/PR negative HER2/neu overexpressed this prompted systemic chemotherapy.  She was treated with Abraxane and Herceptin and received 9 out of 10 cycles.  She developed peripheral neuropathy and Abraxane was held.  She is currently on Herceptin treatment.  She is doing well specifically denies any breast tenderness cough or bone pain.  She did have chemotherapy induced neutropenia.  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    PAST SURGICAL HISTORY:  Past Surgical History:  Procedure Laterality Date   AXILLARY SENTINEL NODE BIOPSY Left 11/04/2023   Procedure: AXILLARY SENTINEL NODE BIOPSY;  Surgeon: Campbell Lerner, MD;  Location: [facility];  Service: General;  Laterality: Left;   BLADDER SUSPENSION  2009   BREAST BIOPSY Left 10/22/2023   [place] Core Bx, heart clip - path pending   BREAST BIOPSY Left 10/22/2023   [place] LT BREAST BX W LOC DEV 1ST LESION IMG BX SPEC US GUIDE 10/22/2023 [facility]   BREAST LUMPECTOMY WITH RADIOFREQUENCY TAG IDENTIFICATION Left 11/04/2023   Procedure: BREAST LUMPECTOMY WITH RADIOFREQUENCY TAG IDENTIFICATION;  Surgeon: Campbell Lerner, MD;  Location: [facility];  Service: General;  Laterality: Left;   Colectomy Left    likely sigmoid, open.  for diverticulitis.   COLONOSCOPY WITH PROPOFOL N/A 10/01/2022   Procedure: COLONOSCOPY WITH PROPOFOL;  Surgeon: Toney Reil, MD;  Location[facility] ENDOSCOPY;  Service: Gastroenterology;  Laterality: N/A;   PORTACATH PLACEMENT Right 11/04/2023   Procedure: INSERTION PORT-A-CATH;  Surgeon: Campbell Lerner, MD;  Location: [facility];  Service: General;  Laterality: Right;   TUBAL LIGATION     XI ROBOTIC LAPAROSCOPIC ASSISTED APPENDECTOMY N/A 12/29/2022   Procedure: XI ROBOTIC LYSIS OF ADHESIONS AND APPENDECTOMY;  Surgeon:  Campbell Lerner, MD;  Location: [facility];  Service: General;  Laterality:  N/A;    FAMILY HISTORY: family history includes Brain cancer in her cousin; Breast cancer in an other family member; Cancer - Cervical in her mother; Diabetes in her father, maternal grandmother, and paternal grandmother; Heart disease in her father; Kidney disease in her father; Lung cancer in her cousin; Skin cancer in her maternal grandfather and sister; Stomach cancer in her paternal uncle.  SOCIAL HISTORY:  reports that she quit smoking about 12 years ago. Her smoking use included cigarettes and cigars. She has never used smokeless tobacco. She reports that she does not currently use alcohol. She reports that she does not use drugs.  ALLERGIES: Paclitaxel and Flagyl [metronidazole]  MEDICATIONS:  Current Outpatient Medications  Medication Sig Dispense Refill   acetaminophen (TYLENOL) 500 MG tablet Take 500 mg by mouth every 6 (six) hours as needed.     atenolol (TENORMIN) 25 MG tablet TAKE 1 TABLET (25 MG TOTAL) BY MOUTH DAILY. FOR PALPITATIONS. (Patient taking differently: Take 25 mg by mouth at bedtime. For palpitations.) 90 tablet 2   Biotin 1000 MCG tablet Take 1,000 mcg by mouth daily.     blood glucose meter kit and supplies KIT Dispense based on patient and insurance preference. Use up to four times daily as directed. (FOR ICD-9 250.00, 250.01). 1 each 0   cyanocobalamin 500 MCG tablet Take 1,000 mcg by mouth daily. Reported on 06/12/2016     docusate sodium (COLACE) 100 MG capsule Take 200 mg by mouth daily as needed for moderate constipation. Reported on 06/12/2016     furosemide (LASIX) 20 MG tablet Take 1 tablet (20 mg total) by mouth daily. 30 tablet 0   gabapentin (NEURONTIN) 100 MG capsule Take 1-2 capsules (100-200 mg total) by mouth at bedtime. For neuropathy 180 capsule 0   ibuprofen (ADVIL) 800 MG tablet Take 1 tablet (800 mg total) by mouth every 8 (eight) hours as needed. 30 tablet 0   lidocaine-prilocaine (EMLA) cream Apply to affected area once 30 g 3   magnesium 30 MG  tablet Take 30 mg by mouth daily as needed.     metFORMIN (GLUCOPHAGE-XR) 500 MG 24 hr tablet TAKE 1 TABLET (500 MG TOTAL) BY MOUTH DAILY WITH BREAKFAST. FOR DIABETES. 90 tablet 1   ondansetron (ZOFRAN) 8 MG tablet Take 1 tablet (8 mg total) by mouth every 8 (eight) hours as needed for nausea or vomiting. 30 tablet 1   potassium chloride SA (KLOR-CON M) 20 MEQ tablet Take 1 tablet (20 mEq total) by mouth 2 (two) times daily. 14 tablet 0   predniSONE (DELTASONE) 10 MG tablet Take 1 tablet (10 mg total) by mouth daily with breakfast. 30 tablet 0   prochlorperazine (COMPAZINE) 10 MG tablet Take 1 tablet (10 mg total) by mouth every 6 (six) hours as needed for nausea or vomiting. 30 tablet 1   pyridOXINE (VITAMIN B6) 25 MG tablet Take 25 mg by mouth daily.     rosuvastatin (CRESTOR) 5 MG tablet TAKE 1 TABLET (5 MG TOTAL) BY MOUTH EVERY EVENING. FOR CHOLESTEROL. 90 tablet 2   Semaglutide,0.25 or 0.5MG /DOS, (OZEMPIC, 0.25 OR 0.5 MG/DOSE,) 2 MG/3ML SOPN Inject 0.25 mg into the skin once weekly for 4 weeks, then increase to 0.5 mg once weekly thereafter for diabetes. 9 mL 1   No current facility-administered medications for this encounter.   Facility-Administered Medications Ordered in Other Encounters  Medication Dose Route Frequency Provider Last Rate Last  Admin   0.9 %  sodium chloride infusion   Intravenous Continuous Creig Hines, MD   Stopped at 11/27/23 1158    ECOG PERFORMANCE STATUS:  0 - Asymptomatic  REVIEW OF SYSTEMS: Patient denies any weight loss, fatigue, weakness, fever, chills or night sweats. Patient denies any loss of vision, blurred vision. Patient denies any ringing  of the ears or hearing loss. No irregular heartbeat. Patient denies heart murmur or history of fainting. Patient denies any chest pain or pain radiating to her upper extremities. Patient denies any shortness of breath, difficulty breathing at night, cough or hemoptysis. Patient denies any swelling in the lower legs.  Patient denies any nausea vomiting, vomiting of blood, or coffee ground material in the vomitus. Patient denies any stomach pain. Patient states has had normal bowel movements no significant constipation or diarrhea. Patient denies any dysuria, hematuria or significant nocturia. Patient denies any problems walking, swelling in the joints or loss of balance. Patient denies any skin changes, loss of hair or loss of weight. Patient denies any excessive worrying or anxiety or significant depression. Patient denies any problems with insomnia. Patient denies excessive thirst, polyuria, polydipsia. Patient denies any swollen glands, patient denies easy bruising or easy bleeding. Patient denies any recent infections, allergies or URI. Patient "s visual fields have not changed significantly in recent time.   PHYSICAL EXAM: BP 133/77   Pulse 96   Temp 98.7 F (37.1 C) (Tympanic)   Resp 20   Wt 211 lb (95.7 kg)   LMP 11/03/2012   BMI 35.11 kg/m  She has a incision around the nipple areolar complex which is well-healed no dominant masses noted in either breast.  No axillary or supraclavicular adenopathy is identified.  Well-developed well-nourished patient in NAD. HEENT reveals PERLA, EOMI, discs not visualized.  Oral cavity is clear. No oral mucosal lesions are identified. Neck is clear without evidence of cervical or supraclavicular adenopathy. Lungs are clear to A&P. Cardiac examination is essentially unremarkable with regular rate and rhythm without murmur rub or thrill. Abdomen is benign with no organomegaly or masses noted. Motor sensory and DTR levels are equal and symmetric in the upper and lower extremities. Cranial nerves II through XII are grossly intact. Proprioception is intact. No peripheral adenopathy or edema is identified. No motor or sensory levels are noted. Crude visual fields are within normal range.  LABORATORY DATA: Pathology reports reviewed    RADIOLOGY RESULTS: Mammogram and ultrasound  reviewed compatible with above-stated findings   IMPRESSION: Stage Ia ER/PR negative HER2/neu overexpressed invasive mammary carcinoma the left breast status post wide local excision sentinel node biopsy and adjuvant chemotherapy in 63 year old female  PLAN: This time patient will continue on Herceptin.  I have recommended whole breast radiation and a hypofractionated course of treatment over 3 weeks.  Would also boost her scar 1600 centigrade based on the close margin both for the invasive component and the in situ component.  Risks and benefits of treatment including skin reaction fatigue alteration blood counts possible inclusion of superficial lung all were reviewed in detail with the patient.  We will try to do deep inspiration breath-hold to move her heart as far out of her treatment as far as of her treatment field as possible.  I have personally set up and ordered CT simulation for next week.  Patient comprehends my recommendations well.  I would like to take this opportunity to thank you for allowing me to participate in the care of your patient.Marland Kitchen  Carmina Miller, MD

## 2024-03-11 ENCOUNTER — Encounter: Payer: Self-pay | Admitting: Oncology

## 2024-03-11 ENCOUNTER — Encounter: Payer: Self-pay | Admitting: Internal Medicine

## 2024-03-11 ENCOUNTER — Ambulatory Visit

## 2024-03-15 ENCOUNTER — Encounter: Payer: Self-pay | Admitting: Oncology

## 2024-03-15 ENCOUNTER — Ambulatory Visit
Admission: RE | Admit: 2024-03-15 | Discharge: 2024-03-15 | Disposition: A | Discharge status: Home / Self Care | Source: Ambulatory Visit | Attending: Radiation Oncology | Admitting: Radiation Oncology

## 2024-03-15 ENCOUNTER — Inpatient Hospital Stay: Admitting: Oncology

## 2024-03-15 ENCOUNTER — Encounter: Payer: Self-pay | Admitting: *Deleted

## 2024-03-15 ENCOUNTER — Inpatient Hospital Stay

## 2024-03-15 ENCOUNTER — Other Ambulatory Visit

## 2024-03-15 VITALS — BP 121/59 | HR 90 | Temp 96.8°F | Resp 19 | Ht 65.0 in | Wt 211.9 lb

## 2024-03-15 VITALS — BP 124/65 | HR 77

## 2024-03-15 DIAGNOSIS — Z5112 Encounter for antineoplastic immunotherapy: Secondary | ICD-10-CM | POA: Insufficient documentation

## 2024-03-15 DIAGNOSIS — C50312 Malignant neoplasm of lower-inner quadrant of left female breast: Secondary | ICD-10-CM

## 2024-03-15 DIAGNOSIS — G620 Drug-induced polyneuropathy: Secondary | ICD-10-CM | POA: Diagnosis not present

## 2024-03-15 DIAGNOSIS — Z79899 Other long term (current) drug therapy: Secondary | ICD-10-CM | POA: Insufficient documentation

## 2024-03-15 DIAGNOSIS — Z1722 Progesterone receptor negative status: Secondary | ICD-10-CM | POA: Insufficient documentation

## 2024-03-15 DIAGNOSIS — Z510 Encounter for antineoplastic radiation therapy: Secondary | ICD-10-CM | POA: Diagnosis not present

## 2024-03-15 DIAGNOSIS — T451X5A Adverse effect of antineoplastic and immunosuppressive drugs, initial encounter: Secondary | ICD-10-CM | POA: Diagnosis not present

## 2024-03-15 DIAGNOSIS — Z171 Estrogen receptor negative status [ER-]: Secondary | ICD-10-CM | POA: Insufficient documentation

## 2024-03-15 DIAGNOSIS — Z1731 Human epidermal growth factor receptor 2 positive status: Secondary | ICD-10-CM | POA: Insufficient documentation

## 2024-03-15 MED ORDER — HEPARIN SOD (PORK) LOCK FLUSH 100 UNIT/ML IV SOLN
500.0000 [IU] | Freq: Once | INTRAVENOUS | Status: AC | PRN
  Administered 2024-03-15: 500 [IU]
  Filled 2024-03-15: qty 5

## 2024-03-15 MED ORDER — TRASTUZUMAB-ANNS CHEMO 150 MG IV SOLR
6.0000 mg/kg | Freq: Once | INTRAVENOUS | Status: AC
  Administered 2024-03-15: 600 mg via INTRAVENOUS
  Filled 2024-03-15: qty 28.57

## 2024-03-15 MED ORDER — ACETAMINOPHEN 325 MG PO TABS
650.0000 mg | ORAL_TABLET | Freq: Once | ORAL | Status: AC
  Administered 2024-03-15: 650 mg via ORAL
  Filled 2024-03-15: qty 2

## 2024-03-15 MED ORDER — DIPHENHYDRAMINE HCL 25 MG PO CAPS
25.0000 mg | ORAL_CAPSULE | Freq: Once | ORAL | Status: AC
  Administered 2024-03-15: 25 mg via ORAL
  Filled 2024-03-15: qty 1

## 2024-03-15 MED ORDER — SODIUM CHLORIDE 0.9 % IV SOLN
INTRAVENOUS | Status: DC
  Filled 2024-03-15: qty 250

## 2024-03-15 NOTE — Progress Notes (Signed)
 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  Ultrasound of the left axilla demonstrated normal lymph nodes.  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  8 mm.  Tumor was ER/PR -0% and HER2 positive +3 by IHC.    Menarche at the age of 64.  She is G5, P5.  Age at first birth 42.  She has used birth control pills in the past.  She attained menopause in her 32s.  Family history significant for breast cancer in 2 sisters of her maternal grandmother.  Mom with cervical cancer in her 32s.  Paternal uncle with stomach cancer.  Her sister has been diagnosed with some form of skin cancer.   Left lumpectomy pathology from 11/04/2023 showed 10 mm grade 3 tumor with negative margins.  3 sentinel lymph nodes negative for malignancy.  Tumor was ER/PR negative and HER2 positive.  Patient had significant infusion reaction with first cycle of Taxol.  She  subsequently went on to do 9 cycles of weekly Abraxane and Herceptin.  She had worsening neuropathy skin rash and edema with Abraxane and was subsequently stopped.  She is currently undergoing adjuvant radiation therapy  Interval history-overall patient feels much improved today after completing a course of steroids.  She does not have any significant edema and skin rash is improved.  ECOG PS- 0 Pain scale- 0   Review of systems- Review of Systems  Constitutional:  Negative for chills, fever, malaise/fatigue and weight loss.  HENT:  Negative for congestion, ear discharge and nosebleeds.   Eyes:  Negative for blurred vision.  Respiratory:  Negative for cough, hemoptysis, sputum production, shortness of breath and wheezing.   Cardiovascular:  Negative for chest pain, palpitations, orthopnea and claudication.  Gastrointestinal:  Negative for abdominal pain, blood in stool, constipation, diarrhea, heartburn, melena, nausea and vomiting.  Genitourinary:  Negative for dysuria, flank pain, frequency, hematuria and urgency.  Musculoskeletal:  Negative for back pain, joint pain and myalgias.  Skin:  Negative for rash.  Neurological:  Negative for dizziness, tingling, focal weakness, seizures, weakness and headaches.  Endo/Heme/Allergies:  Does not bruise/bleed easily.  Psychiatric/Behavioral:  Negative for depression and suicidal ideas. The patient does not have insomnia.       Allergies  Allergen Reactions   Paclitaxel Shortness Of Breath, Other (See Comments) and Hypertension    Redness to face/chest Back Pain     Flagyl [Metronidazole] Itching     Past Medical History:  Diagnosis Date   Adenoma of appendix    Anemia  Chronic lower back pain    GERD (gastroesophageal reflux disease)    H/O gestational diabetes mellitus, not currently pregnant    History of kidney stones    Hypercholesteremia    Hyperplastic rectal polyp    Malignant neoplasm of lower-inner quadrant of left  breast, estrogen receptor negative (HCC)    Obesity    Ovarian cyst    Palpitations    Sigmoid diverticulitis 08/2012   Subclinical hypothyroidism    SVT (supraventricular tachycardia) (HCC)    on Holter monitor 5 beat run of svt   Thyroid nodule    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   Type 2 diabetes mellitus (HCC)      Past Surgical History:  Procedure Laterality Date   AXILLARY SENTINEL NODE BIOPSY Left 11/04/2023   Procedure: AXILLARY SENTINEL NODE BIOPSY;  Surgeon: Campbell Lerner, MD;  Location: [facility] ORS;  Service: General;  Laterality: Left;   BLADDER SUSPENSION  2009   BREAST BIOPSY Left 10/22/2023   [place] Core Bx, heart clip - path pending   BREAST BIOPSY Left 10/22/2023   [place] LT BREAST BX W LOC DEV 1ST LESION IMG BX SPEC US GUIDE 10/22/2023 [facility]   BREAST LUMPECTOMY WITH RADIOFREQUENCY TAG IDENTIFICATION Left 11/04/2023   Procedure: BREAST LUMPECTOMY WITH RADIOFREQUENCY TAG IDENTIFICATION;  Surgeon: Campbell Lerner, MD;  Location: [facility] ORS;  Service: General;  Laterality: Left;   Colectomy Left    likely sigmoid, open.  for diverticulitis.   COLONOSCOPY WITH PROPOFOL N/A 10/01/2022   Procedure: COLONOSCOPY WITH PROPOFOL;  Surgeon: Toney Reil, MD;  Location: [facility] ENDOSCOPY;  Service: Gastroenterology;  Laterality: N/A;   PORTACATH PLACEMENT Right 11/04/2023   Procedure: INSERTION PORT-A-CATH;  Surgeon: Campbell Lerner, MD;  Location: [facility] ORS;  Service: General;  Laterality: Right;   TUBAL LIGATION     XI ROBOTIC LAPAROSCOPIC ASSISTED APPENDECTOMY N/A 12/29/2022   Procedure: XI ROBOTIC LYSIS OF ADHESIONS AND APPENDECTOMY;  Surgeon: Campbell Lerner, MD;  Location: [facility] ORS;  Service: General;  Laterality: N/A;    Social History   Socioeconomic History   Marital status: Single    Spouse name: Not on file   Number of children: Not on file   Years of education: Not on file   Highest education level: Some college, no degree  Occupational History   Not on  file  Tobacco Use   Smoking status: Former    Current packs/day: 0.00    Types: Cigarettes, Cigars    Quit date: 09/19/2011    Years since quitting: 12.4   Smokeless tobacco: Never  Vaping Use   Vaping status: Former  Substance and Sexual Activity   Alcohol use: Not Currently    Comment: Occasional glass of wine   Drug use: No   Sexual activity: Not Currently  Other Topics Concern   Not on file  Social History Narrative   Works in Audiological scientist at [employer].   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   Social Drivers of Corporate investment banker Strain: Low Risk  (06/17/2023)   Overall Financial Resource Strain (CARDIA)    Difficulty of Paying Living Expenses: Not very hard  Food Insecurity: No Food Insecurity (10/28/2023)   Hunger Vital Sign    Worried About Running Out of Food in the Last Year: Never true    Ran Out of Food in the Last Year: Never true  Transportation Needs: No Transportation Needs (10/28/2023)   PRAPARE - Administrator, Civil Service (Medical): No  Lack of Transportation (Non-Medical): No  Physical Activity: Sufficiently Active (06/17/2023)   Exercise Vital Sign    Days of Exercise per Week: 7 days    Minutes of Exercise per Session: 60 min  Stress: Stress Concern Present (06/17/2023)   [employer] of Occupational Health - Occupational Stress Questionnaire    Feeling of Stress : To some extent  Social Connections: Moderately Integrated (06/17/2023)   Social Connection and Isolation Panel [NHANES]    Frequency of Communication with Friends and Family: More than three times a week    Frequency of Social Gatherings with Friends and Family: Once a week    Attends Religious Services: More than 4 times per year    Active Member of [employer] or Organizations: Yes    Attends Banker Meetings: More than 4 times per year    Marital Status: Divorced  Intimate Partner Violence: Not At Risk (10/28/2023)   Humiliation, Afraid,  Rape, and Kick questionnaire    Fear of Current or Ex-Partner: No    Emotionally Abused: No    Physically Abused: No    Sexually Abused: No    Family History  Problem Relation Age of Onset   Cancer - Cervical Mother        dx 59s; Passed away from Kidney Issues (Not Cancer)   Heart disease Father    Kidney disease Father    Diabetes Father    Skin cancer Sister    Stomach cancer Paternal Uncle    Diabetes Maternal Grandmother    Skin cancer Maternal Grandfather    Diabetes Paternal Grandmother    Breast cancer Other        mat great aunts x2; unk age of dx   Lung cancer Cousin    Brain cancer Cousin      Current Outpatient Medications:    acetaminophen (TYLENOL) 500 MG tablet, Take 500 mg by mouth every 6 (six) hours as needed., Disp: , Rfl:    atenolol (TENORMIN) 25 MG tablet, TAKE 1 TABLET (25 MG TOTAL) BY MOUTH DAILY. FOR PALPITATIONS. (Patient taking differently: Take 25 mg by mouth at bedtime. For palpitations.), Disp: 90 tablet, Rfl: 2   Biotin 1000 MCG tablet, Take 1,000 mcg by mouth daily., Disp: , Rfl:    blood glucose meter kit and supplies KIT, Dispense based on patient and insurance preference. Use up to four times daily as directed. (FOR ICD-9 250.00, 250.01)., Disp: 1 each, Rfl: 0   cyanocobalamin 500 MCG tablet, Take 1,000 mcg by mouth daily. Reported on 06/12/2016, Disp: , Rfl:    docusate sodium (COLACE) 100 MG capsule, Take 200 mg by mouth daily as needed for moderate constipation. Reported on 06/12/2016, Disp: , Rfl:    furosemide (LASIX) 20 MG tablet, Take 1 tablet (20 mg total) by mouth daily., Disp: 30 tablet, Rfl: 0   gabapentin (NEURONTIN) 100 MG capsule, Take 1-2 capsules (100-200 mg total) by mouth at bedtime. For neuropathy, Disp: 180 capsule, Rfl: 0   ibuprofen (ADVIL) 800 MG tablet, Take 1 tablet (800 mg total) by mouth every 8 (eight) hours as needed., Disp: 30 tablet, Rfl: 0   lidocaine-prilocaine (EMLA) cream, Apply to affected area once, Disp: 30 g,  Rfl: 3   magnesium 30 MG tablet, Take 30 mg by mouth daily as needed., Disp: , Rfl:    metFORMIN (GLUCOPHAGE-XR) 500 MG 24 hr tablet, TAKE 1 TABLET (500 MG TOTAL) BY MOUTH DAILY WITH BREAKFAST. FOR DIABETES., Disp: 90 tablet, Rfl: 1  ondansetron (ZOFRAN) 8 MG tablet, Take 1 tablet (8 mg total) by mouth every 8 (eight) hours as needed for nausea or vomiting., Disp: 30 tablet, Rfl: 1   potassium chloride SA (KLOR-CON M) 20 MEQ tablet, Take 1 tablet (20 mEq total) by mouth 2 (two) times daily., Disp: 14 tablet, Rfl: 0   predniSONE (DELTASONE) 10 MG tablet, Take 1 tablet (10 mg total) by mouth daily with breakfast., Disp: 30 tablet, Rfl: 0   prochlorperazine (COMPAZINE) 10 MG tablet, Take 1 tablet (10 mg total) by mouth every 6 (six) hours as needed for nausea or vomiting., Disp: 30 tablet, Rfl: 1   pyridOXINE (VITAMIN B6) 25 MG tablet, Take 25 mg by mouth daily., Disp: , Rfl:    rosuvastatin (CRESTOR) 5 MG tablet, TAKE 1 TABLET (5 MG TOTAL) BY MOUTH EVERY EVENING. FOR CHOLESTEROL., Disp: 90 tablet, Rfl: 2   Semaglutide,0.25 or 0.5MG /DOS, (OZEMPIC, 0.25 OR 0.5 MG/DOSE,) 2 MG/3ML SOPN, Inject 0.25 mg into the skin once weekly for 4 weeks, then increase to 0.5 mg once weekly thereafter for diabetes., Disp: 9 mL, Rfl: 1 No current facility-administered medications for this visit.  Facility-Administered Medications Ordered in Other Visits:    0.9 %  sodium chloride infusion, , Intravenous, Continuous, Creig Hines, MD, Stopped at 11/27/23 1158  Physical exam:  Vitals:   03/15/24 0907  BP: (!) 121/59  Pulse: 90  Resp: 19  Temp: (!) 96.8 F (36 C)  TempSrc: Tympanic  SpO2: 98%  Weight: 211 lb 14.4 oz (96.1 kg)  Height: 5\' 5"  (1.651 m)   Physical Exam Cardiovascular:     Rate and Rhythm: Normal rate and regular rhythm.     Heart sounds: Normal heart sounds.  Pulmonary:     Effort: Pulmonary effort is normal.     Breath sounds: Normal breath sounds.  Musculoskeletal:     Comments: Trace  bilateral edema  Skin:    General: Skin is warm and dry.  Neurological:     Mental Status: She is alert and oriented to person, place, and time.      I have personally reviewed labs listed below:    Latest Ref Rng & Units 02/23/2024    8:26 AM  CMP  Glucose 70 - 99 mg/dL 811   BUN 8 - 23 mg/dL 10   Creatinine 9.14 - 1.00 mg/dL 7.82   Sodium 956 - 213 mmol/L 135   Potassium 3.5 - 5.1 mmol/L 3.0   Chloride 98 - 111 mmol/L 103   CO2 22 - 32 mmol/L 24   Calcium 8.9 - 10.3 mg/dL 8.3   Total Protein 6.5 - 8.1 g/dL 6.1   Total Bilirubin 0.0 - 1.2 mg/dL 0.6   Alkaline Phos 38 - 126 U/L 74   AST 15 - 41 U/L 46   ALT 0 - 44 U/L 29       Latest Ref Rng & Units 02/23/2024    8:26 AM  CBC  WBC 4.0 - 10.5 K/uL 4.3   Hemoglobin 12.0 - 15.0 g/dL 9.6   Hematocrit 08.6 - 46.0 % 29.8   Platelets 150 - 400 K/uL 345    I have personally reviewed Radiology images listed below: No images are attached to the encounter.  ECHOCARDIOGRAM COMPLETE Result Date: 02/16/2024    ECHOCARDIOGRAM REPORT   Patient Name:   SUMAYYAH CUSTODIO Locher Date of Exam: 02/16/2024 Medical Rec #:  [id number]       Height:       65.0  in Accession #:    [id number]      Weight:       232.0 lb Date of Birth:  August 18, 1961       BSA:          2.107 m Patient Age:    62 years        BP:           127/70 mmHg Patient Gender: F               HR:           99 bpm. Exam Location:  [facility] Procedure: 2D Echo, Strain Analysis, Color Doppler and Cardiac Doppler (Both            Spectral and Color Flow Doppler were utilized during procedure). Indications:     Chemo Z09  History:         Patient has prior history of Echocardiogram examinations, most                  recent 11/19/2023. Risk Factors:Diabetes. Palpitations.  Sonographer:     Cristela Blue Referring Phys:  [id number] [facility] C Ruchama Kubicek Diagnosing Phys: Cristal Deer End MD  Sonographer Comments: Global longitudinal strain was attempted. IMPRESSIONS  1. Left ventricular ejection fraction, by estimation, is  60 to 65%. The left ventricle has normal function. The left ventricle has no regional wall motion abnormalities. Left ventricular diastolic parameters were normal.  2. Right ventricular systolic function is normal. The right ventricular size is normal. Tricuspid regurgitation signal is inadequate for assessing PA pressure.  3. The mitral valve is degenerative. Trivial mitral valve regurgitation. No evidence of mitral stenosis. Moderate mitral annular calcification.  4. The aortic valve was not well visualized. Aortic valve regurgitation is not visualized. No aortic stenosis is present. FINDINGS  Left Ventricle: Left ventricular ejection fraction, by estimation, is 60 to 65%. The left ventricle has normal function. The left ventricle has no regional wall motion abnormalities. Global longitudinal strain performed but not reported based on interpreter judgement due to suboptimal tracking. The left ventricular internal cavity size was normal in size. There is no left ventricular hypertrophy. Left ventricular diastolic parameters were normal. Right Ventricle: The right ventricular size is normal. No increase in right ventricular wall thickness. Right ventricular systolic function is normal. Tricuspid regurgitation signal is inadequate for assessing PA pressure. Left Atrium: Left atrial size was normal in size. Right Atrium: Right atrial size was normal in size. Pericardium: There is no evidence of pericardial effusion. Mitral Valve: The mitral valve is degenerative in appearance. There is mild thickening of the mitral valve leaflet(s). Moderate mitral annular calcification. Trivial mitral valve regurgitation. No evidence of mitral valve stenosis. MV peak gradient, 8.8 mmHg. The mean mitral valve gradient is 5.0 mmHg. Tricuspid Valve: The tricuspid valve is normal in structure. Tricuspid valve regurgitation is trivial. Aortic Valve: The aortic valve was not well visualized. Aortic valve regurgitation is not visualized. No  aortic stenosis is present. Aortic valve mean gradient measures 6.0 mmHg. Aortic valve peak gradient measures 10.4 mmHg. Aortic valve area, by VTI measures 2.92 cm. Pulmonic Valve: The pulmonic valve was not well visualized. Pulmonic valve regurgitation is not visualized. No evidence of pulmonic stenosis. Aorta: The aortic root is normal in size and structure. Pulmonary Artery: The pulmonary artery is not well seen. Venous: The inferior vena cava was not well visualized. IAS/Shunts: The interatrial septum was not well visualized.  LEFT VENTRICLE PLAX 2D LVIDd:  4.80 cm   Diastology LVIDs:         3.40 cm   LV e' medial:    9.14 cm/s LV PW:         0.87 cm   LV E/e' medial:  12.8 LV IVS:        0.87 cm   LV e' lateral:   9.68 cm/s LVOT diam:     2.00 cm   LV E/e' lateral: 12.1 LV SV:         83 LV SV Index:   39        2D Longitudinal Strain LVOT Area:     3.14 cm  2D Strain GLS Avg:     -12.9 %  RIGHT VENTRICLE RV Basal diam:  3.20 cm RV Mid diam:    2.90 cm RV S prime:     17.40 cm/s LEFT ATRIUM             Index        RIGHT ATRIUM           Index LA diam:        3.50 cm 1.66 cm/m   RA Area:     11.80 cm LA Vol (A2C):   42.0 ml 19.93 ml/m  RA Volume:   20.70 ml  9.82 ml/m LA Vol (A4C):   68.5 ml 32.51 ml/m LA Biplane Vol: 53.6 ml 25.44 ml/m  AORTIC VALVE AV Area (Vmax):    2.40 cm AV Area (Vmean):   2.40 cm AV Area (VTI):     2.92 cm AV Vmax:           161.00 cm/s AV Vmean:          116.000 cm/s AV VTI:            0.284 m AV Peak Grad:      10.4 mmHg AV Mean Grad:      6.0 mmHg LVOT Vmax:         123.00 cm/s LVOT Vmean:        88.700 cm/s LVOT VTI:          0.264 m LVOT/AV VTI ratio: 0.93  AORTA Ao Root diam: 2.70 cm MITRAL VALVE MV Area (PHT): 4.29 cm     SHUNTS MV Area VTI:   2.37 cm     Systemic VTI:  0.26 m MV Peak grad:  8.8 mmHg     Systemic Diam: 2.00 cm MV Mean grad:  5.0 mmHg MV Vmax:       1.48 m/s MV Vmean:      108.0 cm/s MV Decel Time: 177 msec MV E velocity: 117.00 cm/s MV A  velocity: 111.00 cm/s MV E/A ratio:  1.05 Cristal Deer End MD Electronically signed by Yvonne Kendall MD Signature Date/Time: 02/16/2024/5:25:59 PM    Final      Assessment and plan- Patient is a 63 y.o. 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 She is s/p 9 weekly cycles of abraxane and herceptin and here for on treatment assessment prior to cycle 4 of maintenance herceptin  Counts ok to proceed with cycle 4 of maintenance herceptin today. I will see her back in 3 weeks for cycle 5.  Echocardiogram is presently normal.  Chemo induced peripheral neuropathy: Overall stable.  She is not presently on medications and she will see how her symptoms do over the next few  weeks.  Patient starts adjuvant radiation treatment soon and is already met with them.  She has ER/PR negative disease and therefore would not be a candidate for endocrine therapy.     Visit Diagnosis 1. 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 in female, estrogen receptor negative (HCC)   2. Encounter for monoclonal antibody treatment for malignancy   3. Chemotherapy-induced peripheral neuropathy (HCC)      Dr. Owens Shark, MD, MPH [facility] [phone] 03/15/2024 9:38 AM

## 2024-03-15 NOTE — Patient Instructions (Signed)

## 2024-03-16 ENCOUNTER — Other Ambulatory Visit: Payer: Self-pay

## 2024-03-16 DIAGNOSIS — C50312 Malignant neoplasm of lower-inner quadrant of left female breast: Secondary | ICD-10-CM

## 2024-03-17 ENCOUNTER — Other Ambulatory Visit: Payer: Self-pay | Admitting: *Deleted

## 2024-03-17 ENCOUNTER — Ambulatory Visit

## 2024-03-17 DIAGNOSIS — Z171 Estrogen receptor negative status [ER-]: Secondary | ICD-10-CM

## 2024-03-17 DIAGNOSIS — Z510 Encounter for antineoplastic radiation therapy: Secondary | ICD-10-CM | POA: Diagnosis not present

## 2024-03-23 ENCOUNTER — Ambulatory Visit
Admission: RE | Admit: 2024-03-23 | Discharge: 2024-03-23 | Disposition: A | Discharge status: Home / Self Care | Source: Ambulatory Visit | Attending: Radiation Oncology | Admitting: Radiation Oncology

## 2024-03-23 DIAGNOSIS — Z510 Encounter for antineoplastic radiation therapy: Secondary | ICD-10-CM | POA: Diagnosis not present

## 2024-03-24 ENCOUNTER — Other Ambulatory Visit: Payer: Self-pay

## 2024-03-24 ENCOUNTER — Ambulatory Visit
Admission: RE | Admit: 2024-03-24 | Discharge: 2024-03-24 | Disposition: A | Discharge status: Home / Self Care | Source: Ambulatory Visit | Attending: Radiation Oncology | Admitting: Radiation Oncology

## 2024-03-24 DIAGNOSIS — Z510 Encounter for antineoplastic radiation therapy: Secondary | ICD-10-CM | POA: Diagnosis not present

## 2024-03-24 LAB — RAD ONC ARIA SESSION SUMMARY
Course Elapsed Days: 0
Plan Fractions Treated to Date: 1
Plan Prescribed Dose Per Fraction: 2.66 Gy
Plan Total Fractions Prescribed: 16
Plan Total Prescribed Dose: 42.56 Gy
Reference Point Dosage Given to Date: 2.66 Gy
Reference Point Session Dosage Given: 2.66 Gy
Session Number: 1

## 2024-03-25 ENCOUNTER — Ambulatory Visit
Admission: RE | Admit: 2024-03-25 | Discharge: 2024-03-25 | Disposition: A | Discharge status: Home / Self Care | Source: Ambulatory Visit | Attending: Radiation Oncology | Admitting: Radiation Oncology

## 2024-03-25 ENCOUNTER — Telehealth: Payer: Self-pay | Admitting: Oncology

## 2024-03-25 ENCOUNTER — Other Ambulatory Visit: Payer: Self-pay | Admitting: Oncology

## 2024-03-25 ENCOUNTER — Other Ambulatory Visit: Payer: Self-pay

## 2024-03-25 DIAGNOSIS — Z510 Encounter for antineoplastic radiation therapy: Secondary | ICD-10-CM | POA: Diagnosis not present

## 2024-03-25 LAB — RAD ONC ARIA SESSION SUMMARY
Course Elapsed Days: 1
Plan Fractions Treated to Date: 2
Plan Prescribed Dose Per Fraction: 2.66 Gy
Plan Total Fractions Prescribed: 16
Plan Total Prescribed Dose: 42.56 Gy
Reference Point Dosage Given to Date: 5.32 Gy
Reference Point Session Dosage Given: 2.66 Gy
Session Number: 2

## 2024-03-25 NOTE — Telephone Encounter (Signed)
 I changed her to fridays

## 2024-03-25 NOTE — Telephone Encounter (Signed)
 Pt stopped by desk and stated that she would like her appts to be on Fridays now as she will be off of work that day of the week. The appts are from [facility], Dr.Rao can you update the appts so they can be changed to Fridays. Thank you   Megan-Friday mornings if possible- the pt said that would work great. She also checks mychart but if you call her and let her know when they get updated that would be great! Thank you!

## 2024-03-27 ENCOUNTER — Other Ambulatory Visit: Payer: Self-pay | Admitting: Oncology

## 2024-03-28 ENCOUNTER — Encounter: Payer: Self-pay | Admitting: Internal Medicine

## 2024-03-28 ENCOUNTER — Other Ambulatory Visit: Payer: Self-pay

## 2024-03-28 ENCOUNTER — Ambulatory Visit
Admission: RE | Admit: 2024-03-28 | Discharge: 2024-03-28 | Disposition: A | Discharge status: Home / Self Care | Source: Ambulatory Visit | Attending: Radiation Oncology | Admitting: Radiation Oncology

## 2024-03-28 ENCOUNTER — Encounter: Payer: Self-pay | Admitting: Oncology

## 2024-03-28 DIAGNOSIS — Z510 Encounter for antineoplastic radiation therapy: Secondary | ICD-10-CM | POA: Diagnosis not present

## 2024-03-28 LAB — RAD ONC ARIA SESSION SUMMARY
Course Elapsed Days: 4
Plan Fractions Treated to Date: 3
Plan Prescribed Dose Per Fraction: 2.66 Gy
Plan Total Fractions Prescribed: 16
Plan Total Prescribed Dose: 42.56 Gy
Reference Point Dosage Given to Date: 7.98 Gy
Reference Point Session Dosage Given: 2.66 Gy
Session Number: 3

## 2024-03-29 ENCOUNTER — Other Ambulatory Visit: Payer: Self-pay

## 2024-03-29 ENCOUNTER — Ambulatory Visit
Admission: RE | Admit: 2024-03-29 | Discharge: 2024-03-29 | Disposition: A | Discharge status: Home / Self Care | Source: Ambulatory Visit | Attending: Radiation Oncology | Admitting: Radiation Oncology

## 2024-03-29 DIAGNOSIS — Z510 Encounter for antineoplastic radiation therapy: Secondary | ICD-10-CM | POA: Diagnosis not present

## 2024-03-29 LAB — RAD ONC ARIA SESSION SUMMARY
Course Elapsed Days: 5
Plan Fractions Treated to Date: 4
Plan Prescribed Dose Per Fraction: 2.66 Gy
Plan Total Fractions Prescribed: 16
Plan Total Prescribed Dose: 42.56 Gy
Reference Point Dosage Given to Date: 10.64 Gy
Reference Point Session Dosage Given: 2.66 Gy
Session Number: 4

## 2024-03-30 ENCOUNTER — Ambulatory Visit
Admission: RE | Admit: 2024-03-30 | Discharge: 2024-03-30 | Disposition: A | Discharge status: Home / Self Care | Source: Ambulatory Visit | Attending: Radiation Oncology | Admitting: Radiation Oncology

## 2024-03-30 ENCOUNTER — Other Ambulatory Visit: Payer: Self-pay

## 2024-03-30 DIAGNOSIS — Z510 Encounter for antineoplastic radiation therapy: Secondary | ICD-10-CM | POA: Diagnosis not present

## 2024-03-30 LAB — RAD ONC ARIA SESSION SUMMARY
Course Elapsed Days: 6
Plan Fractions Treated to Date: 5
Plan Prescribed Dose Per Fraction: 2.66 Gy
Plan Total Fractions Prescribed: 16
Plan Total Prescribed Dose: 42.56 Gy
Reference Point Dosage Given to Date: 13.3 Gy
Reference Point Session Dosage Given: 2.66 Gy
Session Number: 5

## 2024-03-31 ENCOUNTER — Ambulatory Visit
Admission: RE | Admit: 2024-03-31 | Discharge: 2024-03-31 | Disposition: A | Discharge status: Home / Self Care | Source: Ambulatory Visit | Attending: Radiation Oncology | Admitting: Radiation Oncology

## 2024-03-31 ENCOUNTER — Other Ambulatory Visit: Payer: Self-pay

## 2024-03-31 ENCOUNTER — Inpatient Hospital Stay

## 2024-03-31 DIAGNOSIS — Z171 Estrogen receptor negative status [ER-]: Secondary | ICD-10-CM

## 2024-03-31 DIAGNOSIS — Z510 Encounter for antineoplastic radiation therapy: Secondary | ICD-10-CM | POA: Diagnosis not present

## 2024-03-31 LAB — RAD ONC ARIA SESSION SUMMARY
Course Elapsed Days: 7
Plan Fractions Treated to Date: 6
Plan Prescribed Dose Per Fraction: 2.66 Gy
Plan Total Fractions Prescribed: 16
Plan Total Prescribed Dose: 42.56 Gy
Reference Point Dosage Given to Date: 15.96 Gy
Reference Point Session Dosage Given: 2.66 Gy
Session Number: 6

## 2024-03-31 LAB — CBC (CANCER CENTER ONLY)
HCT: 37.4 % (ref 36.0–46.0)
Hemoglobin: 12.2 g/dL (ref 12.0–15.0)
MCH: 29.3 pg (ref 26.0–34.0)
MCHC: 32.6 g/dL (ref 30.0–36.0)
MCV: 89.7 fL (ref 80.0–100.0)
Platelet Count: 259 10*3/uL (ref 150–400)
RBC: 4.17 MIL/uL (ref 3.87–5.11)
RDW: 14.8 % (ref 11.5–15.5)
WBC Count: 4.6 10*3/uL (ref 4.0–10.5)
nRBC: 0 % (ref 0.0–0.2)

## 2024-04-01 ENCOUNTER — Ambulatory Visit
Admission: RE | Admit: 2024-04-01 | Discharge: 2024-04-01 | Disposition: A | Discharge status: Home / Self Care | Source: Ambulatory Visit | Attending: Radiation Oncology | Admitting: Radiation Oncology

## 2024-04-01 ENCOUNTER — Other Ambulatory Visit: Payer: Self-pay

## 2024-04-01 DIAGNOSIS — Z510 Encounter for antineoplastic radiation therapy: Secondary | ICD-10-CM | POA: Diagnosis not present

## 2024-04-01 LAB — RAD ONC ARIA SESSION SUMMARY
Course Elapsed Days: 8
Plan Fractions Treated to Date: 7
Plan Prescribed Dose Per Fraction: 2.66 Gy
Plan Total Fractions Prescribed: 16
Plan Total Prescribed Dose: 42.56 Gy
Reference Point Dosage Given to Date: 18.62 Gy
Reference Point Session Dosage Given: 2.66 Gy
Session Number: 7

## 2024-04-02 ENCOUNTER — Other Ambulatory Visit: Payer: Self-pay | Admitting: Oncology

## 2024-04-02 DIAGNOSIS — C50312 Malignant neoplasm of lower-inner quadrant of left female breast: Secondary | ICD-10-CM

## 2024-04-04 ENCOUNTER — Ambulatory Visit
Admission: RE | Admit: 2024-04-04 | Discharge: 2024-04-04 | Disposition: A | Discharge status: Home / Self Care | Source: Ambulatory Visit | Attending: Radiation Oncology | Admitting: Radiation Oncology

## 2024-04-04 ENCOUNTER — Other Ambulatory Visit: Payer: Self-pay

## 2024-04-04 ENCOUNTER — Encounter: Payer: Self-pay | Admitting: Internal Medicine

## 2024-04-04 ENCOUNTER — Encounter: Payer: Self-pay | Admitting: Oncology

## 2024-04-04 DIAGNOSIS — Z510 Encounter for antineoplastic radiation therapy: Secondary | ICD-10-CM | POA: Diagnosis not present

## 2024-04-04 LAB — RAD ONC ARIA SESSION SUMMARY
Course Elapsed Days: 11
Plan Fractions Treated to Date: 8
Plan Prescribed Dose Per Fraction: 2.66 Gy
Plan Total Fractions Prescribed: 16
Plan Total Prescribed Dose: 42.56 Gy
Reference Point Dosage Given to Date: 21.28 Gy
Reference Point Session Dosage Given: 2.66 Gy
Session Number: 8

## 2024-04-05 ENCOUNTER — Ambulatory Visit

## 2024-04-05 ENCOUNTER — Other Ambulatory Visit: Payer: Self-pay

## 2024-04-05 ENCOUNTER — Other Ambulatory Visit

## 2024-04-05 ENCOUNTER — Ambulatory Visit
Admission: RE | Admit: 2024-04-05 | Discharge: 2024-04-05 | Disposition: A | Discharge status: Home / Self Care | Source: Ambulatory Visit | Attending: Radiation Oncology | Admitting: Radiation Oncology

## 2024-04-05 ENCOUNTER — Ambulatory Visit: Admitting: Oncology

## 2024-04-05 ENCOUNTER — Encounter: Payer: Self-pay | Admitting: Oncology

## 2024-04-05 DIAGNOSIS — Z510 Encounter for antineoplastic radiation therapy: Secondary | ICD-10-CM | POA: Diagnosis not present

## 2024-04-05 LAB — RAD ONC ARIA SESSION SUMMARY
Course Elapsed Days: 12
Plan Fractions Treated to Date: 9
Plan Prescribed Dose Per Fraction: 2.66 Gy
Plan Total Fractions Prescribed: 16
Plan Total Prescribed Dose: 42.56 Gy
Reference Point Dosage Given to Date: 23.94 Gy
Reference Point Session Dosage Given: 2.66 Gy
Session Number: 9

## 2024-04-06 ENCOUNTER — Ambulatory Visit
Admission: RE | Admit: 2024-04-06 | Discharge: 2024-04-06 | Disposition: A | Discharge status: Home / Self Care | Source: Ambulatory Visit | Attending: Radiation Oncology | Admitting: Radiation Oncology

## 2024-04-06 ENCOUNTER — Other Ambulatory Visit: Payer: Self-pay

## 2024-04-06 DIAGNOSIS — Z510 Encounter for antineoplastic radiation therapy: Secondary | ICD-10-CM | POA: Diagnosis not present

## 2024-04-06 LAB — RAD ONC ARIA SESSION SUMMARY
Course Elapsed Days: 13
Plan Fractions Treated to Date: 10
Plan Prescribed Dose Per Fraction: 2.66 Gy
Plan Total Fractions Prescribed: 16
Plan Total Prescribed Dose: 42.56 Gy
Reference Point Dosage Given to Date: 26.6 Gy
Reference Point Session Dosage Given: 2.66 Gy
Session Number: 10

## 2024-04-07 ENCOUNTER — Ambulatory Visit
Admission: RE | Admit: 2024-04-07 | Discharge: 2024-04-07 | Disposition: A | Discharge status: Home / Self Care | Source: Ambulatory Visit | Attending: Radiation Oncology | Admitting: Radiation Oncology

## 2024-04-07 ENCOUNTER — Other Ambulatory Visit: Payer: Self-pay

## 2024-04-07 DIAGNOSIS — Z171 Estrogen receptor negative status [ER-]: Secondary | ICD-10-CM | POA: Insufficient documentation

## 2024-04-07 DIAGNOSIS — Z510 Encounter for antineoplastic radiation therapy: Secondary | ICD-10-CM | POA: Insufficient documentation

## 2024-04-07 DIAGNOSIS — Z5112 Encounter for antineoplastic immunotherapy: Secondary | ICD-10-CM | POA: Diagnosis present

## 2024-04-07 DIAGNOSIS — C50312 Malignant neoplasm of lower-inner quadrant of left female breast: Secondary | ICD-10-CM | POA: Insufficient documentation

## 2024-04-07 DIAGNOSIS — Z1732 Human epidermal growth factor receptor 2 negative status: Secondary | ICD-10-CM | POA: Insufficient documentation

## 2024-04-07 DIAGNOSIS — Z1722 Progesterone receptor negative status: Secondary | ICD-10-CM | POA: Insufficient documentation

## 2024-04-07 DIAGNOSIS — Z1731 Human epidermal growth factor receptor 2 positive status: Secondary | ICD-10-CM | POA: Diagnosis not present

## 2024-04-07 LAB — RAD ONC ARIA SESSION SUMMARY
Course Elapsed Days: 14
Plan Fractions Treated to Date: 11
Plan Prescribed Dose Per Fraction: 2.66 Gy
Plan Total Fractions Prescribed: 16
Plan Total Prescribed Dose: 42.56 Gy
Reference Point Dosage Given to Date: 29.26 Gy
Reference Point Session Dosage Given: 2.66 Gy
Session Number: 11

## 2024-04-08 ENCOUNTER — Ambulatory Visit
Admission: RE | Admit: 2024-04-08 | Discharge: 2024-04-08 | Disposition: A | Discharge status: Home / Self Care | Source: Ambulatory Visit | Attending: Radiation Oncology | Admitting: Radiation Oncology

## 2024-04-08 ENCOUNTER — Encounter: Payer: Self-pay | Admitting: Oncology

## 2024-04-08 ENCOUNTER — Inpatient Hospital Stay (HOSPITAL_BASED_OUTPATIENT_CLINIC_OR_DEPARTMENT_OTHER): Attending: Oncology | Admitting: Oncology

## 2024-04-08 ENCOUNTER — Inpatient Hospital Stay: Attending: Oncology

## 2024-04-08 ENCOUNTER — Other Ambulatory Visit: Payer: Self-pay

## 2024-04-08 ENCOUNTER — Inpatient Hospital Stay

## 2024-04-08 VITALS — BP 115/56 | HR 80

## 2024-04-08 VITALS — BP 129/62 | HR 94 | Temp 98.0°F | Resp 16 | Wt 210.0 lb

## 2024-04-08 DIAGNOSIS — Z1731 Human epidermal growth factor receptor 2 positive status: Secondary | ICD-10-CM | POA: Insufficient documentation

## 2024-04-08 DIAGNOSIS — D6481 Anemia due to antineoplastic chemotherapy: Secondary | ICD-10-CM | POA: Diagnosis not present

## 2024-04-08 DIAGNOSIS — C50312 Malignant neoplasm of lower-inner quadrant of left female breast: Secondary | ICD-10-CM | POA: Insufficient documentation

## 2024-04-08 DIAGNOSIS — Z1732 Human epidermal growth factor receptor 2 negative status: Secondary | ICD-10-CM | POA: Insufficient documentation

## 2024-04-08 DIAGNOSIS — Z171 Estrogen receptor negative status [ER-]: Secondary | ICD-10-CM

## 2024-04-08 DIAGNOSIS — Z1722 Progesterone receptor negative status: Secondary | ICD-10-CM | POA: Insufficient documentation

## 2024-04-08 DIAGNOSIS — Z5112 Encounter for antineoplastic immunotherapy: Secondary | ICD-10-CM | POA: Insufficient documentation

## 2024-04-08 DIAGNOSIS — G620 Drug-induced polyneuropathy: Secondary | ICD-10-CM | POA: Diagnosis not present

## 2024-04-08 DIAGNOSIS — T451X5A Adverse effect of antineoplastic and immunosuppressive drugs, initial encounter: Secondary | ICD-10-CM

## 2024-04-08 DIAGNOSIS — Z510 Encounter for antineoplastic radiation therapy: Secondary | ICD-10-CM | POA: Insufficient documentation

## 2024-04-08 LAB — RAD ONC ARIA SESSION SUMMARY
Course Elapsed Days: 15
Plan Fractions Treated to Date: 12
Plan Prescribed Dose Per Fraction: 2.66 Gy
Plan Total Fractions Prescribed: 16
Plan Total Prescribed Dose: 42.56 Gy
Reference Point Dosage Given to Date: 31.92 Gy
Reference Point Session Dosage Given: 2.66 Gy
Session Number: 12

## 2024-04-08 LAB — CMP (CANCER CENTER ONLY)
ALT: 37 U/L (ref 0–44)
AST: 51 U/L — ABNORMAL HIGH (ref 15–41)
Albumin: 3.6 g/dL (ref 3.5–5.0)
Alkaline Phosphatase: 81 U/L (ref 38–126)
Anion gap: 7 (ref 5–15)
BUN: 7 mg/dL — ABNORMAL LOW (ref 8–23)
CO2: 23 mmol/L (ref 22–32)
Calcium: 8.7 mg/dL — ABNORMAL LOW (ref 8.9–10.3)
Chloride: 107 mmol/L (ref 98–111)
Creatinine: 0.78 mg/dL (ref 0.44–1.00)
GFR, Estimated: >60 mL/min (ref >60)
Glucose, Bld: 211 mg/dL — ABNORMAL HIGH (ref 70–99)
Potassium: 3.4 mmol/L — ABNORMAL LOW (ref 3.5–5.1)
Sodium: 137 mmol/L (ref 135–145)
Total Bilirubin: 0.3 mg/dL (ref 0.0–1.2)
Total Protein: 7 g/dL (ref 6.5–8.1)

## 2024-04-08 LAB — CBC WITH DIFFERENTIAL/PLATELET
Abs Immature Granulocytes: 0.01 10*3/uL (ref 0.00–0.07)
Basophils Absolute: 0 10*3/uL (ref 0.0–0.1)
Basophils Relative: 1 %
Eosinophils Absolute: 0.2 10*3/uL (ref 0.0–0.5)
Eosinophils Relative: 6 %
HCT: 39.8 % (ref 36.0–46.0)
Hemoglobin: 12.9 g/dL (ref 12.0–15.0)
Immature Granulocytes: 0 %
Lymphocytes Relative: 33 %
Lymphs Abs: 1 10*3/uL (ref 0.7–4.0)
MCH: 29.1 pg (ref 26.0–34.0)
MCHC: 32.4 g/dL (ref 30.0–36.0)
MCV: 89.8 fL (ref 80.0–100.0)
Monocytes Absolute: 0.3 10*3/uL (ref 0.1–1.0)
Monocytes Relative: 10 %
Neutro Abs: 1.5 10*3/uL — ABNORMAL LOW (ref 1.7–7.7)
Neutrophils Relative %: 50 %
Platelets: 282 10*3/uL (ref 150–400)
RBC: 4.43 MIL/uL (ref 3.87–5.11)
RDW: 14.6 % (ref 11.5–15.5)
WBC: 3.1 10*3/uL — ABNORMAL LOW (ref 4.0–10.5)
nRBC: 0 % (ref 0.0–0.2)

## 2024-04-08 MED ORDER — SODIUM CHLORIDE 0.9 % IV SOLN
INTRAVENOUS | Status: DC
Start: 2024-04-08 — End: 2024-04-08
  Filled 2024-04-08: qty 250

## 2024-04-08 MED ORDER — TRASTUZUMAB-ANNS CHEMO 150 MG IV SOLR
6.0000 mg/kg | Freq: Once | INTRAVENOUS | Status: AC
  Administered 2024-04-08: 600 mg via INTRAVENOUS
  Filled 2024-04-08: qty 28.57

## 2024-04-08 MED ORDER — ACETAMINOPHEN 325 MG PO TABS
650.0000 mg | ORAL_TABLET | Freq: Once | ORAL | Status: AC
  Administered 2024-04-08: 650 mg via ORAL
  Filled 2024-04-08: qty 2

## 2024-04-08 MED ORDER — DIPHENHYDRAMINE HCL 25 MG PO CAPS
25.0000 mg | ORAL_CAPSULE | Freq: Once | ORAL | Status: AC
  Administered 2024-04-08: 25 mg via ORAL
  Filled 2024-04-08: qty 1

## 2024-04-08 MED ORDER — HEPARIN SOD (PORK) LOCK FLUSH 100 UNIT/ML IV SOLN
500.0000 [IU] | Freq: Once | INTRAVENOUS | Status: AC | PRN
  Administered 2024-04-08: 500 [IU]
  Filled 2024-04-08: qty 5

## 2024-04-08 NOTE — Progress Notes (Signed)
 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  Ultrasound of the left axilla demonstrated normal lymph nodes.  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  8 mm.  Tumor was ER/PR -0% and HER2 positive +3 by IHC.    Menarche at the age of 52.  She is G5, P5.  Age at first birth 42.  She has used birth control pills in the past.  She attained menopause in her 64s.  Family history significant for breast cancer in 2 sisters of her maternal grandmother.  Mom with cervical cancer in her 43s.  Paternal uncle with stomach cancer.  Her sister has been diagnosed with some form of skin cancer.   Left lumpectomy pathology from 11/04/2023 showed 10 mm grade 3 tumor with negative margins.  3 sentinel lymph nodes negative for malignancy.  Tumor was ER/PR negative and HER2 positive.  Patient had significant infusion reaction with first cycle of Taxol .  She  subsequently went on to do 9 cycles of weekly Abraxane  and Herceptin .  She had worsening neuropathy skin rash and edema with Abraxane  and was subsequently stopped.  She is currently undergoing adjuvant radiation therapy and receiving maintenance Herceptin     Interval history-tolerating Herceptin  well so far.  Leg edema has resolved.  Skin rash has also resolved.  Neuropathy is mildly improved.  She did take 2 doses of gabapentin  but felt jittery after taking it and has not been using it since then.  ECOG PS- 0 Pain scale- 0 Opioid associated constipation- no  Review of systems- Review of Systems  Constitutional:  Negative for chills, fever, malaise/fatigue and weight loss.  HENT:  Negative for congestion, ear discharge and nosebleeds.   Eyes:  Negative for blurred vision.  Respiratory:  Negative for cough, hemoptysis, sputum production, shortness of breath and wheezing.   Cardiovascular:  Negative for chest pain, palpitations, orthopnea and claudication.  Gastrointestinal:  Negative for abdominal pain, blood in stool, constipation, diarrhea, heartburn, melena, nausea and vomiting.  Genitourinary:  Negative for dysuria, flank pain, frequency, hematuria and urgency.  Musculoskeletal:  Negative for back pain, joint pain and myalgias.  Skin:  Negative for rash.  Neurological:  Positive for sensory change (Peripheral neuropathy). Negative for dizziness, tingling, focal weakness, seizures, weakness and headaches.  Endo/Heme/Allergies:  Does not bruise/bleed easily.  Psychiatric/Behavioral:  Negative for depression and suicidal ideas. The patient does not have insomnia.       Allergies  Allergen Reactions   Paclitaxel  Shortness Of Breath, Other (See Comments) and Hypertension  Redness to face/chest Back Pain     Flagyl [Metronidazole] Itching     Past Medical History:  Diagnosis Date   Adenoma of appendix    Anemia    Chronic lower back pain    GERD (gastroesophageal reflux  disease)    H/O gestational diabetes mellitus, not currently pregnant    History of kidney stones    Hypercholesteremia    Hyperplastic rectal polyp    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, estrogen receptor negative (HCC)    Obesity    Ovarian cyst    Palpitations    Sigmoid diverticulitis 08/2012   Subclinical hypothyroidism    SVT (supraventricular tachycardia) (HCC)    on Holter monitor 5 beat run of svt   Thyroid  nodule    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   Type 2 diabetes mellitus (HCC)      Past Surgical History:  Procedure Laterality Date   AXILLARY SENTINEL NODE BIOPSY Left 11/04/2023   Procedure: AXILLARY SENTINEL NODE BIOPSY;  Surgeon: Flynn Hylan, MD;  Location: [facility] ORS;  Service: General;  Laterality: Left;   BLADDER SUSPENSION  2009   BREAST BIOPSY Left 10/22/2023   Us  Core Bx, heart clip - path pending   BREAST BIOPSY Left 10/22/2023   US  LT BREAST BX W LOC DEV 1ST LESION IMG BX SPEC US  GUIDE 10/22/2023 [facility]   BREAST LUMPECTOMY WITH RADIOFREQUENCY TAG IDENTIFICATION Left 11/04/2023   Procedure: BREAST LUMPECTOMY WITH RADIOFREQUENCY TAG IDENTIFICATION;  Surgeon: Flynn Hylan, MD;  Location: [facility] ORS;  Service: General;  Laterality: Left;   Colectomy Left    likely sigmoid, open.  for diverticulitis.   COLONOSCOPY WITH PROPOFOL  N/A 10/01/2022   Procedure: COLONOSCOPY WITH PROPOFOL ;  Surgeon: Selena Daily, MD;  Location[facility];  Service: Gastroenterology;  Laterality: N/A;   PORTACATH PLACEMENT Right 11/04/2023   Procedure: INSERTION PORT-A-CATH;  Surgeon: Flynn Hylan, MD;  Location: [facility] ORS;  Service: General;  Laterality: Right;   TUBAL LIGATION     XI ROBOTIC LAPAROSCOPIC ASSISTED APPENDECTOMY N/A 12/29/2022   Procedure: XI ROBOTIC LYSIS OF ADHESIONS AND APPENDECTOMY;  Surgeon: Flynn Hylan, MD;  Location: [facility] OR[facility];  Service: General;  Laterality: N/A;    Social History   Socioeconomic History    Marital status: Single    Spouse name: Not on file   Number of children: Not on file   Years of education: Not on file   Highest education level: Some college, no degree  Occupational History   Not on file  Tobacco Use   Smoking status: Former    Current packs/day: 0.00    Types: Cigarettes, Cigars    Quit date: 09/19/2011    Years since quitting: 12.5   Smokeless tobacco: Never  Vaping Use   Vaping status: Former  Substance and Sexual Activity   Alcohol use: Not Currently    Comment: Occasional glass of wine   Drug use: No   Sexual activity: Not Currently  Other Topics Concern   Not on file  Social History Narrative   Works in Audiological scientist at [employer].   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   Social Drivers of Corporate investment banker Strain: Low Risk  (06/17/2023)   Overall Financial Resource Strain (CARDIA)    Difficulty of Paying Living Expenses: Not very hard  Food Insecurity: No Food Insecurity (10/28/2023)   Hunger Vital Sign    Worried About Running Out of Food in the Last Year: Never true  Ran Out of Food in the Last Year: Never true  Transportation Needs: No Transportation Needs (10/28/2023)   PRAPARE - Administrator, Civil Service (Medical): No    Lack of Transportation (Non-Medical): No  Physical Activity: Sufficiently Active (06/17/2023)   Exercise Vital Sign    Days of Exercise per Week: 7 days    Minutes of Exercise per Session: 60 min  Stress: Stress Concern Present (06/17/2023)   [employer] of Occupational Health - Occupational Stress Questionnaire    Feeling of Stress : To some extent  Social Connections: Moderately Integrated (06/17/2023)   Social Connection and Isolation Panel [NHANES]    Frequency of Communication with Friends and Family: More than three times a week    Frequency of Social Gatherings with Friends and Family: Once a week    Attends Religious Services: More than 4 times per year    Active Member  of [employer] or Organizations: Yes    Attends Banker Meetings: More than 4 times per year    Marital Status: Divorced  Intimate Partner Violence: Not At Risk (10/28/2023)   Humiliation, Afraid, Rape, and Kick questionnaire    Fear of Current or Ex-Partner: No    Emotionally Abused: No    Physically Abused: No    Sexually Abused: No    Family History  Problem Relation Age of Onset   Cancer - Cervical Mother        dx 46s; Passed away from Kidney Issues (Not Cancer)   Heart disease Father    Kidney disease Father    Diabetes Father    Skin cancer Sister    Stomach cancer Paternal Uncle    Diabetes Maternal Grandmother    Skin cancer Maternal Grandfather    Diabetes Paternal Grandmother    Breast cancer Other        mat great aunts x2; unk age of dx   Lung cancer Cousin    Brain cancer Cousin      Current Outpatient Medications:    acetaminophen  (TYLENOL ) 500 MG tablet, Take 500 mg by mouth every 6 (six) hours as needed., Disp: , Rfl:    atenolol  (TENORMIN ) 25 MG tablet, TAKE 1 TABLET (25 MG TOTAL) BY MOUTH DAILY. FOR PALPITATIONS. (Patient taking differently: Take 25 mg by mouth at bedtime. For palpitations.), Disp: 90 tablet, Rfl: 2   Biotin 1000 MCG tablet, Take 1,000 mcg by mouth daily., Disp: , Rfl:    blood glucose meter kit and supplies KIT, Dispense based on patient and insurance preference. Use up to four times daily as directed. (FOR ICD-9 250.00, 250.01)., Disp: 1 each, Rfl: 0   cyanocobalamin 500 MCG tablet, Take 1,000 mcg by mouth daily. Reported on 06/12/2016, Disp: , Rfl:    docusate sodium (COLACE) 100 MG capsule, Take 200 mg by mouth daily as needed for moderate constipation. Reported on 06/12/2016, Disp: , Rfl:    furosemide  (LASIX ) 20 MG tablet, Take 1 tablet (20 mg total) by mouth daily., Disp: 30 tablet, Rfl: 0   gabapentin  (NEURONTIN ) 100 MG capsule, Take 1-2 capsules (100-200 mg total) by mouth at bedtime. For neuropathy, Disp: 180 capsule, Rfl: 0    ibuprofen  (ADVIL ) 800 MG tablet, Take 1 tablet (800 mg total) by mouth every 8 (eight) hours as needed., Disp: 30 tablet, Rfl: 0   lidocaine -prilocaine  (EMLA ) cream, APPLY TO AFFECTED AREA ONCE AS DIRECTED, Disp: 30 g, Rfl: 3   magnesium 30 MG tablet, Take 30 mg by mouth daily  as needed., Disp: , Rfl:    metFORMIN  (GLUCOPHAGE -XR) 500 MG 24 hr tablet, TAKE 1 TABLET (500 MG TOTAL) BY MOUTH DAILY WITH BREAKFAST. FOR DIABETES., Disp: 90 tablet, Rfl: 1   ondansetron  (ZOFRAN ) 8 MG tablet, Take 1 tablet (8 mg total) by mouth every 8 (eight) hours as needed for nausea or vomiting., Disp: 30 tablet, Rfl: 1   potassium chloride  SA (KLOR-CON  M) 20 MEQ tablet, Take 1 tablet (20 mEq total) by mouth 2 (two) times daily., Disp: 14 tablet, Rfl: 0   predniSONE  (DELTASONE ) 10 MG tablet, Take 1 tablet (10 mg total) by mouth daily with breakfast., Disp: 30 tablet, Rfl: 0   prochlorperazine  (COMPAZINE ) 10 MG tablet, Take 1 tablet (10 mg total) by mouth every 6 (six) hours as needed for nausea or vomiting., Disp: 30 tablet, Rfl: 1   pyridOXINE (VITAMIN B6) 25 MG tablet, Take 25 mg by mouth daily., Disp: , Rfl:    rosuvastatin  (CRESTOR ) 5 MG tablet, TAKE 1 TABLET (5 MG TOTAL) BY MOUTH EVERY EVENING. FOR CHOLESTEROL., Disp: 90 tablet, Rfl: 2   Semaglutide ,0.25 or 0.5MG /DOS, (OZEMPIC , 0.25 OR 0.5 MG/DOSE,) 2 MG/3ML SOPN, Inject 0.25 mg into the skin once weekly for 4 weeks, then increase to 0.5 mg once weekly thereafter for diabetes., Disp: 9 mL, Rfl: 1 No current facility-administered medications for this visit.  Facility-Administered Medications Ordered in Other Visits:    0.9 %  sodium chloride  infusion, , Intravenous, Continuous, Avonne Boettcher, MD, Stopped at 11/27/23 1158  Physical exam:  Vitals:   04/08/24 0855  BP: 129/62  Pulse: 94  Resp: 16  Temp: 98 F (36.7 C)  TempSrc: Tympanic  SpO2: 98%  Weight: 210 lb (95.3 kg)   Physical Exam Cardiovascular:     Rate and Rhythm: Normal rate and regular rhythm.      Heart sounds: Normal heart sounds.  Pulmonary:     Effort: Pulmonary effort is normal.     Breath sounds: Normal breath sounds.  Musculoskeletal:     Right lower leg: No edema.     Left lower leg: No edema.  Skin:    General: Skin is warm and dry.  Neurological:     Mental Status: She is alert and oriented to person, place, and time.      I have personally reviewed labs listed below:    Latest Ref Rng & Units 04/08/2024    8:42 AM  CMP  Glucose 70 - 99 mg/dL 409   BUN 8 - 23 mg/dL 7   Creatinine 8.11 - 9.14 mg/dL 7.82   Sodium 956 - 213 mmol/L 137   Potassium 3.5 - 5.1 mmol/L 3.4   Chloride 98 - 111 mmol/L 107   CO2 22 - 32 mmol/L 23   Calcium  8.9 - 10.3 mg/dL 8.7   Total Protein 6.5 - 8.1 g/dL 7.0   Total Bilirubin 0.0 - 1.2 mg/dL 0.3   Alkaline Phos 38 - 126 U/L 81   AST 15 - 41 U/L 51   ALT 0 - 44 U/L 37       Latest Ref Rng & Units 04/08/2024    8:43 AM  CBC  WBC 4.0 - 10.5 K/uL 3.1   Hemoglobin 12.0 - 15.0 g/dL 08.6   Hematocrit 57.8 - 46.0 % 39.8   Platelets 150 - 400 K/uL 282      Assessment and plan- Patient is a 63 y.o. female  with history of pathological prognostic stage Ia invasive mammary carcinoma of  the left breast pT1b N0 M0 grade 3 ER/PR negative and HER2 positive.  She is s/p 9 weekly cycles of Abraxane  and Herceptin  which was subsequently stopped due to intolerance.  She is here for on treatment assessment prior to cycle 5 of maintenance Herceptin   Counts okay to proceed with cycle 5 of Herceptin  today.  I will see her back in 3 weeks for cycle 6.  She is undergoing adjuvant radiation therapy presently.  Chemo induced peripheral neuropathy: Overall mild continue to monitor.  She is on gabapentin  at this time  Mild leukopenia/neutropenia: Continue to monitor.  Herceptin  is unlikely to cause neutropenia  Chemo induced anemia: Improved after stopping Abraxane    Visit Diagnosis 1. Encounter for monoclonal antibody treatment for malignancy   2.  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 in female, estrogen receptor negative (HCC)   3. Chemotherapy-induced peripheral neuropathy (HCC)   4. Antineoplastic chemotherapy induced anemia      Dr. Seretha Dance, MD, MPH [facility] [phone] 04/08/2024 9:14 AM

## 2024-04-08 NOTE — Patient Instructions (Signed)
 [facility] - A DEPT OF [facility]  Discharge Instructions: Thank you for choosing [facility] to provide your oncology and hematology care.  If you have a lab appointment with the Cancer Center, please go directly to the Cancer Center and check in at the registration area.  Wear comfortable clothing and clothing appropriate for easy access to any Portacath or PICC line.   We strive to give you quality time with your provider. You may need to reschedule your appointment if you arrive late (15 or more minutes).  Arriving late affects you and other patients whose appointments are after yours.  Also, if you miss three or more appointments without notifying the office, you may be dismissed from the clinic at the provider's discretion.      For prescription refill requests, have your pharmacy contact our office and allow 72 hours for refills to be completed.    Today you received the following chemotherapy and/or immunotherapy agents Kanjinti      To help prevent nausea and vomiting after your treatment, we encourage you to take your nausea medication as directed.  BELOW ARE SYMPTOMS THAT SHOULD BE REPORTED IMMEDIATELY: *FEVER GREATER THAN 100.4 F (38 C) OR HIGHER *CHILLS OR SWEATING *NAUSEA AND VOMITING THAT IS NOT CONTROLLED WITH YOUR NAUSEA MEDICATION *UNUSUAL SHORTNESS OF BREATH *UNUSUAL BRUISING OR BLEEDING *URINARY PROBLEMS (pain or burning when urinating, or frequent urination) *BOWEL PROBLEMS (unusual diarrhea, constipation, pain near the anus) TENDERNESS IN MOUTH AND THROAT WITH OR WITHOUT PRESENCE OF ULCERS (sore throat, sores in mouth, or a toothache) UNUSUAL RASH, SWELLING OR PAIN  UNUSUAL VAGINAL DISCHARGE OR ITCHING   Items with * indicate a potential emergency and should be followed up as soon as possible or go to the Emergency Department if any problems should occur.  Please show the CHEMOTHERAPY ALERT CARD or IMMUNOTHERAPY  ALERT CARD at check-in to the Emergency Department and triage nurse.  Should you have questions after your visit or need to cancel or reschedule your appointment, please contact [facility] - A DEPT OF [facility]  [phone] and follow the prompts.  Office hours are 8:00 a.m. to 4:30 p.m. Monday - Friday. Please note that voicemails left after 4:00 p.m. may not be returned until the following business day.  We are closed weekends and major holidays. You have access to a nurse at all times for urgent questions. Please call the main number to the clinic [phone] and follow the prompts.  For any non-urgent questions, you may also contact your provider using MyChart. We now offer e-Visits for anyone 3 and older to request care online for non-urgent symptoms. For details visit [email or web address].   Also download the MyChart app! Go to the app store, search "MyChart", open the app, select [place], and log in with your MyChart username and password.

## 2024-04-09 DIAGNOSIS — Z5112 Encounter for antineoplastic immunotherapy: Secondary | ICD-10-CM | POA: Diagnosis not present

## 2024-04-11 ENCOUNTER — Other Ambulatory Visit: Payer: Self-pay

## 2024-04-11 ENCOUNTER — Ambulatory Visit
Admission: RE | Admit: 2024-04-11 | Discharge: 2024-04-11 | Disposition: A | Discharge status: Home / Self Care | Source: Ambulatory Visit | Attending: Radiation Oncology | Admitting: Radiation Oncology

## 2024-04-11 DIAGNOSIS — Z5112 Encounter for antineoplastic immunotherapy: Secondary | ICD-10-CM | POA: Diagnosis not present

## 2024-04-11 LAB — RAD ONC ARIA SESSION SUMMARY
Course Elapsed Days: 18
Plan Fractions Treated to Date: 13
Plan Prescribed Dose Per Fraction: 2.66 Gy
Plan Total Fractions Prescribed: 16
Plan Total Prescribed Dose: 42.56 Gy
Reference Point Dosage Given to Date: 34.58 Gy
Reference Point Session Dosage Given: 2.117 Gy
Session Number: 13

## 2024-04-12 ENCOUNTER — Other Ambulatory Visit: Payer: Self-pay

## 2024-04-12 ENCOUNTER — Ambulatory Visit
Admission: RE | Admit: 2024-04-12 | Discharge: 2024-04-12 | Disposition: A | Discharge status: Home / Self Care | Source: Ambulatory Visit | Attending: Radiation Oncology | Admitting: Radiation Oncology

## 2024-04-12 DIAGNOSIS — Z5112 Encounter for antineoplastic immunotherapy: Secondary | ICD-10-CM | POA: Diagnosis not present

## 2024-04-12 LAB — RAD ONC ARIA SESSION SUMMARY
Course Elapsed Days: 19
Plan Fractions Treated to Date: 14
Plan Prescribed Dose Per Fraction: 2.66 Gy
Plan Total Fractions Prescribed: 16
Plan Total Prescribed Dose: 42.56 Gy
Reference Point Dosage Given to Date: 37.24 Gy
Reference Point Session Dosage Given: 2.66 Gy
Session Number: 14

## 2024-04-13 ENCOUNTER — Other Ambulatory Visit: Payer: Self-pay

## 2024-04-13 ENCOUNTER — Ambulatory Visit
Admission: RE | Admit: 2024-04-13 | Discharge: 2024-04-13 | Disposition: A | Discharge status: Home / Self Care | Source: Ambulatory Visit | Attending: Radiation Oncology | Admitting: Radiation Oncology

## 2024-04-13 DIAGNOSIS — Z5112 Encounter for antineoplastic immunotherapy: Secondary | ICD-10-CM | POA: Diagnosis not present

## 2024-04-13 LAB — RAD ONC ARIA SESSION SUMMARY
Course Elapsed Days: 20
Plan Fractions Treated to Date: 15
Plan Prescribed Dose Per Fraction: 2.66 Gy
Plan Total Fractions Prescribed: 16
Plan Total Prescribed Dose: 42.56 Gy
Reference Point Dosage Given to Date: 39.9 Gy
Reference Point Session Dosage Given: 2.66 Gy
Session Number: 15

## 2024-04-14 ENCOUNTER — Other Ambulatory Visit: Payer: Self-pay

## 2024-04-14 ENCOUNTER — Inpatient Hospital Stay

## 2024-04-14 ENCOUNTER — Ambulatory Visit
Admission: RE | Admit: 2024-04-14 | Discharge: 2024-04-14 | Disposition: A | Discharge status: Home / Self Care | Source: Ambulatory Visit | Attending: Radiation Oncology | Admitting: Radiation Oncology

## 2024-04-14 DIAGNOSIS — Z5112 Encounter for antineoplastic immunotherapy: Secondary | ICD-10-CM | POA: Diagnosis not present

## 2024-04-14 LAB — RAD ONC ARIA SESSION SUMMARY
Course Elapsed Days: 21
Plan Fractions Treated to Date: 16
Plan Prescribed Dose Per Fraction: 2.66 Gy
Plan Total Fractions Prescribed: 16
Plan Total Prescribed Dose: 42.56 Gy
Reference Point Dosage Given to Date: 42.56 Gy
Reference Point Session Dosage Given: 2.66 Gy
Session Number: 16

## 2024-04-15 ENCOUNTER — Ambulatory Visit
Admission: RE | Admit: 2024-04-15 | Discharge: 2024-04-15 | Disposition: A | Discharge status: Home / Self Care | Source: Ambulatory Visit | Attending: Radiation Oncology | Admitting: Radiation Oncology

## 2024-04-15 ENCOUNTER — Other Ambulatory Visit: Payer: Self-pay

## 2024-04-15 DIAGNOSIS — Z5112 Encounter for antineoplastic immunotherapy: Secondary | ICD-10-CM | POA: Diagnosis not present

## 2024-04-15 LAB — RAD ONC ARIA SESSION SUMMARY
Course Elapsed Days: 22
Plan Fractions Treated to Date: 1
Plan Prescribed Dose Per Fraction: 2 Gy
Plan Total Fractions Prescribed: 8
Plan Total Prescribed Dose: 16 Gy
Reference Point Dosage Given to Date: 2 Gy
Reference Point Session Dosage Given: 2 Gy
Session Number: 17

## 2024-04-18 ENCOUNTER — Ambulatory Visit
Admission: RE | Admit: 2024-04-18 | Discharge: 2024-04-18 | Disposition: A | Discharge status: Home / Self Care | Source: Ambulatory Visit | Attending: Radiation Oncology | Admitting: Radiation Oncology

## 2024-04-18 ENCOUNTER — Other Ambulatory Visit: Payer: Self-pay

## 2024-04-18 DIAGNOSIS — Z5112 Encounter for antineoplastic immunotherapy: Secondary | ICD-10-CM | POA: Diagnosis not present

## 2024-04-18 LAB — RAD ONC ARIA SESSION SUMMARY
Course Elapsed Days: 25
Plan Fractions Treated to Date: 2
Plan Prescribed Dose Per Fraction: 2 Gy
Plan Total Fractions Prescribed: 8
Plan Total Prescribed Dose: 16 Gy
Reference Point Dosage Given to Date: 4 Gy
Reference Point Session Dosage Given: 2 Gy
Session Number: 18

## 2024-04-19 ENCOUNTER — Ambulatory Visit
Admission: RE | Admit: 2024-04-19 | Discharge: 2024-04-19 | Disposition: A | Discharge status: Home / Self Care | Source: Ambulatory Visit | Attending: Radiation Oncology | Admitting: Radiation Oncology

## 2024-04-19 ENCOUNTER — Other Ambulatory Visit: Payer: Self-pay

## 2024-04-19 DIAGNOSIS — Z5112 Encounter for antineoplastic immunotherapy: Secondary | ICD-10-CM | POA: Diagnosis not present

## 2024-04-19 LAB — RAD ONC ARIA SESSION SUMMARY
Course Elapsed Days: 26
Plan Fractions Treated to Date: 3
Plan Prescribed Dose Per Fraction: 2 Gy
Plan Total Fractions Prescribed: 8
Plan Total Prescribed Dose: 16 Gy
Reference Point Dosage Given to Date: 6 Gy
Reference Point Session Dosage Given: 2 Gy
Session Number: 19

## 2024-04-20 ENCOUNTER — Ambulatory Visit
Admission: RE | Admit: 2024-04-20 | Discharge: 2024-04-20 | Disposition: A | Discharge status: Home / Self Care | Source: Ambulatory Visit | Attending: Radiation Oncology | Admitting: Radiation Oncology

## 2024-04-20 ENCOUNTER — Other Ambulatory Visit: Payer: Self-pay

## 2024-04-20 DIAGNOSIS — Z5112 Encounter for antineoplastic immunotherapy: Secondary | ICD-10-CM | POA: Diagnosis not present

## 2024-04-20 LAB — RAD ONC ARIA SESSION SUMMARY
Course Elapsed Days: 27
Plan Fractions Treated to Date: 4
Plan Prescribed Dose Per Fraction: 2 Gy
Plan Total Fractions Prescribed: 8
Plan Total Prescribed Dose: 16 Gy
Reference Point Dosage Given to Date: 8 Gy
Reference Point Session Dosage Given: 2 Gy
Session Number: 20

## 2024-04-21 ENCOUNTER — Ambulatory Visit
Admission: RE | Admit: 2024-04-21 | Discharge: 2024-04-21 | Disposition: A | Discharge status: Home / Self Care | Source: Ambulatory Visit | Attending: Radiation Oncology | Admitting: Radiation Oncology

## 2024-04-21 ENCOUNTER — Inpatient Hospital Stay

## 2024-04-21 ENCOUNTER — Other Ambulatory Visit: Payer: Self-pay

## 2024-04-21 ENCOUNTER — Other Ambulatory Visit: Payer: Self-pay | Admitting: *Deleted

## 2024-04-21 DIAGNOSIS — Z5112 Encounter for antineoplastic immunotherapy: Secondary | ICD-10-CM | POA: Diagnosis not present

## 2024-04-21 DIAGNOSIS — C50312 Malignant neoplasm of lower-inner quadrant of left female breast: Secondary | ICD-10-CM

## 2024-04-21 LAB — CBC (CANCER CENTER ONLY)
HCT: 39.2 % (ref 36.0–46.0)
Hemoglobin: 12.9 g/dL (ref 12.0–15.0)
MCH: 29.1 pg (ref 26.0–34.0)
MCHC: 32.9 g/dL (ref 30.0–36.0)
MCV: 88.3 fL (ref 80.0–100.0)
Platelet Count: 261 10*3/uL (ref 150–400)
RBC: 4.44 MIL/uL (ref 3.87–5.11)
RDW: 14.3 % (ref 11.5–15.5)
WBC Count: 4 10*3/uL (ref 4.0–10.5)
nRBC: 0 % (ref 0.0–0.2)

## 2024-04-21 LAB — RAD ONC ARIA SESSION SUMMARY
Course Elapsed Days: 28
Plan Fractions Treated to Date: 5
Plan Prescribed Dose Per Fraction: 2 Gy
Plan Total Fractions Prescribed: 8
Plan Total Prescribed Dose: 16 Gy
Reference Point Dosage Given to Date: 10 Gy
Reference Point Session Dosage Given: 2 Gy
Session Number: 21

## 2024-04-21 MED ORDER — SILVER SULFADIAZINE 1 % EX CREA: 1.0000 | TOPICAL_CREAM | Freq: Two times a day (BID) | CUTANEOUS | 0 refills | Status: AC

## 2024-04-22 ENCOUNTER — Ambulatory Visit
Admission: RE | Admit: 2024-04-22 | Discharge: 2024-04-22 | Disposition: A | Discharge status: Home / Self Care | Source: Ambulatory Visit | Attending: Radiation Oncology | Admitting: Radiation Oncology

## 2024-04-22 ENCOUNTER — Other Ambulatory Visit: Payer: Self-pay

## 2024-04-22 DIAGNOSIS — Z5112 Encounter for antineoplastic immunotherapy: Secondary | ICD-10-CM | POA: Diagnosis not present

## 2024-04-22 LAB — RAD ONC ARIA SESSION SUMMARY
Course Elapsed Days: 29
Plan Fractions Treated to Date: 6
Plan Prescribed Dose Per Fraction: 2 Gy
Plan Total Fractions Prescribed: 8
Plan Total Prescribed Dose: 16 Gy
Reference Point Dosage Given to Date: 12 Gy
Reference Point Session Dosage Given: 2 Gy
Session Number: 22

## 2024-04-25 ENCOUNTER — Ambulatory Visit
Admission: RE | Admit: 2024-04-25 | Discharge: 2024-04-25 | Disposition: A | Discharge status: Home / Self Care | Source: Ambulatory Visit | Attending: Radiation Oncology | Admitting: Radiation Oncology

## 2024-04-25 ENCOUNTER — Other Ambulatory Visit: Payer: Self-pay

## 2024-04-25 DIAGNOSIS — Z5112 Encounter for antineoplastic immunotherapy: Secondary | ICD-10-CM | POA: Diagnosis not present

## 2024-04-25 LAB — RAD ONC ARIA SESSION SUMMARY
Course Elapsed Days: 32
Plan Fractions Treated to Date: 7
Plan Prescribed Dose Per Fraction: 2 Gy
Plan Total Fractions Prescribed: 8
Plan Total Prescribed Dose: 16 Gy
Reference Point Dosage Given to Date: 14 Gy
Reference Point Session Dosage Given: 2 Gy
Session Number: 23

## 2024-04-26 ENCOUNTER — Encounter: Payer: Self-pay | Admitting: *Deleted

## 2024-04-26 ENCOUNTER — Other Ambulatory Visit: Payer: Self-pay

## 2024-04-26 ENCOUNTER — Ambulatory Visit
Admission: RE | Admit: 2024-04-26 | Discharge: 2024-04-26 | Disposition: A | Discharge status: Home / Self Care | Source: Ambulatory Visit | Attending: Radiation Oncology | Admitting: Radiation Oncology

## 2024-04-26 DIAGNOSIS — Z5112 Encounter for antineoplastic immunotherapy: Secondary | ICD-10-CM | POA: Diagnosis not present

## 2024-04-26 LAB — RAD ONC ARIA SESSION SUMMARY
Course Elapsed Days: 33
Plan Fractions Treated to Date: 8
Plan Prescribed Dose Per Fraction: 2 Gy
Plan Total Fractions Prescribed: 8
Plan Total Prescribed Dose: 16 Gy
Reference Point Dosage Given to Date: 16 Gy
Reference Point Session Dosage Given: 2 Gy
Session Number: 24

## 2024-04-27 ENCOUNTER — Encounter: Payer: Self-pay | Admitting: *Deleted

## 2024-04-27 NOTE — Radiation Completion Notes (Signed)
 Patient Name: Rebecca Macias, Rebecca Macias MRN: [id number] Date of Birth: Dec 09, 1960 Referring Physician: Seretha Dance, M.D. Date of Service: 2024-04-27 Radiation Oncologist: Glenis Langdon, M.D. [facility] [place] [facility]                             RADIATION ONCOLOGY END OF TREATMENT NOTE     Diagnosis: C50.312 Malignant neoplasm of lower-inner quadrant of left female breast Staging on 2023-11-20: 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 in female, estrogen receptor negative (HCC) T=pT1b, N=pN0, M=cM0 Staging on 2023-10-28: 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 in female, estrogen receptor negative (HCC) T=cT1b, N=cN0, M=cM0 Intent: Curative     HPI: Patient is a 63 year old female who presented with an abnormal mammogram of the left breast.  There was asymmetry in the right breast at the 9 o'clock position which on ultrasound was hypoechoic measuring 0.6 x 0.5 x 0.4 cm.  Ultrasound-guided biopsy was positive for invasive mammary carcinoma ER/PR negative HER2/neu overexpressed.  Axillary ultrasound was negative.  She underwent wide local excision for invasive mammary carcinoma no special type measuring 1 cm overall grade 3.  Margins were close at 0.4 mm for the invasive component there was DCIS present with a 0.1 mm margin.  3 sentinel lymph nodes were examined all negative for metastatic disease.  Again tumor was ER/PR negative HER2/neu overexpressed this prompted systemic chemotherapy.  She was treated with Abraxane  and Herceptin  and received 9 out of 10 cycles.  She developed peripheral neuropathy and Abraxane  was held.  She is currently on Herceptin  treatment.  She is doing well specifically denies any breast tenderness cough or bone pain.  She did have chemotherapy induced neutropenia.      ==========DELIVERED PLANS==========  First Treatment Date: 2024-03-24 Last Treatment Date: 2024-04-26   Plan Name: Breast_L_BH Site: Breast, Left Technique:  3D Mode: Photon Dose Per Fraction: 2.66 Gy Prescribed Dose (Delivered / Prescribed): 42.56 Gy / 42.56 Gy Prescribed Fxs (Delivered / Prescribed): 16 / 16   Plan Name: Breast_Lt_Bst Site: Breast, Left Technique: 3D Mode: Photon Dose Per Fraction: 2 Gy Prescribed Dose (Delivered / Prescribed): 16 Gy / 16 Gy Prescribed Fxs (Delivered / Prescribed): 8 / 8     ==========ON TREATMENT VISIT DATES========== 2024-03-29, 2024-04-05, 2024-04-12, 2024-04-19, 2024-04-26     ==========UPCOMING VISITS==========       ==========APPENDIX - ON TREATMENT VISIT NOTES==========   See weekly On Treatment Notes in Epic for details in the Media tab (listed as Progress notes on the On Treatment Visit Dates listed above).

## 2024-04-29 ENCOUNTER — Encounter: Payer: Self-pay | Admitting: Oncology

## 2024-04-29 ENCOUNTER — Inpatient Hospital Stay

## 2024-04-29 ENCOUNTER — Inpatient Hospital Stay (HOSPITAL_BASED_OUTPATIENT_CLINIC_OR_DEPARTMENT_OTHER): Admitting: Oncology

## 2024-04-29 VITALS — BP 116/70 | HR 72 | Temp 97.7°F | Resp 19

## 2024-04-29 VITALS — BP 114/70 | HR 92 | Temp 96.0°F | Resp 19 | Ht 65.0 in | Wt 206.6 lb

## 2024-04-29 DIAGNOSIS — C50312 Malignant neoplasm of lower-inner quadrant of left female breast: Secondary | ICD-10-CM

## 2024-04-29 DIAGNOSIS — L589 Radiodermatitis, unspecified: Secondary | ICD-10-CM | POA: Diagnosis not present

## 2024-04-29 DIAGNOSIS — Z171 Estrogen receptor negative status [ER-]: Secondary | ICD-10-CM

## 2024-04-29 DIAGNOSIS — G620 Drug-induced polyneuropathy: Secondary | ICD-10-CM

## 2024-04-29 DIAGNOSIS — Z5112 Encounter for antineoplastic immunotherapy: Secondary | ICD-10-CM

## 2024-04-29 DIAGNOSIS — T451X5A Adverse effect of antineoplastic and immunosuppressive drugs, initial encounter: Secondary | ICD-10-CM

## 2024-04-29 MED ORDER — SODIUM CHLORIDE 0.9 % IV SOLN
INTRAVENOUS | Status: DC
  Filled 2024-04-29: qty 250

## 2024-04-29 MED ORDER — TRASTUZUMAB-ANNS CHEMO 150 MG IV SOLR
6.0000 mg/kg | Freq: Once | INTRAVENOUS | Status: AC
  Administered 2024-04-29: 600 mg via INTRAVENOUS
  Filled 2024-04-29: qty 28.57

## 2024-04-29 MED ORDER — DIPHENHYDRAMINE HCL 25 MG PO CAPS
25.0000 mg | ORAL_CAPSULE | Freq: Once | ORAL | Status: AC
  Administered 2024-04-29: 25 mg via ORAL
  Filled 2024-04-29: qty 1

## 2024-04-29 MED ORDER — HEPARIN SOD (PORK) LOCK FLUSH 100 UNIT/ML IV SOLN
500.0000 [IU] | Freq: Once | INTRAVENOUS | Status: AC | PRN
  Administered 2024-04-29: 500 [IU]
  Filled 2024-04-29: qty 5

## 2024-04-29 MED ORDER — ACETAMINOPHEN 325 MG PO TABS
650.0000 mg | ORAL_TABLET | Freq: Once | ORAL | Status: AC
Start: 2024-04-29 — End: 2024-04-29
  Administered 2024-04-29: 650 mg via ORAL
  Filled 2024-04-29: qty 2

## 2024-04-29 NOTE — Progress Notes (Signed)
 Per Dr. Randy Buttery reach out to radiation to inform patient is experiencing worsening radiation dermatitis.  Relayed information to [employer].

## 2024-04-29 NOTE — Progress Notes (Signed)
 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  Ultrasound of the left axilla demonstrated normal lymph nodes.  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  8 mm.  Tumor was ER/PR -0% and HER2 positive +3 by IHC.    Menarche at the age of 78.  She is G5, P5.  Age at first birth 11.  She has used birth control pills in the past.  She attained menopause in her 26s.  Family history significant for breast cancer in 2 sisters of her maternal grandmother.  Mom with cervical cancer in her 70s.  Paternal uncle with stomach cancer.  Her sister has been diagnosed with some form of skin cancer.   Left lumpectomy pathology from 11/04/2023 showed 10 mm grade 3 tumor with negative margins.  3 sentinel lymph nodes negative for malignancy.  Tumor was ER/PR negative and HER2 positive.  Patient had significant infusion reaction with first cycle of Taxol .   She subsequently went on to do 9 cycles of weekly Abraxane  and Herceptin .  She had worsening neuropathy skin rash and edema with Abraxane  and was subsequently stopped.  She is currently undergoing adjuvant radiation therapy and receiving maintenance Herceptin     Interval history-patient is currently undergoing radiation and has been having burning pain over her left breast area due to radiation dermatitis.  She is using silver  sulfadiazine  for the same.  Tolerating Herceptin  well without any significant skin rash or diarrhea.  ECOG PS- 1 Pain scale- 0   Review of systems- Review of Systems  Constitutional:  Negative for chills, fever, malaise/fatigue and weight loss.  HENT:  Negative for congestion, ear discharge and nosebleeds.   Eyes:  Negative for blurred vision.  Respiratory:  Negative for cough, hemoptysis, sputum production, shortness of breath and wheezing.   Cardiovascular:  Negative for chest pain, palpitations, orthopnea and claudication.  Gastrointestinal:  Negative for abdominal pain, blood in stool, constipation, diarrhea, heartburn, melena, nausea and vomiting.  Genitourinary:  Negative for dysuria, flank pain, frequency, hematuria and urgency.  Musculoskeletal:  Negative for back pain, joint pain and myalgias.  Skin:  Negative for rash.  Neurological:  Negative for dizziness, tingling, focal weakness, seizures, weakness and headaches.  Endo/Heme/Allergies:  Does not bruise/bleed easily.  Psychiatric/Behavioral:  Negative for depression and suicidal ideas. The patient does not have insomnia.       Allergies  Allergen Reactions   Paclitaxel  Shortness Of Breath, Other (See Comments) and Hypertension    Redness to face/chest Back Pain  Flagyl [Metronidazole] Itching     Past Medical History:  Diagnosis Date   Adenoma of appendix    Anemia    Chronic lower back pain    GERD (gastroesophageal reflux disease)    H/O gestational diabetes mellitus, not currently  pregnant    History of kidney stones    Hypercholesteremia    Hyperplastic rectal polyp    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, estrogen receptor negative (HCC)    Obesity    Ovarian cyst    Palpitations    Sigmoid diverticulitis 08/2012   Subclinical hypothyroidism    SVT (supraventricular tachycardia) (HCC)    on Holter monitor 5 beat run of svt   Thyroid  nodule    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   Type 2 diabetes mellitus (HCC)      Past Surgical History:  Procedure Laterality Date   AXILLARY SENTINEL NODE BIOPSY Left 11/04/2023   Procedure: AXILLARY SENTINEL NODE BIOPSY;  Surgeon: Flynn Hylan, MD;  Location: [facility];  Service: General;  Laterality: Left;   BLADDER SUSPENSION  2009   BREAST BIOPSY Left 10/22/2023   Us  Core Bx, heart clip - path pending   BREAST BIOPSY Left 10/22/2023   US  LT BREAST BX W LOC DEV 1ST LESION IMG BX SPEC US  GUIDE 10/22/2023 [facility]   BREAST LUMPECTOMY WITH RADIOFREQUENCY TAG IDENTIFICATION Left 11/04/2023   Procedure: BREAST LUMPECTOMY WITH RADIOFREQUENCY TAG IDENTIFICATION;  Surgeon: Flynn Hylan, MD;  Location: [facility];  Service: General;  Laterality: Left;   Colectomy Left    likely sigmoid, open.  for diverticulitis.   COLONOSCOPY WITH PROPOFOL  N/A 10/01/2022   Procedure: COLONOSCOPY WITH PROPOFOL ;  Surgeon: Selena Daily, MD;  Location: [facility];  Service: Gastroenterology;  Laterality: N/A;   PORTACATH PLACEMENT Right 11/04/2023   Procedure: INSERTION PORT-A-CATH;  Surgeon: Flynn Hylan, MD;  Location: [facility] OR[facility];  Service: General;  Laterality: Right;   TUBAL LIGATION     XI ROBOTIC LAPAROSCOPIC ASSISTED APPENDECTOMY N/A 12/29/2022   Procedure: XI ROBOTIC LYSIS OF ADHESIONS AND APPENDECTOMY;  Surgeon: Flynn Hylan, MD;  Location: [facility];  Service: General;  Laterality: N/A;    Social History   Socioeconomic History   Marital status: Single    Spouse name: Not on file   Number of  children: Not on file   Years of education: Not on file   Highest education level: Some college, no degree  Occupational History   Not on file  Tobacco Use   Smoking status: Former    Current packs/day: 0.00    Types: Cigarettes, Cigars    Quit date: 09/19/2011    Years since quitting: 12.6   Smokeless tobacco: Never  Vaping Use   Vaping status: Former  Substance and Sexual Activity   Alcohol use: Not Currently    Comment: Occasional glass of wine   Drug use: No   Sexual activity: Not Currently  Other Topics Concern   Not on file  Social History Narrative   Works in Audiological scientist at [employer].   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   Social Drivers of Corporate investment banker Strain: Low Risk  (06/17/2023)   Overall Financial Resource Strain (CARDIA)    Difficulty of Paying Living Expenses: Not very hard  Food Insecurity: No Food Insecurity (10/28/2023)   Hunger Vital Sign    Worried About Running Out of Food in the Last Year: Never true    Ran Out of Food in the Last  Year: Never true  Transportation Needs: No Transportation Needs (10/28/2023)   PRAPARE - Administrator, Civil Service (Medical): No    Lack of Transportation (Non-Medical): No  Physical Activity: Sufficiently Active (06/17/2023)   Exercise Vital Sign    Days of Exercise per Week: 7 days    Minutes of Exercise per Session: 60 min  Stress: Stress Concern Present (06/17/2023)   [employer] of Occupational Health - Occupational Stress Questionnaire    Feeling of Stress : To some extent  Social Connections: Moderately Integrated (06/17/2023)   Social Connection and Isolation Panel [NHANES]    Frequency of Communication with Friends and Family: More than three times a week    Frequency of Social Gatherings with Friends and Family: Once a week    Attends Religious Services: More than 4 times per year    Active Member of [employer] or Organizations: Yes    Attends Banker  Meetings: More than 4 times per year    Marital Status: Divorced  Intimate Partner Violence: Not At Risk (10/28/2023)   Humiliation, Afraid, Rape, and Kick questionnaire    Fear of Current or Ex-Partner: No    Emotionally Abused: No    Physically Abused: No    Sexually Abused: No    Family History  Problem Relation Age of Onset   Cancer - Cervical Mother        dx 44s; Passed away from Kidney Issues (Not Cancer)   Heart disease Father    Kidney disease Father    Diabetes Father    Skin cancer Sister    Stomach cancer Paternal Uncle    Diabetes Maternal Grandmother    Skin cancer Maternal Grandfather    Diabetes Paternal Grandmother    Breast cancer Other        mat great aunts x2; unk age of dx   Lung cancer Cousin    Brain cancer Cousin      Current Outpatient Medications:    acetaminophen  (TYLENOL ) 500 MG tablet, Take 500 mg by mouth every 6 (six) hours as needed., Disp: , Rfl:    atenolol  (TENORMIN ) 25 MG tablet, TAKE 1 TABLET (25 MG TOTAL) BY MOUTH DAILY. FOR PALPITATIONS. (Patient taking differently: Take 25 mg by mouth at bedtime. For palpitations.), Disp: 90 tablet, Rfl: 2   Biotin 1000 MCG tablet, Take 1,000 mcg by mouth daily., Disp: , Rfl:    blood glucose meter kit and supplies KIT, Dispense based on patient and insurance preference. Use up to four times daily as directed. (FOR ICD-9 250.00, 250.01)., Disp: 1 each, Rfl: 0   cyanocobalamin 500 MCG tablet, Take 1,000 mcg by mouth daily. Reported on 06/12/2016, Disp: , Rfl:    docusate sodium (COLACE) 100 MG capsule, Take 200 mg by mouth daily as needed for moderate constipation. Reported on 06/12/2016, Disp: , Rfl:    furosemide  (LASIX ) 20 MG tablet, Take 1 tablet (20 mg total) by mouth daily., Disp: 30 tablet, Rfl: 0   gabapentin  (NEURONTIN ) 100 MG capsule, Take 1-2 capsules (100-200 mg total) by mouth at bedtime. For neuropathy, Disp: 180 capsule, Rfl: 0   ibuprofen  (ADVIL ) 800 MG tablet, Take 1 tablet (800 mg total) by  mouth every 8 (eight) hours as needed., Disp: 30 tablet, Rfl: 0   lidocaine -prilocaine  (EMLA ) cream, APPLY TO AFFECTED AREA ONCE AS DIRECTED, Disp: 30 g, Rfl: 3   magnesium 30 MG tablet, Take 30 mg by mouth daily as needed., Disp: , Rfl:  metFORMIN  (GLUCOPHAGE -XR) 500 MG 24 hr tablet, TAKE 1 TABLET (500 MG TOTAL) BY MOUTH DAILY WITH BREAKFAST. FOR DIABETES., Disp: 90 tablet, Rfl: 1   ondansetron  (ZOFRAN ) 8 MG tablet, Take 1 tablet (8 mg total) by mouth every 8 (eight) hours as needed for nausea or vomiting., Disp: 30 tablet, Rfl: 1   potassium chloride  SA (KLOR-CON  M) 20 MEQ tablet, Take 1 tablet (20 mEq total) by mouth 2 (two) times daily., Disp: 14 tablet, Rfl: 0   predniSONE  (DELTASONE ) 10 MG tablet, Take 1 tablet (10 mg total) by mouth daily with breakfast., Disp: 30 tablet, Rfl: 0   prochlorperazine  (COMPAZINE ) 10 MG tablet, Take 1 tablet (10 mg total) by mouth every 6 (six) hours as needed for nausea or vomiting., Disp: 30 tablet, Rfl: 1   pyridOXINE (VITAMIN B6) 25 MG tablet, Take 25 mg by mouth daily., Disp: , Rfl:    rosuvastatin  (CRESTOR ) 5 MG tablet, TAKE 1 TABLET (5 MG TOTAL) BY MOUTH EVERY EVENING. FOR CHOLESTEROL., Disp: 90 tablet, Rfl: 2   Semaglutide ,0.25 or 0.5MG /DOS, (OZEMPIC , 0.25 OR 0.5 MG/DOSE,) 2 MG/3ML SOPN, Inject 0.25 mg into the skin once weekly for 4 weeks, then increase to 0.5 mg once weekly thereafter for diabetes., Disp: 9 mL, Rfl: 1   silver  sulfADIAZINE  (SILVADENE ) 1 % cream, Apply 1 Application topically 2 (two) times daily., Disp: 50 g, Rfl: 0 No current facility-administered medications for this visit.  Facility-Administered Medications Ordered in Other Visits:    0.9 %  sodium chloride  infusion, , Intravenous, Continuous, Avonne Boettcher, MD, Stopped at 11/27/23 1158   0.9 %  sodium chloride  infusion, , Intravenous, Continuous, Avonne Boettcher, MD, Last Rate: 10 mL/hr at 04/29/24 1005, New Bag at 04/29/24 1005   heparin  lock flush 100 unit/mL, 500 Units,  Intracatheter, Once PRN, Avonne Boettcher, MD   trastuzumab -anns (KANJINTI ) 600 mg in sodium chloride  0.9 % 250 mL chemo infusion, 6 mg/kg (Treatment Plan Recorded), Intravenous, Once, Avonne Boettcher, MD, Last Rate: 557.1 mL/hr at 04/29/24 1011, 600 mg at 04/29/24 1011  Physical exam:  Vitals:   04/29/24 0836  BP: 114/70  Pulse: 92  Resp: 19  Temp: (!) 96 F (35.6 C)  TempSrc: Tympanic  SpO2: 97%  Weight: 206 lb 9.6 oz (93.7 kg)  Height: 5\' 5"  (1.651 m)   Physical Exam Cardiovascular:     Rate and Rhythm: Normal rate and regular rhythm.     Heart sounds: Normal heart sounds.  Pulmonary:     Effort: Pulmonary effort is normal.     Breath sounds: Normal breath sounds.  Skin:    General: Skin is warm and dry.  Neurological:     Mental Status: She is alert and oriented to person, place, and time.      I have personally reviewed labs listed below:    Latest Ref Rng & Units 04/08/2024    8:42 AM  CMP  Glucose 70 - 99 mg/dL 161   BUN 8 - 23 mg/dL 7   Creatinine 0.96 - 0.45 mg/dL 4.09   Sodium 811 - 914 mmol/L 137   Potassium 3.5 - 5.1 mmol/L 3.4   Chloride 98 - 111 mmol/L 107   CO2 22 - 32 mmol/L 23   Calcium  8.9 - 10.3 mg/dL 8.7   Total Protein 6.5 - 8.1 g/dL 7.0   Total Bilirubin 0.0 - 1.2 mg/dL 0.3   Alkaline Phos 38 - 126 U/L 81   AST 15 - 41 U/L 51  ALT 0 - 44 U/L 37       Latest Ref Rng & Units 04/21/2024    2:12 PM  CBC  WBC 4.0 - 10.5 K/uL 4.0   Hemoglobin 12.0 - 15.0 g/dL 40.9   Hematocrit 81.1 - 46.0 % 39.2   Platelets 150 - 400 K/uL 261      Assessment and plan- Patient is a 63 y.o. 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  She is s/p 9 weekly cycles of Abraxane  and Herceptin  which was subsequently stopped due to intolerance.  She is here for on treatment assessment prior to cycle 6 of maintenance Herceptin   Counseled to proceed with cycle 6  of Herceptin  today.  I will see her back in 3 weeks for cycle 7.  Plan to repeat echocardiogram in about 4 weeks time.  Radiation dermatitis involving the left breast: She is presently on silver  sulfadiazine  cream and we will reach out to radiation oncology to see if they would like to add any topical steroids.  Chemo induced peripheral neuropathy:Continue with gabapentin    Visit Diagnosis 1. 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 in female, estrogen receptor negative (HCC)   2. Encounter for monoclonal antibody treatment for malignancy   3. Radiation dermatitis   4. Chemotherapy-induced peripheral neuropathy (HCC)      Dr. Seretha Dance, MD, MPH [facility] [phone] 04/29/2024 10:35 AM

## 2024-05-11 ENCOUNTER — Other Ambulatory Visit

## 2024-05-11 ENCOUNTER — Ambulatory Visit
Admission: RE | Admit: 2024-05-11 | Discharge: 2024-05-11 | Disposition: A | Discharge status: Home / Self Care | Source: Ambulatory Visit | Attending: Surgery | Admitting: Surgery

## 2024-05-11 ENCOUNTER — Encounter

## 2024-05-11 DIAGNOSIS — Z171 Estrogen receptor negative status [ER-]: Secondary | ICD-10-CM

## 2024-05-11 DIAGNOSIS — C50312 Malignant neoplasm of lower-inner quadrant of left female breast: Secondary | ICD-10-CM | POA: Insufficient documentation

## 2024-05-11 HISTORY — DX: Personal history of irradiation: Z92.3

## 2024-05-11 HISTORY — DX: Personal history of antineoplastic chemotherapy: Z92.21

## 2024-05-17 ENCOUNTER — Other Ambulatory Visit: Payer: Self-pay | Admitting: Primary Care

## 2024-05-17 DIAGNOSIS — E119 Type 2 diabetes mellitus without complications: Secondary | ICD-10-CM

## 2024-05-18 NOTE — Progress Notes (Signed)
 [facility] POST-OP OFFICE VISIT  05/18/2024  HPI: Rebecca Macias is a 63 y.o. female is s/p Scout tag localized left breast lumpectomy and left sentinel lymph node biopsy and Port-A-Cath placement.  The pathology report noted below.  She relates the story of how neuropathy halted her Abraxane  treatment at 10 of 12 planned doses.  Expected mild discomforts from post radiation changes, but no lingering axillary pain or tenderness.  1. Lymph node, sentinel, biopsy, #1-#2 left axilla :      - THREE LYMPH NODES NEGATIVE FOR MALIGNANCY (0/3).       2. 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      - DUCTAL CARCINOMA IN SITU (DCIS).      - SEE CANCER SUMMARY BELOW.      - PRIOR BIOPSY SITE CHANGE WITH CLIP.      - SCOUT TAG PRESENT.  ELECTRONIC SIGNATURE : Rubinas Md, [place] Ida , Sports administrator, International aid/development worker  MICROSCOPIC DESCRIPTION 1. CANCER CASE SUMMARY: INVASIVE CARCINOMA OF THE BREAST Standard(s): AJCC-UICC 8  SPECIMEN Procedure: Lumpectomy Specimen Laterality: Left  TUMOR Histologic Type: Invasive carcinoma of no special type (ductal) Histologic Grade (Nottingham Histologic Score) Glandular (Acinar)/Tubular Differentiation: 3 Nuclear Pleomorphism: 3 Mitotic Rate: 2 Overall Grade: 3 Tumor Size: Greatest dimension of largest invasive focus: 10 mm Ductal Carcinoma In Situ (DCIS): Present, high-grade Tumor Extent: Not applicable Lymphatic and/or Vascular Invasion: Not identified Treatment Effect in the Breast: No known presurgical therapy  MARGINS Margin Status for Invasive Carcinoma: All margins negative for invasive carcinoma Distance from closest margin: 0.4 mm Specify closest margin: Inferior Margin Status for DCIS: All margins negative for DCIS Distance from DCIS to closest margin: 0.1 mm Specify closest margin: Inferior                      REGIONAL LYMPH NODES Regional Lymph Node Status: All  regional lymph nodes negative for tumor Total Number of Lymph Nodes Examined (sentinel and non-sentinel): 3 Number of Sentinel Nodes Examined: 3   Vital signs: LMP 11/03/2012    Physical Exam: Constitutional: She appears well.  No distress.  Skin: Peau d'orange like postradiation changes, without the pink hue.  Prior seromatous knot in the left axilla has resolved.  There is no other suspicious nor dominant breast nodularity or asymmetry appreciated aside from that anticipated.  No evidence of induration, no evidence of cellulitis or seroma.    Right subclavian port site, without issue.   Follow-up mammography: CLINICAL DATA:  Status post LEFT breast lumpectomy in November 2024 for invasive mammary carcinoma, with negative margins. Patient underwent adjuvant radiation therapy. She underwent adjuvant chemotherapy, however this will stopped due to neuropathy side effects.   EXAM: DIGITAL DIAGNOSTIC UNILATERAL LEFT MAMMOGRAM WITH TOMOSYNTHESIS AND CAD   TECHNIQUE: Left digital diagnostic mammography and breast tomosynthesis was performed. The images were evaluated with computer-aided detection.   COMPARISON:  Previous exam(s).   ACR Breast Density Category c: The breasts are heterogeneously dense, which may obscure small masses.   FINDINGS: Interval density with associated architectural distortion in the retroareolar left breast, consistent with post lumpectomy changes. There is also diffuse left breast trabecular and skin thickening, consistent with post radiation changes. No suspicious calcifications are noted on the spot magnification view of the lumpectomy bed.   No new suspicious mass, calcification, other findings are identified in the left breast.   IMPRESSION: Interval posttreatment changes in the  LEFT breast. No evidence of malignancy.   RECOMMENDATION: BILATERAL diagnostic mammogram due in October 2025 to continue with post lumpectomy surveillance protocol and  annual examination of the right breast.   I have discussed the findings and recommendations with the patient. If applicable, a reminder letter will be sent to the patient regarding the next appointment.   BI-RADS CATEGORY  2: Benign.     Electronically Signed   By: Sande Cromer M.D.   On: 05/11/2024 11:11     Assessment/Plan: This is a 63 y.o. female s/p right subclavian Port-A-Cath placement, Scout tagged left breast lumpectomy and left axillary sentinel lymph node biopsy.  No evidence of locally recurrent disease.  Patient Active Problem List   Diagnosis Date Noted   Chemotherapy induced neutropenia (HCC) 12/15/2023   Neuropathy 12/15/2023   Encounter for antineoplastic chemotherapy 12/15/2023   Genetic testing 11/18/2023   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 in female, estrogen receptor negative (HCC) 10/28/2023   Goals of care, counseling/discussion 10/28/2023   Allergic reaction 06/18/2023   Postoperative adhesions 12/29/2022   Hyperplastic rectal polyp    Chronic low back pain with left-sided sciatica 10/10/2021   Non-insulin  dependent type 2 diabetes mellitus (HCC) 07/10/2016   GERD (gastroesophageal reflux disease) 07/02/2016   Subclinical hyperthyroidism 06/12/2016   Palpitations 05/08/2015   Insomnia 05/08/2015   Symptoms, such as flushing, sleeplessness, headache, lack of concentration, associated with the menopause 05/08/2015   Thyroid  nodule, cold 02/12/2015   Routine general medical examination at a health care facility 04/20/2014   Obesity (BMI 35.0-39.9 without comorbidity) 09/01/2012   Ovarian cyst     -BILATERAL diagnostic mammogram due in October 2025 with clinical exam at that time.   Flynn Hylan M.D., FACS 05/18/2024, 9:44 PM

## 2024-05-19 ENCOUNTER — Ambulatory Visit: Admitting: Surgery

## 2024-05-19 ENCOUNTER — Encounter: Payer: Self-pay | Admitting: Surgery

## 2024-05-19 VITALS — BP 124/77 | HR 81 | Temp 98.5°F | Ht 65.0 in | Wt 201.0 lb

## 2024-05-19 DIAGNOSIS — Z171 Estrogen receptor negative status [ER-]: Secondary | ICD-10-CM

## 2024-05-19 DIAGNOSIS — C50312 Malignant neoplasm of lower-inner quadrant of left female breast: Secondary | ICD-10-CM | POA: Diagnosis not present

## 2024-05-19 DIAGNOSIS — Z08 Encounter for follow-up examination after completed treatment for malignant neoplasm: Secondary | ICD-10-CM | POA: Diagnosis not present

## 2024-05-19 NOTE — Patient Instructions (Signed)
 Breast Self-Awareness Breast self-awareness is knowing how your breasts look and feel. You need to: Check your breasts on a regular basis. Tell your doctor about any changes. Become familiar with the look and feel of your breasts. This can help you catch a breast problem while it is still small and can be treated. You should do breast self-exams even if you have breast implants. What you need: A mirror. A well-lit room. A pillow or other soft object. How to do a breast self-exam Follow these steps to do a breast self-exam: Look for changes  Take off all the clothes above your waist. Stand in front of a mirror in a room with good lighting. Put your hands down at your sides. Compare your breasts in the mirror. Look for any difference between them, such as: A difference in shape. A difference in size. Wrinkles, dips, and bumps in one breast and not the other. Look at each breast for changes in the skin, such as: Redness. Scaly areas. Skin that has gotten thicker. Dimpling. Open sores (ulcers). Look for changes in your nipples, such as: Fluid coming out of a nipple. Fluid around a nipple. Bleeding. Dimpling. Redness. A nipple that looks pushed in (retracted), or that has changed position. Feel for changes Lie on your back. Feel each breast. To do this: Pick a breast to feel. Place a pillow under the shoulder closest to that breast. Put the arm closest to that breast behind your head. Feel the nipple area of that breast using the hand of your other arm. Feel the area with the pads of your three middle fingers by making small circles with your fingers. Use light, medium, and firm pressure. Continue the overlapping circles, moving downward over the breast. Keep making circles with your fingers. Stop when you feel your ribs. Start making circles with your fingers again, this time going upward until you reach your collarbone. Then, make circles outward across your breast and into your  armpit area. Squeeze your nipple. Check for discharge and lumps. Repeat these steps to check your other breast. Sit or stand in the tub or shower. With soapy water on your skin, feel each breast the same way you did when you were lying down. Write down what you find Writing down what you find can help you remember what to tell your doctor. Write down: What is normal for each breast. Any changes you find in each breast. These include: The kind of changes you find. A tender or painful breast. Any lump you find. Write down its size and where it is. When you last had your monthly period (menstrual cycle). General tips If you are breastfeeding, the best time to check your breasts is after you feed your baby or after you use a breast pump. If you get monthly bleeding, the best time to check your breasts is 5-7 days after your monthly cycle ends. With time, you will become comfortable with the self-exam. You will also start to know if there are changes in your breasts. Contact a doctor if: You see a change in the shape or size of your breasts or nipples. You see a change in the skin of your breast or nipples, such as red or scaly skin. You have fluid coming from your nipples that is not normal. You find a new lump or thick area. You have breast pain. You have any concerns about your breast health. Summary Breast self-awareness includes looking for changes in your breasts and feeling for changes  within your breasts. You should do breast self-awareness in front of a mirror in a well-lit room. If you get monthly periods (menstrual cycles), the best time to check your breasts is 5-7 days after your period ends. Tell your doctor about any changes you see in your breasts. Changes include changes in size, changes on the skin, painful or tender breasts, or fluid from your nipples that is not normal. This information is not intended to replace advice given to you by your health care provider. Make sure  you discuss any questions you have with your health care provider. Document Revised: 05/01/2022 Document Reviewed: 09/26/2021 Elsevier Patient Education  2024 [employer].

## 2024-05-20 ENCOUNTER — Inpatient Hospital Stay: Attending: Oncology

## 2024-05-20 ENCOUNTER — Encounter: Payer: Self-pay | Admitting: Oncology

## 2024-05-20 ENCOUNTER — Inpatient Hospital Stay

## 2024-05-20 ENCOUNTER — Inpatient Hospital Stay (HOSPITAL_BASED_OUTPATIENT_CLINIC_OR_DEPARTMENT_OTHER): Admitting: Oncology

## 2024-05-20 VITALS — BP 114/65 | HR 74

## 2024-05-20 VITALS — BP 127/66 | HR 84 | Temp 98.0°F | Resp 16 | Wt 203.0 lb

## 2024-05-20 DIAGNOSIS — C50312 Malignant neoplasm of lower-inner quadrant of left female breast: Secondary | ICD-10-CM

## 2024-05-20 DIAGNOSIS — Z171 Estrogen receptor negative status [ER-]: Secondary | ICD-10-CM

## 2024-05-20 DIAGNOSIS — Z1731 Human epidermal growth factor receptor 2 positive status: Secondary | ICD-10-CM | POA: Diagnosis not present

## 2024-05-20 DIAGNOSIS — Z5112 Encounter for antineoplastic immunotherapy: Secondary | ICD-10-CM | POA: Diagnosis present

## 2024-05-20 DIAGNOSIS — G620 Drug-induced polyneuropathy: Secondary | ICD-10-CM | POA: Diagnosis not present

## 2024-05-20 DIAGNOSIS — T451X5A Adverse effect of antineoplastic and immunosuppressive drugs, initial encounter: Secondary | ICD-10-CM | POA: Diagnosis not present

## 2024-05-20 DIAGNOSIS — Z79899 Other long term (current) drug therapy: Secondary | ICD-10-CM | POA: Insufficient documentation

## 2024-05-20 DIAGNOSIS — Z1722 Progesterone receptor negative status: Secondary | ICD-10-CM | POA: Insufficient documentation

## 2024-05-20 LAB — CMP (CANCER CENTER ONLY)
ALT: 40 U/L (ref 0–44)
AST: 59 U/L — ABNORMAL HIGH (ref 15–41)
Albumin: 3.6 g/dL (ref 3.5–5.0)
Alkaline Phosphatase: 92 U/L (ref 38–126)
Anion gap: 8 (ref 5–15)
BUN: 13 mg/dL (ref 8–23)
CO2: 23 mmol/L (ref 22–32)
Calcium: 8.7 mg/dL — ABNORMAL LOW (ref 8.9–10.3)
Chloride: 108 mmol/L (ref 98–111)
Creatinine: 0.76 mg/dL (ref 0.44–1.00)
GFR, Estimated: >60 mL/min (ref >60)
Glucose, Bld: 159 mg/dL — ABNORMAL HIGH (ref 70–99)
Potassium: 4 mmol/L (ref 3.5–5.1)
Sodium: 139 mmol/L (ref 135–145)
Total Bilirubin: 0.5 mg/dL (ref 0.0–1.2)
Total Protein: 7.2 g/dL (ref 6.5–8.1)

## 2024-05-20 LAB — CBC WITH DIFFERENTIAL/PLATELET
Abs Immature Granulocytes: 0.01 10*3/uL (ref 0.00–0.07)
Basophils Absolute: 0 10*3/uL (ref 0.0–0.1)
Basophils Relative: 1 %
Eosinophils Absolute: 0.3 10*3/uL (ref 0.0–0.5)
Eosinophils Relative: 8 %
HCT: 38.9 % (ref 36.0–46.0)
Hemoglobin: 12.9 g/dL (ref 12.0–15.0)
Immature Granulocytes: 0 %
Lymphocytes Relative: 43 %
Lymphs Abs: 1.4 10*3/uL (ref 0.7–4.0)
MCH: 29.2 pg (ref 26.0–34.0)
MCHC: 33.2 g/dL (ref 30.0–36.0)
MCV: 88 fL (ref 80.0–100.0)
Monocytes Absolute: 0.3 10*3/uL (ref 0.1–1.0)
Monocytes Relative: 9 %
Neutro Abs: 1.3 10*3/uL — ABNORMAL LOW (ref 1.7–7.7)
Neutrophils Relative %: 39 %
Platelets: 268 10*3/uL (ref 150–400)
RBC: 4.42 MIL/uL (ref 3.87–5.11)
RDW: 14.2 % (ref 11.5–15.5)
WBC: 3.3 10*3/uL — ABNORMAL LOW (ref 4.0–10.5)
nRBC: 0 % (ref 0.0–0.2)

## 2024-05-20 MED ORDER — HEPARIN SOD (PORK) LOCK FLUSH 100 UNIT/ML IV SOLN
500.0000 [IU] | Freq: Once | INTRAVENOUS | Status: AC | PRN
  Administered 2024-05-20: 500 [IU]
  Filled 2024-05-20: qty 5

## 2024-05-20 MED ORDER — TRASTUZUMAB-ANNS CHEMO 150 MG IV SOLR
6.0000 mg/kg | Freq: Once | INTRAVENOUS | Status: AC
  Administered 2024-05-20: 600 mg via INTRAVENOUS
  Filled 2024-05-20: qty 28.57

## 2024-05-20 MED ORDER — SODIUM CHLORIDE 0.9 % IV SOLN
INTRAVENOUS | Status: DC
  Filled 2024-05-20: qty 250

## 2024-05-20 MED ORDER — ACETAMINOPHEN 325 MG PO TABS
650.0000 mg | ORAL_TABLET | Freq: Once | ORAL | Status: AC
  Administered 2024-05-20: 650 mg via ORAL
  Filled 2024-05-20: qty 2

## 2024-05-20 MED ORDER — DIPHENHYDRAMINE HCL 25 MG PO CAPS
25.0000 mg | ORAL_CAPSULE | Freq: Once | ORAL | Status: AC
  Administered 2024-05-20: 25 mg via ORAL
  Filled 2024-05-20: qty 1

## 2024-05-20 NOTE — Progress Notes (Signed)
 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  Ultrasound of the left axilla demonstrated normal lymph nodes.  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  8 mm.  Tumor was ER/PR -0% and HER2 positive +3 by IHC.    Menarche at the age of 89.  She is G5, P5.  Age at first birth 62.  She has used birth control pills in the past.  She attained menopause in her 2s.  Family history significant for breast cancer in 2 sisters of her maternal grandmother.  Mom with cervical cancer in her 47s.  Paternal uncle with stomach cancer.  Her sister has been diagnosed with some form of skin cancer.   Left lumpectomy pathology from 11/04/2023 showed 10 mm grade 3 tumor with negative margins.  3 sentinel lymph nodes negative for malignancy.  Tumor was ER/PR negative and HER2 positive.  Patient had significant infusion reaction with first cycle of Taxol .   She subsequently went on to do 9 cycles of weekly Abraxane  and Herceptin .  She had worsening neuropathy skin rash and edema with Abraxane  and was subsequently stopped.  She is currently undergoing adjuvant radiation therapy and receiving maintenance Herceptin     Interval history-patient is tolerating Herceptin  well without any significant skin rash or diarrhea.  Leg swelling has resolved.  She has some ongoing nail changes due to prior chemotherapy which are gradually resolving.  ECOG PS- 0 Pain scale- 0   Review of systems- Review of Systems  Constitutional:  Negative for chills, fever, malaise/fatigue and weight loss.  HENT:  Negative for congestion, ear discharge and nosebleeds.   Eyes:  Negative for blurred vision.  Respiratory:  Negative for cough, hemoptysis, sputum production, shortness of breath and wheezing.   Cardiovascular:  Negative for chest pain, palpitations, orthopnea and claudication.  Gastrointestinal:  Negative for abdominal pain, blood in stool, constipation, diarrhea, heartburn, melena, nausea and vomiting.  Genitourinary:  Negative for dysuria, flank pain, frequency, hematuria and urgency.  Musculoskeletal:  Negative for back pain, joint pain and myalgias.  Skin:  Negative for rash.  Neurological:  Negative for dizziness, tingling, focal weakness, seizures, weakness and headaches.  Endo/Heme/Allergies:  Does not bruise/bleed easily.  Psychiatric/Behavioral:  Negative for depression and suicidal ideas. The patient does not have insomnia.       Allergies  Allergen Reactions   Paclitaxel  Shortness Of Breath, Other (See Comments) and Hypertension    Redness to face/chest Back Pain     Flagyl [Metronidazole] Itching     Past  Medical History:  Diagnosis Date   Adenoma of appendix    Anemia    Chronic lower back pain    GERD (gastroesophageal reflux disease)    H/O gestational diabetes mellitus, not currently pregnant    History of kidney stones     Hypercholesteremia    Hyperplastic rectal polyp    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, estrogen receptor negative (HCC)    Obesity    Ovarian cyst    Palpitations    Personal history of chemotherapy    Personal history of radiation therapy    Sigmoid diverticulitis 08/2012   Subclinical hypothyroidism    SVT (supraventricular tachycardia) (HCC)    on Holter monitor 5 beat run of svt   Thyroid  nodule    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   Type 2 diabetes mellitus (HCC)      Past Surgical History:  Procedure Laterality Date   AXILLARY SENTINEL NODE BIOPSY Left 11/04/2023   Procedure: AXILLARY SENTINEL NODE BIOPSY;  Surgeon: Flynn Hylan, MD;  Location: [facility] ORS;  Service: General;  Laterality: Left;   BLADDER SUSPENSION  2009   BREAST BIOPSY Left 10/22/2023   Us  Core Bx, heart clip - path pending   BREAST BIOPSY Left 10/22/2023   US  LT BREAST BX W LOC DEV 1ST LESION IMG BX SPEC US  GUIDE 10/22/2023 [facility]   BREAST LUMPECTOMY WITH RADIOFREQUENCY TAG IDENTIFICATION Left 11/04/2023   Procedure: BREAST LUMPECTOMY WITH RADIOFREQUENCY TAG IDENTIFICATION;  Surgeon: Flynn Hylan, MD;  Location: [facility];  Service: General;  Laterality: Left;   Colectomy Left    likely sigmoid, open.  for diverticulitis.   COLONOSCOPY WITH PROPOFOL  N/A 10/01/2022   Procedure: COLONOSCOPY WITH PROPOFOL ;  Surgeon: Selena Daily, MD;  Location: [facility] ENDOSCOPY;  Service: Gastroenterology;  Laterality: N/A;   PORTACATH PLACEMENT Right 11/04/2023   Procedure: INSERTION PORT-A-CATH;  Surgeon: Flynn Hylan, MD;  Location: [facility] OR[facility];  Service: General;  Laterality: Right;   TUBAL LIGATION     XI ROBOTIC LAPAROSCOPIC ASSISTED APPENDECTOMY N/A 12/29/2022   Procedure: XI ROBOTIC LYSIS OF ADHESIONS AND APPENDECTOMY;  Surgeon: Flynn Hylan, MD;  Location: [facility];  Service: General;  Laterality: N/A;    Social History   Socioeconomic History   Marital status: Single     Spouse name: Not on file   Number of children: Not on file   Years of education: Not on file   Highest education level: Some college, no degree  Occupational History   Not on file  Tobacco Use   Smoking status: Former    Current packs/day: 0.00    Types: Cigarettes, Cigars    Quit date: 09/19/2011    Years since quitting: 12.6   Smokeless tobacco: Never  Vaping Use   Vaping status: Former  Substance and Sexual Activity   Alcohol use: Not Currently    Comment: Occasional glass of wine   Drug use: No   Sexual activity: Not Currently  Other Topics Concern   Not on file  Social History Narrative   Works in Audiological scientist at [employer].   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   Social Drivers of Corporate investment banker Strain: Low Risk  (06/17/2023)   Overall Financial Resource Strain (CARDIA)    Difficulty of Paying Living Expenses: Not very hard  Food Insecurity: No Food Insecurity (10/28/2023)   Hunger Vital Sign    Worried About Running Out of Food in the Last Year: Never true  Ran Out of Food in the Last Year: Never true  Transportation Needs: No Transportation Needs (10/28/2023)   PRAPARE - Administrator, Civil Service (Medical): No    Lack of Transportation (Non-Medical): No  Physical Activity: Sufficiently Active (06/17/2023)   Exercise Vital Sign    Days of Exercise per Week: 7 days    Minutes of Exercise per Session: 60 min  Stress: Stress Concern Present (06/17/2023)   [employer] of Occupational Health - Occupational Stress Questionnaire    Feeling of Stress : To some extent  Social Connections: Moderately Integrated (06/17/2023)   Social Connection and Isolation Panel    Frequency of Communication with Friends and Family: More than three times a week    Frequency of Social Gatherings with Friends and Family: Once a week    Attends Religious Services: More than 4 times per year    Active Member of [employer] or Organizations: Yes     Attends Banker Meetings: More than 4 times per year    Marital Status: Divorced  Intimate Partner Violence: Not At Risk (10/28/2023)   Humiliation, Afraid, Rape, and Kick questionnaire    Fear of Current or Ex-Partner: No    Emotionally Abused: No    Physically Abused: No    Sexually Abused: No    Family History  Problem Relation Age of Onset   Cancer - Cervical Mother        dx 66s; Passed away from Kidney Issues (Not Cancer)   Heart disease Father    Kidney disease Father    Diabetes Father    Skin cancer Sister    Stomach cancer Paternal Uncle    Diabetes Maternal Grandmother    Skin cancer Maternal Grandfather    Diabetes Paternal Grandmother    Breast cancer Other        mat great aunts x2; unk age of dx   Lung cancer Cousin    Brain cancer Cousin      Current Outpatient Medications:    acetaminophen  (TYLENOL ) 500 MG tablet, Take 500 mg by mouth every 6 (six) hours as needed., Disp: , Rfl:    atenolol  (TENORMIN ) 25 MG tablet, TAKE 1 TABLET (25 MG TOTAL) BY MOUTH DAILY. FOR PALPITATIONS. (Patient taking differently: Take 25 mg by mouth at bedtime. For palpitations.), Disp: 90 tablet, Rfl: 2   Biotin 1000 MCG tablet, Take 1,000 mcg by mouth daily., Disp: , Rfl:    blood glucose meter kit and supplies KIT, Dispense based on patient and insurance preference. Use up to four times daily as directed. (FOR ICD-9 250.00, 250.01)., Disp: 1 each, Rfl: 0   cyanocobalamin 500 MCG tablet, Take 1,000 mcg by mouth daily. Reported on 06/12/2016, Disp: , Rfl:    docusate sodium (COLACE) 100 MG capsule, Take 200 mg by mouth daily as needed for moderate constipation. Reported on 06/12/2016, Disp: , Rfl:    ibuprofen  (ADVIL ) 800 MG tablet, Take 1 tablet (800 mg total) by mouth every 8 (eight) hours as needed., Disp: 30 tablet, Rfl: 0   lidocaine -prilocaine  (EMLA ) cream, APPLY TO AFFECTED AREA ONCE AS DIRECTED, Disp: 30 g, Rfl: 3   metFORMIN  (GLUCOPHAGE -XR) 500 MG 24 hr tablet, TAKE 1  TABLET (500 MG TOTAL) BY MOUTH DAILY WITH BREAKFAST. FOR DIABETES., Disp: 90 tablet, Rfl: 1   pyridOXINE (VITAMIN B6) 25 MG tablet, Take 25 mg by mouth daily., Disp: , Rfl:    rosuvastatin  (CRESTOR ) 5 MG tablet, TAKE 1 TABLET (5 MG TOTAL)  BY MOUTH EVERY EVENING. FOR CHOLESTEROL., Disp: 90 tablet, Rfl: 2   Semaglutide ,0.25 or 0.5MG /DOS, (OZEMPIC , 0.25 OR 0.5 MG/DOSE,) 2 MG/3ML SOPN, Inject 0.25 mg into the skin once weekly for 4 weeks, then increase to 0.5 mg once weekly thereafter for diabetes., Disp: 9 mL, Rfl: 1   silver  sulfADIAZINE  (SILVADENE ) 1 % cream, Apply 1 Application topically 2 (two) times daily., Disp: 50 g, Rfl: 0 No current facility-administered medications for this visit.  Facility-Administered Medications Ordered in Other Visits:    0.9 %  sodium chloride  infusion, , Intravenous, Continuous, Avonne Boettcher, MD, Stopped at 11/27/23 1158   0.9 %  sodium chloride  infusion, , Intravenous, Continuous, Avonne Boettcher, MD, Stopped at 05/20/24 1055  Physical exam:  Vitals:   05/20/24 0848  BP: 127/66  Pulse: 84  Resp: 16  Temp: 98 F (36.7 C)  TempSrc: Tympanic  SpO2: 98%  Weight: 203 lb (92.1 kg)   Physical Exam  Cardiovascular:     Rate and Rhythm: Normal rate and regular rhythm.     Heart sounds: Normal heart sounds.  Pulmonary:     Effort: Pulmonary effort is normal.     Breath sounds: Normal breath sounds.  Abdominal:     General: Bowel sounds are normal.     Palpations: Abdomen is soft.   Skin:    General: Skin is warm and dry.   Neurological:     Mental Status: She is alert and oriented to person, place, and time.      I have personally reviewed labs listed below:    Latest Ref Rng & Units 05/20/2024    8:39 AM  CMP  Glucose 70 - 99 mg/dL 161   BUN 8 - 23 mg/dL 13   Creatinine 0.96 - 1.00 mg/dL 0.45   Sodium 409 - 811 mmol/L 139   Potassium 3.5 - 5.1 mmol/L 4.0   Chloride 98 - 111 mmol/L 108   CO2 22 - 32 mmol/L 23   Calcium  8.9 - 10.3 mg/dL 8.7    Total Protein 6.5 - 8.1 g/dL 7.2   Total Bilirubin 0.0 - 1.2 mg/dL 0.5   Alkaline Phos 38 - 126 U/L 92   AST 15 - 41 U/L 59   ALT 0 - 44 U/L 40       Latest Ref Rng & Units 05/20/2024    8:39 AM  CBC  WBC 4.0 - 10.5 K/uL 3.3   Hemoglobin 12.0 - 15.0 g/dL 91.4   Hematocrit 78.2 - 46.0 % 38.9   Platelets 150 - 400 K/uL 268    I have personally reviewed Radiology images listed below: No images are attached to the encounter.  MM 3D DIAGNOSTIC MAMMOGRAM UNILATERAL LEFT BREAST Result Date: 05/11/2024 CLINICAL DATA:  Status post LEFT breast lumpectomy in November 2024 for invasive mammary carcinoma, with negative margins. Patient underwent adjuvant radiation therapy. She underwent adjuvant chemotherapy, however this will stopped due to neuropathy side effects. EXAM: DIGITAL DIAGNOSTIC UNILATERAL LEFT MAMMOGRAM WITH TOMOSYNTHESIS AND CAD TECHNIQUE: Left digital diagnostic mammography and breast tomosynthesis was performed. The images were evaluated with computer-aided detection. COMPARISON:  Previous exam(s). ACR Breast Density Category c: The breasts are heterogeneously dense, which may obscure small masses. FINDINGS: Interval density with associated architectural distortion in the retroareolar left breast, consistent with post lumpectomy changes. There is also diffuse left breast trabecular and skin thickening, consistent with post radiation changes. No suspicious calcifications are noted on the spot magnification view of the lumpectomy bed. No  new suspicious mass, calcification, other findings are identified in the left breast. IMPRESSION: Interval posttreatment changes in the LEFT breast. No evidence of malignancy. RECOMMENDATION: BILATERAL diagnostic mammogram due in October 2025 to continue with post lumpectomy surveillance protocol and annual examination of the right breast. I have discussed the findings and recommendations with the patient. If applicable, a reminder letter will be sent to the  patient regarding the next appointment. BI-RADS CATEGORY  2: Benign. Electronically Signed   By: Sande Cromer M.D.   On: 05/11/2024 11:11     Assessment and plan- Patient is a 63 y.o. 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 She is s/p 9 weekly cycles of Abraxane  and Herceptin  which was subsequently stopped due to intolerance.  She is here for a treatment assessment prior to cycle 7 of maintenance Herceptin   Counts okay to proceed with cycle 7 of maintenance Herceptin  today and I will see her back in 3 weeks for cycle 8.  She has a repeat echocardiogram scheduled towards the end of July.  Chemo-induced peripheral neuropathy: Continue gabapentin    Visit Diagnosis 1. 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 in female, estrogen receptor negative (HCC)   2. Encounter for monoclonal antibody treatment for malignancy   3. Chemotherapy-induced peripheral neuropathy (HCC)      Dr. Seretha Dance, MD, MPH [facility] [phone] 05/20/2024 12:50 PM

## 2024-05-25 ENCOUNTER — Ambulatory Visit: Admitting: Radiation Oncology

## 2024-05-26 ENCOUNTER — Ambulatory Visit

## 2024-05-27 ENCOUNTER — Ambulatory Visit: Payer: Self-pay | Admitting: Primary Care

## 2024-05-27 ENCOUNTER — Other Ambulatory Visit

## 2024-05-27 DIAGNOSIS — E119 Type 2 diabetes mellitus without complications: Secondary | ICD-10-CM

## 2024-05-27 LAB — POCT GLYCOSYLATED HEMOGLOBIN (HGB A1C): Hemoglobin A1C: 6.9 % — AB (ref 4.0–5.6)

## 2024-06-01 ENCOUNTER — Encounter: Payer: Self-pay | Admitting: Radiation Oncology

## 2024-06-01 ENCOUNTER — Ambulatory Visit
Admission: RE | Admit: 2024-06-01 | Discharge: 2024-06-01 | Disposition: A | Discharge status: Home / Self Care | Source: Ambulatory Visit | Attending: Radiation Oncology | Admitting: Radiation Oncology

## 2024-06-01 VITALS — BP 119/76 | HR 96 | Temp 98.5°F | Resp 16 | Wt 209.0 lb

## 2024-06-01 DIAGNOSIS — C50312 Malignant neoplasm of lower-inner quadrant of left female breast: Secondary | ICD-10-CM | POA: Diagnosis present

## 2024-06-01 DIAGNOSIS — Z171 Estrogen receptor negative status [ER-]: Secondary | ICD-10-CM | POA: Insufficient documentation

## 2024-06-01 NOTE — Progress Notes (Signed)
 Radiation Oncology Follow up Note  Name: Rebecca Macias   Date:   06/01/2024 MRN:  [id number] DOB: [birth year]    This 63 y.o. female presents to the clinic today for 1 month follow-up status post whole breast radiation to her left breast for stage Ia (pT1b N0 M0) ER/PR negative HER2/neu overexpressed invasive mammary carcinoma.  REFERRING PROVIDER: Gretta Comer POUR, NP  HPI: Patient is a 63 year old female now out 1 month after completed whole breast radiation to her left breast for stage Ia ER/PR negative HER2/neu overexpressed invasive mammary carcinoma.  She comes currently is on Herceptin  continuing to tolerate that well.  She specifically denies breast tenderness cough or bone pain.  Patient will not be on endocrine therapy based on the ER/PR negative nature of her disease..  COMPLICATIONS OF TREATMENT: none  FOLLOW UP COMPLIANCE: keeps appointments   PHYSICAL EXAM:  BP 119/76   Pulse 96   Temp 98.5 F (36.9 C) (Tympanic)   Resp 16   Wt 209 lb (94.8 kg)   LMP 11/03/2012   BMI 34.78 kg/m  Lungs are clear to A&P cardiac examination essentially unremarkable with regular rate and rhythm. No dominant mass or nodularity is noted in either breast in 2 positions examined. Incision is well-healed. No axillary or supraclavicular adenopathy is appreciated. Cosmetic result is excellent.  Well-developed well-nourished patient in NAD. HEENT reveals PERLA, EOMI, discs not visualized.  Oral cavity is clear. No oral mucosal lesions are identified. Neck is clear without evidence of cervical or supraclavicular adenopathy. Lungs are clear to A&P. Cardiac examination is essentially unremarkable with regular rate and rhythm without murmur rub or thrill. Abdomen is benign with no organomegaly or masses noted. Motor sensory and DTR levels are equal and symmetric in the upper and lower extremities. Cranial nerves II through XII are grossly intact. Proprioception is intact. No peripheral adenopathy or edema  is identified. No motor or sensory levels are noted. Crude visual fields are within normal range.  RADIOLOGY RESULTS: No current films for review  PLAN: At the present time patient is doing well 1 month out from whole breast radiation and pleased with her overall progress.  Of asked to see her back in 6 months for follow-up.  She continues on Herceptin  therapy through medical oncology.  Patient knows to call with any concerns.  I would like to take this opportunity to thank you for allowing me to participate in the care of your patient.SABRA Marcey Penton, MD

## 2024-06-02 ENCOUNTER — Other Ambulatory Visit: Payer: Self-pay

## 2024-06-09 ENCOUNTER — Ambulatory Visit: Admitting: Nurse Practitioner

## 2024-06-09 ENCOUNTER — Inpatient Hospital Stay: Attending: Oncology

## 2024-06-09 ENCOUNTER — Encounter: Payer: Self-pay | Admitting: Oncology

## 2024-06-09 ENCOUNTER — Inpatient Hospital Stay (HOSPITAL_BASED_OUTPATIENT_CLINIC_OR_DEPARTMENT_OTHER): Admitting: Oncology

## 2024-06-09 VITALS — BP 130/65 | HR 86 | Temp 98.6°F | Resp 19 | Wt 209.0 lb

## 2024-06-09 VITALS — BP 114/57 | HR 77

## 2024-06-09 DIAGNOSIS — Z1731 Human epidermal growth factor receptor 2 positive status: Secondary | ICD-10-CM | POA: Insufficient documentation

## 2024-06-09 DIAGNOSIS — Z171 Estrogen receptor negative status [ER-]: Secondary | ICD-10-CM | POA: Insufficient documentation

## 2024-06-09 DIAGNOSIS — C50312 Malignant neoplasm of lower-inner quadrant of left female breast: Secondary | ICD-10-CM

## 2024-06-09 DIAGNOSIS — Z5112 Encounter for antineoplastic immunotherapy: Secondary | ICD-10-CM | POA: Diagnosis present

## 2024-06-09 DIAGNOSIS — Z79899 Other long term (current) drug therapy: Secondary | ICD-10-CM | POA: Insufficient documentation

## 2024-06-09 DIAGNOSIS — Z1722 Progesterone receptor negative status: Secondary | ICD-10-CM | POA: Diagnosis not present

## 2024-06-09 DIAGNOSIS — T451X5A Adverse effect of antineoplastic and immunosuppressive drugs, initial encounter: Secondary | ICD-10-CM

## 2024-06-09 DIAGNOSIS — G620 Drug-induced polyneuropathy: Secondary | ICD-10-CM

## 2024-06-09 MED ORDER — TRASTUZUMAB-ANNS CHEMO 150 MG IV SOLR
6.0000 mg/kg | Freq: Once | INTRAVENOUS | Status: AC
  Administered 2024-06-09: 600 mg via INTRAVENOUS
  Filled 2024-06-09: qty 28.57

## 2024-06-09 MED ORDER — SODIUM CHLORIDE 0.9 % IV SOLN
INTRAVENOUS | Status: DC
  Filled 2024-06-09: qty 250

## 2024-06-09 MED ORDER — ACETAMINOPHEN 325 MG PO TABS
650.0000 mg | ORAL_TABLET | Freq: Once | ORAL | Status: AC
  Administered 2024-06-09: 650 mg via ORAL
  Filled 2024-06-09: qty 2

## 2024-06-09 MED ORDER — DIPHENHYDRAMINE HCL 25 MG PO CAPS
25.0000 mg | ORAL_CAPSULE | Freq: Once | ORAL | Status: AC
  Administered 2024-06-09: 25 mg via ORAL
  Filled 2024-06-09: qty 1

## 2024-06-09 MED ORDER — HEPARIN SOD (PORK) LOCK FLUSH 100 UNIT/ML IV SOLN
500.0000 [IU] | Freq: Once | INTRAVENOUS | Status: AC | PRN
  Administered 2024-06-09: 500 [IU]
  Filled 2024-06-09: qty 5

## 2024-06-09 NOTE — Progress Notes (Signed)
 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  Ultrasound of the left axilla demonstrated normal lymph nodes.  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  8 mm.  Tumor was ER/PR -0% and HER2 positive +3 by IHC.    Menarche at the age of 52.  She is G5, P5.  Age at first birth 35.  She has used birth control pills in the past.  She attained menopause in her 110s.  Family history significant for breast cancer in 2 sisters of her maternal grandmother.  Mom with cervical cancer in her 28s.  Paternal uncle with stomach cancer.  Her sister has been diagnosed with some form of skin cancer.   Left lumpectomy pathology from 11/04/2023 showed 10 mm grade 3 tumor with negative margins.  3 sentinel lymph nodes negative for malignancy.  Tumor was ER/PR negative and HER2 positive.  Patient had significant infusion reaction with first cycle of Taxol .   She subsequently went on to do 9 cycles of weekly Abraxane  and Herceptin .  She had worsening neuropathy skin rash and edema with Abraxane  and was subsequently stopped.  She is currently undergoing adjuvant radiation therapy and receiving maintenance Herceptin     Interval history-tolerating treatments well so far without any skin rash or diarrhea.  Radiation dermatitis has healed  ECOG PS- 0 Pain scale- 0   Review of systems- Review of Systems  Constitutional:  Negative for chills, fever, malaise/fatigue and weight loss.  HENT:  Negative for congestion, ear discharge and nosebleeds.   Eyes:  Negative for blurred vision.  Respiratory:  Negative for cough, hemoptysis, sputum production, shortness of breath and wheezing.   Cardiovascular:  Negative for chest pain, palpitations, orthopnea and claudication.  Gastrointestinal:  Negative for abdominal pain, blood in stool, constipation, diarrhea, heartburn, melena, nausea and vomiting.  Genitourinary:  Negative for dysuria, flank pain, frequency, hematuria and urgency.  Musculoskeletal:  Negative for back pain, joint pain and myalgias.  Skin:  Negative for rash.  Neurological:  Negative for dizziness, tingling, focal weakness, seizures, weakness and headaches.  Endo/Heme/Allergies:  Does not bruise/bleed easily.  Psychiatric/Behavioral:  Negative for depression and suicidal ideas. The patient does not have insomnia.       Allergies  Allergen Reactions   Paclitaxel  Shortness Of Breath, Other (See Comments) and Hypertension    Redness to face/chest Back Pain     Flagyl [Metronidazole] Itching     Past Medical History:  Diagnosis Date   Adenoma of appendix    Anemia  Chronic lower back pain    GERD (gastroesophageal reflux disease)    H/O gestational diabetes mellitus, not currently pregnant    History of kidney stones    Hypercholesteremia    Hyperplastic rectal polyp    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,  estrogen receptor negative (HCC)    Obesity    Ovarian cyst    Palpitations    Personal history of chemotherapy    Personal history of radiation therapy    Sigmoid diverticulitis 08/2012   Subclinical hypothyroidism    SVT (supraventricular tachycardia) (HCC)    on Holter monitor 5 beat run of svt   Thyroid  nodule    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   Type 2 diabetes mellitus (HCC)      Past Surgical History:  Procedure Laterality Date   AXILLARY SENTINEL NODE BIOPSY Left 11/04/2023   Procedure: AXILLARY SENTINEL NODE BIOPSY;  Surgeon: Lane Shope, MD;  Location: [facility] ORS;  Service: General;  Laterality: Left;   BLADDER SUSPENSION  2009   BREAST BIOPSY Left 10/22/2023   Us  Core Bx, heart clip - path pending   BREAST BIOPSY Left 10/22/2023   US  LT BREAST BX W LOC DEV 1ST LESION IMG BX SPEC US  GUIDE 10/22/2023 [facility]   BREAST LUMPECTOMY WITH RADIOFREQUENCY TAG IDENTIFICATION Left 11/04/2023   Procedure: BREAST LUMPECTOMY WITH RADIOFREQUENCY TAG IDENTIFICATION;  Surgeon: Lane Shope, MD;  Location: [facility];  Service: General;  Laterality: Left;   Colectomy Left    likely sigmoid, open.  for diverticulitis.   COLONOSCOPY WITH PROPOFOL  N/A 10/01/2022   Procedure: COLONOSCOPY WITH PROPOFOL ;  Surgeon: Unk Corinn Skiff, MD;  Location: [facility];  Service: Gastroenterology;  Laterality: N/A;   PORTACATH PLACEMENT Right 11/04/2023   Procedure: INSERTION PORT-A-CATH;  Surgeon: Lane Shope, MD;  Location: [facility];  Service: General;  Laterality: Right;   TUBAL LIGATION     XI ROBOTIC LAPAROSCOPIC ASSISTED APPENDECTOMY N/A 12/29/2022   Procedure: XI ROBOTIC LYSIS OF ADHESIONS AND APPENDECTOMY;  Surgeon: Lane Shope, MD;  Location: [facility];  Service: General;  Laterality: N/A;    Social History   Socioeconomic History   Marital status: Single    Spouse name: Not on file   Number of children: Not on file   Years of education: Not on file   Highest  education level: Some college, no degree  Occupational History   Not on file  Tobacco Use   Smoking status: Former    Current packs/day: 0.00    Types: Cigarettes, Cigars    Quit date: 09/19/2011    Years since quitting: 12.7   Smokeless tobacco: Never  Vaping Use   Vaping status: Former  Substance and Sexual Activity   Alcohol use: Not Currently    Comment: Occasional glass of wine   Drug use: No   Sexual activity: Not Currently  Other Topics Concern   Not on file  Social History Narrative   Works in Audiological scientist at [employer].   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   Social Drivers of Corporate investment banker Strain: Low Risk  (06/17/2023)   Overall Financial Resource Strain (CARDIA)    Difficulty of Paying Living Expenses: Not very hard  Food Insecurity: No Food Insecurity (10/28/2023)   Hunger Vital Sign    Worried About Running Out of Food in the Last Year: Never true    Ran Out of Food in the Last Year: Never true  Transportation Needs: No Transportation Needs (10/28/2023)  PRAPARE - Administrator, Civil Service (Medical): No    Lack of Transportation (Non-Medical): No  Physical Activity: Sufficiently Active (06/17/2023)   Exercise Vital Sign    Days of Exercise per Week: 7 days    Minutes of Exercise per Session: 60 min  Stress: Stress Concern Present (06/17/2023)   [employer] of Occupational Health - Occupational Stress Questionnaire    Feeling of Stress : To some extent  Social Connections: Moderately Integrated (06/17/2023)   Social Connection and Isolation Panel    Frequency of Communication with Friends and Family: More than three times a week    Frequency of Social Gatherings with Friends and Family: Once a week    Attends Religious Services: More than 4 times per year    Active Member of [employer] or Organizations: Yes    Attends Banker Meetings: More than 4 times per year    Marital Status: Divorced  Intimate  Partner Violence: Not At Risk (10/28/2023)   Humiliation, Afraid, Rape, and Kick questionnaire    Fear of Current or Ex-Partner: No    Emotionally Abused: No    Physically Abused: No    Sexually Abused: No    Family History  Problem Relation Age of Onset   Cancer - Cervical Mother        dx 35s; Passed away from Kidney Issues (Not Cancer)   Heart disease Father    Kidney disease Father    Diabetes Father    Skin cancer Sister    Stomach cancer Paternal Uncle    Diabetes Maternal Grandmother    Skin cancer Maternal Grandfather    Diabetes Paternal Grandmother    Breast cancer Other        mat great aunts x2; unk age of dx   Lung cancer Cousin    Brain cancer Cousin      Current Outpatient Medications:    acetaminophen  (TYLENOL ) 500 MG tablet, Take 500 mg by mouth every 6 (six) hours as needed., Disp: , Rfl:    atenolol  (TENORMIN ) 25 MG tablet, TAKE 1 TABLET (25 MG TOTAL) BY MOUTH DAILY. FOR PALPITATIONS., Disp: 90 tablet, Rfl: 2   Biotin 1000 MCG tablet, Take 1,000 mcg by mouth daily., Disp: , Rfl:    blood glucose meter kit and supplies KIT, Dispense based on patient and insurance preference. Use up to four times daily as directed. (FOR ICD-9 250.00, 250.01)., Disp: 1 each, Rfl: 0   cyanocobalamin 500 MCG tablet, Take 1,000 mcg by mouth daily. Reported on 06/12/2016, Disp: , Rfl:    docusate sodium (COLACE) 100 MG capsule, Take 200 mg by mouth daily as needed for moderate constipation. Reported on 06/12/2016, Disp: , Rfl:    ibuprofen  (ADVIL ) 800 MG tablet, Take 1 tablet (800 mg total) by mouth every 8 (eight) hours as needed., Disp: 30 tablet, Rfl: 0   lidocaine -prilocaine  (EMLA ) cream, APPLY TO AFFECTED AREA ONCE AS DIRECTED, Disp: 30 g, Rfl: 3   metFORMIN  (GLUCOPHAGE -XR) 500 MG 24 hr tablet, TAKE 1 TABLET (500 MG TOTAL) BY MOUTH DAILY WITH BREAKFAST. FOR DIABETES., Disp: 90 tablet, Rfl: 1   pyridOXINE (VITAMIN B6) 25 MG tablet, Take 25 mg by mouth daily., Disp: , Rfl:     rosuvastatin  (CRESTOR ) 5 MG tablet, TAKE 1 TABLET (5 MG TOTAL) BY MOUTH EVERY EVENING. FOR CHOLESTEROL., Disp: 90 tablet, Rfl: 2   Semaglutide ,0.25 or 0.5MG /DOS, (OZEMPIC , 0.25 OR 0.5 MG/DOSE,) 2 MG/3ML SOPN, Inject 0.25 mg into the skin once  weekly for 4 weeks, then increase to 0.5 mg once weekly thereafter for diabetes., Disp: 9 mL, Rfl: 1   silver  sulfADIAZINE  (SILVADENE ) 1 % cream, Apply 1 Application topically 2 (two) times daily., Disp: 50 g, Rfl: 0 No current facility-administered medications for this visit.  Facility-Administered Medications Ordered in Other Visits:    0.9 %  sodium chloride  infusion, , Intravenous, Continuous, Melanee Annah BROCKS, MD, Stopped at 11/27/23 1158   0.9 %  sodium chloride  infusion, , Intravenous, Continuous, Melanee Annah BROCKS, MD, Stopped at 06/09/24 1101  Physical exam:  Vitals:   06/09/24 0853  BP: 130/65  Pulse: 86  Resp: 19  Temp: 98.6 F (37 C)  SpO2: 98%  Weight: 209 lb (94.8 kg)   Physical Exam Cardiovascular:     Rate and Rhythm: Normal rate and regular rhythm.     Heart sounds: Normal heart sounds.  Pulmonary:     Effort: Pulmonary effort is normal.     Breath sounds: Normal breath sounds.  Skin:    General: Skin is warm and dry.  Neurological:     Mental Status: She is alert and oriented to person, place, and time.      I have personally reviewed labs listed below:    Latest Ref Rng & Units 05/20/2024    8:39 AM  CMP  Glucose 70 - 99 mg/dL 840   BUN 8 - 23 mg/dL 13   Creatinine 9.55 - 1.00 mg/dL 9.23   Sodium 864 - 854 mmol/L 139   Potassium 3.5 - 5.1 mmol/L 4.0   Chloride 98 - 111 mmol/L 108   CO2 22 - 32 mmol/L 23   Calcium  8.9 - 10.3 mg/dL 8.7   Total Protein 6.5 - 8.1 g/dL 7.2   Total Bilirubin 0.0 - 1.2 mg/dL 0.5   Alkaline Phos 38 - 126 U/L 92   AST 15 - 41 U/L 59   ALT 0 - 44 U/L 40       Latest Ref Rng & Units 05/20/2024    8:39 AM  CBC  WBC 4.0 - 10.5 K/uL 3.3   Hemoglobin 12.0 - 15.0 g/dL 87.0   Hematocrit 63.9  - 46.0 % 38.9   Platelets 150 - 400 K/uL 268    I have personally reviewed Radiology images listed below: No images are attached to the encounter.  MM 3D DIAGNOSTIC MAMMOGRAM UNILATERAL LEFT BREAST Result Date: 05/11/2024 CLINICAL DATA:  Status post LEFT breast lumpectomy in November 2024 for invasive mammary carcinoma, with negative margins. Patient underwent adjuvant radiation therapy. She underwent adjuvant chemotherapy, however this will stopped due to neuropathy side effects. EXAM: DIGITAL DIAGNOSTIC UNILATERAL LEFT MAMMOGRAM WITH TOMOSYNTHESIS AND CAD TECHNIQUE: Left digital diagnostic mammography and breast tomosynthesis was performed. The images were evaluated with computer-aided detection. COMPARISON:  Previous exam(s). ACR Breast Density Category c: The breasts are heterogeneously dense, which may obscure small masses. FINDINGS: Interval density with associated architectural distortion in the retroareolar left breast, consistent with post lumpectomy changes. There is also diffuse left breast trabecular and skin thickening, consistent with post radiation changes. No suspicious calcifications are noted on the spot magnification view of the lumpectomy bed. No new suspicious mass, calcification, other findings are identified in the left breast. IMPRESSION: Interval posttreatment changes in the LEFT breast. No evidence of malignancy. RECOMMENDATION: BILATERAL diagnostic mammogram due in October 2025 to continue with post lumpectomy surveillance protocol and annual examination of the right breast. I have discussed the findings and recommendations with the patient.  If applicable, a reminder letter will be sent to the patient regarding the next appointment. BI-RADS CATEGORY  2: Benign. Electronically Signed   By: Dirk Arrant M.D.   On: 05/11/2024 11:11     Assessment and plan- Patient is a 63 y.o. female  with history of pathological prognostic stage Ia invasive mammary carcinoma of the left breast  pT1b N0 M0 grade 3 ER/PR negative and HER2 positive. She is s/p 9 weekly cycles of Abraxane  and Herceptin  which was subsequently stopped due to intolerance.  She is here for on treatment assessment prior to cycle 8 of maintenance Herceptin   No labs today.  Clinically patient is doing well and tolerating Herceptin  without any significant side effects.  She will proceed with cycle 8 today.  Echocardiogram scheduled towards the end of July.  She will be seen by covering provider in 3 weeks for cycle 9 and I will see her back in 6 weeks for cycle 10.  Chemo-induced peripheral neuropathy: Continue gabapentin    Visit Diagnosis 1. Encounter for monoclonal antibody treatment for malignancy   2. 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 in female, estrogen receptor negative (HCC)      Dr. Annah Skene, MD, MPH [facility] at [facility] [phone] 06/09/2024 11:51 AM

## 2024-07-01 ENCOUNTER — Inpatient Hospital Stay (HOSPITAL_BASED_OUTPATIENT_CLINIC_OR_DEPARTMENT_OTHER): Admitting: Nurse Practitioner

## 2024-07-01 ENCOUNTER — Inpatient Hospital Stay

## 2024-07-01 VITALS — BP 116/66 | HR 88 | Temp 99.0°F | Resp 18 | Ht 65.0 in | Wt 205.0 lb

## 2024-07-01 VITALS — BP 116/58 | HR 72 | Resp 18

## 2024-07-01 DIAGNOSIS — Z171 Estrogen receptor negative status [ER-]: Secondary | ICD-10-CM

## 2024-07-01 DIAGNOSIS — Z5112 Encounter for antineoplastic immunotherapy: Secondary | ICD-10-CM | POA: Diagnosis not present

## 2024-07-01 LAB — CMP (CANCER CENTER ONLY)
ALT: 44 U/L (ref 0–44)
AST: 58 U/L — ABNORMAL HIGH (ref 15–41)
Albumin: 3.5 g/dL (ref 3.5–5.0)
Alkaline Phosphatase: 87 U/L (ref 38–126)
Anion gap: 8 (ref 5–15)
BUN: 15 mg/dL (ref 8–23)
CO2: 23 mmol/L (ref 22–32)
Calcium: 8.8 mg/dL — ABNORMAL LOW (ref 8.9–10.3)
Chloride: 104 mmol/L (ref 98–111)
Creatinine: 0.77 mg/dL (ref 0.44–1.00)
GFR, Estimated: >60 mL/min (ref >60)
Glucose, Bld: 254 mg/dL — ABNORMAL HIGH (ref 70–99)
Potassium: 3.6 mmol/L (ref 3.5–5.1)
Sodium: 135 mmol/L (ref 135–145)
Total Bilirubin: 0.8 mg/dL (ref 0.0–1.2)
Total Protein: 6.8 g/dL (ref 6.5–8.1)

## 2024-07-01 LAB — CBC WITH DIFFERENTIAL/PLATELET
Abs Immature Granulocytes: 0 K/uL (ref 0.00–0.07)
Basophils Absolute: 0.1 K/uL (ref 0.0–0.1)
Basophils Relative: 1 %
Eosinophils Absolute: 0.2 K/uL (ref 0.0–0.5)
Eosinophils Relative: 4 %
HCT: 36.8 % (ref 36.0–46.0)
Hemoglobin: 12.6 g/dL (ref 12.0–15.0)
Immature Granulocytes: 0 %
Lymphocytes Relative: 42 %
Lymphs Abs: 1.7 K/uL (ref 0.7–4.0)
MCH: 30.6 pg (ref 26.0–34.0)
MCHC: 34.2 g/dL (ref 30.0–36.0)
MCV: 89.3 fL (ref 80.0–100.0)
Monocytes Absolute: 0.4 K/uL (ref 0.1–1.0)
Monocytes Relative: 9 %
Neutro Abs: 1.8 K/uL (ref 1.7–7.7)
Neutrophils Relative %: 44 %
Platelets: 269 K/uL (ref 150–400)
RBC: 4.12 MIL/uL (ref 3.87–5.11)
RDW: 14.3 % (ref 11.5–15.5)
WBC: 4.1 K/uL (ref 4.0–10.5)
nRBC: 0 % (ref 0.0–0.2)

## 2024-07-01 MED ORDER — TRASTUZUMAB-ANNS CHEMO 150 MG IV SOLR
6.0000 mg/kg | Freq: Once | INTRAVENOUS | Status: AC
  Administered 2024-07-01: 600 mg via INTRAVENOUS
  Filled 2024-07-01: qty 28.57

## 2024-07-01 MED ORDER — HEPARIN SOD (PORK) LOCK FLUSH 100 UNIT/ML IV SOLN
500.0000 [IU] | Freq: Once | INTRAVENOUS | Status: AC | PRN
Start: 2024-07-01 — End: 2024-07-01
  Administered 2024-07-01: 500 [IU]
  Filled 2024-07-01: qty 5

## 2024-07-01 MED ORDER — ACETAMINOPHEN 325 MG PO TABS
650.0000 mg | ORAL_TABLET | Freq: Once | ORAL | Status: AC
  Administered 2024-07-01: 650 mg via ORAL
  Filled 2024-07-01: qty 2

## 2024-07-01 MED ORDER — DIPHENHYDRAMINE HCL 25 MG PO CAPS
25.0000 mg | ORAL_CAPSULE | Freq: Once | ORAL | Status: AC
  Administered 2024-07-01: 25 mg via ORAL
  Filled 2024-07-01: qty 1

## 2024-07-01 MED ORDER — SODIUM CHLORIDE 0.9 % IV SOLN
INTRAVENOUS | Status: DC
  Filled 2024-07-01: qty 250

## 2024-07-01 NOTE — Progress Notes (Signed)
 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  Ultrasound of the left axilla demonstrated normal lymph nodes.  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  8 mm.  Tumor was ER/PR -0% and HER2 positive +3 by IHC.    Menarche at the age of 62.  She is G5, P5.  Age at first birth 18.  She has used birth control pills in the past.  She attained menopause in her 41s.  Family history significant for breast cancer in 2 sisters of her maternal grandmother.  Mom with cervical cancer in her 78s.  Paternal uncle with stomach cancer.  Her sister has been diagnosed with some form of skin cancer.   Left lumpectomy pathology from 11/04/2023 showed 10 mm grade 3 tumor with negative margins.  3 sentinel lymph nodes negative for malignancy.  Tumor was ER/PR negative and HER2 positive.  Patient had significant infusion reaction with first cycle of Taxol .  She  subsequently went on to do 9 cycles of weekly Abraxane  and Herceptin .  She had worsening neuropathy skin rash and edema with Abraxane  and was subsequently stopped.  She is currently undergoing adjuvant radiation therapy and receiving maintenance Herceptin     Interval history-tolerating treatments well so far without any skin rash or diarrhea.  Radiation dermatitis has healed  ECOG PS- 0 Pain scale- 0   Review of systems- Review of Systems  Constitutional:  Negative for chills, fever, malaise/fatigue and weight loss.  HENT:  Negative for congestion, ear discharge and nosebleeds.   Eyes:  Negative for blurred vision.  Respiratory:  Negative for cough, hemoptysis, sputum production, shortness of breath and wheezing.   Cardiovascular:  Negative for chest pain, palpitations, orthopnea and claudication.  Gastrointestinal:  Negative for abdominal pain, blood in stool, constipation, diarrhea, heartburn, melena, nausea and vomiting.  Genitourinary:  Negative for dysuria, flank pain, frequency, hematuria and urgency.  Musculoskeletal:  Negative for back pain, joint pain and myalgias.  Skin:  Negative for rash.  Neurological:  Negative for dizziness, tingling, focal weakness, seizures, weakness and headaches.  Endo/Heme/Allergies:  Does not bruise/bleed easily.  Psychiatric/Behavioral:  Negative for depression and suicidal ideas. The patient does not have insomnia.       Allergies  Allergen Reactions   Paclitaxel  Shortness Of Breath, Other (See Comments) and Hypertension    Redness to face/chest Back Pain     Flagyl [Metronidazole] Itching     Past Medical History:  Diagnosis Date   Adenoma of appendix    Anemia  Chronic lower back pain    GERD (gastroesophageal reflux disease)    H/O gestational diabetes mellitus, not currently pregnant    History of kidney stones    Hypercholesteremia    Hyperplastic rectal polyp    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,  estrogen receptor negative (HCC)    Obesity    Ovarian cyst    Palpitations    Personal history of chemotherapy    Personal history of radiation therapy    Sigmoid diverticulitis 08/2012   Subclinical hypothyroidism    SVT (supraventricular tachycardia) (HCC)    on Holter monitor 5 beat run of svt   Thyroid  nodule    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   Type 2 diabetes mellitus (HCC)      Past Surgical History:  Procedure Laterality Date   AXILLARY SENTINEL NODE BIOPSY Left 11/04/2023   Procedure: AXILLARY SENTINEL NODE BIOPSY;  Surgeon: Lane Shope, MD;  Location: [facility] ORS;  Service: General;  Laterality: Left;   BLADDER SUSPENSION  2009   BREAST BIOPSY Left 10/22/2023   Us  Core Bx, heart clip - path pending   BREAST BIOPSY Left 10/22/2023   US  LT BREAST BX W LOC DEV 1ST LESION IMG BX SPEC US  GUIDE 10/22/2023 [facility]   BREAST LUMPECTOMY WITH RADIOFREQUENCY TAG IDENTIFICATION Left 11/04/2023   Procedure: BREAST LUMPECTOMY WITH RADIOFREQUENCY TAG IDENTIFICATION;  Surgeon: Lane Shope, MD;  Location: [facility] ORS;  Service: General;  Laterality: Left;   Colectomy Left    likely sigmoid, open.  for diverticulitis.   COLONOSCOPY WITH PROPOFOL  N/A 10/01/2022   Procedure: COLONOSCOPY WITH PROPOFOL ;  Surgeon: Unk Corinn Skiff, MD;  Location: [facility]OSCOPY;  Service: Gastroenterology;  Laterality: N/A;   PORTACATH PLACEMENT Right 11/04/2023   Procedure: INSERTION PORT-A-CATH;  Surgeon: Lane Shope, MD;  Location: [facility] ORS;  Service: General;  Laterality: Right;   TUBAL LIGATION     XI ROBOTIC LAPAROSCOPIC ASSISTED APPENDECTOMY N/A 12/29/2022   Procedure: XI ROBOTIC LYSIS OF ADHESIONS AND APPENDECTOMY;  Surgeon: Lane Shope, MD;  Location: [facility] OR[facility];  Service: General;  Laterality: N/A;    Social History   Socioeconomic History   Marital status: Single    Spouse name: Not on file   Number of children: Not on file   Years of education: Not on file   Highest  education level: Some college, no degree  Occupational History   Not on file  Tobacco Use   Smoking status: Former    Current packs/day: 0.00    Types: Cigarettes, Cigars    Quit date: 09/19/2011    Years since quitting: 12.7   Smokeless tobacco: Never  Vaping Use   Vaping status: Former  Substance and Sexual Activity   Alcohol use: Not Currently    Comment: Occasional glass of wine   Drug use: No   Sexual activity: Not Currently  Other Topics Concern   Not on file  Social History Narrative   Works in Audiological scientist at [employer].   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   Social Drivers of Corporate investment banker Strain: Low Risk  (06/17/2023)   Overall Financial Resource Strain (CARDIA)    Difficulty of Paying Living Expenses: Not very hard  Food Insecurity: No Food Insecurity (10/28/2023)   Hunger Vital Sign    Worried About Running Out of Food in the Last Year: Never true    Ran Out of Food in the Last Year: Never true  Transportation Needs: No Transportation Needs (10/28/2023)  PRAPARE - Administrator, Civil Service (Medical): No    Lack of Transportation (Non-Medical): No  Physical Activity: Sufficiently Active (06/17/2023)   Exercise Vital Sign    Days of Exercise per Week: 7 days    Minutes of Exercise per Session: 60 min  Stress: Stress Concern Present (06/17/2023)   [employer] of Occupational Health - Occupational Stress Questionnaire    Feeling of Stress : To some extent  Social Connections: Moderately Integrated (06/17/2023)   Social Connection and Isolation Panel    Frequency of Communication with Friends and Family: More than three times a week    Frequency of Social Gatherings with Friends and Family: Once a week    Attends Religious Services: More than 4 times per year    Active Member of [employer] or Organizations: Yes    Attends Banker Meetings: More than 4 times per year    Marital Status: Divorced  Intimate  Partner Violence: Not At Risk (10/28/2023)   Humiliation, Afraid, Rape, and Kick questionnaire    Fear of Current or Ex-Partner: No    Emotionally Abused: No    Physically Abused: No    Sexually Abused: No    Family History  Problem Relation Age of Onset   Cancer - Cervical Mother        dx 58s; Passed away from Kidney Issues (Not Cancer)   Heart disease Father    Kidney disease Father    Diabetes Father    Skin cancer Sister    Stomach cancer Paternal Uncle    Diabetes Maternal Grandmother    Skin cancer Maternal Grandfather    Diabetes Paternal Grandmother    Breast cancer Other        mat great aunts x2; unk age of dx   Lung cancer Cousin    Brain cancer Cousin      Current Outpatient Medications:    acetaminophen  (TYLENOL ) 500 MG tablet, Take 500 mg by mouth every 6 (six) hours as needed., Disp: , Rfl:    atenolol  (TENORMIN ) 25 MG tablet, TAKE 1 TABLET (25 MG TOTAL) BY MOUTH DAILY. FOR PALPITATIONS., Disp: 90 tablet, Rfl: 2   Biotin 1000 MCG tablet, Take 1,000 mcg by mouth daily., Disp: , Rfl:    blood glucose meter kit and supplies KIT, Dispense based on patient and insurance preference. Use up to four times daily as directed. (FOR ICD-9 250.00, 250.01)., Disp: 1 each, Rfl: 0   cyanocobalamin 500 MCG tablet, Take 1,000 mcg by mouth daily. Reported on 06/12/2016, Disp: , Rfl:    docusate sodium (COLACE) 100 MG capsule, Take 200 mg by mouth daily as needed for moderate constipation. Reported on 06/12/2016, Disp: , Rfl:    ibuprofen  (ADVIL ) 800 MG tablet, Take 1 tablet (800 mg total) by mouth every 8 (eight) hours as needed., Disp: 30 tablet, Rfl: 0   lidocaine -prilocaine  (EMLA ) cream, APPLY TO AFFECTED AREA ONCE AS DIRECTED, Disp: 30 g, Rfl: 3   metFORMIN  (GLUCOPHAGE -XR) 500 MG 24 hr tablet, TAKE 1 TABLET (500 MG TOTAL) BY MOUTH DAILY WITH BREAKFAST. FOR DIABETES., Disp: 90 tablet, Rfl: 1   pyridOXINE (VITAMIN B6) 25 MG tablet, Take 25 mg by mouth daily., Disp: , Rfl:     rosuvastatin  (CRESTOR ) 5 MG tablet, TAKE 1 TABLET (5 MG TOTAL) BY MOUTH EVERY EVENING. FOR CHOLESTEROL., Disp: 90 tablet, Rfl: 2   Semaglutide ,0.25 or 0.5MG /DOS, (OZEMPIC , 0.25 OR 0.5 MG/DOSE,) 2 MG/3ML SOPN, Inject 0.25 mg into the skin once  weekly for 4 weeks, then increase to 0.5 mg once weekly thereafter for diabetes., Disp: 9 mL, Rfl: 1   silver  sulfADIAZINE  (SILVADENE ) 1 % cream, Apply 1 Application topically 2 (two) times daily., Disp: 50 g, Rfl: 0 No current facility-administered medications for this visit.  Facility-Administered Medications Ordered in Other Visits:    0.9 %  sodium chloride  infusion, , Intravenous, Continuous, Melanee Annah BROCKS, MD, Stopped at 11/27/23 1158   0.9 %  sodium chloride  infusion, , Intravenous, Continuous, Melanee Annah BROCKS, MD, Last Rate: 10 mL/hr at 07/01/24 1411, New Bag at 07/01/24 1411   heparin  lock flush 100 unit/mL, 500 Units, Intracatheter, Once PRN, Melanee Annah BROCKS, MD   trastuzumab -anns (KANJINTI ) 600 mg in sodium chloride  0.9 % 250 mL chemo infusion, 6 mg/kg (Treatment Plan Recorded), Intravenous, Once, Melanee Annah BROCKS, MD  Physical exam:  Vitals:   07/01/24 1329  BP: 116/66  Pulse: 88  Resp: 18  Temp: 99 F (37.2 C)  TempSrc: Tympanic  SpO2: 98%  Weight: 205 lb (93 kg)  Height: 5' 5 (1.651 m)   Physical Exam Cardiovascular:     Rate and Rhythm: Normal rate and regular rhythm.     Heart sounds: Normal heart sounds.  Pulmonary:     Effort: Pulmonary effort is normal.     Breath sounds: Normal breath sounds.  Skin:    General: Skin is warm and dry.  Neurological:     Mental Status: She is alert and oriented to person, place, and time.      I have personally reviewed labs listed below:    Latest Ref Rng & Units 07/01/2024    1:05 PM  CMP  Glucose 70 - 99 mg/dL 745   BUN 8 - 23 mg/dL 15   Creatinine 9.55 - 1.00 mg/dL 9.22   Sodium 864 - 854 mmol/L 135   Potassium 3.5 - 5.1 mmol/L 3.6   Chloride 98 - 111 mmol/L 104   CO2 22 - 32  mmol/L 23   Calcium  8.9 - 10.3 mg/dL 8.8   Total Protein 6.5 - 8.1 g/dL 6.8   Total Bilirubin 0.0 - 1.2 mg/dL 0.8   Alkaline Phos 38 - 126 U/L 87   AST 15 - 41 U/L 58   ALT 0 - 44 U/L 44       Latest Ref Rng & Units 07/01/2024    1:05 PM  CBC  WBC 4.0 - 10.5 K/uL 4.1   Hemoglobin 12.0 - 15.0 g/dL 87.3   Hematocrit 63.9 - 46.0 % 36.8   Platelets 150 - 400 K/uL 269    I have personally reviewed Radiology images listed below: No images are attached to the encounter.  No results found.  Assessment and plan- Patient is a 63 y.o. female  with history of pathological prognostic stage Ia invasive mammary carcinoma of the left breast pT1b N0 M0 grade 3 ER/PR negative and HER2 positive. She is s/p 9 weekly cycles of Abraxane  and Herceptin  which was subsequently stopped due to intolerance.  She is here for on treatment assessment prior to cycle 9 of maintenance Herceptin   Labs today reviwed and acceptable for treatment. She has an echo next week so we will proceed with cycle 9 today. Tolerating well without significant side effects. Follow up with Dr Melanee as scheduled.   Visit Diagnosis 1. Encounter for monoclonal antibody treatment for malignancy     Tinnie Dawn, DNP, AGNP-C, AOCNP [facility] [phone] (clinic) 07/01/2024

## 2024-07-01 NOTE — Patient Instructions (Signed)
 [facility] - A DEPT OF [facility]  Discharge Instructions: Thank you for choosing [facility] to provide your oncology and hematology care.  If you have a lab appointment with the Cancer Center, please go directly to the Cancer Center and check in at the registration area.  Wear comfortable clothing and clothing appropriate for easy access to any Portacath or PICC line.   We strive to give you quality time with your provider. You may need to reschedule your appointment if you arrive late (15 or more minutes).  Arriving late affects you and other patients whose appointments are after yours.  Also, if you miss three or more appointments without notifying the office, you may be dismissed from the clinic at the provider's discretion.      For prescription refill requests, have your pharmacy contact our office and allow 72 hours for refills to be completed.    Today you received the following chemotherapy and/or immunotherapy agents Kanjinti      To help prevent nausea and vomiting after your treatment, we encourage you to take your nausea medication as directed.  BELOW ARE SYMPTOMS THAT SHOULD BE REPORTED IMMEDIATELY: *FEVER GREATER THAN 100.4 F (38 C) OR HIGHER *CHILLS OR SWEATING *NAUSEA AND VOMITING THAT IS NOT CONTROLLED WITH YOUR NAUSEA MEDICATION *UNUSUAL SHORTNESS OF BREATH *UNUSUAL BRUISING OR BLEEDING *URINARY PROBLEMS (pain or burning when urinating, or frequent urination) *BOWEL PROBLEMS (unusual diarrhea, constipation, pain near the anus) TENDERNESS IN MOUTH AND THROAT WITH OR WITHOUT PRESENCE OF ULCERS (sore throat, sores in mouth, or a toothache) UNUSUAL RASH, SWELLING OR PAIN  UNUSUAL VAGINAL DISCHARGE OR ITCHING   Items with * indicate a potential emergency and should be followed up as soon as possible or go to the Emergency Department if any problems should occur.  Please show the CHEMOTHERAPY ALERT CARD or IMMUNOTHERAPY  ALERT CARD at check-in to the Emergency Department and triage nurse.  Should you have questions after your visit or need to cancel or reschedule your appointment, please contact [facility] - A DEPT OF [facility]  [phone] and follow the prompts.  Office hours are 8:00 a.m. to 4:30 p.m. Monday - Friday. Please note that voicemails left after 4:00 p.m. may not be returned until the following business day.  We are closed weekends and major holidays. You have access to a nurse at all times for urgent questions. Please call the main number to the clinic [phone] and follow the prompts.  For any non-urgent questions, you may also contact your provider using MyChart. We now offer e-Visits for anyone 3 and older to request care online for non-urgent symptoms. For details visit [email or web address].   Also download the MyChart app! Go to the app store, search "MyChart", open the app, select [place], and log in with your MyChart username and password.

## 2024-07-01 NOTE — Progress Notes (Signed)
 Per NP ok to proceed with treatment echo scheduled next week

## 2024-07-05 ENCOUNTER — Ambulatory Visit
Admission: RE | Admit: 2024-07-05 | Discharge: 2024-07-05 | Disposition: A | Discharge status: Home / Self Care | Source: Ambulatory Visit | Attending: Oncology

## 2024-07-05 DIAGNOSIS — Z171 Estrogen receptor negative status [ER-]: Secondary | ICD-10-CM | POA: Diagnosis not present

## 2024-07-05 DIAGNOSIS — Z0189 Encounter for other specified special examinations: Secondary | ICD-10-CM

## 2024-07-05 DIAGNOSIS — C50312 Malignant neoplasm of lower-inner quadrant of left female breast: Secondary | ICD-10-CM | POA: Insufficient documentation

## 2024-07-05 LAB — ECHOCARDIOGRAM COMPLETE
AR max vel: 2.2 cm2
AV Area VTI: 2.21 cm2
AV Area mean vel: 2.23 cm2
AV Mean grad: 4 mmHg
AV Peak grad: 7.5 mmHg
Ao pk vel: 1.37 m/s
Area-P 1/2: 3.12 cm2
Calc EF: 56.8 %
MV VTI: 1.66 cm2
S' Lateral: 2.6 cm
Single Plane A2C EF: 58.4 %
Single Plane A4C EF: 52.5 %

## 2024-07-22 ENCOUNTER — Inpatient Hospital Stay

## 2024-07-22 ENCOUNTER — Inpatient Hospital Stay: Attending: Oncology | Admitting: Oncology

## 2024-07-22 ENCOUNTER — Encounter: Payer: Self-pay | Admitting: Oncology

## 2024-07-22 VITALS — BP 120/74 | HR 87 | Temp 97.6°F | Resp 18 | Ht 65.0 in | Wt 208.7 lb

## 2024-07-22 VITALS — BP 129/71 | HR 74 | Temp 95.0°F | Resp 19

## 2024-07-22 DIAGNOSIS — G620 Drug-induced polyneuropathy: Secondary | ICD-10-CM | POA: Diagnosis not present

## 2024-07-22 DIAGNOSIS — Z1731 Human epidermal growth factor receptor 2 positive status: Secondary | ICD-10-CM | POA: Insufficient documentation

## 2024-07-22 DIAGNOSIS — T451X5A Adverse effect of antineoplastic and immunosuppressive drugs, initial encounter: Secondary | ICD-10-CM

## 2024-07-22 DIAGNOSIS — Z5112 Encounter for antineoplastic immunotherapy: Secondary | ICD-10-CM | POA: Diagnosis present

## 2024-07-22 DIAGNOSIS — R7989 Other specified abnormal findings of blood chemistry: Secondary | ICD-10-CM

## 2024-07-22 DIAGNOSIS — Z1722 Progesterone receptor negative status: Secondary | ICD-10-CM | POA: Diagnosis not present

## 2024-07-22 DIAGNOSIS — C50312 Malignant neoplasm of lower-inner quadrant of left female breast: Secondary | ICD-10-CM | POA: Diagnosis present

## 2024-07-22 DIAGNOSIS — Z171 Estrogen receptor negative status [ER-]: Secondary | ICD-10-CM | POA: Diagnosis not present

## 2024-07-22 MED ORDER — ACETAMINOPHEN 325 MG PO TABS
650.0000 mg | ORAL_TABLET | Freq: Once | ORAL | Status: AC
  Administered 2024-07-22: 650 mg via ORAL
  Filled 2024-07-22: qty 2

## 2024-07-22 MED ORDER — TRASTUZUMAB-ANNS CHEMO 150 MG IV SOLR
6.0000 mg/kg | Freq: Once | INTRAVENOUS | Status: AC
  Administered 2024-07-22: 600 mg via INTRAVENOUS
  Filled 2024-07-22: qty 28.57

## 2024-07-22 MED ORDER — DIPHENHYDRAMINE HCL 25 MG PO CAPS
25.0000 mg | ORAL_CAPSULE | Freq: Once | ORAL | Status: AC
  Administered 2024-07-22: 25 mg via ORAL
  Filled 2024-07-22: qty 1

## 2024-07-22 MED ORDER — SODIUM CHLORIDE 0.9 % IV SOLN
INTRAVENOUS | Status: DC
Start: 2024-07-22 — End: 2024-07-22
  Filled 2024-07-22: qty 250

## 2024-07-22 NOTE — Patient Instructions (Signed)
 [facility] - A DEPT OF [facility]  Discharge Instructions: Thank you for choosing [facility] to provide your oncology and hematology care.  If you have a lab appointment with the Cancer Center, please go directly to the Cancer Center and check in at the registration area.  Wear comfortable clothing and clothing appropriate for easy access to any Portacath or PICC line.   We strive to give you quality time with your provider. You may need to reschedule your appointment if you arrive late (15 or more minutes).  Arriving late affects you and other patients whose appointments are after yours.  Also, if you miss three or more appointments without notifying the office, you may be dismissed from the clinic at the provider's discretion.      For prescription refill requests, have your pharmacy contact our office and allow 72 hours for refills to be completed.    Today you received the following chemotherapy and/or immunotherapy agents kanjinti      To help prevent nausea and vomiting after your treatment, we encourage you to take your nausea medication as directed.  BELOW ARE SYMPTOMS THAT SHOULD BE REPORTED IMMEDIATELY: *FEVER GREATER THAN 100.4 F (38 C) OR HIGHER *CHILLS OR SWEATING *NAUSEA AND VOMITING THAT IS NOT CONTROLLED WITH YOUR NAUSEA MEDICATION *UNUSUAL SHORTNESS OF BREATH *UNUSUAL BRUISING OR BLEEDING *URINARY PROBLEMS (pain or burning when urinating, or frequent urination) *BOWEL PROBLEMS (unusual diarrhea, constipation, pain near the anus) TENDERNESS IN MOUTH AND THROAT WITH OR WITHOUT PRESENCE OF ULCERS (sore throat, sores in mouth, or a toothache) UNUSUAL RASH, SWELLING OR PAIN  UNUSUAL VAGINAL DISCHARGE OR ITCHING   Items with * indicate a potential emergency and should be followed up as soon as possible or go to the Emergency Department if any problems should occur.  Please show the CHEMOTHERAPY ALERT CARD or IMMUNOTHERAPY  ALERT CARD at check-in to the Emergency Department and triage nurse.  Should you have questions after your visit or need to cancel or reschedule your appointment, please contact [facility] - A DEPT OF [facility]  [phone] and follow the prompts.  Office hours are 8:00 a.m. to 4:30 p.m. Monday - Friday. Please note that voicemails left after 4:00 p.m. may not be returned until the following business day.  We are closed weekends and major holidays. You have access to a nurse at all times for urgent questions. Please call the main number to the clinic [phone] and follow the prompts.  For any non-urgent questions, you may also contact your provider using MyChart. We now offer e-Visits for anyone 73 and older to request care online for non-urgent symptoms. For details visit [email or web address].   Also download the MyChart app! Go to the app store, search "MyChart", open the app, select [place], and log in with your MyChart username and password.

## 2024-07-22 NOTE — Progress Notes (Signed)
 Patient had an Echo on 07/05/2024. She is doing good, no new questions for the doctor today.

## 2024-07-22 NOTE — Progress Notes (Signed)
 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  Ultrasound of the left axilla demonstrated normal lymph nodes.  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  8 mm.  Tumor was ER/PR -0% and HER2 positive +3 by IHC.    Menarche at the age of 29.  She is G5, P5.  Age at first birth 26.  She has used birth control pills in the past.  She attained menopause in her 64s.  Family history significant for breast cancer in 2 sisters of her maternal grandmother.  Mom with cervical cancer in her 74s.  Paternal uncle with stomach cancer.  Her sister has been diagnosed with some form of skin cancer.   Left lumpectomy pathology from 11/04/2023 showed 10 mm grade 3 tumor with negative margins.  3 sentinel lymph nodes negative for malignancy.  Tumor was ER/PR negative and HER2 positive.  Patient had significant infusion reaction with first cycle of Taxol .  She  subsequently went on to do 9 cycles of weekly Abraxane  and Herceptin .  She had worsening neuropathy skin rash and edema with Abraxane  and was subsequently stopped.  She is currently undergoing adjuvant radiation therapy and receiving maintenance Herceptin     Interval history-patient has some residual neuropathy from her prior chemotherapy.  She would like to try alpha lipoid acid for the same.  Denies other complaints at this time  ECOG PS- 0 Pain scale- 0   Review of systems- Review of Systems  Constitutional:  Negative for chills, fever, malaise/fatigue and weight loss.  HENT:  Negative for congestion, ear discharge and nosebleeds.   Eyes:  Negative for blurred vision.  Respiratory:  Negative for cough, hemoptysis, sputum production, shortness of breath and wheezing.   Cardiovascular:  Negative for chest pain, palpitations, orthopnea and claudication.  Gastrointestinal:  Negative for abdominal pain, blood in stool, constipation, diarrhea, heartburn, melena, nausea and vomiting.  Genitourinary:  Negative for dysuria, flank pain, frequency, hematuria and urgency.  Musculoskeletal:  Negative for back pain, joint pain and myalgias.  Skin:  Negative for rash.  Neurological:  Positive for sensory change (Peripheral neuropathy). Negative for dizziness, tingling, focal weakness, seizures, weakness and headaches.  Endo/Heme/Allergies:  Does not bruise/bleed easily.  Psychiatric/Behavioral:  Negative for depression and suicidal ideas. The patient does not have insomnia.       Allergies  Allergen Reactions   Paclitaxel  Shortness Of Breath, Other (See Comments) and Hypertension    Redness to face/chest Back Pain     Flagyl [Metronidazole] Itching     Past  Medical History:  Diagnosis Date   Adenoma of appendix    Anemia    Chronic lower back pain    GERD (gastroesophageal reflux disease)    H/O gestational diabetes mellitus, not currently pregnant    History of kidney stones     Hypercholesteremia    Hyperplastic rectal polyp    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, estrogen receptor negative (HCC)    Obesity    Ovarian cyst    Palpitations    Personal history of chemotherapy    Personal history of radiation therapy    Sigmoid diverticulitis 08/2012   Subclinical hypothyroidism    SVT (supraventricular tachycardia) (HCC)    on Holter monitor 5 beat run of svt   Thyroid  nodule    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   Type 2 diabetes mellitus (HCC)      Past Surgical History:  Procedure Laterality Date   AXILLARY SENTINEL NODE BIOPSY Left 11/04/2023   Procedure: AXILLARY SENTINEL NODE BIOPSY;  Surgeon: Lane Shope, MD;  Location: [facility] ORS;  Service: General;  Laterality: Left;   BLADDER SUSPENSION  2009   BREAST BIOPSY Left 10/22/2023   Us  Core Bx, heart clip - path pending   BREAST BIOPSY Left 10/22/2023   US  LT BREAST BX W LOC DEV 1ST LESION IMG BX SPEC US  GUIDE 10/22/2023 [facility]   BREAST LUMPECTOMY WITH RADIOFREQUENCY TAG IDENTIFICATION Left 11/04/2023   Procedure: BREAST LUMPECTOMY WITH RADIOFREQUENCY TAG IDENTIFICATION;  Surgeon: Lane Shope, MD;  Location: [facility] ORS;  Service: General;  Laterality: Left;   Colectomy Left    likely sigmoid, open.  for diverticulitis.   COLONOSCOPY WITH PROPOFOL  N/A 10/01/2022   Procedure: COLONOSCOPY WITH PROPOFOL ;  Surgeon: Unk Corinn Skiff, MD;  Location: [facility];  Service: Gastroenterology;  Laterality: N/A;   PORTACATH PLACEMENT Right 11/04/2023   Procedure: INSERTION PORT-A-CATH;  Surgeon: Lane Shope, MD;  Location: [facility] ORS;  Service: General;  Laterality: Right;   TUBAL LIGATION     XI ROBOTIC LAPAROSCOPIC ASSISTED APPENDECTOMY N/A 12/29/2022   Procedure: XI ROBOTIC LYSIS OF ADHESIONS AND APPENDECTOMY;  Surgeon: Lane Shope, MD;  Location: [facility] ORS;  Service: General;  Laterality: N/A;    Social History   Socioeconomic History   Marital status: Single     Spouse name: Not on file   Number of children: Not on file   Years of education: Not on file   Highest education level: Some college, no degree  Occupational History   Not on file  Tobacco Use   Smoking status: Former    Current packs/day: 0.00    Types: Cigarettes, Cigars    Quit date: 09/19/2011    Years since quitting: 12.8   Smokeless tobacco: Never  Vaping Use   Vaping status: Former  Substance and Sexual Activity   Alcohol use: Not Currently    Comment: Occasional glass of wine   Drug use: No   Sexual activity: Not Currently  Other Topics Concern   Not on file  Social History Narrative   Works in Audiological scientist at [employer].   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   Social Drivers of Corporate investment banker Strain: Low Risk  (06/17/2023)   Overall Financial Resource Strain (CARDIA)    Difficulty of Paying Living Expenses: Not very hard  Food Insecurity: No Food Insecurity (10/28/2023)   Hunger Vital Sign    Worried About Running Out of Food in the Last Year: Never true  Ran Out of Food in the Last Year: Never true  Transportation Needs: No Transportation Needs (10/28/2023)   PRAPARE - Administrator, Civil Service (Medical): No    Lack of Transportation (Non-Medical): No  Physical Activity: Sufficiently Active (06/17/2023)   Exercise Vital Sign    Days of Exercise per Week: 7 days    Minutes of Exercise per Session: 60 min  Stress: Stress Concern Present (06/17/2023)   [employer] of Occupational Health - Occupational Stress Questionnaire    Feeling of Stress : To some extent  Social Connections: Moderately Integrated (06/17/2023)   Social Connection and Isolation Panel    Frequency of Communication with Friends and Family: More than three times a week    Frequency of Social Gatherings with Friends and Family: Once a week    Attends Religious Services: More than 4 times per year    Active Member of [employer] or Organizations: Yes     Attends Banker Meetings: More than 4 times per year    Marital Status: Divorced  Intimate Partner Violence: Not At Risk (10/28/2023)   Humiliation, Afraid, Rape, and Kick questionnaire    Fear of Current or Ex-Partner: No    Emotionally Abused: No    Physically Abused: No    Sexually Abused: No    Family History  Problem Relation Age of Onset   Cancer - Cervical Mother        dx 38s; Passed away from Kidney Issues (Not Cancer)   Heart disease Father    Kidney disease Father    Diabetes Father    Skin cancer Sister    Stomach cancer Paternal Uncle    Diabetes Maternal Grandmother    Skin cancer Maternal Grandfather    Diabetes Paternal Grandmother    Breast cancer Other        mat great aunts x2; unk age of dx   Lung cancer Cousin    Brain cancer Cousin      Current Outpatient Medications:    acetaminophen  (TYLENOL ) 500 MG tablet, Take 500 mg by mouth every 6 (six) hours as needed., Disp: , Rfl:    atenolol  (TENORMIN ) 25 MG tablet, TAKE 1 TABLET (25 MG TOTAL) BY MOUTH DAILY. FOR PALPITATIONS., Disp: 90 tablet, Rfl: 2   Biotin 1000 MCG tablet, Take 1,000 mcg by mouth daily., Disp: , Rfl:    blood glucose meter kit and supplies KIT, Dispense based on patient and insurance preference. Use up to four times daily as directed. (FOR ICD-9 250.00, 250.01)., Disp: 1 each, Rfl: 0   cyanocobalamin 500 MCG tablet, Take 1,000 mcg by mouth daily. Reported on 06/12/2016, Disp: , Rfl:    docusate sodium (COLACE) 100 MG capsule, Take 200 mg by mouth daily as needed for moderate constipation. Reported on 06/12/2016, Disp: , Rfl:    ibuprofen  (ADVIL ) 800 MG tablet, Take 1 tablet (800 mg total) by mouth every 8 (eight) hours as needed., Disp: 30 tablet, Rfl: 0   lidocaine -prilocaine  (EMLA ) cream, APPLY TO AFFECTED AREA ONCE AS DIRECTED, Disp: 30 g, Rfl: 3   metFORMIN  (GLUCOPHAGE -XR) 500 MG 24 hr tablet, TAKE 1 TABLET (500 MG TOTAL) BY MOUTH DAILY WITH BREAKFAST. FOR DIABETES., Disp: 90  tablet, Rfl: 1   pyridOXINE (VITAMIN B6) 25 MG tablet, Take 25 mg by mouth daily., Disp: , Rfl:    rosuvastatin  (CRESTOR ) 5 MG tablet, TAKE 1 TABLET (5 MG TOTAL) BY MOUTH EVERY EVENING. FOR CHOLESTEROL., Disp: 90 tablet, Rfl: 2  Semaglutide ,0.25 or 0.5MG /DOS, (OZEMPIC , 0.25 OR 0.5 MG/DOSE,) 2 MG/3ML SOPN, Inject 0.25 mg into the skin once weekly for 4 weeks, then increase to 0.5 mg once weekly thereafter for diabetes., Disp: 9 mL, Rfl: 1   silver  sulfADIAZINE  (SILVADENE ) 1 % cream, Apply 1 Application topically 2 (two) times daily., Disp: 50 g, Rfl: 0 No current facility-administered medications for this visit.  Facility-Administered Medications Ordered in Other Visits:    0.9 %  sodium chloride  infusion, , Intravenous, Continuous, Melanee Annah BROCKS, MD, Stopped at 11/27/23 1158  Physical exam:  Vitals:   07/22/24 0906  BP: 120/74  Pulse: 87  Resp: 18  Temp: 97.6 F (36.4 C)  TempSrc: Tympanic  SpO2: 100%  Weight: 208 lb 11.2 oz (94.7 kg)  Height: 5' 5 (1.651 m)   Physical Exam Cardiovascular:     Rate and Rhythm: Normal rate and regular rhythm.     Heart sounds: Normal heart sounds.  Pulmonary:     Effort: Pulmonary effort is normal.     Breath sounds: Normal breath sounds.  Abdominal:     General: Bowel sounds are normal.     Palpations: Abdomen is soft.  Skin:    General: Skin is warm and dry.  Neurological:     Mental Status: She is alert and oriented to person, place, and time.      I have personally reviewed labs listed below:    Latest Ref Rng & Units 07/01/2024    1:05 PM  CMP  Glucose 70 - 99 mg/dL 745   BUN 8 - 23 mg/dL 15   Creatinine 9.55 - 1.00 mg/dL 9.22   Sodium 864 - 854 mmol/L 135   Potassium 3.5 - 5.1 mmol/L 3.6   Chloride 98 - 111 mmol/L 104   CO2 22 - 32 mmol/L 23   Calcium  8.9 - 10.3 mg/dL 8.8   Total Protein 6.5 - 8.1 g/dL 6.8   Total Bilirubin 0.0 - 1.2 mg/dL 0.8   Alkaline Phos 38 - 126 U/L 87   AST 15 - 41 U/L 58   ALT 0 - 44 U/L 44        Latest Ref Rng & Units 07/01/2024    1:05 PM  CBC  WBC 4.0 - 10.5 K/uL 4.1   Hemoglobin 12.0 - 15.0 g/dL 87.3   Hematocrit 63.9 - 46.0 % 36.8   Platelets 150 - 400 K/uL 269    I have personally reviewed Radiology images listed below: No images are attached to the encounter.  ECHOCARDIOGRAM COMPLETE Result Date: 07/05/2024    ECHOCARDIOGRAM REPORT   Patient Name:   Rebecca Macias Date of Exam: 07/05/2024 Medical Rec #:  [id number]       Height:       65.0 in Accession #:    [id number]      Weight:       205.0 lb Date of Birth:  Jun 14, 1961       BSA:          1.999 m Patient Age:    62 years        BP:           116/58 mmHg Patient Gender: F               HR:           79 bpm. Exam Location:  [facility] Procedure: 2D Echo, Cardiac Doppler, Color Doppler and Strain Analysis (Both  Spectral and Color Flow Doppler were utilized during procedure). Indications:     Malignant neoplasm of lower-inner quadrant of left female                  breast [facility]) [[id number]]  History:         Patient has prior history of Echocardiogram examinations, most                  recent 02/16/2024. History of Chemotherapy.  Sonographer:     Rosina Macias Referring Phys:  [id number] ANNAH BROCKS Ehsan Corvin Diagnosing Phys: Deatrice Cage MD  Sonographer Comments: Global longitudinal strain was attempted. IMPRESSIONS  1. Left ventricular ejection fraction, by estimation, is 55 to 60%. The left ventricle has normal function. The left ventricle has no regional wall motion abnormalities. Left ventricular diastolic parameters were normal. The average left ventricular global longitudinal strain is -17.0 %. The global longitudinal strain is normal.  2. Right ventricular systolic function is normal. The right ventricular size is normal. Tricuspid regurgitation signal is inadequate for assessing PA pressure.  3. The mitral valve is normal in structure. Trivial mitral valve regurgitation. No evidence of mitral stenosis.  4. The aortic valve  is normal in structure. Aortic valve regurgitation is not visualized. No aortic stenosis is present. FINDINGS  Left Ventricle: Left ventricular ejection fraction, by estimation, is 55 to 60%. The left ventricle has normal function. The left ventricle has no regional wall motion abnormalities. The average left ventricular global longitudinal strain is -17.0 %. Strain was performed and the global longitudinal strain is normal. The left ventricular internal cavity size was normal in size. There is no left ventricular hypertrophy. Left ventricular diastolic parameters were normal. Right Ventricle: The right ventricular size is normal. No increase in right ventricular wall thickness. Right ventricular systolic function is normal. Tricuspid regurgitation signal is inadequate for assessing PA pressure. Left Atrium: Left atrial size was normal in size. Right Atrium: Right atrial size was normal in size. Pericardium: There is no evidence of pericardial effusion. Mitral Valve: The mitral valve is normal in structure. Mild to moderate mitral annular calcification. Trivial mitral valve regurgitation. No evidence of mitral valve stenosis. MV peak gradient, 5.5 mmHg. The mean mitral valve gradient is 4.0 mmHg. Tricuspid Valve: The tricuspid valve is normal in structure. Tricuspid valve regurgitation is trivial. No evidence of tricuspid stenosis. Aortic Valve: The aortic valve is normal in structure. Aortic valve regurgitation is not visualized. No aortic stenosis is present. Aortic valve mean gradient measures 4.0 mmHg. Aortic valve peak gradient measures 7.5 mmHg. Aortic valve area, by VTI measures 2.21 cm. Pulmonic Valve: The pulmonic valve was normal in structure. Pulmonic valve regurgitation is not visualized. No evidence of pulmonic stenosis. Aorta: The aortic root is normal in size and structure. Venous: The inferior vena cava was not well visualized. IAS/Shunts: No atrial level shunt detected by color flow Doppler.  LEFT  VENTRICLE PLAX 2D LVIDd:         4.80 cm     Diastology LVIDs:         2.60 cm     LV e' medial:    8.05 cm/s LV PW:         0.90 cm     LV E/e' medial:  15.3 LV IVS:        0.70 cm     LV e' lateral:   9.90 cm/s LVOT diam:     2.00 cm     LV E/e' lateral:  12.4 LV SV:         62 LV SV Index:   31          2D Longitudinal Strain LVOT Area:     3.14 cm    2D Strain GLS Avg:     -17.0 %  LV Volumes (MOD) LV vol d, MOD A2C: 85.0 ml LV vol d, MOD A4C: 87.3 ml LV vol s, MOD A2C: 35.4 ml LV vol s, MOD A4C: 41.5 ml LV SV MOD A2C:     49.6 ml LV SV MOD A4C:     87.3 ml LV SV MOD BP:      49.5 ml RIGHT VENTRICLE RV Basal diam:  4.00 cm RV Mid diam:    3.10 cm RV S prime:     11.60 cm/s TAPSE (M-mode): 2.6 cm LEFT ATRIUM             Index        RIGHT ATRIUM           Index LA diam:        4.40 cm 2.20 cm/m   RA Area:     13.20 cm LA Vol (A2C):   51.4 ml 25.71 ml/m  RA Volume:   28.60 ml  14.31 ml/m LA Vol (A4C):   29.9 ml 14.96 ml/m LA Biplane Vol: 39.7 ml 19.86 ml/m  AORTIC VALVE                    PULMONIC VALVE AV Area (Vmax):    2.20 cm     PV Vmax:        1.16 m/s AV Area (Vmean):   2.23 cm     PV Vmean:       83.400 cm/s AV Area (VTI):     2.21 cm     PV VTI:         0.250 m AV Vmax:           137.00 cm/s  PV Peak grad:   5.4 mmHg AV Vmean:          91.400 cm/s  PV Mean grad:   3.0 mmHg AV VTI:            0.282 m      RVOT Peak grad: 3 mmHg AV Peak Grad:      7.5 mmHg AV Mean Grad:      4.0 mmHg LVOT Vmax:         95.90 cm/s LVOT Vmean:        65.000 cm/s LVOT VTI:          0.198 m LVOT/AV VTI ratio: 0.70  AORTA Ao Root diam: 2.90 cm Ao Asc diam:  2.90 cm MITRAL VALVE MV Area (PHT): 3.12 cm     SHUNTS MV Area VTI:   1.66 cm     Systemic VTI:  0.20 m MV Peak grad:  5.5 mmHg     Systemic Diam: 2.00 cm MV Mean grad:  4.0 mmHg     Pulmonic VTI:  0.151 m MV Vmax:       1.17 m/s MV Vmean:      92.9 cm/s MV Decel Time: 243 msec MV E velocity: 123.00 cm/s MV A velocity: 115.00 cm/s MV E/A ratio:  1.07 Deatrice Cage  MD Electronically signed by Deatrice Cage MD Signature Date/Time: 07/05/2024/2:37:15 PM    Final      Assessment and plan- Patient is a 63 y.o. 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 She is s/p 9 weekly cycles of Abraxane  and Herceptin  which was subsequently stopped due to intolerance.  She is here for on treatment assessment prior to cycle 10 of maintenance Herceptin   Counts okay to Proceed with cycle 10 of maintenance Herceptin  today.  I will see her back in 3 weeks for cycle 11.  She completes 1 year of treatment in December 2025.  I also reviewed the results of her recent echocardiogram from 07/05/2024 which shows a normal EF of 55 to 60% with no regional wall motion abnormalities.  Patient would be due for her mammogram in October 2025 which we will schedule  Patient gets labs every 6 weeks and I did review her labs from 3 weeks ago which showed blood sugar of 254 and I will encouraged her to monitor her blood sugars closely and talk to her primary care doctor about managing her diabetes.  AST is mildly elevated at 58 and has been stable around this range for the last 4 months.  Continue to monitor  Chemo-induced peripheral neuropathy: There are no scientific studies to prove that alpha lipoid acid helps with neuropathy.  There are some small studies which have indicated some use in mild to moderate cases.  Patient can try it and see if it helps in the next few weeks   Visit Diagnosis 1. 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 in female, estrogen receptor negative (HCC)   2. Encounter for monoclonal antibody treatment for malignancy   3. Abnormal LFTs   4. Chemotherapy-induced peripheral neuropathy (HCC)      Dr. Annah Skene, MD, MPH [facility] at [facility] [phone] 07/22/2024 9:25 AM

## 2024-08-05 ENCOUNTER — Other Ambulatory Visit: Payer: Self-pay | Admitting: Primary Care

## 2024-08-05 DIAGNOSIS — R002 Palpitations: Secondary | ICD-10-CM

## 2024-08-05 DIAGNOSIS — E785 Hyperlipidemia, unspecified: Secondary | ICD-10-CM

## 2024-08-12 ENCOUNTER — Inpatient Hospital Stay (HOSPITAL_BASED_OUTPATIENT_CLINIC_OR_DEPARTMENT_OTHER): Admitting: Oncology

## 2024-08-12 ENCOUNTER — Encounter: Payer: Self-pay | Admitting: Oncology

## 2024-08-12 ENCOUNTER — Inpatient Hospital Stay

## 2024-08-12 ENCOUNTER — Inpatient Hospital Stay: Attending: Oncology

## 2024-08-12 VITALS — BP 105/58 | HR 65

## 2024-08-12 VITALS — BP 124/55 | HR 72 | Temp 97.1°F | Resp 20 | Ht 65.0 in | Wt 209.1 lb

## 2024-08-12 DIAGNOSIS — Z1731 Human epidermal growth factor receptor 2 positive status: Secondary | ICD-10-CM | POA: Diagnosis not present

## 2024-08-12 DIAGNOSIS — Z1722 Progesterone receptor negative status: Secondary | ICD-10-CM

## 2024-08-12 DIAGNOSIS — Z171 Estrogen receptor negative status [ER-]: Secondary | ICD-10-CM | POA: Insufficient documentation

## 2024-08-12 DIAGNOSIS — C50312 Malignant neoplasm of lower-inner quadrant of left female breast: Secondary | ICD-10-CM | POA: Insufficient documentation

## 2024-08-12 DIAGNOSIS — Z79899 Other long term (current) drug therapy: Secondary | ICD-10-CM | POA: Diagnosis not present

## 2024-08-12 DIAGNOSIS — Z5112 Encounter for antineoplastic immunotherapy: Secondary | ICD-10-CM | POA: Diagnosis present

## 2024-08-12 DIAGNOSIS — Z1732 Human epidermal growth factor receptor 2 negative status: Secondary | ICD-10-CM

## 2024-08-12 LAB — CMP (CANCER CENTER ONLY)
ALT: 25 U/L (ref 0–44)
AST: 36 U/L (ref 15–41)
Albumin: 3.6 g/dL (ref 3.5–5.0)
Alkaline Phosphatase: 84 U/L (ref 38–126)
Anion gap: 8 (ref 5–15)
BUN: 15 mg/dL (ref 8–23)
CO2: 24 mmol/L (ref 22–32)
Calcium: 9.1 mg/dL (ref 8.9–10.3)
Chloride: 106 mmol/L (ref 98–111)
Creatinine: 0.75 mg/dL (ref 0.44–1.00)
GFR, Estimated: >60 mL/min (ref >60)
Glucose, Bld: 232 mg/dL — ABNORMAL HIGH (ref 70–99)
Potassium: 4.2 mmol/L (ref 3.5–5.1)
Sodium: 138 mmol/L (ref 135–145)
Total Bilirubin: 0.7 mg/dL (ref 0.0–1.2)
Total Protein: 7 g/dL (ref 6.5–8.1)

## 2024-08-12 LAB — CBC WITH DIFFERENTIAL/PLATELET
Abs Immature Granulocytes: 0.01 K/uL (ref 0.00–0.07)
Basophils Absolute: 0 K/uL (ref 0.0–0.1)
Basophils Relative: 1 %
Eosinophils Absolute: 0.2 K/uL (ref 0.0–0.5)
Eosinophils Relative: 6 %
HCT: 39.2 % (ref 36.0–46.0)
Hemoglobin: 13.1 g/dL (ref 12.0–15.0)
Immature Granulocytes: 0 %
Lymphocytes Relative: 40 %
Lymphs Abs: 1.5 K/uL (ref 0.7–4.0)
MCH: 30.4 pg (ref 26.0–34.0)
MCHC: 33.4 g/dL (ref 30.0–36.0)
MCV: 91 fL (ref 80.0–100.0)
Monocytes Absolute: 0.3 K/uL (ref 0.1–1.0)
Monocytes Relative: 9 %
Neutro Abs: 1.7 K/uL (ref 1.7–7.7)
Neutrophils Relative %: 44 %
Platelets: 259 K/uL (ref 150–400)
RBC: 4.31 MIL/uL (ref 3.87–5.11)
RDW: 13.1 % (ref 11.5–15.5)
WBC: 3.8 K/uL — ABNORMAL LOW (ref 4.0–10.5)
nRBC: 0 % (ref 0.0–0.2)

## 2024-08-12 MED ORDER — TRASTUZUMAB-ANNS CHEMO 150 MG IV SOLR
6.0000 mg/kg | Freq: Once | INTRAVENOUS | Status: AC
  Administered 2024-08-12: 600 mg via INTRAVENOUS
  Filled 2024-08-12: qty 28.57

## 2024-08-12 MED ORDER — ALTEPLASE 2 MG IJ SOLR
2.0000 mg | Freq: Once | INTRAMUSCULAR | Status: AC | PRN
  Administered 2024-08-12: 2 mg
  Filled 2024-08-12: qty 2

## 2024-08-12 MED ORDER — DIPHENHYDRAMINE HCL 25 MG PO CAPS
25.0000 mg | ORAL_CAPSULE | Freq: Once | ORAL | Status: AC
  Administered 2024-08-12: 25 mg via ORAL
  Filled 2024-08-12: qty 1

## 2024-08-12 MED ORDER — ACETAMINOPHEN 325 MG PO TABS
650.0000 mg | ORAL_TABLET | Freq: Once | ORAL | Status: AC
  Administered 2024-08-12: 650 mg via ORAL
  Filled 2024-08-12: qty 2

## 2024-08-12 MED ORDER — SODIUM CHLORIDE 0.9 % IV SOLN
INTRAVENOUS | Status: DC
Start: 2024-08-12 — End: 2024-08-12
  Filled 2024-08-12: qty 250

## 2024-08-12 NOTE — Progress Notes (Signed)
 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  Ultrasound of the left axilla demonstrated normal lymph nodes.  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  8 mm.  Tumor was ER/PR -0% and HER2 positive +3 by IHC.    Menarche at the age of 39.  She is G5, P5.  Age at first birth 89.  She has used birth control pills in the past.  She attained menopause in her 33s.  Family history significant for breast cancer in 2 sisters of her maternal grandmother.  Mom with cervical cancer in her 29s.  Paternal uncle with stomach cancer.  Her sister has been diagnosed with some form of skin cancer.   Left lumpectomy pathology from 11/04/2023 showed 10 mm grade 3 tumor with negative margins.  3 sentinel lymph nodes negative for malignancy.  Tumor was ER/PR negative and HER2 positive.  Patient had significant infusion reaction with first cycle of Taxol .  She  subsequently went on to do 9 cycles of weekly Abraxane  and Herceptin .  She had worsening neuropathy skin rash and edema with Abraxane  and was subsequently stopped.  She is currently undergoing adjuvant radiation therapy and receiving maintenance Herceptin     Interval history-tolerating treatments well so far and denies any complaints today  ECOG PS- 0 Pain scale- 0   Review of systems- Review of Systems  Constitutional:  Negative for chills, fever, malaise/fatigue and weight loss.  HENT:  Negative for congestion, ear discharge and nosebleeds.   Eyes:  Negative for blurred vision.  Respiratory:  Negative for cough, hemoptysis, sputum production, shortness of breath and wheezing.   Cardiovascular:  Negative for chest pain, palpitations, orthopnea and claudication.  Gastrointestinal:  Negative for abdominal pain, blood in stool, constipation, diarrhea, heartburn, melena, nausea and vomiting.  Genitourinary:  Negative for dysuria, flank pain, frequency, hematuria and urgency.  Musculoskeletal:  Negative for back pain, joint pain and myalgias.  Skin:  Negative for rash.  Neurological:  Negative for dizziness, tingling, focal weakness, seizures, weakness and headaches.  Endo/Heme/Allergies:  Does not bruise/bleed easily.  Psychiatric/Behavioral:  Negative for depression and suicidal ideas. The patient does not have insomnia.       Allergies  Allergen Reactions   Paclitaxel  Shortness Of Breath, Other (See Comments) and Hypertension    Redness to face/chest Back Pain     Flagyl [Metronidazole] Itching     Past Medical History:  Diagnosis Date   Adenoma of appendix    Anemia    Chronic lower back pain  GERD (gastroesophageal reflux disease)    H/O gestational diabetes mellitus, not currently pregnant    History of kidney stones    Hypercholesteremia    Hyperplastic rectal polyp    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, estrogen receptor negative (HCC)     Obesity    Ovarian cyst    Palpitations    Personal history of chemotherapy    Personal history of radiation therapy    Sigmoid diverticulitis 08/2012   Subclinical hypothyroidism    SVT (supraventricular tachycardia) (HCC)    on Holter monitor 5 beat run of svt   Thyroid  nodule    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   Type 2 diabetes mellitus (HCC)      Past Surgical History:  Procedure Laterality Date   AXILLARY SENTINEL NODE BIOPSY Left 11/04/2023   Procedure: AXILLARY SENTINEL NODE BIOPSY;  Surgeon: Lane Shope, MD;  Location: [facility] ORS;  Service: General;  Laterality: Left;   BLADDER SUSPENSION  2009   BREAST BIOPSY Left 10/22/2023   Us  Core Bx, heart clip - path pending   BREAST BIOPSY Left 10/22/2023   US  LT BREAST BX W LOC DEV 1ST LESION IMG BX SPEC US  GUIDE 10/22/2023 [facility]   BREAST LUMPECTOMY WITH RADIOFREQUENCY TAG IDENTIFICATION Left 11/04/2023   Procedure: BREAST LUMPECTOMY WITH RADIOFREQUENCY TAG IDENTIFICATION;  Surgeon: Lane Shope, MD;  Location: [facility] OR[facility];  Service: General;  Laterality: Left;   Colectomy Left    likely sigmoid, open.  for diverticulitis.   COLONOSCOPY WITH PROPOFOL  N/A 10/01/2022   Procedure: COLONOSCOPY WITH PROPOFOL ;  Surgeon: Unk Corinn Skiff, MD;  Location: [facility]OSCOPY;  Service: Gastroenterology;  Laterality: N/A;   PORTACATH PLACEMENT Right 11/04/2023   Procedure: INSERTION PORT-A-CATH;  Surgeon: Lane Shope, MD;  Location: [facility] ORS;  Service: General;  Laterality: Right;   TUBAL LIGATION     XI ROBOTIC LAPAROSCOPIC ASSISTED APPENDECTOMY N/A 12/29/2022   Procedure: XI ROBOTIC LYSIS OF ADHESIONS AND APPENDECTOMY;  Surgeon: Lane Shope, MD;  Location: [facility] OR[facility];  Service: General;  Laterality: N/A;    Social History   Socioeconomic History   Marital status: Single    Spouse name: Not on file   Number of children: Not on file   Years of education: Not on file   Highest education level: Some college, no degree   Occupational History   Not on file  Tobacco Use   Smoking status: Former    Current packs/day: 0.00    Types: Cigarettes, Cigars    Quit date: 09/19/2011    Years since quitting: 12.9   Smokeless tobacco: Never  Vaping Use   Vaping status: Former  Substance and Sexual Activity   Alcohol use: Not Currently    Comment: Occasional glass of wine   Drug use: No   Sexual activity: Not Currently  Other Topics Concern   Not on file  Social History Narrative   Works in Audiological scientist at [employer].   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   Social Drivers of Corporate investment banker Strain: Low Risk  (06/17/2023)   Overall Financial Resource Strain (CARDIA)    Difficulty of Paying Living Expenses: Not very hard  Food Insecurity: No Food Insecurity (10/28/2023)   Hunger Vital Sign    Worried About Running Out of Food in the Last Year: Never true    Ran Out of Food in the Last Year: Never true  Transportation Needs: No Transportation Needs (10/28/2023)   PRAPARE - Transportation  Lack of Transportation (Medical): No    Lack of Transportation (Non-Medical): No  Physical Activity: Sufficiently Active (06/17/2023)   Exercise Vital Sign    Days of Exercise per Week: 7 days    Minutes of Exercise per Session: 60 min  Stress: Stress Concern Present (06/17/2023)   [employer] of Occupational Health - Occupational Stress Questionnaire    Feeling of Stress : To some extent  Social Connections: Moderately Integrated (06/17/2023)   Social Connection and Isolation Panel    Frequency of Communication with Friends and Family: More than three times a week    Frequency of Social Gatherings with Friends and Family: Once a week    Attends Religious Services: More than 4 times per year    Active Member of [employer] or Organizations: Yes    Attends Banker Meetings: More than 4 times per year    Marital Status: Divorced  Intimate Partner Violence: Not At Risk (10/28/2023)    Humiliation, Afraid, Rape, and Kick questionnaire    Fear of Current or Ex-Partner: No    Emotionally Abused: No    Physically Abused: No    Sexually Abused: No    Family History  Problem Relation Age of Onset   Cancer - Cervical Mother        dx 81s; Passed away from Kidney Issues (Not Cancer)   Heart disease Father    Kidney disease Father    Diabetes Father    Skin cancer Sister    Stomach cancer Paternal Uncle    Diabetes Maternal Grandmother    Skin cancer Maternal Grandfather    Diabetes Paternal Grandmother    Breast cancer Other        mat great aunts x2; unk age of dx   Lung cancer Cousin    Brain cancer Cousin      Current Outpatient Medications:    acetaminophen  (TYLENOL ) 500 MG tablet, Take 500 mg by mouth every 6 (six) hours as needed., Disp: , Rfl:    atenolol  (TENORMIN ) 25 MG tablet, TAKE 1 TABLET (25 MG TOTAL) BY MOUTH DAILY. FOR PALPITATIONS., Disp: 90 tablet, Rfl: 0   Biotin 1000 MCG tablet, Take 1,000 mcg by mouth daily., Disp: , Rfl:    blood glucose meter kit and supplies KIT, Dispense based on patient and insurance preference. Use up to four times daily as directed. (FOR ICD-9 250.00, 250.01)., Disp: 1 each, Rfl: 0   cyanocobalamin 500 MCG tablet, Take 1,000 mcg by mouth daily. Reported on 06/12/2016, Disp: , Rfl:    docusate sodium (COLACE) 100 MG capsule, Take 200 mg by mouth daily as needed for moderate constipation. Reported on 06/12/2016, Disp: , Rfl:    ibuprofen  (ADVIL ) 800 MG tablet, Take 1 tablet (800 mg total) by mouth every 8 (eight) hours as needed., Disp: 30 tablet, Rfl: 0   lidocaine -prilocaine  (EMLA ) cream, APPLY TO AFFECTED AREA ONCE AS DIRECTED, Disp: 30 g, Rfl: 3   metFORMIN  (GLUCOPHAGE -XR) 500 MG 24 hr tablet, TAKE 1 TABLET (500 MG TOTAL) BY MOUTH DAILY WITH BREAKFAST. FOR DIABETES., Disp: 90 tablet, Rfl: 1   pyridOXINE (VITAMIN B6) 25 MG tablet, Take 25 mg by mouth daily., Disp: , Rfl:    rosuvastatin  (CRESTOR ) 5 MG tablet, TAKE 1 TABLET  (5 MG TOTAL) BY MOUTH EVERY EVENING. FOR CHOLESTEROL., Disp: 90 tablet, Rfl: 0   Semaglutide ,0.25 or 0.5MG /DOS, (OZEMPIC , 0.25 OR 0.5 MG/DOSE,) 2 MG/3ML SOPN, Inject 0.25 mg into the skin once weekly for 4 weeks, then increase  to 0.5 mg once weekly thereafter for diabetes., Disp: 9 mL, Rfl: 1   silver  sulfADIAZINE  (SILVADENE ) 1 % cream, Apply 1 Application topically 2 (two) times daily., Disp: 50 g, Rfl: 0 No current facility-administered medications for this visit.  Facility-Administered Medications Ordered in Other Visits:    0.9 %  sodium chloride  infusion, , Intravenous, Continuous, Melanee Annah BROCKS, MD, Stopped at 11/27/23 1158  Physical exam:  Vitals:   08/12/24 1007  BP: (!) 124/55  Pulse: 72  Resp: 20  Temp: (!) 97.1 F (36.2 C)  TempSrc: Tympanic  SpO2: 97%  Weight: 209 lb 1.6 oz (94.8 kg)  Height: 5' 5 (1.651 m)   Physical Exam Cardiovascular:     Rate and Rhythm: Normal rate and regular rhythm.     Heart sounds: Normal heart sounds.  Pulmonary:     Effort: Pulmonary effort is normal.     Breath sounds: Normal breath sounds.  Skin:    General: Skin is warm and dry.  Neurological:     Mental Status: She is alert and oriented to person, place, and time.      I have personally reviewed labs listed below:    Latest Ref Rng & Units 07/01/2024    1:05 PM  CMP  Glucose 70 - 99 mg/dL 745   BUN 8 - 23 mg/dL 15   Creatinine 9.55 - 1.00 mg/dL 9.22   Sodium 864 - 854 mmol/L 135   Potassium 3.5 - 5.1 mmol/L 3.6   Chloride 98 - 111 mmol/L 104   CO2 22 - 32 mmol/L 23   Calcium  8.9 - 10.3 mg/dL 8.8   Total Protein 6.5 - 8.1 g/dL 6.8   Total Bilirubin 0.0 - 1.2 mg/dL 0.8   Alkaline Phos 38 - 126 U/L 87   AST 15 - 41 U/L 58   ALT 0 - 44 U/L 44       Latest Ref Rng & Units 07/01/2024    1:05 PM  CBC  WBC 4.0 - 10.5 K/uL 4.1   Hemoglobin 12.0 - 15.0 g/dL 87.3   Hematocrit 63.9 - 46.0 % 36.8   Platelets 150 - 400 K/uL 269      Assessment and plan- Patient is a 63  y.o. 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 She is s/p 9 weekly cycles of Abraxane  and Herceptin  which was subsequently stopped due to intolerance.  She is here for on treatment assessment prior to cycle 11 of maintenance Herceptin   Counts okay to proceed with cycle 11 of maintenance HerceptinToday and I will see her back in 3 weeks for cycle 12.  I will plan to get a repeat echocardiogram towards the end of October.  She completes 1 year of treatment in December 2025.   Visit Diagnosis 1. Encounter for monoclonal antibody treatment for malignancy   2. 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 in female, estrogen receptor negative (HCC)      Dr. Annah Melanee, MD, MPH [facility] [phone] 08/12/2024 9:36 AM

## 2024-08-12 NOTE — Progress Notes (Signed)
 Patient states she's constantly sore on the left side (surgery side). She would just like insight on how to treat pain.

## 2024-08-13 ENCOUNTER — Other Ambulatory Visit: Payer: Self-pay | Admitting: Primary Care

## 2024-08-13 DIAGNOSIS — E119 Type 2 diabetes mellitus without complications: Secondary | ICD-10-CM

## 2024-08-30 ENCOUNTER — Ambulatory Visit (INDEPENDENT_AMBULATORY_CARE_PROVIDER_SITE_OTHER)
Admission: RE | Admit: 2024-08-30 | Discharge: 2024-08-30 | Disposition: A | Discharge status: Home / Self Care | Source: Ambulatory Visit | Attending: Primary Care | Admitting: Primary Care

## 2024-08-30 ENCOUNTER — Ambulatory Visit: Admitting: Primary Care

## 2024-08-30 ENCOUNTER — Ambulatory Visit: Payer: Self-pay | Admitting: Primary Care

## 2024-08-30 ENCOUNTER — Encounter: Payer: Self-pay | Admitting: Primary Care

## 2024-08-30 VITALS — BP 126/78 | HR 89 | Temp 97.5°F | Ht 65.0 in | Wt 208.0 lb

## 2024-08-30 DIAGNOSIS — K5900 Constipation, unspecified: Secondary | ICD-10-CM | POA: Diagnosis not present

## 2024-08-30 DIAGNOSIS — E041 Nontoxic single thyroid nodule: Secondary | ICD-10-CM | POA: Diagnosis not present

## 2024-08-30 DIAGNOSIS — G629 Polyneuropathy, unspecified: Secondary | ICD-10-CM

## 2024-08-30 DIAGNOSIS — R002 Palpitations: Secondary | ICD-10-CM

## 2024-08-30 DIAGNOSIS — Z0001 Encounter for general adult medical examination with abnormal findings: Secondary | ICD-10-CM

## 2024-08-30 DIAGNOSIS — E785 Hyperlipidemia, unspecified: Secondary | ICD-10-CM | POA: Diagnosis not present

## 2024-08-30 DIAGNOSIS — Z23 Encounter for immunization: Secondary | ICD-10-CM | POA: Diagnosis not present

## 2024-08-30 DIAGNOSIS — E119 Type 2 diabetes mellitus without complications: Secondary | ICD-10-CM

## 2024-08-30 DIAGNOSIS — E0590 Thyrotoxicosis, unspecified without thyrotoxic crisis or storm: Secondary | ICD-10-CM

## 2024-08-30 DIAGNOSIS — R109 Unspecified abdominal pain: Secondary | ICD-10-CM | POA: Insufficient documentation

## 2024-08-30 LAB — MICROALBUMIN / CREATININE URINE RATIO
Creatinine,U: 33.2 mg/dL
Microalb Creat Ratio: UNDETERMINED mg/g (ref 0.0–30.0)
Microalb, Ur: <0.7 mg/dL

## 2024-08-30 LAB — HEMOGLOBIN A1C: Hgb A1c MFr Bld: 7.9 % — ABNORMAL HIGH (ref 4.6–6.5)

## 2024-08-30 LAB — TSH: TSH: 0.77 u[IU]/mL (ref 0.35–5.50)

## 2024-08-30 LAB — LIPID PANEL
Cholesterol: 145 mg/dL (ref 0–200)
HDL: 49 mg/dL (ref >39.00)
LDL Cholesterol: 77 mg/dL (ref 0–99)
NonHDL: 95.88
Total CHOL/HDL Ratio: 3
Triglycerides: 95 mg/dL (ref 0.0–149.0)
VLDL: 19 mg/dL (ref 0.0–40.0)

## 2024-08-30 NOTE — Assessment & Plan Note (Signed)
 Differentials include osteoarthritis of the hip, constipation, statin myalgia. Less likely acute diverticulitis given location of pain and lack of other cardinal symptoms.  Will start with abdominal plain films today. Consider holding statin x 2 weeks.  Await results.

## 2024-08-30 NOTE — Assessment & Plan Note (Signed)
 She is skipping doses of Ozempic  due to constipation.  Discussed Miralax.  Increase dose to 0.5 mg weekly next week.   Repeat A1C pending. Urine microalbumin pending.

## 2024-08-30 NOTE — Assessment & Plan Note (Signed)
Controlled.  Continue atenolol 25 mg daily.

## 2024-08-30 NOTE — Assessment & Plan Note (Signed)
 Ineffective at 100-200 mg dose. Increase to 300 mg HS.   She will update.

## 2024-08-30 NOTE — Assessment & Plan Note (Signed)
 Repeat lipid panel. Continue rosuvastatin  5 mg daily.

## 2024-08-30 NOTE — Assessment & Plan Note (Signed)
 Immunizations UTD.Influenza vaccine provided today.  Pap smear UTD. Mammogram UTD and scheduled Colonoscopy UTD, due 2028  Discussed the importance of a healthy diet and regular exercise in order for weight loss, and to reduce the risk of further co-morbidity.  Exam stable. Labs pending.  Follow up in 1 year for repeat physical.

## 2024-08-30 NOTE — Progress Notes (Addendum)
 Subjective:    Patient ID: Rebecca Macias, female    DOB: 1961/10/18, 63 y.o.   MRN: [id number]  Rebecca Macias is a very pleasant 63 y.o. female with a history of type 2 diabetes, chronic low back pain with left-sided sciatica, breast cancer with chemotherapy who presents today for complete physical and follow up of chronic conditions.  She would also like to discuss left lower side pain. Acute for the last 1 week with left lateral side pain proximal to the hip.  Increased constipation since starting Ozempic . She is having bowel movements daily to every other day, has been taking Colace and other laxatives with temporary improvement. She took 800 mg of Ibuprofen  yesterday which relieved her left side pain. She has a history of diverticulitis, this feels different. She denies nausea, vomiting, fevers, left hip pain. She describes her pain as a pulled muscle.   Immunizations: -Tetanus: Completed in 2016 -Influenza: Influenza vaccine provided today.  -Shingles: Completed Shingrix series -Pneumonia: Completed Prevnar 20 in 2024   Diet: Fair diet.  Exercise: No regular exercise.  Eye exam: Completes annually  Dental exam: Completes semi-annually    Pap Smear: Completed in 2024 Mammogram: Completed in 2025, scheduled for October 2025   Colonoscopy: Completed in 2023, due 2028   BP Readings from Last 3 Encounters:  08/30/24 126/78  08/12/24 (!) 105/58  08/12/24 (!) 124/55       Review of Systems  Constitutional:  Negative for unexpected weight change.  HENT:  Negative for rhinorrhea.   Respiratory:  Negative for cough and shortness of breath.   Cardiovascular:  Negative for chest pain.  Gastrointestinal:  Negative for constipation and diarrhea.  Genitourinary:  Negative for difficulty urinating.  Musculoskeletal:  Positive for arthralgias and back pain.  Skin:  Negative for rash.  Allergic/Immunologic: Negative for environmental allergies.  Neurological:  Positive for  numbness. Negative for dizziness and headaches.  Psychiatric/Behavioral:  The patient is not nervous/anxious.          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, estrogen receptor negative (HCC)    Obesity    Ovarian cyst    Palpitations    Personal history of chemotherapy    Personal history of radiation therapy    Sigmoid diverticulitis 08/2012   Subclinical hypothyroidism    SVT (supraventricular tachycardia)    on Holter monitor 5 beat run of svt   Thyroid  nodule    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   Type 2 diabetes mellitus (HCC)     Social History   Socioeconomic History   Marital status: Single    Spouse name: Not on file   Number of children: Not on file   Years of education: Not on file   Highest education level: Some college, no degree  Occupational History   Not on file  Tobacco Use   Smoking status: Former    Current packs/day: 0.00    Types: Cigarettes, Cigars    Quit date: 09/19/2011    Years since quitting: 12.9   Smokeless tobacco: Never  Vaping Use   Vaping status: Former  Substance and Sexual Activity   Alcohol use:  Not Currently    Comment: Occasional glass of wine   Drug use: No   Sexual activity: Not Currently  Other Topics Concern   Not on file  Social History Narrative   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   Social Drivers of Corporate investment banker Strain: Low Risk  (06/17/2023)   Overall Financial Resource Strain (CARDIA)    Difficulty of Paying Living Expenses: Not very hard  Food Insecurity: No Food Insecurity (10/28/2023)   Hunger Vital Sign    Worried About Running Out of Food in  the Last Year: Never true    Ran Out of Food in the Last Year: Never true  Transportation Needs: No Transportation Needs (10/28/2023)   PRAPARE - Administrator, Civil Service (Medical): No    Lack of Transportation (Non-Medical): No  Physical Activity: Sufficiently Active (06/17/2023)   Exercise Vital Sign    Days of Exercise per Week: 7 days    Minutes of Exercise per Session: 60 min  Stress: Stress Concern Present (06/17/2023)   [employer] of Occupational Health - Occupational Stress Questionnaire    Feeling of Stress : To some extent  Social Connections: Moderately Integrated (06/17/2023)   Social Connection and Isolation Panel    Frequency of Communication with Friends and Family: More than three times a week    Frequency of Social Gatherings with Friends and Family: Once a week    Attends Religious Services: More than 4 times per year    Active Member of [employer] or Organizations: Yes    Attends Banker Meetings: More than 4 times per year    Marital Status: Divorced  Intimate Partner Violence: Not At Risk (10/28/2023)   Humiliation, Afraid, Rape, and Kick questionnaire    Fear of Current or Ex-Partner: No    Emotionally Abused: No    Physically Abused: No    Sexually Abused: No    Past Surgical History:  Procedure Laterality Date   AXILLARY SENTINEL NODE BIOPSY Left 11/04/2023   Procedure: AXILLARY SENTINEL NODE BIOPSY;  Surgeon: Lane Shope, MD;  Location: [facility];  Service: General;  Laterality: Left;   BLADDER SUSPENSION  2009   BREAST BIOPSY Left 10/22/2023   Us  Core Bx, heart clip - path pending   BREAST BIOPSY Left 10/22/2023   US  LT BREAST BX W LOC DEV 1ST LESION IMG BX SPEC US  GUIDE 10/22/2023 [facility]   BREAST LUMPECTOMY WITH RADIOFREQUENCY TAG IDENTIFICATION Left 11/04/2023   Procedure: BREAST LUMPECTOMY WITH RADIOFREQUENCY TAG IDENTIFICATION;  Surgeon: Lane Shope, MD;  Location: [facility];  Service: General;   Laterality: Left;   Colectomy Left    likely sigmoid, open.  for diverticulitis.   COLONOSCOPY WITH PROPOFOL  N/A 10/01/2022   Procedure: COLONOSCOPY WITH PROPOFOL ;  Surgeon: Unk Corinn Skiff, MD;  Location: [facility];  Service: Gastroenterology;  Laterality: N/A;   PORTACATH PLACEMENT Right 11/04/2023   Procedure: INSERTION PORT-A-CATH;  Surgeon: Lane Shope, MD;  Location: [facility];  Service: General;  Laterality: Right;   TUBAL LIGATION     XI ROBOTIC LAPAROSCOPIC ASSISTED APPENDECTOMY N/A 12/29/2022   Procedure: XI ROBOTIC LYSIS OF ADHESIONS AND APPENDECTOMY;  Surgeon: Lane Shope, MD;  Location: [facility];  Service: General;  Laterality: N/A;    Family History  Problem Relation Age of Onset   Cancer - Cervical Mother        dx 69s; Passed away from Kidney Issues (  Not Cancer)   Heart disease Father    Kidney disease Father    Diabetes Father    Skin cancer Sister    Stomach cancer Paternal Uncle    Diabetes Maternal Grandmother    Skin cancer Maternal Grandfather    Diabetes Paternal Grandmother    Breast cancer Other        mat great aunts x2; unk age of dx   Lung cancer Cousin    Brain cancer Cousin     Allergies  Allergen Reactions   Paclitaxel  Shortness Of Breath, Other (See Comments) and Hypertension    Redness to face/chest Back Pain     Flagyl [Metronidazole] Itching    Current Outpatient Medications on File Prior to Visit  Medication Sig Dispense Refill   acetaminophen  (TYLENOL ) 500 MG tablet Take 500 mg by mouth every 6 (six) hours as needed.     atenolol  (TENORMIN ) 25 MG tablet TAKE 1 TABLET (25 MG TOTAL) BY MOUTH DAILY. FOR PALPITATIONS. 90 tablet 0   Biotin 1000 MCG tablet Take 1,000 mcg by mouth daily.     blood glucose meter kit and supplies KIT Dispense based on patient and insurance preference. Use up to four times daily as directed. (FOR ICD-9 250.00, 250.01). 1 each 0   cyanocobalamin 500 MCG tablet Take 1,000 mcg by mouth daily.  Reported on 06/12/2016     docusate sodium (COLACE) 100 MG capsule Take 200 mg by mouth daily as needed for moderate constipation. Reported on 06/12/2016     ibuprofen  (ADVIL ) 800 MG tablet Take 1 tablet (800 mg total) by mouth every 8 (eight) hours as needed. 30 tablet 0   lidocaine -prilocaine  (EMLA ) cream APPLY TO AFFECTED AREA ONCE AS DIRECTED 30 g 3   metFORMIN  (GLUCOPHAGE -XR) 500 MG 24 hr tablet TAKE 1 TABLET (500 MG TOTAL) BY MOUTH DAILY WITH BREAKFAST. FOR DIABETES. 90 tablet 1   pyridOXINE (VITAMIN B6) 25 MG tablet Take 25 mg by mouth daily.     rosuvastatin  (CRESTOR ) 5 MG tablet TAKE 1 TABLET (5 MG TOTAL) BY MOUTH EVERY EVENING. FOR CHOLESTEROL. 90 tablet 0   Semaglutide ,0.25 or 0.5MG /DOS, (OZEMPIC , 0.25 OR 0.5 MG/DOSE,) 2 MG/3ML SOPN Inject 0.5 mg into the skin once a week. for diabetes. 9 mL 0   silver  sulfADIAZINE  (SILVADENE ) 1 % cream Apply 1 Application topically 2 (two) times daily. (Patient not taking: Reported on 08/30/2024) 50 g 0   Current Facility-Administered Medications on File Prior to Visit  Medication Dose Route Frequency Provider Last Rate Last Admin   0.9 %  sodium chloride  infusion   Intravenous Continuous Melanee Annah BROCKS, MD   Stopped at 11/27/23 1158    BP 126/78   Pulse 89   Temp (!) 97.5 F (36.4 C) (Temporal)   Ht 5' 5 (1.651 m)   Wt 208 lb (94.3 kg)   LMP 11/03/2012   SpO2 97%   BMI 34.61 kg/m  Objective:   Physical Exam HENT:     Right Ear: Tympanic membrane and ear canal normal.     Left Ear: Tympanic membrane and ear canal normal.  Eyes:     Pupils: Pupils are equal, round, and reactive to light.  Cardiovascular:     Rate and Rhythm: Normal rate and regular rhythm.  Pulmonary:     Effort: Pulmonary effort is normal.     Breath sounds: Normal breath sounds.  Abdominal:     General: Bowel sounds are normal.     Palpations: Abdomen is soft.  Tenderness: There is no abdominal tenderness.  Musculoskeletal:        General: Normal range of motion.      Cervical back: Neck supple.     Left hip: No bony tenderness. Normal range of motion. Normal strength.  Skin:    General: Skin is warm and dry.  Neurological:     Mental Status: She is alert and oriented to person, place, and time.     Cranial Nerves: No cranial nerve deficit.     Deep Tendon Reflexes:     Reflex Scores:      Patellar reflexes are 2+ on the right side and 2+ on the left side. Psychiatric:        Mood and Affect: Mood normal.    Physical Exam        Assessment & Plan:  Encounter for annual general medical examination with abnormal findings in adult Assessment & Plan: Immunizations UTD.Influenza vaccine provided today.  Pap smear UTD. Mammogram UTD and scheduled Colonoscopy UTD, due 2028  Discussed the importance of a healthy diet and regular exercise in order for weight loss, and to reduce the risk of further co-morbidity.  Exam stable. Labs pending.  Follow up in 1 year for repeat physical.    Thyroid  nodule, cold Assessment & Plan: Released from endocrinology due to benign unchanged nodule   Subclinical hyperthyroidism Assessment & Plan: Repeat TSH pending.  No longer following with endocrinology. Continue without medication   Orders: -     TSH  Non-insulin  dependent type 2 diabetes mellitus (HCC) Assessment & Plan: She is skipping doses of Ozempic  due to constipation.  Discussed Miralax.  Increase dose to 0.5 mg weekly next week.   Repeat A1C pending. Urine microalbumin pending.  Orders: -     Hemoglobin A1c -     Microalbumin / creatinine urine ratio  Palpitations Assessment & Plan: Controlled.  Continue atenolol  25 mg daily.   Neuropathy Assessment & Plan: Ineffective at 100-200 mg dose. Increase to 300 mg HS.   She will update.    Hyperlipidemia, unspecified hyperlipidemia type Assessment & Plan: Repeat lipid panel. Continue rosuvastatin  5 mg daily.  Orders: -     Lipid panel  Encounter for  immunization -     Flu vaccine trivalent PF, 6mos and older(Flulaval,Afluria,Fluarix,Fluzone)  Side pain Assessment & Plan: Differentials include osteoarthritis of the hip, constipation, statin myalgia. Less likely acute diverticulitis given location of pain and lack of other cardinal symptoms.  Will start with abdominal plain films today. Consider holding statin x 2 weeks.  Await results.  Orders: -     DG Abd 2 Views  Constipation, unspecified constipation type Assessment & Plan: Discussed daily MiraLAX.  Abdominal plain films pending.     Assessment and Plan Assessment & Plan         Rebecca MARLA Gaskins, NP     History of Present Illness

## 2024-08-30 NOTE — Assessment & Plan Note (Signed)
 Released from endocrinology due to benign unchanged nodule

## 2024-08-30 NOTE — Assessment & Plan Note (Signed)
 Discussed daily MiraLAX.  Abdominal plain films pending.

## 2024-08-30 NOTE — Patient Instructions (Signed)
 Complete xray(s) and labs prior to leaving today. I will notify you of your results once received.  Increase your Ozempic  to 0.5 mg weekly starting next week.  Try MiraLAX daily for constipation.  Please schedule a follow up visit for 6 months for a diabetes check.  It was a pleasure to see you today!

## 2024-08-30 NOTE — Assessment & Plan Note (Signed)
 Repeat TSH pending.  No longer following with endocrinology. Continue without medication

## 2024-08-31 ENCOUNTER — Other Ambulatory Visit: Payer: Self-pay

## 2024-09-01 ENCOUNTER — Encounter: Admitting: Primary Care

## 2024-09-02 ENCOUNTER — Encounter: Payer: Self-pay | Admitting: Oncology

## 2024-09-02 ENCOUNTER — Inpatient Hospital Stay

## 2024-09-02 ENCOUNTER — Inpatient Hospital Stay (HOSPITAL_BASED_OUTPATIENT_CLINIC_OR_DEPARTMENT_OTHER): Admitting: Oncology

## 2024-09-02 ENCOUNTER — Other Ambulatory Visit: Payer: Self-pay | Admitting: Primary Care

## 2024-09-02 VITALS — BP 112/54 | HR 72

## 2024-09-02 VITALS — BP 132/65 | HR 98 | Temp 98.3°F | Resp 16 | Wt 207.0 lb

## 2024-09-02 DIAGNOSIS — C50312 Malignant neoplasm of lower-inner quadrant of left female breast: Secondary | ICD-10-CM

## 2024-09-02 DIAGNOSIS — Z171 Estrogen receptor negative status [ER-]: Secondary | ICD-10-CM | POA: Diagnosis not present

## 2024-09-02 DIAGNOSIS — Z5112 Encounter for antineoplastic immunotherapy: Secondary | ICD-10-CM

## 2024-09-02 DIAGNOSIS — E119 Type 2 diabetes mellitus without complications: Secondary | ICD-10-CM

## 2024-09-02 MED ORDER — TRASTUZUMAB-ANNS CHEMO 150 MG IV SOLR
6.0000 mg/kg | Freq: Once | INTRAVENOUS | Status: AC
  Administered 2024-09-02: 600 mg via INTRAVENOUS
  Filled 2024-09-02: qty 28.57

## 2024-09-02 MED ORDER — ACETAMINOPHEN 325 MG PO TABS
650.0000 mg | ORAL_TABLET | Freq: Once | ORAL | Status: AC
  Administered 2024-09-02: 650 mg via ORAL
  Filled 2024-09-02: qty 2

## 2024-09-02 MED ORDER — DIPHENHYDRAMINE HCL 25 MG PO CAPS
25.0000 mg | ORAL_CAPSULE | Freq: Once | ORAL | Status: AC
  Administered 2024-09-02: 25 mg via ORAL
  Filled 2024-09-02: qty 1

## 2024-09-02 MED ORDER — SODIUM CHLORIDE 0.9 % IV SOLN
INTRAVENOUS | Status: DC
  Filled 2024-09-02: qty 250

## 2024-09-02 NOTE — Progress Notes (Signed)
 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  Ultrasound of the left axilla demonstrated normal lymph nodes.  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  8 mm.  Tumor was ER/PR -0% and HER2 positive +3 by IHC.    Menarche at the age of 63.  She is G5, P5.  Age at first birth 79.  She has used birth control pills in the past.  She attained menopause in her 50s.  Family history significant for breast cancer in 2 sisters of her maternal grandmother.  Mom with cervical cancer in her 29s.  Paternal uncle with stomach cancer.  Her sister has been diagnosed with some form of skin cancer.   Left lumpectomy pathology from 11/04/2023 showed 10 mm grade 3 tumor with negative margins.  3 sentinel lymph nodes negative for malignancy.  Tumor was ER/PR negative and HER2 positive.  Patient had significant infusion reaction with first cycle of Taxol .  She  subsequently went on to do 9 cycles of weekly Abraxane  and Herceptin .  She had worsening neuropathy skin rash and edema with Abraxane  and was subsequently stopped.  She is currently undergoing adjuvant radiation therapy and receiving maintenance Herceptin     Interval history-patient is doing well presently.  Denies any complaints due to Herceptin .  She has some baseline peripheral neuropathy in her feet which is mild.  Gabapentin  did make her feel tired and sleepy and therefore she is not taking it  ECOG PS- 1 Pain scale- 0   Review of systems- Review of Systems  Constitutional:  Negative for chills, fever, malaise/fatigue and weight loss.  HENT:  Negative for congestion, ear discharge and nosebleeds.   Eyes:  Negative for blurred vision.  Respiratory:  Negative for cough, hemoptysis, sputum production, shortness of breath and wheezing.   Cardiovascular:  Negative for chest pain, palpitations, orthopnea and claudication.  Gastrointestinal:  Negative for abdominal pain, blood in stool, constipation, diarrhea, heartburn, melena, nausea and vomiting.  Genitourinary:  Negative for dysuria, flank pain, frequency, hematuria and urgency.  Musculoskeletal:  Negative for back pain, joint pain and myalgias.  Skin:  Negative for rash.  Neurological:  Negative for dizziness, tingling, focal weakness, seizures, weakness and headaches.  Endo/Heme/Allergies:  Does not bruise/bleed easily.  Psychiatric/Behavioral:  Negative for depression and suicidal ideas. The patient does not have insomnia.       Allergies  Allergen Reactions   Paclitaxel  Shortness Of Breath, Other (See Comments) and Hypertension    Redness to face/chest Back Pain     Flagyl [Metronidazole]  Itching     Past Medical History:  Diagnosis Date   Adenoma of appendix    Anemia    Chronic lower back pain    GERD (gastroesophageal reflux disease)    H/O gestational diabetes mellitus, not currently pregnant    History of kidney  stones    Hypercholesteremia    Hyperplastic rectal polyp    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, estrogen receptor negative (HCC)    Obesity    Ovarian cyst    Palpitations    Personal history of chemotherapy    Personal history of radiation therapy    Sigmoid diverticulitis 08/2012   Subclinical hypothyroidism    SVT (supraventricular tachycardia)    on Holter monitor 5 beat run of svt   Thyroid  nodule    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   Type 2 diabetes mellitus (HCC)      Past Surgical History:  Procedure Laterality Date   AXILLARY SENTINEL NODE BIOPSY Left 11/04/2023   Procedure: AXILLARY SENTINEL NODE BIOPSY;  Surgeon: Lane Shope, MD;  Location: [facility] ORS;  Service: General;  Laterality: Left;   BLADDER SUSPENSION  2009   BREAST BIOPSY Left 10/22/2023   Us  Core Bx, heart clip - path pending   BREAST BIOPSY Left 10/22/2023   US  LT BREAST BX W LOC DEV 1ST LESION IMG BX SPEC US  GUIDE 10/22/2023 [facility]   BREAST LUMPECTOMY WITH RADIOFREQUENCY TAG IDENTIFICATION Left 11/04/2023   Procedure: BREAST LUMPECTOMY WITH RADIOFREQUENCY TAG IDENTIFICATION;  Surgeon: Lane Shope, MD;  Location: [facility] ORS;  Service: General;  Laterality: Left;   Colectomy Left    likely sigmoid, open.  for diverticulitis.   COLONOSCOPY WITH PROPOFOL  N/A 10/01/2022   Procedure: COLONOSCOPY WITH PROPOFOL ;  Surgeon: Unk Corinn Skiff, MD;  Location: [facility];  Service: Gastroenterology;  Laterality: N/A;   PORTACATH PLACEMENT Right 11/04/2023   Procedure: INSERTION PORT-A-CATH;  Surgeon: Lane Shope, MD;  Location: [facility] ORS;  Service: General;  Laterality: Right;   TUBAL LIGATION     XI ROBOTIC LAPAROSCOPIC ASSISTED APPENDECTOMY N/A 12/29/2022   Procedure: XI ROBOTIC LYSIS OF ADHESIONS AND APPENDECTOMY;  Surgeon: Lane Shope, MD;  Location: [facility] ORS;  Service: General;  Laterality: N/A;    Social History   Socioeconomic History   Marital status: Single     Spouse name: Not on file   Number of children: Not on file   Years of education: Not on file   Highest education level: Some college, no degree  Occupational History   Not on file  Tobacco Use   Smoking status: Former    Current packs/day: 0.00    Types: Cigarettes, Cigars    Quit date: 09/19/2011    Years since quitting: 12.9   Smokeless tobacco: Never  Vaping Use   Vaping status: Former  Substance and Sexual Activity   Alcohol use: Not Currently    Comment: Occasional glass of wine   Drug use: No   Sexual activity: Not Currently  Other Topics Concern   Not on file  Social History Narrative   Works in Audiological scientist at [employer].   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   Social Drivers of Corporate investment banker Strain: Low Risk  (06/17/2023)   Overall Financial Resource Strain (CARDIA)    Difficulty of Paying Living Expenses: Not very hard  Food Insecurity: No Food Insecurity (10/28/2023)   Hunger Vital Sign    Worried About Running Out of Food in the Last Year:  Never true    Ran Out of Food in the Last Year: Never true  Transportation Needs: No Transportation Needs (10/28/2023)   PRAPARE - Administrator, Civil Service (Medical): No    Lack of Transportation (Non-Medical): No  Physical Activity: Sufficiently Active (06/17/2023)   Exercise Vital Sign    Days of Exercise per Week: 7 days    Minutes of Exercise per Session: 60 min  Stress: Stress Concern Present (06/17/2023)   [employer] of Occupational Health - Occupational Stress Questionnaire    Feeling of Stress : To some extent  Social Connections: Moderately Integrated (06/17/2023)   Social Connection and Isolation Panel    Frequency of Communication with Friends and Family: More than three times a week    Frequency of Social Gatherings with Friends and Family: Once a week    Attends Religious Services: More than 4 times per year    Active Member of [employer] or Organizations: Yes     Attends Banker Meetings: More than 4 times per year    Marital Status: Divorced  Intimate Partner Violence: Not At Risk (10/28/2023)   Humiliation, Afraid, Rape, and Kick questionnaire    Fear of Current or Ex-Partner: No    Emotionally Abused: No    Physically Abused: No    Sexually Abused: No    Family History  Problem Relation Age of Onset   Cancer - Cervical Mother        dx 36s; Passed away from Kidney Issues (Not Cancer)   Heart disease Father    Kidney disease Father    Diabetes Father    Skin cancer Sister    Stomach cancer Paternal Uncle    Diabetes Maternal Grandmother    Skin cancer Maternal Grandfather    Diabetes Paternal Grandmother    Breast cancer Other        mat great aunts x2; unk age of dx   Lung cancer Cousin    Brain cancer Cousin      Current Outpatient Medications:    acetaminophen  (TYLENOL ) 500 MG tablet, Take 500 mg by mouth every 6 (six) hours as needed., Disp: , Rfl:    atenolol  (TENORMIN ) 25 MG tablet, TAKE 1 TABLET (25 MG TOTAL) BY MOUTH DAILY. FOR PALPITATIONS., Disp: 90 tablet, Rfl: 0   Biotin 1000 MCG tablet, Take 1,000 mcg by mouth daily., Disp: , Rfl:    blood glucose meter kit and supplies KIT, Dispense based on patient and insurance preference. Use up to four times daily as directed. (FOR ICD-9 250.00, 250.01)., Disp: 1 each, Rfl: 0   cyanocobalamin 500 MCG tablet, Take 1,000 mcg by mouth daily. Reported on 06/12/2016, Disp: , Rfl:    docusate sodium (COLACE) 100 MG capsule, Take 200 mg by mouth daily as needed for moderate constipation. Reported on 06/12/2016, Disp: , Rfl:    ibuprofen  (ADVIL ) 800 MG tablet, Take 1 tablet (800 mg total) by mouth every 8 (eight) hours as needed., Disp: 30 tablet, Rfl: 0   lidocaine -prilocaine  (EMLA ) cream, APPLY TO AFFECTED AREA ONCE AS DIRECTED, Disp: 30 g, Rfl: 3   metFORMIN  (GLUCOPHAGE -XR) 500 MG 24 hr tablet, TAKE 1 TABLET (500 MG TOTAL) BY MOUTH DAILY WITH BREAKFAST. FOR DIABETES., Disp: 90  tablet, Rfl: 1   pyridOXINE (VITAMIN B6) 25 MG tablet, Take 25 mg by mouth daily., Disp: , Rfl:    rosuvastatin  (CRESTOR ) 5 MG tablet, TAKE 1 TABLET (5 MG TOTAL) BY MOUTH EVERY EVENING. FOR CHOLESTEROL., Disp:  90 tablet, Rfl: 0   Semaglutide ,0.25 or 0.5MG /DOS, (OZEMPIC , 0.25 OR 0.5 MG/DOSE,) 2 MG/3ML SOPN, Inject 0.5 mg into the skin once a week. for diabetes., Disp: 9 mL, Rfl: 0   silver  sulfADIAZINE  (SILVADENE ) 1 % cream, Apply 1 Application topically 2 (two) times daily. (Patient not taking: Reported on 09/02/2024), Disp: 50 g, Rfl: 0 No current facility-administered medications for this visit.  Facility-Administered Medications Ordered in Other Visits:    0.9 %  sodium chloride  infusion, , Intravenous, Continuous, Melanee Annah BROCKS, MD, Stopped at 11/27/23 1158   0.9 %  sodium chloride  infusion, , Intravenous, Continuous, Melanee Annah BROCKS, MD, Stopped at 09/02/24 1126  Physical exam:  Vitals:   09/02/24 0912  BP: 132/65  Pulse: 98  Resp: 16  Temp: 98.3 F (36.8 C)  TempSrc: Tympanic  SpO2: 97%  Weight: 207 lb (93.9 kg)   Physical Exam Cardiovascular:     Rate and Rhythm: Normal rate and regular rhythm.     Heart sounds: Normal heart sounds.  Pulmonary:     Effort: Pulmonary effort is normal.     Breath sounds: Normal breath sounds.  Skin:    General: Skin is warm and dry.  Neurological:     Mental Status: She is alert and oriented to person, place, and time.    Breast exam was performed in seated and lying down position. Patient is status post left lumpectomy with a well-healed surgical scar. No evidence of any palpable masses. No evidence of axillary adenopathy. No evidence of any palpable masses or lumps in the right breast. No evidence of right axillary adenopathy   I have personally reviewed labs listed below:    Latest Ref Rng & Units 08/12/2024    9:29 AM  CMP  Glucose 70 - 99 mg/dL 767   BUN 8 - 23 mg/dL 15   Creatinine 9.55 - 1.00 mg/dL 9.24   Sodium 864 - 854  mmol/L 138   Potassium 3.5 - 5.1 mmol/L 4.2   Chloride 98 - 111 mmol/L 106   CO2 22 - 32 mmol/L 24   Calcium  8.9 - 10.3 mg/dL 9.1   Total Protein 6.5 - 8.1 g/dL 7.0   Total Bilirubin 0.0 - 1.2 mg/dL 0.7   Alkaline Phos 38 - 126 U/L 84   AST 15 - 41 U/L 36   ALT 0 - 44 U/L 25       Latest Ref Rng & Units 08/12/2024    9:29 AM  CBC  WBC 4.0 - 10.5 K/uL 3.8   Hemoglobin 12.0 - 15.0 g/dL 86.8   Hematocrit 63.9 - 46.0 % 39.2   Platelets 150 - 400 K/uL 259      Assessment and plan- Patient is a 63 y.o. 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 She is s/p 9 weekly cycles of Abraxane  and Herceptin  which was subsequently stopped due to intolerance.  She is here for on treatment assessment prior to cycle 12 of maintenance Herceptin   Okay to proceed with cycle 12 of maintenance Herceptin  today and I will see her back in 3 weeks for cycle 13.  Repeat echocardiogram as well as mammogram is scheduled for next month.  Niccoli she is doing well with no concerning signs and symptoms of recurrence based on today's exam.  She has ER-negative disease and therefore she is not presently on endocrine therapy.  She completes 1  year of Herceptin  in December 2025   Visit Diagnosis 1. 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 in female, estrogen receptor negative (HCC)   2. Encounter for monoclonal antibody treatment for malignancy      Dr. Annah Skene, MD, MPH [facility] [phone] 09/02/2024 12:49 PM

## 2024-09-02 NOTE — Patient Instructions (Signed)
 [facility] - A DEPT OF [facility]  Discharge Instructions: Thank you for choosing [facility] to provide your oncology and hematology care.  If you have a lab appointment with the Cancer Center, please go directly to the Cancer Center and check in at the registration area.  Wear comfortable clothing and clothing appropriate for easy access to any Portacath or PICC line.   We strive to give you quality time with your provider. You may need to reschedule your appointment if you arrive late (15 or more minutes).  Arriving late affects you and other patients whose appointments are after yours.  Also, if you miss three or more appointments without notifying the office, you may be dismissed from the clinic at the provider's discretion.      For prescription refill requests, have your pharmacy contact our office and allow 72 hours for refills to be completed.    Today you received the following chemotherapy and/or immunotherapy agents Kanjinti       To help prevent nausea and vomiting after your treatment, we encourage you to take your nausea medication as directed.  BELOW ARE SYMPTOMS THAT SHOULD BE REPORTED IMMEDIATELY: *FEVER GREATER THAN 100.4 F (38 C) OR HIGHER *CHILLS OR SWEATING *NAUSEA AND VOMITING THAT IS NOT CONTROLLED WITH YOUR NAUSEA MEDICATION *UNUSUAL SHORTNESS OF BREATH *UNUSUAL BRUISING OR BLEEDING *URINARY PROBLEMS (pain or burning when urinating, or frequent urination) *BOWEL PROBLEMS (unusual diarrhea, constipation, pain near the anus) TENDERNESS IN MOUTH AND THROAT WITH OR WITHOUT PRESENCE OF ULCERS (sore throat, sores in mouth, or a toothache) UNUSUAL RASH, SWELLING OR PAIN  UNUSUAL VAGINAL DISCHARGE OR ITCHING   Items with * indicate a potential emergency and should be followed up as soon as possible or go to the Emergency Department if any problems should occur.  Please show the CHEMOTHERAPY ALERT CARD or IMMUNOTHERAPY  ALERT CARD at check-in to the Emergency Department and triage nurse.  Should you have questions after your visit or need to cancel or reschedule your appointment, please contact [facility] - A DEPT OF [facility]  [phone] and follow the prompts.  Office hours are 8:00 a.m. to 4:30 p.m. Monday - Friday. Please note that voicemails left after 4:00 p.m. may not be returned until the following business day.  We are closed weekends and major holidays. You have access to a nurse at all times for urgent questions. Please call the main number to the clinic [phone] and follow the prompts.  For any non-urgent questions, you may also contact your provider using MyChart. We now offer e-Visits for anyone 58 and older to request care online for non-urgent symptoms. For details visit [email or web address].   Also download the MyChart app! Go to the app store, search "MyChart", open the app, select [place], and log in with your MyChart username and password.

## 2024-09-23 ENCOUNTER — Encounter: Payer: Self-pay | Admitting: Oncology

## 2024-09-23 ENCOUNTER — Inpatient Hospital Stay

## 2024-09-23 ENCOUNTER — Inpatient Hospital Stay: Attending: Oncology | Admitting: Oncology

## 2024-09-23 ENCOUNTER — Inpatient Hospital Stay: Attending: Oncology

## 2024-09-23 VITALS — BP 108/64 | HR 80 | Temp 96.1°F | Resp 19 | Ht 65.0 in | Wt 203.6 lb

## 2024-09-23 VITALS — BP 115/69 | HR 73 | Resp 18

## 2024-09-23 DIAGNOSIS — C50312 Malignant neoplasm of lower-inner quadrant of left female breast: Secondary | ICD-10-CM | POA: Insufficient documentation

## 2024-09-23 DIAGNOSIS — Z5112 Encounter for antineoplastic immunotherapy: Secondary | ICD-10-CM | POA: Insufficient documentation

## 2024-09-23 DIAGNOSIS — G620 Drug-induced polyneuropathy: Secondary | ICD-10-CM | POA: Diagnosis not present

## 2024-09-23 DIAGNOSIS — Z171 Estrogen receptor negative status [ER-]: Secondary | ICD-10-CM

## 2024-09-23 DIAGNOSIS — Z1722 Progesterone receptor negative status: Secondary | ICD-10-CM | POA: Insufficient documentation

## 2024-09-23 DIAGNOSIS — T451X5A Adverse effect of antineoplastic and immunosuppressive drugs, initial encounter: Secondary | ICD-10-CM

## 2024-09-23 DIAGNOSIS — Z1731 Human epidermal growth factor receptor 2 positive status: Secondary | ICD-10-CM | POA: Diagnosis not present

## 2024-09-23 DIAGNOSIS — Z79899 Other long term (current) drug therapy: Secondary | ICD-10-CM | POA: Diagnosis not present

## 2024-09-23 LAB — CMP (CANCER CENTER ONLY)
ALT: 34 U/L (ref 0–44)
AST: 43 U/L — ABNORMAL HIGH (ref 15–41)
Albumin: 3.6 g/dL (ref 3.5–5.0)
Alkaline Phosphatase: 86 U/L (ref 38–126)
Anion gap: 8 (ref 5–15)
BUN: 12 mg/dL (ref 8–23)
CO2: 23 mmol/L (ref 22–32)
Calcium: 9 mg/dL (ref 8.9–10.3)
Chloride: 106 mmol/L (ref 98–111)
Creatinine: 0.7 mg/dL (ref 0.44–1.00)
GFR, Estimated: >60 mL/min (ref >60)
Glucose, Bld: 157 mg/dL — ABNORMAL HIGH (ref 70–99)
Potassium: 3.9 mmol/L (ref 3.5–5.1)
Sodium: 137 mmol/L (ref 135–145)
Total Bilirubin: 0.9 mg/dL (ref 0.0–1.2)
Total Protein: 7 g/dL (ref 6.5–8.1)

## 2024-09-23 LAB — CBC WITH DIFFERENTIAL/PLATELET
Abs Immature Granulocytes: 0.01 K/uL (ref 0.00–0.07)
Basophils Absolute: 0.1 K/uL (ref 0.0–0.1)
Basophils Relative: 1 %
Eosinophils Absolute: 0.3 K/uL (ref 0.0–0.5)
Eosinophils Relative: 7 %
HCT: 39.2 % (ref 36.0–46.0)
Hemoglobin: 13.3 g/dL (ref 12.0–15.0)
Immature Granulocytes: 0 %
Lymphocytes Relative: 48 %
Lymphs Abs: 2.3 K/uL (ref 0.7–4.0)
MCH: 30.6 pg (ref 26.0–34.0)
MCHC: 33.9 g/dL (ref 30.0–36.0)
MCV: 90.1 fL (ref 80.0–100.0)
Monocytes Absolute: 0.5 K/uL (ref 0.1–1.0)
Monocytes Relative: 10 %
Neutro Abs: 1.6 K/uL — ABNORMAL LOW (ref 1.7–7.7)
Neutrophils Relative %: 34 %
Platelets: 255 K/uL (ref 150–400)
RBC: 4.35 MIL/uL (ref 3.87–5.11)
RDW: 12.7 % (ref 11.5–15.5)
WBC: 4.8 K/uL (ref 4.0–10.5)
nRBC: 0 % (ref 0.0–0.2)

## 2024-09-23 MED ORDER — DIPHENHYDRAMINE HCL 25 MG PO CAPS
25.0000 mg | ORAL_CAPSULE | Freq: Once | ORAL | Status: AC
  Administered 2024-09-23: 25 mg via ORAL
  Filled 2024-09-23: qty 1

## 2024-09-23 MED ORDER — SODIUM CHLORIDE 0.9 % IV SOLN
INTRAVENOUS | Status: DC
  Filled 2024-09-23: qty 250

## 2024-09-23 MED ORDER — TRASTUZUMAB-ANNS CHEMO 150 MG IV SOLR
6.0000 mg/kg | Freq: Once | INTRAVENOUS | Status: AC
  Administered 2024-09-23: 600 mg via INTRAVENOUS
  Filled 2024-09-23: qty 28.57

## 2024-09-23 MED ORDER — ACETAMINOPHEN 325 MG PO TABS
650.0000 mg | ORAL_TABLET | Freq: Once | ORAL | Status: AC
  Administered 2024-09-23: 650 mg via ORAL
  Filled 2024-09-23: qty 2

## 2024-09-23 NOTE — Patient Instructions (Signed)
 [facility] - A DEPT OF [facility]  Discharge Instructions: Thank you for choosing [facility] to provide your oncology and hematology care.  If you have a lab appointment with the Cancer Center, please go directly to the Cancer Center and check in at the registration area.  Wear comfortable clothing and clothing appropriate for easy access to any Portacath or PICC line.   We strive to give you quality time with your provider. You may need to reschedule your appointment if you arrive late (15 or more minutes).  Arriving late affects you and other patients whose appointments are after yours.  Also, if you miss three or more appointments without notifying the office, you may be dismissed from the clinic at the provider's discretion.      For prescription refill requests, have your pharmacy contact our office and allow 72 hours for refills to be completed.    Today you received the following chemotherapy and/or immunotherapy agents Kanjinti       To help prevent nausea and vomiting after your treatment, we encourage you to take your nausea medication as directed.  BELOW ARE SYMPTOMS THAT SHOULD BE REPORTED IMMEDIATELY: *FEVER GREATER THAN 100.4 F (38 C) OR HIGHER *CHILLS OR SWEATING *NAUSEA AND VOMITING THAT IS NOT CONTROLLED WITH YOUR NAUSEA MEDICATION *UNUSUAL SHORTNESS OF BREATH *UNUSUAL BRUISING OR BLEEDING *URINARY PROBLEMS (pain or burning when urinating, or frequent urination) *BOWEL PROBLEMS (unusual diarrhea, constipation, pain near the anus) TENDERNESS IN MOUTH AND THROAT WITH OR WITHOUT PRESENCE OF ULCERS (sore throat, sores in mouth, or a toothache) UNUSUAL RASH, SWELLING OR PAIN  UNUSUAL VAGINAL DISCHARGE OR ITCHING   Items with * indicate a potential emergency and should be followed up as soon as possible or go to the Emergency Department if any problems should occur.  Please show the CHEMOTHERAPY ALERT CARD or IMMUNOTHERAPY  ALERT CARD at check-in to the Emergency Department and triage nurse.  Should you have questions after your visit or need to cancel or reschedule your appointment, please contact [facility] - A DEPT OF [facility]  [phone] and follow the prompts.  Office hours are 8:00 a.m. to 4:30 p.m. Monday - Friday. Please note that voicemails left after 4:00 p.m. may not be returned until the following business day.  We are closed weekends and major holidays. You have access to a nurse at all times for urgent questions. Please call the main number to the clinic [phone] and follow the prompts.  For any non-urgent questions, you may also contact your provider using MyChart. We now offer e-Visits for anyone 58 and older to request care online for non-urgent symptoms. For details visit [email or web address].   Also download the MyChart app! Go to the app store, search "MyChart", open the app, select [place], and log in with your MyChart username and password.

## 2024-09-23 NOTE — Progress Notes (Signed)
 Patient would like an RX for 800mg  ibuprofen  or tylenol  3 for shoulder/arm pain.

## 2024-09-23 NOTE — Progress Notes (Signed)
 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  Ultrasound of the left axilla demonstrated normal lymph nodes.  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  8 mm.  Tumor was ER/PR -0% and HER2 positive +3 by IHC.    Menarche at the age of 63.  She is G5, P5.  Age at first birth 14.  She has used birth control pills in the past.  She attained menopause in her 107s.  Family history significant for breast cancer in 2 sisters of her maternal grandmother.  Mom with cervical cancer in her 11s.  Paternal uncle with stomach cancer.  Her sister has been diagnosed with some form of skin cancer.   Left lumpectomy pathology from 11/04/2023 showed 10 mm grade 3 tumor with negative margins.  3 sentinel lymph nodes negative for malignancy.  Tumor was ER/PR negative and HER2 positive.  Patient had significant infusion reaction with first cycle of Taxol .  She  subsequently went on to do 9 cycles of weekly Abraxane  and Herceptin .  She had worsening neuropathy skin rash and edema with Abraxane  and was subsequently stopped.  She is currently undergoing adjuvant radiation therapy and receiving maintenance Herceptin     Interval history- [name] is a 63 year old female with early stage breast cancer who presents for follow-up on Herceptin  treatment.  She is nearing the completion of her one-year Herceptin  treatment, with three more treatments remaining, expected to finish in December. No adverse effects from Herceptin  have been reported.  She experiences significant neuropathy, primarily in her left foot, but also intermittently in her hands and right leg. The neuropathy varies in intensity, sometimes improving and sometimes worsening, but it is persistent in her left leg. No falls have occurred. She did not have neuropathy prior to treatment, except for occasional 'foot going to sleep.'  She was prescribed gabapentin  for neuropathy, initially at a low dose of 100 mg. She increased the dose to 300 mg but found it made her groggy, so she only takes it when she does not have to work the next morning. She has not been taking it consistently due to the side effects.  She reports muscle aches and soreness on her side. She takes ibuprofen  800 mg daily, usually around lunchtime, to manage the pain, especially during her 12-hour work shifts.  She recently purchased several vitamins, including D, K, B1, and C, for general health and muscle/joint support.  ECOG PS- 0 Pain scale- 0   Review of systems- Review of  Systems  Constitutional:  Negative for chills, fever, malaise/fatigue and weight loss.  HENT:  Negative for congestion, ear discharge and nosebleeds.   Eyes:  Negative for blurred vision.  Respiratory:  Negative for cough, hemoptysis, sputum production, shortness of breath and wheezing.   Cardiovascular:  Negative for chest pain,  palpitations, orthopnea and claudication.  Gastrointestinal:  Negative for abdominal pain, blood in stool, constipation, diarrhea, heartburn, melena, nausea and vomiting.  Genitourinary:  Negative for dysuria, flank pain, frequency, hematuria and urgency.  Musculoskeletal:  Negative for back pain, joint pain and myalgias.  Skin:  Negative for rash.  Neurological:  Positive for sensory change (Peripheral neuropathy). Negative for dizziness, tingling, focal weakness, seizures, weakness and headaches.  Endo/Heme/Allergies:  Does not bruise/bleed easily.  Psychiatric/Behavioral:  Negative for depression and suicidal ideas. The patient does not have insomnia.       Allergies  Allergen Reactions   Paclitaxel  Shortness Of Breath, Other (See Comments) and Hypertension    Redness to face/chest Back Pain     Flagyl [Metronidazole] Itching     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, estrogen receptor negative (HCC)    Obesity    Ovarian cyst    Palpitations    Personal history of chemotherapy    Personal history of radiation therapy    Sigmoid diverticulitis 08/2012   Subclinical hypothyroidism    SVT (supraventricular tachycardia)    on Holter monitor 5 beat run of svt   Thyroid  nodule    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   Type 2 diabetes mellitus (HCC)      Past Surgical History:  Procedure Laterality Date   AXILLARY SENTINEL NODE BIOPSY Left 11/04/2023   Procedure: AXILLARY SENTINEL NODE BIOPSY;  Surgeon: Lane Shope, MD;  Location: [facility] ORS;  Service: General;  Laterality: Left;   BLADDER SUSPENSION  2009   BREAST BIOPSY Left 10/22/2023   Us  Core Bx, heart clip - path pending    BREAST BIOPSY Left 10/22/2023   US  LT BREAST BX W LOC DEV 1ST LESION IMG BX SPEC US  GUIDE 10/22/2023 [facility]   BREAST LUMPECTOMY WITH RADIOFREQUENCY TAG IDENTIFICATION Left 11/04/2023   Procedure: BREAST LUMPECTOMY WITH RADIOFREQUENCY TAG IDENTIFICATION;  Surgeon: Lane Shope, MD;  Location: [facility] ORS;  Service: General;  Laterality: Left;   Colectomy Left    likely sigmoid, open.  for diverticulitis.   COLONOSCOPY WITH PROPOFOL  N/A 10/01/2022   Procedure: COLONOSCOPY WITH PROPOFOL ;  Surgeon: Unk Corinn Skiff, MD;  Location: [facility] ENDOS[facility];  Service: Gastroenterology;  Laterality: N/A;   PORTACATH PLACEMENT Right 11/04/2023   Procedure: INSERTION PORT-A-CATH;  Surgeon: Lane Shope, MD;  Location: [facility] ORS;  Service: General;  Laterality: Right;   TUBAL LIGATION     XI ROBOTIC LAPAROSCOPIC ASSISTED APPENDECTOMY N/A 12/29/2022   Procedure: XI ROBOTIC LYSIS OF ADHESIONS AND APPENDECTOMY;  Surgeon: Lane Shope, MD;  Location: [facility] ORS;  Service: General;  Laterality: N/A;    Social History   Socioeconomic History   Marital status: Single    Spouse name: Not on file   Number of children: Not on file   Years of education: Not on file   Highest education  level: Some college, no degree  Occupational History   Not on file  Tobacco Use   Smoking status: Former    Current packs/day: 0.00    Types: Cigarettes, Cigars    Quit date: 09/19/2011    Years since quitting: 13.0   Smokeless tobacco: Never  Vaping Use   Vaping status: Former  Substance and Sexual Activity   Alcohol use: Not Currently    Comment: Occasional glass of wine   Drug use: No   Sexual activity: Not Currently  Other Topics Concern   Not on file  Social History Narrative   Works in Audiological scientist at [employer].   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   Social Drivers of Corporate investment banker Strain: Low Risk  (06/17/2023)   Overall Financial Resource Strain (CARDIA)     Difficulty of Paying Living Expenses: Not very hard  Food Insecurity: No Food Insecurity (10/28/2023)   Hunger Vital Sign    Worried About Running Out of Food in the Last Year: Never true    Ran Out of Food in the Last Year: Never true  Transportation Needs: No Transportation Needs (10/28/2023)   PRAPARE - Administrator, Civil Service (Medical): No    Lack of Transportation (Non-Medical): No  Physical Activity: Sufficiently Active (06/17/2023)   Exercise Vital Sign    Days of Exercise per Week: 7 days    Minutes of Exercise per Session: 60 min  Stress: Stress Concern Present (06/17/2023)   [employer] of Occupational Health - Occupational Stress Questionnaire    Feeling of Stress : To some extent  Social Connections: Moderately Integrated (06/17/2023)   Social Connection and Isolation Panel    Frequency of Communication with Friends and Family: More than three times a week    Frequency of Social Gatherings with Friends and Family: Once a week    Attends Religious Services: More than 4 times per year    Active Member of [employer] or Organizations: Yes    Attends Banker Meetings: More than 4 times per year    Marital Status: Divorced  Intimate Partner Violence: Not At Risk (10/28/2023)   Humiliation, Afraid, Rape, and Kick questionnaire    Fear of Current or Ex-Partner: No    Emotionally Abused: No    Physically Abused: No    Sexually Abused: No    Family History  Problem Relation Age of Onset   Cancer - Cervical Mother        dx 28s; Passed away from Kidney Issues (Not Cancer)   Heart disease Father    Kidney disease Father    Diabetes Father    Skin cancer Sister    Stomach cancer Paternal Uncle    Diabetes Maternal Grandmother    Skin cancer Maternal Grandfather    Diabetes Paternal Grandmother    Breast cancer Other        mat great aunts x2; unk age of dx   Lung cancer Cousin    Brain cancer Cousin      Current Outpatient Medications:     acetaminophen  (TYLENOL ) 500 MG tablet, Take 500 mg by mouth every 6 (six) hours as needed., Disp: , Rfl:    atenolol  (TENORMIN ) 25 MG tablet, TAKE 1 TABLET (25 MG TOTAL) BY MOUTH DAILY. FOR PALPITATIONS., Disp: 90 tablet, Rfl: 0   Biotin 1000 MCG tablet, Take 1,000 mcg by mouth daily., Disp: , Rfl:    blood glucose meter kit and supplies  KIT, Dispense based on patient and insurance preference. Use up to four times daily as directed. (FOR ICD-9 250.00, 250.01)., Disp: 1 each, Rfl: 0   cyanocobalamin 500 MCG tablet, Take 1,000 mcg by mouth daily. Reported on 06/12/2016, Disp: , Rfl:    docusate sodium (COLACE) 100 MG capsule, Take 200 mg by mouth daily as needed for moderate constipation. Reported on 06/12/2016, Disp: , Rfl:    ibuprofen  (ADVIL ) 800 MG tablet, Take 1 tablet (800 mg total) by mouth every 8 (eight) hours as needed., Disp: 30 tablet, Rfl: 0   lidocaine -prilocaine  (EMLA ) cream, APPLY TO AFFECTED AREA ONCE AS DIRECTED, Disp: 30 g, Rfl: 3   metFORMIN  (GLUCOPHAGE -XR) 500 MG 24 hr tablet, TAKE 1 TABLET (500 MG TOTAL) BY MOUTH DAILY WITH BREAKFAST. FOR DIABETES., Disp: 90 tablet, Rfl: 1   pyridOXINE (VITAMIN B6) 25 MG tablet, Take 25 mg by mouth daily., Disp: , Rfl:    rosuvastatin  (CRESTOR ) 5 MG tablet, TAKE 1 TABLET (5 MG TOTAL) BY MOUTH EVERY EVENING. FOR CHOLESTEROL., Disp: 90 tablet, Rfl: 0   Semaglutide ,0.25 or 0.5MG /DOS, (OZEMPIC , 0.25 OR 0.5 MG/DOSE,) 2 MG/3ML SOPN, Inject 0.5 mg into the skin once a week. for diabetes., Disp: 9 mL, Rfl: 0   silver  sulfADIAZINE  (SILVADENE ) 1 % cream, Apply 1 Application topically 2 (two) times daily. (Patient not taking: Reported on 09/02/2024), Disp: 50 g, Rfl: 0 No current facility-administered medications for this visit.  Facility-Administered Medications Ordered in Other Visits:    0.9 %  sodium chloride  infusion, , Intravenous, Continuous, Melanee Annah BROCKS, MD, Stopped at 11/27/23 1158  Physical exam:  Vitals:   09/23/24 0837  BP: 108/64  Pulse: 80   Resp: 19  Temp: (!) 96.1 F (35.6 C)  TempSrc: Tympanic  SpO2: 98%  Weight: 203 lb 9.6 oz (92.4 kg)  Height: 5' 5 (1.651 m)   Physical Exam Cardiovascular:     Rate and Rhythm: Normal rate and regular rhythm.     Heart sounds: Normal heart sounds.  Pulmonary:     Effort: Pulmonary effort is normal.     Breath sounds: Normal breath sounds.  Skin:    General: Skin is warm and dry.  Neurological:     Mental Status: She is alert and oriented to person, place, and time.      I have personally reviewed labs listed below:    Latest Ref Rng & Units 09/23/2024    8:23 AM  CMP  Glucose 70 - 99 mg/dL 842   BUN 8 - 23 mg/dL 12   Creatinine 9.55 - 1.00 mg/dL 9.29   Sodium 864 - 854 mmol/L 137   Potassium 3.5 - 5.1 mmol/L 3.9   Chloride 98 - 111 mmol/L 106   CO2 22 - 32 mmol/L 23   Calcium  8.9 - 10.3 mg/dL 9.0   Total Protein 6.5 - 8.1 g/dL 7.0   Total Bilirubin 0.0 - 1.2 mg/dL 0.9   Alkaline Phos 38 - 126 U/L 86   AST 15 - 41 U/L 43   ALT 0 - 44 U/L 34       Latest Ref Rng & Units 08/12/2024    9:29 AM  CBC  WBC 4.0 - 10.5 K/uL 3.8   Hemoglobin 12.0 - 15.0 g/dL 86.8   Hematocrit 63.9 - 46.0 % 39.2   Platelets 150 - 400 K/uL 259    I have personally reviewed Radiology images listed below: No images are attached to the encounter.  DG Abd 2 Views  Result Date: 09/04/2024 CLINICAL DATA:  Left lower side pain. History of constipation and diverticulitis. EXAM: ABDOMEN - 2 VIEW COMPARISON:  CT 01/08/2023 FINDINGS: Diffuse gaseous distension of ascending, transverse, and descending colon. Colonic air-fluid levels noted on the upright view. No obvious free intra-abdominal air, please note the hemidiaphragms are not included in the field of view. No small bowel distension. No visible radiopaque calculi. Mild limited assessment, no acute osseous findings. IMPRESSION: Diffuse gaseous distension of colon with air-fluid levels, may represent colonic ileus or diarrheal process.  Electronically Signed   By: Andrea Gasman M.D.   On: 09/04/2024 14:28     Assessment and plan- Patient is a 63 y.o. 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 She is s/p 9 weekly cycles of Abraxane  and Herceptin  which was subsequently stopped due to intolerance.  She is here for on treatment assessment prior to cycle 13 of maintenance Herceptin   Stage I left breast cancer:Undergoing adjuvant trastuzumab  therapy, completing in December. No issues with therapy. Echocardiogram scheduled.  - Continue Herceptin  therapy as planned.   Chemotherapy-induced peripheral neuropathy Persistent neuropathy primarily in left foot, occasional in hands and legs. Gabapentin  not consistently used due to grogginess. Consistent use required for efficacy assessment. Alternatives include Lyrica  and Cymbalta. - Initiate gabapentin  200 mg at night and assess tolerance. - Consider Lyrica  or Cymbalta if gabapentin  is not tolerated.  Chronic pain after breast cancer treatment Chronic pain on surgery and radiation side. Ibuprofen  800 mg daily poses risks. Tylenol  safer but less effective. Non-pharmacological approaches emphasized. - Advise against daily use of ibuprofen  800 mg due to potential risks. - Encourage use of Tylenol  as a safer alternative. - Recommend focusing on flexibility exercises for long-term pain management.    Visit Diagnosis 1. 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 in female, estrogen receptor negative (HCC)   2. Encounter for monoclonal antibody treatment for malignancy   3. Chemotherapy-induced peripheral neuropathy      Dr. Annah Skene, MD, MPH [facility] [phone] 09/23/2024 9:14 AM

## 2024-09-27 ENCOUNTER — Ambulatory Visit
Admission: RE | Admit: 2024-09-27 | Discharge: 2024-09-27 | Disposition: A | Discharge status: Home / Self Care | Source: Ambulatory Visit | Attending: Oncology | Admitting: Oncology

## 2024-09-27 DIAGNOSIS — Z171 Estrogen receptor negative status [ER-]: Secondary | ICD-10-CM | POA: Insufficient documentation

## 2024-09-27 DIAGNOSIS — Z0189 Encounter for other specified special examinations: Secondary | ICD-10-CM | POA: Diagnosis not present

## 2024-09-27 DIAGNOSIS — I517 Cardiomegaly: Secondary | ICD-10-CM | POA: Diagnosis not present

## 2024-09-27 DIAGNOSIS — Z01818 Encounter for other preprocedural examination: Secondary | ICD-10-CM | POA: Diagnosis present

## 2024-09-27 DIAGNOSIS — C50312 Malignant neoplasm of lower-inner quadrant of left female breast: Secondary | ICD-10-CM | POA: Insufficient documentation

## 2024-09-27 LAB — ECHOCARDIOGRAM COMPLETE
AR max vel: 2.28 cm2
AV Area VTI: 2.11 cm2
AV Area mean vel: 2.17 cm2
AV Mean grad: 3 mmHg
AV Peak grad: 6.3 mmHg
Ao pk vel: 1.25 m/s
Area-P 1/2: 3.99 cm2
Calc EF: 55.8 %
MV VTI: 1.98 cm2
S' Lateral: 3.7 cm
Single Plane A2C EF: 54.2 %
Single Plane A4C EF: 54.9 %

## 2024-09-28 ENCOUNTER — Ambulatory Visit: Admitting: Primary Care

## 2024-09-28 ENCOUNTER — Encounter: Payer: Self-pay | Admitting: Primary Care

## 2024-09-28 VITALS — BP 138/76 | HR 93 | Temp 97.2°F | Ht 65.0 in | Wt 206.0 lb

## 2024-09-28 DIAGNOSIS — R1032 Left lower quadrant pain: Secondary | ICD-10-CM

## 2024-09-28 NOTE — Patient Instructions (Signed)
 You will receive a phone call today regarding the CT scan.  It was a pleasure to see you today!

## 2024-09-28 NOTE — Assessment & Plan Note (Addendum)
 Exam today most representative of hernia.  Unclear etiology of her side pain.   Differentials include hernia, diverticulitis. Need to rule out cancerous mass.  Given symptoms, will complete CT abdomen/pelvis with contrast for evaluation. She agrees. Orders placed  Reviewed recent CBC and CMP.  Await results.

## 2024-09-28 NOTE — Progress Notes (Signed)
 Subjective:    Patient ID: Rebecca Macias, female    DOB: 01-25-1961, 63 y.o.   MRN: [id number]  Rebecca Macias is a very pleasant 63 y.o. female with a history of GERD, type 2 diabetes, diverticulitis, subclinical hyperthyroidism, neuropathy, breast cancer who presents today to discuss abdominal mass.  Symptom onset 3 weeks ago with sensation of a knot to the left lower abdomen/left upper pelvis. She notices the knot when laying down and when cough. Prior to noticing the knot she had been coughing quite a bit from a cold.  She continues to notice left side pain for which she discussed a few weeks ago during her last visit.  She has bowel movements daily if she takes a stool softener. If she didn't take a stool softener she would have a bowel movement every 2-3 days. Her last bowel movement was this morning which was slow to move.   She denies pain, joint aches, fevers, chills, nausea, diarrhea, dysuria, pelvic pain. She has been off of her statin medication for  weeks and hasn't noticed improvement to her side pain   Review of Systems  Constitutional:  Negative for fever.  Gastrointestinal:  Positive for abdominal pain and constipation. Negative for diarrhea, nausea and vomiting.  Genitourinary:  Negative for dysuria and vaginal discharge.  Musculoskeletal:  Negative for arthralgias.         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, estrogen receptor negative (HCC)    Obesity    Ovarian cyst    Palpitations    Personal history of chemotherapy    Personal history of radiation therapy    Sigmoid diverticulitis 08/2012   Subclinical hypothyroidism    SVT (supraventricular  tachycardia)    on Holter monitor 5 beat run of svt   Thyroid  nodule    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   Type 2 diabetes mellitus (HCC)     Social History   Socioeconomic History   Marital status: Single    Spouse name: Not on file   Number of children: Not on file   Years of education: Not on file   Highest education level: Some college, no degree  Occupational History   Not on file  Tobacco Use   Smoking status: Former    Current packs/day: 0.00    Types: Cigarettes, Cigars    Quit date: 09/19/2011    Years since quitting: 13.0   Smokeless tobacco: Never  Vaping Use   Vaping status: Former  Substance and Sexual Activity   Alcohol use: Not Currently    Comment: Occasional glass of wine   Drug use: No   Sexual activity: Not Currently  Other Topics Concern   Not on file  Social History Narrative   Works in Audiological scientist at [employer].   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   Social Drivers of Corporate investment banker Strain: Low Risk  (06/17/2023)   Overall Financial Resource Strain (CARDIA)    Difficulty of Paying Living Expenses: Not very hard  Food Insecurity: No Food Insecurity (10/28/2023)   Hunger Vital Sign    Worried About Running Out of Food  in the Last Year: Never true    Ran Out of Food in the Last Year: Never true  Transportation Needs: No Transportation Needs (10/28/2023)   PRAPARE - Administrator, Civil Service (Medical): No    Lack of Transportation (Non-Medical): No  Physical Activity: Sufficiently Active (06/17/2023)   Exercise Vital Sign    Days of Exercise per Week: 7 days    Minutes of Exercise per Session: 60 min  Stress: Stress Concern Present (06/17/2023)   [employer] of Occupational Health - Occupational Stress Questionnaire    Feeling of Stress : To some extent  Social Connections: Moderately Integrated (06/17/2023)   Social Connection and Isolation Panel    Frequency of Communication with Friends and Family:  More than three times a week    Frequency of Social Gatherings with Friends and Family: Once a week    Attends Religious Services: More than 4 times per year    Active Member of [employer] or Organizations: Yes    Attends Banker Meetings: More than 4 times per year    Marital Status: Divorced  Intimate Partner Violence: Not At Risk (10/28/2023)   Humiliation, Afraid, Rape, and Kick questionnaire    Fear of Current or Ex-Partner: No    Emotionally Abused: No    Physically Abused: No    Sexually Abused: No    Past Surgical History:  Procedure Laterality Date   AXILLARY SENTINEL NODE BIOPSY Left 11/04/2023   Procedure: AXILLARY SENTINEL NODE BIOPSY;  Surgeon: Lane Shope, MD;  Location: [facility];  Service: General;  Laterality: Left;   BLADDER SUSPENSION  2009   BREAST BIOPSY Left 10/22/2023   Us  Core Bx, heart clip - path pending   BREAST BIOPSY Left 10/22/2023   US  LT BREAST BX W LOC DEV 1ST LESION IMG BX SPEC US  GUIDE 10/22/2023 [facility]   BREAST LUMPECTOMY WITH RADIOFREQUENCY TAG IDENTIFICATION Left 11/04/2023   Procedure: BREAST LUMPECTOMY WITH RADIOFREQUENCY TAG IDENTIFICATION;  Surgeon: Lane Shope, MD;  Location: [facility];  Service: General;  Laterality: Left;   Colectomy Left    likely sigmoid, open.  for diverticulitis.   COLONOSCOPY WITH PROPOFOL  N/A 10/01/2022   Procedure: COLONOSCOPY WITH PROPOFOL ;  Surgeon: Unk Corinn Skiff, MD;  Location: [facility];  Service: Gastroenterology;  Laterality: N/A;   PORTACATH PLACEMENT Right 11/04/2023   Procedure: INSERTION PORT-A-CATH;  Surgeon: Lane Shope, MD;  Location: [facility];  Service: General;  Laterality: Right;   TUBAL LIGATION     XI ROBOTIC LAPAROSCOPIC ASSISTED APPENDECTOMY N/A 12/29/2022   Procedure: XI ROBOTIC LYSIS OF ADHESIONS AND APPENDECTOMY;  Surgeon: Lane Shope, MD;  Location: [facility];  Service: General;  Laterality: N/A;    Family History  Problem Relation Age of  Onset   Cancer - Cervical Mother        dx 67s; Passed away from Kidney Issues (Not Cancer)   Heart disease Father    Kidney disease Father    Diabetes Father    Skin cancer Sister    Stomach cancer Paternal Uncle    Diabetes Maternal Grandmother    Skin cancer Maternal Grandfather    Diabetes Paternal Grandmother    Breast cancer Other        mat great aunts x2; unk age of dx   Lung cancer Cousin    Brain cancer Cousin     Allergies  Allergen Reactions   Paclitaxel  Shortness Of Breath, Other (See Comments) and Hypertension    Redness to  face/chest Back Pain     Flagyl [Metronidazole] Itching    Current Outpatient Medications on File Prior to Visit  Medication Sig Dispense Refill   acetaminophen  (TYLENOL ) 500 MG tablet Take 500 mg by mouth every 6 (six) hours as needed.     atenolol  (TENORMIN ) 25 MG tablet TAKE 1 TABLET (25 MG TOTAL) BY MOUTH DAILY. FOR PALPITATIONS. 90 tablet 0   Biotin 1000 MCG tablet Take 1,000 mcg by mouth daily.     blood glucose meter kit and supplies KIT Dispense based on patient and insurance preference. Use up to four times daily as directed. (FOR ICD-9 250.00, 250.01). 1 each 0   cyanocobalamin 500 MCG tablet Take 1,000 mcg by mouth daily. Reported on 06/12/2016     docusate sodium (COLACE) 100 MG capsule Take 200 mg by mouth daily as needed for moderate constipation. Reported on 06/12/2016     ibuprofen  (ADVIL ) 800 MG tablet Take 1 tablet (800 mg total) by mouth every 8 (eight) hours as needed. 30 tablet 0   lidocaine -prilocaine  (EMLA ) cream APPLY TO AFFECTED AREA ONCE AS DIRECTED 30 g 3   metFORMIN  (GLUCOPHAGE -XR) 500 MG 24 hr tablet TAKE 1 TABLET (500 MG TOTAL) BY MOUTH DAILY WITH BREAKFAST. FOR DIABETES. 90 tablet 1   pyridOXINE (VITAMIN B6) 25 MG tablet Take 25 mg by mouth daily.     Semaglutide ,0.25 or 0.5MG /DOS, (OZEMPIC , 0.25 OR 0.5 MG/DOSE,) 2 MG/3ML SOPN Inject 0.5 mg into the skin once a week. for diabetes. 9 mL 0   rosuvastatin  (CRESTOR ) 5 MG  tablet TAKE 1 TABLET (5 MG TOTAL) BY MOUTH EVERY EVENING. FOR CHOLESTEROL. (Patient not taking: Reported on 09/28/2024) 90 tablet 0   silver  sulfADIAZINE  (SILVADENE ) 1 % cream Apply 1 Application topically 2 (two) times daily. (Patient not taking: Reported on 09/28/2024) 50 g 0   Current Facility-Administered Medications on File Prior to Visit  Medication Dose Route Frequency Provider Last Rate Last Admin   0.9 %  sodium chloride  infusion   Intravenous Continuous Melanee Annah BROCKS, MD   Stopped at 11/27/23 1158    BP 138/76   Pulse 93   Temp (!) 97.2 F (36.2 C) (Temporal)   Ht 5' 5 (1.651 m)   Wt 206 lb (93.4 kg)   LMP 11/03/2012   SpO2 99%   BMI 34.28 kg/m  Objective:   Physical Exam Cardiovascular:     Rate and Rhythm: Normal rate and regular rhythm.  Pulmonary:     Effort: Pulmonary effort is normal.     Breath sounds: Normal breath sounds.  Abdominal:     General: Bowel sounds are normal.     Palpations: Abdomen is soft.     Tenderness: There is no abdominal tenderness.      Comments: Moderately sized soft, nontender mass noted to left lower umbilical region.  Musculoskeletal:     Cervical back: Neck supple.  Skin:    General: Skin is warm and dry.  Neurological:     Mental Status: She is alert and oriented to person, place, and time.  Psychiatric:        Mood and Affect: Mood normal.     Physical Exam        Assessment & Plan:  Left lower quadrant abdominal pain Assessment & Plan: Exam today most representative of hernia.  Unclear etiology of her side pain.   Differentials include hernia, diverticulitis. Need to rule out cancerous mass.  Given symptoms, will complete CT abdomen/pelvis with contrast for evaluation.  She agrees. Orders placed  Reviewed recent CBC and CMP.  Await results.   Orders: -     CT ABDOMEN PELVIS W CONTRAST; Future    Assessment and Plan Assessment & Plan         Comer MARLA Gaskins, NP     History of Present  Illness

## 2024-09-30 ENCOUNTER — Ambulatory Visit
Admission: RE | Admit: 2024-09-30 | Discharge: 2024-09-30 | Disposition: A | Discharge status: Home / Self Care | Source: Ambulatory Visit | Attending: Oncology | Admitting: Oncology

## 2024-09-30 ENCOUNTER — Encounter: Payer: Self-pay | Admitting: Oncology

## 2024-09-30 ENCOUNTER — Ambulatory Visit
Admission: RE | Admit: 2024-09-30 | Discharge: 2024-09-30 | Disposition: A | Discharge status: Home / Self Care | Source: Ambulatory Visit | Attending: Primary Care | Admitting: Primary Care

## 2024-09-30 ENCOUNTER — Encounter: Payer: Self-pay | Admitting: Internal Medicine

## 2024-09-30 DIAGNOSIS — R1032 Left lower quadrant pain: Secondary | ICD-10-CM | POA: Insufficient documentation

## 2024-09-30 DIAGNOSIS — C50312 Malignant neoplasm of lower-inner quadrant of left female breast: Secondary | ICD-10-CM | POA: Diagnosis present

## 2024-09-30 DIAGNOSIS — Z171 Estrogen receptor negative status [ER-]: Secondary | ICD-10-CM | POA: Insufficient documentation

## 2024-09-30 MED ORDER — IOHEXOL 9 MG/ML PO SOLN
500.0000 mL | ORAL | Status: AC
  Administered 2024-09-30 (×2): 500 mL via ORAL

## 2024-09-30 MED ORDER — IOHEXOL 300 MG/ML  SOLN
100.0000 mL | Freq: Once | INTRAMUSCULAR | Status: AC | PRN
  Administered 2024-09-30: 100 mL via INTRAVENOUS

## 2024-10-02 ENCOUNTER — Ambulatory Visit: Payer: Self-pay | Admitting: Primary Care

## 2024-10-14 ENCOUNTER — Inpatient Hospital Stay

## 2024-10-14 ENCOUNTER — Encounter: Payer: Self-pay | Admitting: Oncology

## 2024-10-14 ENCOUNTER — Inpatient Hospital Stay: Attending: Oncology | Admitting: Oncology

## 2024-10-14 VITALS — BP 124/66 | HR 83 | Temp 97.8°F | Resp 19 | Ht 65.0 in | Wt 208.2 lb

## 2024-10-14 VITALS — BP 107/59 | HR 82

## 2024-10-14 DIAGNOSIS — Z800 Family history of malignant neoplasm of digestive organs: Secondary | ICD-10-CM

## 2024-10-14 DIAGNOSIS — Z801 Family history of malignant neoplasm of trachea, bronchus and lung: Secondary | ICD-10-CM

## 2024-10-14 DIAGNOSIS — Z5112 Encounter for antineoplastic immunotherapy: Secondary | ICD-10-CM | POA: Diagnosis present

## 2024-10-14 DIAGNOSIS — Z79899 Other long term (current) drug therapy: Secondary | ICD-10-CM | POA: Diagnosis not present

## 2024-10-14 DIAGNOSIS — Z171 Estrogen receptor negative status [ER-]: Secondary | ICD-10-CM | POA: Insufficient documentation

## 2024-10-14 DIAGNOSIS — Z1722 Progesterone receptor negative status: Secondary | ICD-10-CM | POA: Insufficient documentation

## 2024-10-14 DIAGNOSIS — Z808 Family history of malignant neoplasm of other organs or systems: Secondary | ICD-10-CM

## 2024-10-14 DIAGNOSIS — Z1731 Human epidermal growth factor receptor 2 positive status: Secondary | ICD-10-CM | POA: Diagnosis not present

## 2024-10-14 DIAGNOSIS — C50312 Malignant neoplasm of lower-inner quadrant of left female breast: Secondary | ICD-10-CM | POA: Diagnosis present

## 2024-10-14 DIAGNOSIS — Z803 Family history of malignant neoplasm of breast: Secondary | ICD-10-CM

## 2024-10-14 MED ORDER — DIPHENHYDRAMINE HCL 25 MG PO CAPS
25.0000 mg | ORAL_CAPSULE | Freq: Once | ORAL | Status: AC
  Administered 2024-10-14: 25 mg via ORAL
  Filled 2024-10-14: qty 1

## 2024-10-14 MED ORDER — SODIUM CHLORIDE 0.9 % IV SOLN
INTRAVENOUS | Status: DC
  Filled 2024-10-14: qty 250

## 2024-10-14 MED ORDER — ACETAMINOPHEN 325 MG PO TABS
650.0000 mg | ORAL_TABLET | Freq: Once | ORAL | Status: AC
  Administered 2024-10-14: 650 mg via ORAL
  Filled 2024-10-14: qty 2

## 2024-10-14 MED ORDER — TRASTUZUMAB-ANNS CHEMO 150 MG IV SOLR
6.0000 mg/kg | Freq: Once | INTRAVENOUS | Status: AC
  Administered 2024-10-14: 600 mg via INTRAVENOUS
  Filled 2024-10-14: qty 28.57

## 2024-10-14 NOTE — Progress Notes (Signed)
 Patient doing well. She has questions regarding reoccurrence.

## 2024-10-14 NOTE — Progress Notes (Signed)
 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  Ultrasound of the left axilla demonstrated normal lymph nodes.  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  8 mm.  Tumor was ER/PR -0% and HER2 positive +3 by IHC.    Menarche at the age of 72.  She is G5, P5.  Age at first birth 57.  She has used birth control pills in the past.  She attained menopause in her 37s.  Family history significant for breast cancer in 2 sisters of her maternal grandmother.  Mom with cervical cancer in her 19s.  Paternal uncle with stomach cancer.  Her sister has been diagnosed with some form of skin cancer.   Left lumpectomy pathology from 11/04/2023 showed 10 mm grade 3 tumor with negative margins.  3 sentinel lymph nodes negative for malignancy.  Tumor was ER/PR negative and HER2 positive.  Patient had significant infusion reaction with first cycle of Taxol .  She  subsequently went on to do 9 cycles of weekly Abraxane  and Herceptin .  She had worsening neuropathy skin rash and edema with Abraxane  and was subsequently stopped.  She is currently undergoing adjuvant radiation therapy and receiving maintenance Herceptin   Interval history-she is doing well presently and denies any complaints today  ECOG PS- 0 Pain scale- 0   Review of systems- Review of Systems  Constitutional:  Negative for chills, fever, malaise/fatigue and weight loss.  HENT:  Negative for congestion, ear discharge and nosebleeds.   Eyes:  Negative for blurred vision.  Respiratory:  Negative for cough, hemoptysis, sputum production, shortness of breath and wheezing.   Cardiovascular:  Negative for chest pain, palpitations, orthopnea and claudication.  Gastrointestinal:  Negative for abdominal pain, blood in stool, constipation, diarrhea, heartburn, melena, nausea and vomiting.  Genitourinary:  Negative for dysuria, flank pain, frequency, hematuria and urgency.  Musculoskeletal:  Negative for back pain, joint pain and myalgias.  Skin:  Negative for rash.  Neurological:  Negative for dizziness, tingling, focal weakness, seizures, weakness and headaches.  Endo/Heme/Allergies:  Does not bruise/bleed easily.  Psychiatric/Behavioral:  Negative for depression and suicidal ideas. The patient does not have insomnia.       Allergies  Allergen Reactions   Paclitaxel  Shortness Of Breath, Other (See Comments) and Hypertension    Redness to face/chest Back Pain     Flagyl [Metronidazole] Itching     Past Medical History:  Diagnosis Date   Adenoma of appendix    Anemia    Chronic lower back pain    GERD (gastroesophageal reflux  disease)    H/O gestational diabetes mellitus, not currently pregnant    History of kidney stones    Hypercholesteremia    Hyperplastic rectal polyp    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, estrogen receptor negative (HCC)    Obesity     Ovarian cyst    Palpitations    Personal history of chemotherapy    Personal history of radiation therapy    Sigmoid diverticulitis 08/2012   Subclinical hypothyroidism    SVT (supraventricular tachycardia)    on Holter monitor 5 beat run of svt   Thyroid  nodule    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   Type 2 diabetes mellitus (HCC)      Past Surgical History:  Procedure Laterality Date   AXILLARY SENTINEL NODE BIOPSY Left 11/04/2023   Procedure: AXILLARY SENTINEL NODE BIOPSY;  Surgeon: Lane Shope, MD;  Location: [facility] ORS;  Service: General;  Laterality: Left;   BLADDER SUSPENSION  2009   BREAST BIOPSY Left 10/22/2023   Us  Core Bx, heart clip - path pending   BREAST BIOPSY Left 10/22/2023   US  LT BREAST BX W LOC DEV 1ST LESION IMG BX SPEC US  GUIDE 10/22/2023 [facility]   BREAST LUMPECTOMY WITH RADIOFREQUENCY TAG IDENTIFICATION Left 11/04/2023   Procedure: BREAST LUMPECTOMY WITH RADIOFREQUENCY TAG IDENTIFICATION;  Surgeon: Lane Shope, MD;  Location: [facility] ORS;  Service: General;  Laterality: Left;   Colectomy Left    likely sigmoid, open.  for diverticulitis.   COLONOSCOPY WITH PROPOFOL  N/A 10/01/2022   Procedure: COLONOSCOPY WITH PROPOFOL ;  Surgeon: Unk Corinn Skiff, MD;  Location: [facility] ENDOSCOPY;  Service: Gastroenterology;  Laterality: N/A;   PORTACATH PLACEMENT Right 11/04/2023   Procedure: INSERTION PORT-A-CATH;  Surgeon: Lane Shope, MD;  Location: [facility] ORS;  Service: General;  Laterality: Right;   TUBAL LIGATION     XI ROBOTIC LAPAROSCOPIC ASSISTED APPENDECTOMY N/A 12/29/2022   Procedure: XI ROBOTIC LYSIS OF ADHESIONS AND APPENDECTOMY;  Surgeon: Lane Shope, MD;  Location: [facility] ORS;  Service: General;  Laterality: N/A;    Social History   Socioeconomic History   Marital status: Single    Spouse name: Not on file   Number of children: Not on file   Years of education: Not on file   Highest education level: Some college, no degree  Occupational  History   Not on file  Tobacco Use   Smoking status: Former    Current packs/day: 0.00    Types: Cigarettes, Cigars    Quit date: 09/19/2011    Years since quitting: 13.0   Smokeless tobacco: Never  Vaping Use   Vaping status: Former  Substance and Sexual Activity   Alcohol use: Not Currently    Comment: Occasional glass of wine   Drug use: No   Sexual activity: Not Currently  Other Topics Concern   Not on file  Social History Narrative   Works in audiological scientist at [employer].   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   Social Drivers of Corporate Investment Banker Strain: Low Risk  (06/17/2023)   Overall Financial Resource Strain (CARDIA)    Difficulty of Paying Living Expenses: Not very hard  Food Insecurity: No Food Insecurity (10/28/2023)   Hunger Vital Sign    Worried About Running Out of Food in the Last Year: Never true    Ran Out of Food in the Last Year: Never true  Transportation Needs: No Transportation Needs (10/28/2023)   PRAPARE - Administrator, Civil Service (  Medical): No    Lack of Transportation (Non-Medical): No  Physical Activity: Sufficiently Active (06/17/2023)   Exercise Vital Sign    Days of Exercise per Week: 7 days    Minutes of Exercise per Session: 60 min  Stress: Stress Concern Present (06/17/2023)   [employer] of Occupational Health - Occupational Stress Questionnaire    Feeling of Stress : To some extent  Social Connections: Moderately Integrated (06/17/2023)   Social Connection and Isolation Panel    Frequency of Communication with Friends and Family: More than three times a week    Frequency of Social Gatherings with Friends and Family: Once a week    Attends Religious Services: More than 4 times per year    Active Member of [employer] or Organizations: Yes    Attends Banker Meetings: More than 4 times per year    Marital Status: Divorced  Intimate Partner Violence: Not At Risk (10/28/2023)   Humiliation,  Afraid, Rape, and Kick questionnaire    Fear of Current or Ex-Partner: No    Emotionally Abused: No    Physically Abused: No    Sexually Abused: No    Family History  Problem Relation Age of Onset   Cancer - Cervical Mother        dx 4s; Passed away from Kidney Issues (Not Cancer)   Heart disease Father    Kidney disease Father    Diabetes Father    Skin cancer Sister    Stomach cancer Paternal Uncle    Diabetes Maternal Grandmother    Skin cancer Maternal Grandfather    Diabetes Paternal Grandmother    Breast cancer Other        mat great aunts x2; unk age of dx   Lung cancer Cousin    Brain cancer Cousin      Current Outpatient Medications:    acetaminophen  (TYLENOL ) 500 MG tablet, Take 500 mg by mouth every 6 (six) hours as needed., Disp: , Rfl:    atenolol  (TENORMIN ) 25 MG tablet, TAKE 1 TABLET (25 MG TOTAL) BY MOUTH DAILY. FOR PALPITATIONS., Disp: 90 tablet, Rfl: 0   Biotin 1000 MCG tablet, Take 1,000 mcg by mouth daily., Disp: , Rfl:    blood glucose meter kit and supplies KIT, Dispense based on patient and insurance preference. Use up to four times daily as directed. (FOR ICD-9 250.00, 250.01)., Disp: 1 each, Rfl: 0   cyanocobalamin 500 MCG tablet, Take 1,000 mcg by mouth daily. Reported on 06/12/2016, Disp: , Rfl:    docusate sodium (COLACE) 100 MG capsule, Take 200 mg by mouth daily as needed for moderate constipation. Reported on 06/12/2016, Disp: , Rfl:    gabapentin  (NEURONTIN ) 300 MG capsule, Take 300 mg by mouth at bedtime., Disp: , Rfl:    ibuprofen  (ADVIL ) 800 MG tablet, Take 1 tablet (800 mg total) by mouth every 8 (eight) hours as needed., Disp: 30 tablet, Rfl: 0   lidocaine -prilocaine  (EMLA ) cream, APPLY TO AFFECTED AREA ONCE AS DIRECTED, Disp: 30 g, Rfl: 3   metFORMIN  (GLUCOPHAGE -XR) 500 MG 24 hr tablet, TAKE 1 TABLET (500 MG TOTAL) BY MOUTH DAILY WITH BREAKFAST. FOR DIABETES., Disp: 90 tablet, Rfl: 1   pyridOXINE (VITAMIN B6) 25 MG tablet, Take 25 mg by mouth  daily., Disp: , Rfl:    Semaglutide ,0.25 or 0.5MG /DOS, (OZEMPIC , 0.25 OR 0.5 MG/DOSE,) 2 MG/3ML SOPN, Inject 0.5 mg into the skin once a week. for diabetes., Disp: 9 mL, Rfl: 0   rosuvastatin  (CRESTOR ) 5 MG  tablet, TAKE 1 TABLET (5 MG TOTAL) BY MOUTH EVERY EVENING. FOR CHOLESTEROL. (Patient not taking: Reported on 09/28/2024), Disp: 90 tablet, Rfl: 0   silver  sulfADIAZINE  (SILVADENE ) 1 % cream, Apply 1 Application topically 2 (two) times daily. (Patient not taking: Reported on 09/28/2024), Disp: 50 g, Rfl: 0 No current facility-administered medications for this visit.  Facility-Administered Medications Ordered in Other Visits:    0.9 %  sodium chloride  infusion, , Intravenous, Continuous, Melanee Annah BROCKS, MD, Stopped at 11/27/23 1158   0.9 %  sodium chloride  infusion, , Intravenous, Continuous, Melanee Annah BROCKS, MD, Stopped at 10/14/24 1145  Physical exam:  Vitals:   10/14/24 0943  BP: 124/66  Pulse: 83  Resp: 19  Temp: 97.8 F (36.6 C)  TempSrc: Tympanic  SpO2: 99%  Weight: 208 lb 3.2 oz (94.4 kg)  Height: 5' 5 (1.651 m)   Physical Exam Cardiovascular:     Rate and Rhythm: Normal rate and regular rhythm.     Heart sounds: Normal heart sounds.  Pulmonary:     Effort: Pulmonary effort is normal.     Breath sounds: Normal breath sounds.  Skin:    General: Skin is warm and dry.  Neurological:     Mental Status: She is alert and oriented to person, place, and time.      I have personally reviewed labs listed below:    Latest Ref Rng & Units 09/23/2024    8:23 AM  CMP  Glucose 70 - 99 mg/dL 842   BUN 8 - 23 mg/dL 12   Creatinine 9.55 - 1.00 mg/dL 9.29   Sodium 864 - 854 mmol/L 137   Potassium 3.5 - 5.1 mmol/L 3.9   Chloride 98 - 111 mmol/L 106   CO2 22 - 32 mmol/L 23   Calcium  8.9 - 10.3 mg/dL 9.0   Total Protein 6.5 - 8.1 g/dL 7.0   Total Bilirubin 0.0 - 1.2 mg/dL 0.9   Alkaline Phos 38 - 126 U/L 86   AST 15 - 41 U/L 43   ALT 0 - 44 U/L 34       Latest Ref Rng &  Units 09/23/2024    8:23 AM  CBC  WBC 4.0 - 10.5 K/uL 4.8   Hemoglobin 12.0 - 15.0 g/dL 86.6   Hematocrit 63.9 - 46.0 % 39.2   Platelets 150 - 400 K/uL 255    I have personally reviewed Radiology images listed below: No images are attached to the encounter.  CT ABDOMEN PELVIS W CONTRAST Result Date: 09/30/2024 CLINICAL DATA:  LLQ abdominal pain EXAM: CT ABDOMEN AND PELVIS WITH CONTRAST TECHNIQUE: Multidetector CT imaging of the abdomen and pelvis was performed using the standard protocol following bolus administration of intravenous contrast. RADIATION DOSE REDUCTION: This exam was performed according to the departmental dose-optimization program which includes automated exposure control, adjustment of the mA and/or kV according to patient size and/or use of iterative reconstruction technique. CONTRAST:  OMNIPAQUE  IOHEXOL  300 MG/ML  SOLN COMPARISON:  01/08/2023 FINDINGS: Lower chest: No focal airspace consolidation or pleural effusion. Hepatobiliary: No mass.Diffuse hepatic steatosis.Decompressed gallbladder without radiopaque stones or wall thickening. No intrahepatic or extrahepatic biliary ductal dilation. The portal veins are patent. Pancreas: No mass or main ductal dilation. No peripancreatic inflammation or fluid collection. Spleen: Normal size. No mass. Spontaneous splenorenal shunt, unchanged. Adrenals/Urinary Tract: No adrenal masses. Subcentimeter hypodensity noted in the right kidney, too small to definitively characterize, but likely a small cyst. no nephrolithiasis or hydronephrosis. The urinary bladder  is distended without focal abnormality. Stomach/Bowel: The stomach is decompressed without focal abnormality. 3.5 cm periampullary duodenum diverticulum. No small bowel wall thickening or inflammation. No small bowel obstruction.Appendectomy. Descending and sigmoid colonic diverticulosis. No changes of acute diverticulitis. Sigmoid anastomosis. Vascular/Lymphatic: No aortic aneurysm.  Diffuse aortoiliac atherosclerosis. No intraabdominal or pelvic lymphadenopathy. Reproductive: Age-related atrophy of the uterus and ovaries. No concerning adnexal mass.No free pelvic fluid. Other: No pneumoperitoneum, ascites, or mesenteric inflammation. Musculoskeletal: No acute fracture or destructive lesion. Small fat containing left paraumbilical ventral hernia. No findings of vascular compromise. Diffuse osteopenia. Multilevel degenerative disc disease of the spine. Thoracic DISH. IMPRESSION: 1. No acute intra-abdominal or pelvic abnormality. 2. Periampullary duodenum diverticulum measuring 3.5 cm. Descending and sigmoid colonic diverticulosis. No changes of acute diverticulitis. 3. Diffuse hepatic steatosis. Aortic Atherosclerosis (ICD10-I70.0). Electronically Signed   By: Rogelia Myers M.D.   On: 09/30/2024 14:36   MM 3D DIAGNOSTIC MAMMOGRAM BILATERAL BREAST Result Date: 09/30/2024 CLINICAL DATA:  History of a left lumpectomy for breast carcinoma performed on 11/04/2023, treated with adjuvant radiation therapy. Annual mammographic surveillance. EXAM: DIGITAL DIAGNOSTIC BILATERAL MAMMOGRAM WITH TOMOSYNTHESIS AND CAD TECHNIQUE: Bilateral digital diagnostic mammography and breast tomosynthesis was performed. The images were evaluated with computer-aided detection. COMPARISON:  Previous exam(s). ACR Breast Density Category b: There are scattered areas of fibroglandular density. FINDINGS: There is post lumpectomy scarring in the retroareolar left breast as well as more diffuse left breast trabecular thickening and overlying skin thickening consistent with treatment induced edema. There are no breast masses, areas of nonsurgical architectural distortion, areas of significant asymmetry or suspicious calcifications. IMPRESSION: 1. No evidence of new or recurrent breast malignancy. 2. Benign post lumpectomy/treatment related changes on the left. RECOMMENDATION: 1. Diagnostic mammography in 1 year per standard  post lumpectomy protocol. I have discussed the findings and recommendations with the patient. If applicable, a reminder letter will be sent to the patient regarding the next appointment. BI-RADS CATEGORY  2: Benign. Electronically Signed   By: Alm Parkins M.D.   On: 09/30/2024 10:02   ECHOCARDIOGRAM COMPLETE Result Date: 09/27/2024    ECHOCARDIOGRAM REPORT   Patient Name:   DALMA PANCHAL Carda Date of Exam: 09/27/2024 Medical Rec #:  [id number]       Height:       65.0 in Accession #:    [id number]      Weight:       203.6 lb Date of Birth:  [birth year]       BSA:          1.993 m Patient Age:    63 years        BP:           115/69 mmHg Patient Gender: F               HR:           87 bpm. Exam Location:  [facility] Procedure: 2D Echo, Cardiac Doppler, Color Doppler and Strain Analysis (Both            Spectral and Color Flow Doppler were utilized during procedure). Indications:     Malignant neoplasm of lower-inner quadrant of left female                  breast [facility]) [[id number]]  History:         Patient has prior history of Echocardiogram examinations, most                  recent 07/05/2024.  Sonographer:     Rosina Dunk Referring Phys:  [id number] ANNAH BROCKS Elye Harmsen Diagnosing Phys: Lonni Hanson MD  Sonographer Comments: Global longitudinal strain was attempted. IMPRESSIONS  1. Left ventricular ejection fraction, by estimation, is 55 to 60%. The left ventricle has normal function. The left ventricle has no regional wall motion abnormalities. There is mild asymmetric left ventricular hypertrophy of the infero-lateral segment. Left ventricular diastolic parameters are consistent with Grade I diastolic dysfunction (impaired relaxation).  2. Right ventricular systolic function is normal. The right ventricular size is normal.  3. The mitral valve is degenerative. Trivial mitral valve regurgitation. No evidence of mitral stenosis.  4. The aortic valve was not well visualized. Aortic valve regurgitation is not visualized.  No aortic stenosis is present.  5. The inferior vena cava is normal in size with greater than 50% respiratory variability, suggesting right atrial pressure of 3 mmHg. FINDINGS  Left Ventricle: Left ventricular ejection fraction, by estimation, is 55 to 60%. The left ventricle has normal function. The left ventricle has no regional wall motion abnormalities. The left ventricular internal cavity size was normal in size. There is  mild asymmetric left ventricular hypertrophy of the infero-lateral segment. Left ventricular diastolic parameters are consistent with Grade I diastolic dysfunction (impaired relaxation). Right Ventricle: The right ventricular size is normal. No increase in right ventricular wall thickness. Right ventricular systolic function is normal. Left Atrium: Left atrial size was normal in size. Right Atrium: Right atrial size was normal in size. Pericardium: There is no evidence of pericardial effusion. Mitral Valve: The mitral valve is degenerative in appearance. There is mild thickening of the mitral valve leaflet(s). There is mild calcification of the mitral valve leaflet(s). Mild mitral annular calcification. Trivial mitral valve regurgitation. No evidence of mitral valve stenosis. MV peak gradient, 4.9 mmHg. The mean mitral valve gradient is 3.0 mmHg. Tricuspid Valve: The tricuspid valve is not well visualized. Tricuspid valve regurgitation is trivial. Aortic Valve: The aortic valve was not well visualized. Aortic valve regurgitation is not visualized. No aortic stenosis is present. Aortic valve mean gradient measures 3.0 mmHg. Aortic valve peak gradient measures 6.2 mmHg. Aortic valve area, by VTI measures 2.11 cm. Pulmonic Valve: The pulmonic valve was not well visualized. Pulmonic valve regurgitation is not visualized. No evidence of pulmonic stenosis. Aorta: The aortic root and ascending aorta are structurally normal, with no evidence of dilitation. Pulmonary Artery: The pulmonary artery is  of normal size. Venous: The inferior vena cava is normal in size with greater than 50% respiratory variability, suggesting right atrial pressure of 3 mmHg. IAS/Shunts: The interatrial septum was not well visualized.  LEFT VENTRICLE PLAX 2D LVIDd:         4.30 cm     Diastology LVIDs:         3.70 cm     LV e' medial:    6.53 cm/s LV PW:         1.12 cm     LV E/e' medial:  14.0 LV IVS:        0.80 cm     LV e' lateral:   9.79 cm/s LVOT diam:     1.80 cm     LV E/e' lateral: 9.3 LV SV:         55 LV SV Index:   28 LVOT Area:     2.54 cm  LV Volumes (MOD) LV vol d, MOD A2C: 62.7 ml LV vol d, MOD A4C: 85.0 ml LV vol s, MOD A2C:  28.7 ml LV vol s, MOD A4C: 38.3 ml LV SV MOD A2C:     34.0 ml LV SV MOD A4C:     85.0 ml LV SV MOD BP:      42.2 ml RIGHT VENTRICLE RV Basal diam:  3.80 cm RV S prime:     12.10 cm/s TAPSE (M-mode): 2.6 cm LEFT ATRIUM             Index        RIGHT ATRIUM           Index LA diam:        3.60 cm 1.81 cm/m   RA Area:     15.20 cm LA Vol (A2C):   59.6 ml 29.90 ml/m  RA Volume:   35.80 ml  17.96 ml/m LA Vol (A4C):   55.2 ml 27.69 ml/m LA Biplane Vol: 58.1 ml 29.15 ml/m  AORTIC VALVE                    PULMONIC VALVE AV Area (Vmax):    2.28 cm     PV Vmax:        1.05 m/s AV Area (Vmean):   2.17 cm     PV Vmean:       71.800 cm/s AV Area (VTI):     2.11 cm     PV VTI:         0.205 m AV Vmax:           125.00 cm/s  PV Peak grad:   4.4 mmHg AV Vmean:          85.100 cm/s  PV Mean grad:   2.0 mmHg AV VTI:            0.262 m      RVOT Peak grad: 2 mmHg AV Peak Grad:      6.2 mmHg AV Mean Grad:      3.0 mmHg LVOT Vmax:         112.00 cm/s LVOT Vmean:        72.700 cm/s LVOT VTI:          0.217 m LVOT/AV VTI ratio: 0.83  AORTA Ao Root diam: 3.00 cm Ao Asc diam:  3.00 cm MITRAL VALVE MV Area (PHT): 3.99 cm     SHUNTS MV Area VTI:   1.98 cm     Systemic VTI:  0.22 m MV Peak grad:  4.9 mmHg     Systemic Diam: 1.80 cm MV Mean grad:  3.0 mmHg     Pulmonic VTI:  0.179 m MV Vmax:       1.11 m/s MV  Vmean:      86.8 cm/s MV Decel Time: 190 msec MV E velocity: 91.30 cm/s MV A velocity: 106.00 cm/s MV E/A ratio:  0.86 Christopher End MD Electronically signed by Lonni Hanson MD Signature Date/Time: 09/27/2024/7:40:32 PM    Final      Assessment and plan- Patient is a 63 y.o. 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 She is s/p 9 weekly cycles of Abraxane  and Herceptin  which was subsequently stopped due to intolerance.  She is here for on treatment assessment prior to cycle 14 of maintenance Herceptin   Counts okay to proceed with cycle 14 of maintenance Herceptin  today.  Will see her back in 4 weeks for cycle 15 since we are closed for  the day after Thanksgiving and that is  patient's preference as well.  Patient has ER-negative disease and therefore is not presently on endocrine therapy   Visit Diagnosis 1. 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 in female, estrogen receptor negative (HCC)   2. Encounter for monoclonal antibody treatment for malignancy      Dr. Annah Skene, MD, MPH [facility] at [facility] [phone] 10/14/2024 12:44 PM

## 2024-10-19 ENCOUNTER — Ambulatory Visit
Admission: RE | Admit: 2024-10-19 | Discharge: 2024-10-19 | Disposition: A | Discharge status: Home / Self Care | Source: Ambulatory Visit | Attending: Radiation Oncology | Admitting: Radiation Oncology

## 2024-10-19 VITALS — BP 122/76 | HR 101 | Temp 98.6°F | Resp 16 | Wt 212.0 lb

## 2024-10-19 DIAGNOSIS — K5730 Diverticulosis of large intestine without perforation or abscess without bleeding: Secondary | ICD-10-CM | POA: Diagnosis not present

## 2024-10-19 DIAGNOSIS — C50312 Malignant neoplasm of lower-inner quadrant of left female breast: Secondary | ICD-10-CM | POA: Diagnosis present

## 2024-10-19 DIAGNOSIS — Z923 Personal history of irradiation: Secondary | ICD-10-CM | POA: Insufficient documentation

## 2024-10-19 DIAGNOSIS — R1032 Left lower quadrant pain: Secondary | ICD-10-CM | POA: Diagnosis not present

## 2024-10-19 DIAGNOSIS — Z171 Estrogen receptor negative status [ER-]: Secondary | ICD-10-CM | POA: Diagnosis not present

## 2024-10-19 NOTE — Progress Notes (Signed)
 Radiation Oncology Follow up Note  Name: Rebecca Macias   Date:   10/19/2024 MRN:  [id number] DOB: 1961-08-22    This 63 y.o. 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  REFERRING PROVIDER: Gretta Comer POUR, NP  HPI: Patient is a 63 year old female now out 7 months having completed whole breast radiation to her left breast for stage Ia ER/PR negative invasive mammary carcinoma seen today in routine follow-up she is doing well.  She specifically denies breast tenderness cough or bone pain.  She is not on endocrine therapy based on the ER negative nature of her disease..  She had a recent mammogram back in October which I have reviewed was BI-RADS 2 benign.  She was also having some lower left quadrant abdominal pain for which she underwent abdominal CT scan showing no acute intra-abdominal or pelvic abnormality.  She did have some descending sigmoid colonic diverticulosis.  COMPLICATIONS OF TREATMENT: none  FOLLOW UP COMPLIANCE: keeps appointments   PHYSICAL EXAM:  BP 122/76   Pulse (!) 101   Temp 98.6 F (37 C)   Resp 16   Wt 212 lb (96.2 kg)   LMP 11/03/2012   BMI 35.28 kg/m  Lungs are clear to A&P cardiac examination essentially unremarkable with regular rate and rhythm. No dominant mass or nodularity is noted in either breast in 2 positions examined. Incision is well-healed. No axillary or supraclavicular adenopathy is appreciated. Cosmetic result is excellent.  Well-developed well-nourished patient in NAD. HEENT reveals PERLA, EOMI, discs not visualized.  Oral cavity is clear. No oral mucosal lesions are identified. Neck is clear without evidence of cervical or supraclavicular adenopathy. Lungs are clear to A&P. Cardiac examination is essentially unremarkable with regular rate and rhythm without murmur rub or thrill. Abdomen is benign with no  organomegaly or masses noted. Motor sensory and DTR levels are equal and symmetric in the upper and lower extremities. Cranial nerves II through XII are grossly intact. Proprioception is intact. No peripheral adenopathy or edema is identified. No motor or sensory levels are noted. Crude visual fields are within normal range.  RADIOLOGY RESULTS: N CT scans of abdomen pelvis as well as mammograms reviewed compatible with above-stated findings  PLAN: Present time patient is doing well 7 months out from whole breast radiation and pleased with her overall progress.  Of asked to see her back in another 6 months for follow-up.  Patient knows to call with any concerns.  I would like to take this opportunity to thank you for allowing me to participate in the care of your patient.SABRA Marcey Penton, MD

## 2024-10-20 ENCOUNTER — Other Ambulatory Visit: Payer: Self-pay

## 2024-10-27 ENCOUNTER — Ambulatory Visit: Admitting: General Surgery

## 2024-10-27 DIAGNOSIS — G629 Polyneuropathy, unspecified: Secondary | ICD-10-CM

## 2024-10-28 NOTE — Telephone Encounter (Signed)
 Doesn't look like Rebecca Macias prescribes the gabapentin . Can we call the pharmacy and see who prescribed it last?

## 2024-10-31 ENCOUNTER — Other Ambulatory Visit: Payer: Self-pay | Admitting: Primary Care

## 2024-10-31 DIAGNOSIS — G629 Polyneuropathy, unspecified: Secondary | ICD-10-CM

## 2024-11-02 MED ORDER — GABAPENTIN 300 MG PO CAPS: 300.0000 mg | ORAL_CAPSULE | Freq: Every day | ORAL | 2 refills | Status: DC

## 2024-11-03 ENCOUNTER — Other Ambulatory Visit: Payer: Self-pay | Admitting: Primary Care

## 2024-11-03 DIAGNOSIS — R002 Palpitations: Secondary | ICD-10-CM

## 2024-11-03 DIAGNOSIS — E785 Hyperlipidemia, unspecified: Secondary | ICD-10-CM

## 2024-11-08 ENCOUNTER — Ambulatory Visit: Payer: Self-pay

## 2024-11-08 NOTE — Telephone Encounter (Signed)
 Noted. Agree with nursing triage decision. Appreciate Dr. Charlyn evaluation.

## 2024-11-08 NOTE — Telephone Encounter (Signed)
 FYI Only or Action Required?: FYI only for provider: appointment scheduled on 11/09/24.  Patient was last seen in primary care on 09/28/2024 by Gretta Comer POUR, NP.  Called Nurse Triage reporting Cough and Nasal Congestion.  Symptoms began several days ago.  Interventions attempted: OTC medications: Mucinex, robitussin and Prescription medications: Tessalon perles, expired albuterol inhaler prescribed for URI in the past.  Symptoms are: gradually worsening.  Triage Disposition: See Physician Within 24 Hours  Patient/caregiver understands and will follow disposition?: Yes  Copied from CRM #[id number]. Topic: Clinical - Red Word Triage >> Nov 08, 2024 10:29 AM Rebecca Macias wrote: [facility] that prompted transfer to Nurse Triage: Patient has been exposed to RSV, and has an constant cough ,ribs are extremely painful, head extremely stopped up, unable to sleep. Reason for Disposition  SEVERE coughing spells (e.g., whooping sound after coughing, vomiting after coughing)  Answer Assessment - Initial Assessment Questions Pt reports onset of non-productive cough with congestion, runny nose and sore throat on Friday. Denies chest pain, not coughing up blood. Reports intermittent sob with coughing, talking and exertion. Denies lung or cardaic hx. Pt noted to speaking in clear continuous full sentences over the phone, does not sound to be in acute respiratory distress. Has not checked temperature but has felt intermittently feverish. Had negative flu test. Pt worried she may have RSV as her grand kids have recently tested positive. Pt tried to go to UC yesterday to get RSV test but they stated this would need to be done by PCP office. Scheduled appt tomorrow with different provider at home office d/t no PCP availability within timeframe. Advised UC or ED for worsening symptoms.    1. ONSET: When did the cough begin?      Friday  2. SEVERITY: How bad is the cough today?      Severe cough with  movement , talking and laying down. Mild to none when sitting.  3. SPUTUM: Describe the color of your sputum (e.g., none, dry cough; clear, white, yellow, green)     Dry cough  4. HEMOPTYSIS: Are you coughing up any blood? If Yes, ask: How much? (e.g., flecks, streaks, tablespoons, etc.)     Denies  5. DIFFICULTY BREATHING: Are you having difficulty breathing? If Yes, ask: How bad is it? (e.g., mild, moderate, severe)      Moderate SOB intermittently with exertion, coughing and talking  6. FEVER: Do you have a fever? If Yes, ask: What is your temperature, how was it measured, and when did it start?     Unable to check temperature, has felt feverish  7. CARDIAC HISTORY: Do you have any history of heart disease? (e.g., heart attack, congestive heart failure)      Denies  8. LUNG HISTORY: Do you have any history of lung disease?  (e.g., pulmonary embolus, asthma, emphysema)     Denies  9. PE RISK FACTORS: Do you have a history of blood clots? (or: recent major surgery, recent prolonged travel, bedridden)     Denies  10. OTHER SYMPTOMS: Do you have any other symptoms? (e.g., runny nose, wheezing, chest pain)       Runny nose, sore throat, congestion. Denies CP. Reports bilateral sore ribcage pain with coughing. Had negative flu test recently. Reports grand kids have recently tested positive for RSV and worried she may have contracted it. Hx of URI infection and was prescribed albuterol inhaler in the past. Tried the inhaler and hasn't really help.  12. TRAVEL: Have you traveled  out of the country in the last month? (e.g., travel history, exposures)       Denies  Protocols used: Cough - Acute Non-Productive-A-AH

## 2024-11-09 ENCOUNTER — Ambulatory Visit: Admission: RE | Admit: 2024-11-09 | Discharge: 2024-11-09 | Disposition: A | Source: Ambulatory Visit

## 2024-11-09 ENCOUNTER — Ambulatory Visit

## 2024-11-09 VITALS — BP 122/64 | HR 89 | Temp 97.8°F | Ht 65.0 in | Wt 207.0 lb

## 2024-11-09 DIAGNOSIS — R051 Acute cough: Secondary | ICD-10-CM

## 2024-11-09 DIAGNOSIS — R062 Wheezing: Secondary | ICD-10-CM

## 2024-11-09 MED ORDER — BENZONATATE 200 MG PO CAPS: 200.0000 mg | ORAL_CAPSULE | Freq: Three times a day (TID) | ORAL | 0 refills | Status: AC | PRN

## 2024-11-09 MED ORDER — OXYMETAZOLINE HCL 0.05 % NA SOLN: 1.0000 | Freq: Two times a day (BID) | NASAL | 0 refills | Status: AC

## 2024-11-09 MED ORDER — PROMETHAZINE-DM 6.25-15 MG/5ML PO SYRP: 5.0000 mL | ORAL_SOLUTION | Freq: Every evening | ORAL | 0 refills | Status: AC

## 2024-11-09 NOTE — Progress Notes (Unsigned)
 Subjective:   This visit was conducted in person. The patient gave informed consent to the use of Abridge AI technology to record the contents of the encounter as documented below.   Patient ID: Rebecca Macias, female    DOB: [birth year], 62 y.o.   MRN: [id number]   Discussed the use of AI scribe software for clinical note transcription with the patient, who gave verbal consent to proceed.  History of Present Illness Rebecca Macias is a 63 year old female who presents with a persistent cough and congestion.  Her cough began on November 29th, following the diagnosis of RSV in her two granddaughters. She has tested negative for COVID-19 at home. Initially, the cough was more productive and has now become 'hacky'. Rib pain from coughing is present, and she uses a back band for compression to alleviate the pain.  She describes head congestion with thick nasal discharge, which has progressed from a runny nose. She also reports a headache, which she attributes to coughing, and a sensation of fever on Sunday and Monday nights, although she was unable to confirm this due to a non-functional thermometer. She experienced chills and alternating hot and cold sensations during these nights.  She has been using Mucinex, Robitussin, and expired Benzonate for symptom relief, noting that Benzonate helped initially. She also uses cough drops and Tylenol . An inhaler from a previous upper respiratory infection was tried without relief. She feels winded when walking, particularly at work.  No sore throat except from coughing, and no body aches other than rib pain. She has not used Advil , preferring Tylenol  and naproxen  for pain management.  Her past medical history includes recent chemotherapy, which previously caused epistaxis, but she currently reports thick green nasal discharge without epistaxis.   Review of Systems      Allergies  Allergen Reactions  . Paclitaxel  Shortness Of Breath, Other  (See Comments) and Hypertension    Redness to face/chest Back Pain    . Flagyl [Metronidazole] Itching    Current Outpatient Medications on File Prior to Visit  Medication Sig Dispense Refill  . acetaminophen  (TYLENOL ) 500 MG tablet Take 500 mg by mouth every 6 (six) hours as needed.    . atenolol  (TENORMIN ) 25 MG tablet TAKE 1 TABLET (25 MG TOTAL) BY MOUTH DAILY. FOR PALPITATIONS. 90 tablet 2  . Biotin 1000 MCG tablet Take 1,000 mcg by mouth daily.    . blood glucose meter kit and supplies KIT Dispense based on patient and insurance preference. Use up to four times daily as directed. (FOR ICD-9 250.00, 250.01). 1 each 0  . cyanocobalamin 500 MCG tablet Take 1,000 mcg by mouth daily. Reported on 06/12/2016    . docusate sodium (COLACE) 100 MG capsule Take 200 mg by mouth daily as needed for moderate constipation. Reported on 06/12/2016    . gabapentin  (NEURONTIN ) 300 MG capsule Take 1 capsule (300 mg total) by mouth at bedtime. For pain 90 capsule 2  . ibuprofen  (ADVIL ) 800 MG tablet Take 1 tablet (800 mg total) by mouth every 8 (eight) hours as needed. 30 tablet 0  . lidocaine -prilocaine  (EMLA ) cream APPLY TO AFFECTED AREA ONCE AS DIRECTED 30 g 3  . metFORMIN  (GLUCOPHAGE -XR) 500 MG 24 hr tablet TAKE 1 TABLET (500 MG TOTAL) BY MOUTH DAILY WITH BREAKFAST. FOR DIABETES. 90 tablet 1  . pyridOXINE (VITAMIN B6) 25 MG tablet Take 25 mg by mouth daily.    . rosuvastatin  (CRESTOR ) 5 MG tablet TAKE 1 TABLET (5  MG TOTAL) BY MOUTH EVERY EVENING. FOR CHOLESTEROL. 90 tablet 2  . Semaglutide ,0.25 or 0.5MG /DOS, (OZEMPIC , 0.25 OR 0.5 MG/DOSE,) 2 MG/3ML SOPN Inject 0.5 mg into the skin once a week. for diabetes. 9 mL 0  . silver  sulfADIAZINE  (SILVADENE ) 1 % cream Apply 1 Application topically 2 (two) times daily. 50 g 0   Current Facility-Administered Medications on File Prior to Visit  Medication Dose Route Frequency Provider Last Rate Last Admin  . 0.9 %  sodium chloride  infusion   Intravenous Continuous Melanee Annah BROCKS, MD   Stopped at 11/27/23 1158    BP 122/64 (BP Location: Left Arm, Patient Position: Sitting, Cuff Size: Large)   Pulse 89   Temp 97.8 F (36.6 C) (Oral)   Ht 5' 5 (1.651 m)   Wt 207 lb (93.9 kg)   LMP 11/03/2012   SpO2 96%   BMI 34.45 kg/m   Objective:      Physical Exam VITALS: T- 97.8, BP- 97/8 GENERAL: Alert, cooperative, well developed, no acute distress. HEAD: Normocephalic atraumatic. EYES: Extraocular movements intact bilaterally, pupils round, equal and reactive to light bilaterally, conjunctivae normal bilaterally. EARS: Tympanic membrane, ear canal and external ear normal bilaterally. NOSE: Thick nasal discharge present, no frontal or maxillary sinus tenderness bilaterally. THROAT: No oropharyngeal exudate or posterior oropharyngeal erythema. CARDIOVASCULAR: Normal heart rate and rhythm, S1 and S2 normal without murmurs. CHEST: Lungs congested with wheezing and coarse lung sounds. ABDOMEN: Soft, non tender, non distended, without organomegaly, normal bowel sounds. EXTREMITIES: No cyanosis or edema. NEUROLOGICAL: Oriented to person, place and time, no gait abnormalities, moves all extremities without gross motor or sensory deficit.         Assessment & Plan:   Assessment and Plan Assessment & Plan Acute viral upper respiratory infection with sinusitis Cough likely viral, possibly RSV. Negative COVID test. No high fever, suggesting viral etiology. No antibiotics needed. Symptoms expected to improve as virus clears. - Prescribed Afrin nasal spray, one spray each nostril twice daily for three days. - Prescribed Tessalon  200 mg every eight hours as needed during the day. - Prescribed nighttime cough syrup, 5 mL nightly, caution for drowsiness. - Advised follow-up if no improvement in seven days.  Wheezing Likely due to upper airway congestion. Albuterol  inhaler expired. Lungs congested with wheezing. Differential includes viral secretions or  pneumonia. - Ordered chest x-ray to evaluate lung congestion and rule out pneumonia. - Advised use of albuterol  inhaler if x-ray shows no red flags.    CXR - recent hercpetin chemo r/o pneumonia   No follow-ups on file.   Karla Vines K Janet Decesare, MD  11/09/24     Contains text generated by Abridge.

## 2024-11-09 NOTE — Patient Instructions (Signed)
 Thank you for visiting [facility] today! Here's what we talked about: - START Afrin for 3 days, cough syrup at night and tessalon during the day - Return to clinic if symptoms unimproved after 7 days.

## 2024-11-10 ENCOUNTER — Ambulatory Visit: Payer: Self-pay

## 2024-11-10 ENCOUNTER — Telehealth: Payer: Self-pay

## 2024-11-10 MED ORDER — ALBUTEROL SULFATE HFA 108 (90 BASE) MCG/ACT IN AERS: 2.0000 | INHALATION_SPRAY | Freq: Four times a day (QID) | RESPIRATORY_TRACT | 0 refills | Status: AC | PRN

## 2024-11-10 MED ORDER — DOXYCYCLINE HYCLATE 100 MG PO TABS: 100.0000 mg | ORAL_TABLET | Freq: Two times a day (BID) | ORAL | 0 refills | Status: AC

## 2024-11-10 MED ORDER — AMOXICILLIN-POT CLAVULANATE 875-125 MG PO TABS: 1.0000 | ORAL_TABLET | Freq: Two times a day (BID) | ORAL | 0 refills | Status: AC

## 2024-11-10 NOTE — Telephone Encounter (Signed)
 Of course!

## 2024-11-10 NOTE — Telephone Encounter (Signed)
 Please advise don't see either results in. I have called patient to follow up with her symptoms. She was able to sit in massage chair last night and that helped a little. She was afraid to take cough meds that were given because it listed not to take If you have pneumonia and she was afraid to take. Patient states that cough is still non productive. She has inhaler that was given in the past for albuterol and she took it every hour all night. She could not tell any improvement when she took it. Denies any symptoms from taking so often. She has started with wheezing that was worse last night. She is feeling a little better. She was able to have conversation without stopping or any obvious sob. I have reviewed all red words with patient if any before we call back she will go to ED.   She would like to know if ok to take cough medicine. I have recommended that she hold albuterol until we are able to get instructions from provider.   I have called radiology and asked to change x ray to urgent. They are marking

## 2024-11-10 NOTE — Telephone Encounter (Signed)
 See other messages. This was taken care of by Dr Bennett

## 2024-11-10 NOTE — Telephone Encounter (Signed)
 Have noticed that Dr. Bennett is out of the office today. Sending to pcp to review.

## 2024-11-10 NOTE — Telephone Encounter (Signed)
 Reviewed xray and sent pt an update on MyChart, will call her

## 2024-11-10 NOTE — Telephone Encounter (Signed)
 Thank you for seeing her

## 2024-11-10 NOTE — Telephone Encounter (Signed)
 Called pt, explained that cough symptoms remain unimproved because she did not take the cough medication, reassured her that it is okay to use for symptomatic  relief. Discussed reticular pattern on CXR and how this is more consistent with viral pneumonia, however, given current Herceptin  infusions which increase risk of infection, bacterial super-infection is a possibility, we will cover with antibiotics, risks/side effects discussed with patient, sent to pharmacy. Counseled to use all the conservative measures prescribed yesterday as needed for symptomatic relief. Albuterol inhaler sent for wheezing since hers is expired. ED precautions given.

## 2024-11-11 ENCOUNTER — Encounter: Payer: Self-pay | Admitting: Oncology

## 2024-11-11 ENCOUNTER — Other Ambulatory Visit: Payer: Self-pay

## 2024-11-11 ENCOUNTER — Inpatient Hospital Stay: Attending: Oncology | Admitting: Oncology

## 2024-11-11 ENCOUNTER — Inpatient Hospital Stay

## 2024-11-11 ENCOUNTER — Inpatient Hospital Stay: Attending: Oncology

## 2024-11-11 VITALS — BP 136/63 | HR 90 | Temp 98.0°F | Resp 18 | Wt 209.5 lb

## 2024-11-11 VITALS — BP 139/87 | HR 88

## 2024-11-11 DIAGNOSIS — Z1722 Progesterone receptor negative status: Secondary | ICD-10-CM

## 2024-11-11 DIAGNOSIS — C50312 Malignant neoplasm of lower-inner quadrant of left female breast: Secondary | ICD-10-CM | POA: Insufficient documentation

## 2024-11-11 DIAGNOSIS — Z1731 Human epidermal growth factor receptor 2 positive status: Secondary | ICD-10-CM | POA: Diagnosis not present

## 2024-11-11 DIAGNOSIS — Z5112 Encounter for antineoplastic immunotherapy: Secondary | ICD-10-CM | POA: Insufficient documentation

## 2024-11-11 DIAGNOSIS — Z171 Estrogen receptor negative status [ER-]: Secondary | ICD-10-CM | POA: Diagnosis not present

## 2024-11-11 DIAGNOSIS — Z87891 Personal history of nicotine dependence: Secondary | ICD-10-CM | POA: Diagnosis not present

## 2024-11-11 DIAGNOSIS — Z79899 Other long term (current) drug therapy: Secondary | ICD-10-CM | POA: Insufficient documentation

## 2024-11-11 LAB — CBC WITH DIFFERENTIAL/PLATELET
Abs Immature Granulocytes: 0.04 K/uL (ref 0.00–0.07)
Basophils Absolute: 0 K/uL (ref 0.0–0.1)
Basophils Relative: 1 %
Eosinophils Absolute: 0.3 K/uL (ref 0.0–0.5)
Eosinophils Relative: 4 %
HCT: 35.9 % — ABNORMAL LOW (ref 36.0–46.0)
Hemoglobin: 12.2 g/dL (ref 12.0–15.0)
Immature Granulocytes: 1 %
Lymphocytes Relative: 35 %
Lymphs Abs: 2.5 K/uL (ref 0.7–4.0)
MCH: 30.8 pg (ref 26.0–34.0)
MCHC: 34 g/dL (ref 30.0–36.0)
MCV: 90.7 fL (ref 80.0–100.0)
Monocytes Absolute: 0.4 K/uL (ref 0.1–1.0)
Monocytes Relative: 6 %
Neutro Abs: 3.8 K/uL (ref 1.7–7.7)
Neutrophils Relative %: 53 %
Platelets: 283 K/uL (ref 150–400)
RBC: 3.96 MIL/uL (ref 3.87–5.11)
RDW: 12.9 % (ref 11.5–15.5)
Smear Review: NORMAL
WBC: 7 K/uL (ref 4.0–10.5)
nRBC: 0 % (ref 0.0–0.2)

## 2024-11-11 LAB — CMP (CANCER CENTER ONLY)
ALT: 19 U/L (ref 0–44)
AST: 31 U/L (ref 15–41)
Albumin: 3.7 g/dL (ref 3.5–5.0)
Alkaline Phosphatase: 103 U/L (ref 38–126)
Anion gap: 11 (ref 5–15)
BUN: 8 mg/dL (ref 8–23)
CO2: 24 mmol/L (ref 22–32)
Calcium: 9.2 mg/dL (ref 8.9–10.3)
Chloride: 105 mmol/L (ref 98–111)
Creatinine: 0.65 mg/dL (ref 0.44–1.00)
GFR, Estimated: >60 mL/min (ref >60)
Glucose, Bld: 189 mg/dL — ABNORMAL HIGH (ref 70–99)
Potassium: 3.7 mmol/L (ref 3.5–5.1)
Sodium: 141 mmol/L (ref 135–145)
Total Bilirubin: 0.6 mg/dL (ref 0.0–1.2)
Total Protein: 7.2 g/dL (ref 6.5–8.1)

## 2024-11-11 MED ORDER — SODIUM CHLORIDE 0.9 % IV SOLN
INTRAVENOUS | Status: DC
  Filled 2024-11-11: qty 250

## 2024-11-11 MED ORDER — ACETAMINOPHEN 325 MG PO TABS
650.0000 mg | ORAL_TABLET | Freq: Once | ORAL | Status: AC
  Administered 2024-11-11: 650 mg via ORAL
  Filled 2024-11-11: qty 2

## 2024-11-11 MED ORDER — TRASTUZUMAB-ANNS CHEMO 150 MG IV SOLR
6.0000 mg/kg | Freq: Once | INTRAVENOUS | Status: AC
  Administered 2024-11-11: 600 mg via INTRAVENOUS
  Filled 2024-11-11: qty 28.57

## 2024-11-11 MED ORDER — DIPHENHYDRAMINE HCL 25 MG PO TABS
25.0000 mg | ORAL_TABLET | Freq: Once | ORAL | Status: AC
  Administered 2024-11-11: 25 mg via ORAL
  Filled 2024-11-11: qty 1

## 2024-11-11 NOTE — Progress Notes (Signed)
 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  Ultrasound of the left axilla demonstrated normal lymph nodes.  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  8 mm.  Tumor was ER/PR -0% and HER2 positive +3 by IHC.    Menarche at the age of 23.  She is G5, P5.  Age at first birth 84.  She has used birth control pills in the past.  She attained menopause in her 50s.  Family history significant for breast cancer in 2 sisters of her maternal grandmother.  Mom with cervical cancer in her 26s.  Paternal uncle with stomach cancer.  Her sister has been diagnosed with some form of skin cancer.   Left lumpectomy pathology from 11/04/2023 showed 10 mm grade 3 tumor with negative margins.  3 sentinel lymph nodes negative for malignancy.  Tumor was ER/PR negative and HER2 positive.  Patient had significant infusion reaction with first cycle of Taxol .  She  subsequently went on to do 9 cycles of weekly Abraxane  and Herceptin .  She had worsening neuropathy skin rash and edema with Abraxane  and was subsequently stopped.  She is currently undergoing adjuvant radiation therapy and receiving maintenance Herceptin     Interval history-patient has been having symptoms of cough chest congestion and fatigue over the last 1 week.  She was seen by her PCP yesterday and underwent chest x-ray which showed bronchitis.  She is presently on Augmentin  plus doxycycline  and over-the-counter cough supplements  ECOG PS- 1 Pain scale- 0   Review of systems- Review of Systems  Constitutional:  Positive for malaise/fatigue. Negative for chills, fever and weight loss.  HENT:  Positive for congestion. Negative for ear discharge and nosebleeds.   Eyes:  Negative for blurred vision.  Respiratory:  Positive for cough. Negative for hemoptysis, sputum production, shortness of breath and wheezing.   Cardiovascular:  Negative for chest pain, palpitations, orthopnea and claudication.  Gastrointestinal:  Negative for abdominal pain, blood in stool, constipation, diarrhea, heartburn, melena, nausea and vomiting.  Genitourinary:  Negative for dysuria, flank pain, frequency, hematuria and urgency.  Musculoskeletal:  Negative for back pain, joint pain and myalgias.  Skin:  Negative for rash.  Neurological:  Negative for dizziness, tingling, focal weakness, seizures, weakness and headaches.  Endo/Heme/Allergies:  Does not bruise/bleed easily.  Psychiatric/Behavioral:  Negative for depression and suicidal ideas. The patient does not have insomnia.       Allergies  Allergen Reactions   Paclitaxel  Shortness Of Breath, Other (See Comments) and Hypertension  Redness to face/chest Back Pain     Flagyl [Metronidazole] Itching     Past Medical History:  Diagnosis Date   Adenoma of appendix    Anemia    Chronic lower back pain    GERD (gastroesophageal reflux disease)    H/O  gestational diabetes mellitus, not currently pregnant    History of kidney stones    Hypercholesteremia    Hyperplastic rectal polyp    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, estrogen receptor negative (HCC)    Obesity    Ovarian cyst    Palpitations    Personal history of chemotherapy    Personal history of radiation therapy    Sigmoid diverticulitis 08/2012   Subclinical hypothyroidism    SVT (supraventricular tachycardia)    on Holter monitor 5 beat run of svt   Thyroid  nodule    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   Type 2 diabetes mellitus (HCC)      Past Surgical History:  Procedure Laterality Date   AXILLARY SENTINEL NODE BIOPSY Left 11/04/2023   Procedure: AXILLARY SENTINEL NODE BIOPSY;  Surgeon: Lane Shope, MD;  Location: [facility] ORS;  Service: General;  Laterality: Left;   BLADDER SUSPENSION  2009   BREAST BIOPSY Left 10/22/2023   Us  Core Bx, heart clip - path pending   BREAST BIOPSY Left 10/22/2023   US  LT BREAST BX W LOC DEV 1ST LESION IMG BX SPEC US  GUIDE 10/22/2023 [facility]   BREAST LUMPECTOMY WITH RADIOFREQUENCY TAG IDENTIFICATION Left 11/04/2023   Procedure: BREAST LUMPECTOMY WITH RADIOFREQUENCY TAG IDENTIFICATION;  Surgeon: Lane Shope, MD;  Location: [facility] ORS;  Service: General;  Laterality: Left;   Colectomy Left    likely sigmoid, open.  for diverticulitis.   COLONOSCOPY WITH PROPOFOL  N/A 10/01/2022   Procedure: COLONOSCOPY WITH PROPOFOL ;  Surgeon: Unk Corinn Skiff, MD;  Location: [facility] ENDOSCOPY;  Service: Gastroenterology;  Laterality: N/A;   PORTACATH PLACEMENT Right 11/04/2023   Procedure: INSERTION PORT-A-CATH;  Surgeon: Lane Shope, MD;  Location: [facility] ORS;  Service: General;  Laterality: Right;   TUBAL LIGATION     XI ROBOTIC LAPAROSCOPIC ASSISTED APPENDECTOMY N/A 12/29/2022   Procedure: XI ROBOTIC LYSIS OF ADHESIONS AND APPENDECTOMY;  Surgeon: Lane Shope, MD;  Location: [facility] ORS;  Service: General;  Laterality:  N/A;    Social History   Socioeconomic History   Marital status: Single    Spouse name: Not on file   Number of children: Not on file   Years of education: Not on file   Highest education level: Some college, no degree  Occupational History   Not on file  Tobacco Use   Smoking status: Former    Current packs/day: 0.00    Types: Cigarettes, Cigars    Quit date: 09/19/2011    Years since quitting: 13.1   Smokeless tobacco: Never  Vaping Use   Vaping status: Former  Substance and Sexual Activity   Alcohol use: Not Currently    Comment: Occasional glass of wine   Drug use: No   Sexual activity: Not Currently  Other Topics Concern   Not on file  Social History Narrative   Works in audiological scientist at [employer].   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   Social Drivers of Health   Financial Resource Strain: Low Risk  (06/17/2023)   Overall Financial Resource Strain (CARDIA)    Difficulty of Paying Living Expenses: Not very hard  Food Insecurity: No Food Insecurity (10/28/2023)   Hunger Vital Sign  Worried About Programme Researcher, Broadcasting/film/video in the Last Year: Never true    Ran Out of Food in the Last Year: Never true  Transportation Needs: No Transportation Needs (10/28/2023)   PRAPARE - Administrator, Civil Service (Medical): No    Lack of Transportation (Non-Medical): No  Physical Activity: Sufficiently Active (06/17/2023)   Exercise Vital Sign    Days of Exercise per Week: 7 days    Minutes of Exercise per Session: 60 min  Stress: Stress Concern Present (06/17/2023)   [employer] of Occupational Health - Occupational Stress Questionnaire    Feeling of Stress : To some extent  Social Connections: Moderately Integrated (06/17/2023)   Social Connection and Isolation Panel    Frequency of Communication with Friends and Family: More than three times a week    Frequency of Social Gatherings with Friends and Family: Once a week    Attends Religious Services:  More than 4 times per year    Active Member of [employer] or Organizations: Yes    Attends Banker Meetings: More than 4 times per year    Marital Status: Divorced  Intimate Partner Violence: Not At Risk (10/28/2023)   Humiliation, Afraid, Rape, and Kick questionnaire    Fear of Current or Ex-Partner: No    Emotionally Abused: No    Physically Abused: No    Sexually Abused: No    Family History  Problem Relation Age of Onset   Cancer - Cervical Mother        dx 9s; Passed away from Kidney Issues (Not Cancer)   Heart disease Father    Kidney disease Father    Diabetes Father    Skin cancer Sister    Stomach cancer Paternal Uncle    Diabetes Maternal Grandmother    Skin cancer Maternal Grandfather    Diabetes Paternal Grandmother    Breast cancer Other        mat great aunts x2; unk age of dx   Lung cancer Cousin    Brain cancer Cousin      Current Outpatient Medications:    acetaminophen  (TYLENOL ) 500 MG tablet, Take 500 mg by mouth every 6 (six) hours as needed., Disp: , Rfl:    albuterol  (VENTOLIN  HFA) 108 (90 Base) MCG/ACT inhaler, Inhale 2 puffs into the lungs every 6 (six) hours as needed for wheezing or shortness of breath., Disp: 8 g, Rfl: 0   amoxicillin -clavulanate (AUGMENTIN ) 875-125 MG tablet, Take 1 tablet by mouth 2 (two) times daily for 5 days., Disp: 10 tablet, Rfl: 0   atenolol  (TENORMIN ) 25 MG tablet, TAKE 1 TABLET (25 MG TOTAL) BY MOUTH DAILY. FOR PALPITATIONS., Disp: 90 tablet, Rfl: 2   benzonatate  (TESSALON ) 200 MG capsule, Take 1 capsule (200 mg total) by mouth 3 (three) times daily as needed for up to 14 days for cough., Disp: 42 capsule, Rfl: 0   Biotin 1000 MCG tablet, Take 1,000 mcg by mouth daily., Disp: , Rfl:    blood glucose meter kit and supplies KIT, Dispense based on patient and insurance preference. Use up to four times daily as directed. (FOR ICD-9 250.00, 250.01)., Disp: 1 each, Rfl: 0   cyanocobalamin 500 MCG tablet, Take 1,000 mcg by  mouth daily. Reported on 06/12/2016, Disp: , Rfl:    docusate sodium (COLACE) 100 MG capsule, Take 200 mg by mouth daily as needed for moderate constipation. Reported on 06/12/2016, Disp: , Rfl:    doxycycline  (VIBRA -TABS) 100 MG tablet, Take  1 tablet (100 mg total) by mouth 2 (two) times daily for 5 days., Disp: 10 tablet, Rfl: 0   ibuprofen  (ADVIL ) 800 MG tablet, Take 1 tablet (800 mg total) by mouth every 8 (eight) hours as needed., Disp: 30 tablet, Rfl: 0   lidocaine -prilocaine  (EMLA ) cream, APPLY TO AFFECTED AREA ONCE AS DIRECTED, Disp: 30 g, Rfl: 3   metFORMIN  (GLUCOPHAGE -XR) 500 MG 24 hr tablet, TAKE 1 TABLET (500 MG TOTAL) BY MOUTH DAILY WITH BREAKFAST. FOR DIABETES., Disp: 90 tablet, Rfl: 1   oxymetazoline  (AFRIN 12 HOUR) 0.05 % nasal spray, Place 1 spray into both nostrils 2 (two) times daily for 3 days. USE ONLY FOR 3 DAYS, Disp: 0.6 mL, Rfl: 0   promethazine -dextromethorphan (PROMETHAZINE -DM) 6.25-15 MG/5ML syrup, Take 5 mLs by mouth at bedtime., Disp: 118 mL, Rfl: 0   pyridOXINE (VITAMIN B6) 25 MG tablet, Take 25 mg by mouth daily., Disp: , Rfl:    rosuvastatin  (CRESTOR ) 5 MG tablet, TAKE 1 TABLET (5 MG TOTAL) BY MOUTH EVERY EVENING. FOR CHOLESTEROL., Disp: 90 tablet, Rfl: 2   Semaglutide ,0.25 or 0.5MG /DOS, (OZEMPIC , 0.25 OR 0.5 MG/DOSE,) 2 MG/3ML SOPN, Inject 0.5 mg into the skin once a week. for diabetes., Disp: 9 mL, Rfl: 0   silver  sulfADIAZINE  (SILVADENE ) 1 % cream, Apply 1 Application topically 2 (two) times daily., Disp: 50 g, Rfl: 0   gabapentin  (NEURONTIN ) 300 MG capsule, Take 1 capsule (300 mg total) by mouth at bedtime. For pain (Patient not taking: Reported on 11/11/2024), Disp: 90 capsule, Rfl: 2 No current facility-administered medications for this visit.  Facility-Administered Medications Ordered in Other Visits:    0.9 %  sodium chloride  infusion, , Intravenous, Continuous, Melanee Annah BROCKS, MD, Stopped at 11/27/23 1158   0.9 %  sodium chloride  infusion, , Intravenous,  Continuous, Melanee Annah BROCKS, MD, Stopped at 11/11/24 1135  Physical exam:  Vitals:   11/11/24 0928  BP: 136/63  Pulse: 90  Resp: 18  Temp: 98 F (36.7 C)  SpO2: 97%  Weight: 209 lb 8 oz (95 kg)   Physical Exam Cardiovascular:     Rate and Rhythm: Normal rate and regular rhythm.     Heart sounds: Normal heart sounds.  Pulmonary:     Effort: Pulmonary effort is normal.     Breath sounds: Wheezing present.  Abdominal:     General: Bowel sounds are normal.     Palpations: Abdomen is soft.  Skin:    General: Skin is warm and dry.  Neurological:     Mental Status: She is alert and oriented to person, place, and time.      I have personally reviewed labs listed below:    Latest Ref Rng & Units 11/11/2024    8:58 AM  CMP  Glucose 70 - 99 mg/dL 810   BUN 8 - 23 mg/dL 8   Creatinine 9.55 - 8.99 mg/dL 9.34   Sodium 864 - 854 mmol/L 141   Potassium 3.5 - 5.1 mmol/L 3.7   Chloride 98 - 111 mmol/L 105   CO2 22 - 32 mmol/L 24   Calcium  8.9 - 10.3 mg/dL 9.2   Total Protein 6.5 - 8.1 g/dL 7.2   Total Bilirubin 0.0 - 1.2 mg/dL 0.6   Alkaline Phos 38 - 126 U/L 103   AST 15 - 41 U/L 31   ALT 0 - 44 U/L 19       Latest Ref Rng & Units 11/11/2024    8:58 AM  CBC  WBC  4.0 - 10.5 K/uL 7.0   Hemoglobin 12.0 - 15.0 g/dL 87.7   Hematocrit 63.9 - 46.0 % 35.9   Platelets 150 - 400 K/uL 283    I have personally reviewed Radiology images listed below: No images are attached to the encounter.  DG Chest 2 View Result Date: 11/10/2024 CLINICAL DATA:  Cough. EXAM: CHEST - 2 VIEW COMPARISON:  Chest x-ray 01/19/2024 FINDINGS: The right-sided power port is stable. The cardiac silhouette, mediastinal and hilar contours are within normal limits and unchanged. Peribronchial thickening and increased interstitial markings suggesting bronchitis or interstitial pneumonitis. No pulmonary infiltrates, pleural effusions or pneumothorax. No worrisome pulmonary lesions. The bony thorax is intact.  IMPRESSION: Peribronchial thickening and increased interstitial markings suggesting bronchitis or interstitial pneumonitis. No pulmonary infiltrates. Electronically Signed   By: MYRTIS Stammer M.D.   On: 11/10/2024 10:57     Assessment and plan- Patient is a 63 y.o. 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 She is s/p 9 weekly cycles of Abraxane  and Herceptin  which was subsequently stopped due to intolerance.  She is here for on treatment assessment prior to cycle 15 of maintenance Herceptin   Counts okay to proceed with cycle 15 of maintenance Herceptin  today.  I will see her back in 3 weeks for cycle 16 which will be her last cycle.  Acute bronchitis: She is on over-the-counter cough supplements and antibiotics as prescribed by PCP.  She will let us  know if her condition does not improve despite   Visit Diagnosis 1. 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 in female, estrogen receptor negative (HCC)   2. Encounter for monoclonal antibody treatment for malignancy      Dr. Annah Skene, MD, MPH [facility] at [facility] [phone] 11/11/2024 1:09 PM

## 2024-11-11 NOTE — Progress Notes (Signed)
Patient is currently being treated for pneumonia.

## 2024-11-12 LAB — COVID-19, FLU A+B AND RSV
Influenza A, NAA: NOT DETECTED
Influenza B, NAA: NOT DETECTED
RSV, NAA: DETECTED — AB
SARS-CoV-2, NAA: NOT DETECTED

## 2024-11-12 NOTE — Telephone Encounter (Signed)
 error

## 2024-11-17 ENCOUNTER — Ambulatory Visit: Admitting: General Surgery

## 2024-11-17 ENCOUNTER — Encounter: Payer: Self-pay | Admitting: Primary Care

## 2024-11-17 ENCOUNTER — Encounter: Payer: Self-pay | Admitting: General Surgery

## 2024-11-17 VITALS — BP 123/69 | HR 72 | Ht 65.0 in | Wt 206.0 lb

## 2024-11-17 DIAGNOSIS — Z08 Encounter for follow-up examination after completed treatment for malignant neoplasm: Secondary | ICD-10-CM

## 2024-11-17 DIAGNOSIS — Z171 Estrogen receptor negative status [ER-]: Secondary | ICD-10-CM

## 2024-11-17 DIAGNOSIS — C50312 Malignant neoplasm of lower-inner quadrant of left female breast: Secondary | ICD-10-CM | POA: Diagnosis not present

## 2024-11-17 NOTE — Progress Notes (Signed)
 Outpatient Surgical Follow Up  11/17/2024  Rebecca Macias is an 63 y.o. female.   Chief Complaint  Patient presents with   Routine Post Op    HPI: The patient returns today status post lumpectomy with sentinel lymph node biopsy for left breast cancer.  She reports doing well.  She completed radiation and had some radiation changes to her breast and some pain at the last session.  She did have some trouble with chemotherapy but now is completing Herceptin  and doing well with that.  Denies any new breast masses or lumps.  She denies any new skin changes or dimpling of the skin.  She denies any nipple discharge.  She also reports that she has noticed a bulge in her abdomen.  She says that this happens when she coughs.  She has not had any overlying skin changes to this.  She intermittently will have a small amount of pain.  She did have a CT scan that showed a fat-containing ventral hernia.  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, estrogen receptor negative (HCC)    Obesity    Ovarian cyst    Palpitations    Personal history of chemotherapy    Personal history of radiation therapy    Sigmoid diverticulitis 08/2012   Subclinical hypothyroidism    SVT (supraventricular tachycardia)    on Holter monitor 5 beat run of svt   Thyroid  nodule    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   Type 2 diabetes mellitus (HCC)     Past Surgical History:  Procedure Laterality Date   AXILLARY SENTINEL NODE BIOPSY Left 11/04/2023   Procedure: AXILLARY SENTINEL NODE BIOPSY;  Surgeon: Lane Shope, MD;  Location: [facility] ORS;  Service: General;  Laterality: Left;   BLADDER SUSPENSION  2009   BREAST BIOPSY Left  10/22/2023   Us  Core Bx, heart clip - path pending   BREAST BIOPSY Left 10/22/2023   US  LT BREAST BX W LOC DEV 1ST LESION IMG BX SPEC US  GUIDE 10/22/2023 [facility]   BREAST LUMPECTOMY WITH RADIOFREQUENCY TAG IDENTIFICATION Left 11/04/2023   Procedure: BREAST LUMPECTOMY WITH RADIOFREQUENCY TAG IDENTIFICATION;  Surgeon: Lane Shope, MD;  Location: [facility] ORS;  Service: General;  Laterality: Left;   Colectomy Left    likely sigmoid, open.  for diverticulitis.   COLONOSCOPY WITH PROPOFOL  N/A 10/01/2022   Procedure: COLONOSCOPY WITH PROPOFOL ;  Surgeon: Unk Corinn Skiff, MD;  Location: [facility] ENDOSCOPY;  Service: Gastroenterology;  Laterality: N/A;   PORTACATH PLACEMENT Right 11/04/2023   Procedure: INSERTION PORT-A-CATH;  Surgeon: Lane Shope, MD;  Location: [facility] ORS;  Service: General;  Laterality: Right;   TUBAL LIGATION     XI ROBOTIC LAPAROSCOPIC ASSISTED APPENDECTOMY N/A 12/29/2022   Procedure: XI ROBOTIC LYSIS OF ADHESIONS AND APPENDECTOMY;  Surgeon: Lane Shope, MD;  Location: [facility] ORS;  Service: General;  Laterality: N/A;    Family History  Problem Relation Age of Onset   Cancer - Cervical Mother        dx 55s; Passed away from Kidney Issues (Not Cancer)   Heart disease Father    Kidney disease Father    Diabetes Father    Skin cancer  Sister    Stomach cancer Paternal Uncle    Diabetes Maternal Grandmother    Skin cancer Maternal Grandfather    Diabetes Paternal Grandmother    Breast cancer Other        mat great aunts x2; unk age of dx   Lung cancer Cousin    Brain cancer Cousin     Social History:  reports that she quit smoking about 13 years ago. Her smoking use included cigarettes and cigars. She has never used smokeless tobacco. She reports that she does not currently use alcohol. She reports that she does not use drugs.  Allergies: Allergies[1]  Medications reviewed.    ROS Full ROS performed and is otherwise negative other than what is stated  in HPI   BP 123/69   Pulse 72   Ht 5' 5 (1.651 m)   Wt 206 lb (93.4 kg)   LMP 11/03/2012   SpO2 98%   BMI 34.28 kg/m   Physical Exam Alert and oriented x 3, normal work of breathing on room air, regular rate and rhythm, abdomen is soft, mild tenderness just to the left of the umbilicus where there is a bulge with Valsalva and what feels to be a fat-containing hernia.  I am unable to completely reduce the fat although given her body habitus it is difficult to tell.  Bilateral breast exam performed in the presence of a chaperone.  Right breast at the superior chest there is a well-healed port incision and port appears to be in place.  No dominant masses or lesions, no right axillary lymphadenopathy.  On the left side she does have some radiation changes throughout the left breast.  There is some induration tissue in the inferior inner quadrant, incision has healed well, axillary incision has healed well without any axillary lymphadenopathy.  Mammogram was read as benign, CT scan independently reviewed and has a fat-containing hernia. Assessment/Plan:  Patient status post left mastectomy with sentinel lymph node biopsy and is currently receiving chemotherapy.  Mammogram without evidence of recurrence of disease.  We will get a mammogram and have her return to discuss this in 1 year.  As far as her ventral hernia I discussed with her that I would not like to repair this when she is on active chemotherapy.  We will bring her in again in about 8 weeks when she has done her last cycle of Herceptin  to further discuss the treatment options for her ventral hernia.   A total of 47 minutes was spent reviewing the patient's chart, performing history and physical and discussing treatment options with the patient.  Jayson Endow, M.D. [facility]      [1]  Allergies Allergen Reactions   Paclitaxel  Shortness Of Breath, Other (See Comments) and Hypertension    Redness to  face/chest Back Pain     Flagyl [Metronidazole] Itching

## 2024-11-17 NOTE — Patient Instructions (Addendum)
 The patient has been asked to return to the office in one year with a bilateral diagnostic mammogram. We will send you a letter about these appointments.  We will have you follow up here in 8 weeks to talk about your hernia and possible repair.   Ventral Hernia    A ventral hernia occurs when abdominal contents bulge through a weak spot in the abdominal wall, often requiring surgical repair. What is a Ventral Hernia? A ventral hernia is a type of hernia that occurs through the front abdominal wall. The term ventral refers to the front or belly area. This condition arises when an internal organ or tissue, such as a loop of intestine or fatty tissue, protrudes through a weakness in the abdominal muscles. Ventral hernias can occur in various locations on the abdominal wall and are often categorized into different types based on their location and cause. Types of Ventral Hernias Umbilical Hernia: Occurs around the belly button and is common in infants but can also affect adults, especially those who are overweight or have had multiple pregnancies. Incisional Hernia: Develops at the site of a previous surgical incision, often due to weakness in the abdominal wall after surgery. Epigastric Hernia: Occurs in the upper part of the abdomen, between the belly button and the breastbone. Paraumbilical Hernia: A type of umbilical hernia that occurs next to the belly button. Symptoms The most common symptom of a ventral hernia is a visible bulge in the abdominal wall, which may become more pronounced when standing, coughing, or straining. Other symptoms can include: Discomfort or pain at the site of the bulge, especially when lifting or bending. A feeling of heaviness in the abdomen. In some cases, complications such as strangulation (where the blood supply to the herniated tissue is cut off) can occur, leading to severe pain and requiring immediate medical attention. Causes and Risk Factors Ventral  hernias can develop due to a combination of factors, including: Weakness in the abdominal wall: This can be congenital (present at birth) or acquired due to factors like obesity, pregnancy, or previous surgeries. Increased abdominal pressure: Activities that increase pressure in the abdomen, such as heavy lifting, chronic coughing, or straining during bowel movements, can contribute to the development of a hernia. Treatment Options The definitive treatment for a ventral hernia is usually surgical repair. The decision to proceed with surgery depends on the size of the hernia, the presence of symptoms, and the overall health of the patient. Surgical options may include: Open surgery: Involves making a larger incision to repair the hernia. Laparoscopic surgery: A minimally invasive technique using small incisions and a camera to guide the repair. Mesh repair: Often, a mesh is used to reinforce the abdominal wall and reduce the risk of recurrence. If you suspect you have a ventral hernia or are experiencing symptoms, it is important to consult a healthcare professional for an accurate diagnosis and appropriate management.

## 2024-11-28 ENCOUNTER — Telehealth: Payer: Self-pay | Admitting: Oncology

## 2024-11-28 NOTE — Telephone Encounter (Signed)
 Please let me know when I need to move her appointment

## 2024-11-28 NOTE — Telephone Encounter (Signed)
 Patient called to request appointment reschedule. She stated she has to work on 12/29 and can not get out of it this late. Please call patient to let her know when she can reschedule to.

## 2024-11-29 ENCOUNTER — Other Ambulatory Visit: Payer: Self-pay | Admitting: Oncology

## 2024-11-29 DIAGNOSIS — C50312 Malignant neoplasm of lower-inner quadrant of left female breast: Secondary | ICD-10-CM

## 2024-12-05 ENCOUNTER — Inpatient Hospital Stay: Admitting: Oncology

## 2024-12-05 ENCOUNTER — Inpatient Hospital Stay

## 2024-12-07 ENCOUNTER — Other Ambulatory Visit: Payer: Self-pay

## 2024-12-07 DIAGNOSIS — R062 Wheezing: Secondary | ICD-10-CM

## 2024-12-09 ENCOUNTER — Inpatient Hospital Stay: Attending: Oncology | Admitting: Oncology

## 2024-12-09 ENCOUNTER — Encounter: Payer: Self-pay | Admitting: Oncology

## 2024-12-09 ENCOUNTER — Inpatient Hospital Stay

## 2024-12-09 ENCOUNTER — Other Ambulatory Visit: Payer: Self-pay

## 2024-12-09 VITALS — BP 116/83 | HR 80 | Temp 95.9°F | Resp 19 | Ht 65.0 in | Wt 208.8 lb

## 2024-12-09 VITALS — BP 127/62 | HR 75

## 2024-12-09 DIAGNOSIS — Z808 Family history of malignant neoplasm of other organs or systems: Secondary | ICD-10-CM | POA: Diagnosis not present

## 2024-12-09 DIAGNOSIS — Z5112 Encounter for antineoplastic immunotherapy: Secondary | ICD-10-CM | POA: Diagnosis present

## 2024-12-09 DIAGNOSIS — Z800 Family history of malignant neoplasm of digestive organs: Secondary | ICD-10-CM

## 2024-12-09 DIAGNOSIS — Z801 Family history of malignant neoplasm of trachea, bronchus and lung: Secondary | ICD-10-CM | POA: Diagnosis not present

## 2024-12-09 DIAGNOSIS — C50312 Malignant neoplasm of lower-inner quadrant of left female breast: Secondary | ICD-10-CM | POA: Insufficient documentation

## 2024-12-09 DIAGNOSIS — Z1731 Human epidermal growth factor receptor 2 positive status: Secondary | ICD-10-CM | POA: Diagnosis not present

## 2024-12-09 DIAGNOSIS — Z171 Estrogen receptor negative status [ER-]: Secondary | ICD-10-CM

## 2024-12-09 DIAGNOSIS — Z803 Family history of malignant neoplasm of breast: Secondary | ICD-10-CM | POA: Diagnosis not present

## 2024-12-09 DIAGNOSIS — Z87891 Personal history of nicotine dependence: Secondary | ICD-10-CM

## 2024-12-09 DIAGNOSIS — Z1722 Progesterone receptor negative status: Secondary | ICD-10-CM | POA: Insufficient documentation

## 2024-12-09 MED ORDER — DIPHENHYDRAMINE HCL 25 MG PO TABS
25.0000 mg | ORAL_TABLET | Freq: Once | ORAL | Status: AC
  Administered 2024-12-09: 25 mg via ORAL
  Filled 2024-12-09: qty 1

## 2024-12-09 MED ORDER — SODIUM CHLORIDE 0.9 % IV SOLN
INTRAVENOUS | Status: DC
  Filled 2024-12-09: qty 250

## 2024-12-09 MED ORDER — ACETAMINOPHEN 325 MG PO TABS
650.0000 mg | ORAL_TABLET | Freq: Once | ORAL | Status: AC
  Administered 2024-12-09: 650 mg via ORAL
  Filled 2024-12-09: qty 2

## 2024-12-09 MED ORDER — TRASTUZUMAB-ANNS CHEMO 150 MG IV SOLR
6.0000 mg/kg | Freq: Once | INTRAVENOUS | Status: AC
  Administered 2024-12-09: 600 mg via INTRAVENOUS
  Filled 2024-12-09: qty 28.57

## 2024-12-09 NOTE — Progress Notes (Signed)
 "    Hematology/Oncology Consult note [facility]  [phone] [phone]  Patient Care Team: Gretta Comer POUR, NP as PCP - General (Internal Medicine) Trixie File, MD as Consulting Physician (Internal Medicine) Georgina Shasta POUR, RN as Oncology Nurse Navigator Melanee Annah BROCKS, MD as Consulting Physician (Oncology) Lenn Aran, MD as Consulting Physician (Radiation Oncology)   Name of the patient: Rebecca Macias  [id number]  05-14-1961   Date of visit: 12/09/2024  Diagnosis-  Cancer Staging  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 in female, estrogen receptor negative (HCC) Staging form: Breast, AJCC 8th Edition - Clinical stage from 10/28/2023: Stage IA (cT1b, cN0, cM0, G2, ER-, PR-, HER2+) - Signed by Melanee Annah BROCKS, MD on 10/28/2023 Histologic grading system: 3 grade system - Pathologic stage from 11/20/2023: Stage IA (pT1b, pN0, cM0, G3, ER-, PR-, HER2+) - Signed by Melanee Annah BROCKS, MD on 11/20/2023 Stage prefix: Initial diagnosis Multigene prognostic tests performed: None Histologic grading system: 3 grade system    Chief complaint/ Reason for visit-on treatment assessment prior to cycle 13 of adjuvant Herceptin   Heme/Onc history: Patient is a 64 year old female with a past medical history significant for type 2 diabetes and hyperlipidemia who underwent screening mammogram in October 2024 which showed 0.6 x 0.5 x 0.4 cm hypoechoic mass in the left breast at the 9 o'clock position in the retroareolar area.  Ultrasound of the left axilla demonstrated normal lymph nodes.  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  8 mm.  Tumor was ER/PR -0% and HER2 positive +3 by IHC.    Menarche at the age of 72.  She is G5, P5.  Age at first birth 8.  She has used birth control pills in the past.  She attained menopause in her 39s.  Family history significant  for breast cancer in 2 sisters of her maternal grandmother.  Mom with cervical cancer in her 59s.  Paternal uncle with stomach cancer.  Her sister has been diagnosed with some form of skin cancer.   Left lumpectomy pathology from 11/04/2023 showed 10 mm grade 3 tumor with negative margins.  3 sentinel lymph nodes negative for malignancy.  Tumor was ER/PR negative and HER2 positive.  Patient had significant infusion reaction with first cycle of Taxol .  She subsequently went on to do 9 cycles of weekly Abraxane  and Herceptin .  She had worsening neuropathy skin rash and edema with Abraxane  and was subsequently stopped.  She is currently undergoing adjuvant radiation therapy and receiving maintenance Herceptin     Interval history- Rebecca Macias is a 64 year old female with stage I HER2-positive, ER/PR-negative breast cancer who presents for completion of adjuvant therapy and management of her vascular access port.  She is completing her final cycle of adjuvant Taxol  and Herceptin  after one year of treatment for HER2-positive, ER/PR-negative breast cancer. She is not on adjuvant endocrine therapy due to hormone receptor negativity.  She has an indwelling vascular access port, which is no longer required following completion of therapy. Over the past week, she has experienced localized aching, tightness, and a pulling sensation around the port site, without evidence of port mobility.  She recently developed a palpable abdominal mass after coughing during a recent illness, which was evaluated by CT and is suspected to be a hernia. She has also experienced recent constipation and abdominal discomfort, but denies acute gastrointestinal symptoms at this time.    ECOG  PS- 0 Pain scale- 3  Review of systems- Review of Systems  Constitutional:  Negative for chills, fever, malaise/fatigue and weight loss.  HENT:  Negative for congestion, ear discharge and nosebleeds.   Eyes:  Negative for blurred  vision.  Respiratory:  Negative for cough, hemoptysis, sputum production, shortness of breath and wheezing.   Cardiovascular:  Negative for chest pain, palpitations, orthopnea and claudication.  Gastrointestinal:  Negative for abdominal pain, blood in stool, constipation, diarrhea, heartburn, melena, nausea and vomiting.  Genitourinary:  Negative for dysuria, flank pain, frequency, hematuria and urgency.  Musculoskeletal:  Positive for joint pain (Right shoulder pain). Negative for back pain and myalgias.  Skin:  Negative for rash.  Neurological:  Negative for dizziness, tingling, focal weakness, seizures, weakness and headaches.  Endo/Heme/Allergies:  Does not bruise/bleed easily.  Psychiatric/Behavioral:  Negative for depression and suicidal ideas. The patient does not have insomnia.       Allergies[1]   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, estrogen receptor negative (HCC)    Obesity    Ovarian cyst    Palpitations    Personal history of chemotherapy    Personal history of radiation therapy    Sigmoid diverticulitis 08/2012   Subclinical hypothyroidism    SVT (supraventricular tachycardia)    on Holter monitor 5 beat run of svt   Thyroid  nodule    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   Type 2 diabetes mellitus (HCC)      Past Surgical History:  Procedure Laterality Date   AXILLARY SENTINEL NODE BIOPSY Left 11/04/2023   Procedure: AXILLARY SENTINEL NODE BIOPSY;  Surgeon: Lane Shope, MD;  Location: [facility] ORS;  Service: General;  Laterality: Left;   BLADDER SUSPENSION  2009   BREAST BIOPSY Left 10/22/2023   Us  Core Bx, heart clip - path pending   BREAST BIOPSY Left 10/22/2023    US  LT BREAST BX W LOC DEV 1ST LESION IMG BX SPEC US  GUIDE 10/22/2023 [facility]   BREAST LUMPECTOMY WITH RADIOFREQUENCY TAG IDENTIFICATION Left 11/04/2023   Procedure: BREAST LUMPECTOMY WITH RADIOFREQUENCY TAG IDENTIFICATION;  Surgeon: Lane Shope, MD;  Location: [facility] ORS;  Service: General;  Laterality: Left;   Colectomy Left    likely sigmoid, open.  for diverticulitis.   COLONOSCOPY WITH PROPOFOL  N/A 10/01/2022   Procedure: COLONOSCOPY WITH PROPOFOL ;  Surgeon: Unk Corinn Skiff, MD;  Location: [facility] ENDOSCOPY;  Service: Gastroenterology;  Laterality: N/A;   PORTACATH PLACEMENT Right 11/04/2023   Procedure: INSERTION PORT-A-CATH;  Surgeon: Lane Shope, MD;  Location: [facility] ORS;  Service: General;  Laterality: Right;   TUBAL LIGATION     XI ROBOTIC LAPAROSCOPIC ASSISTED APPENDECTOMY N/A 12/29/2022   Procedure: XI ROBOTIC LYSIS OF ADHESIONS AND APPENDECTOMY;  Surgeon: Lane Shope, MD;  Location: [facility] ORS;  Service: General;  Laterality: N/A;    Social History   Socioeconomic History   Marital status: Single    Spouse name: Not on file   Number of children: Not on file   Years of education: Not on file   Highest education level: Some college, no degree  Occupational History   Not on file  Tobacco Use   Smoking status: Former  Current packs/day: 0.00    Types: Cigarettes, Cigars    Quit date: 09/19/2011    Years since quitting: 13.2   Smokeless tobacco: Never  Vaping Use   Vaping status: Former  Substance and Sexual Activity   Alcohol use: Not Currently    Comment: Occasional glass of wine   Drug use: No   Sexual activity: Not Currently  Other Topics Concern   Not on file  Social History Narrative   Works in audiological scientist at [employer].   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   Social Drivers of Health   Tobacco Use: Medium Risk (12/09/2024)   Patient History    Smoking Tobacco Use: Former    Smokeless Tobacco Use: Never    Passive  Exposure: Not on file  Financial Resource Strain: Low Risk (06/17/2023)   Overall Financial Resource Strain (CARDIA)    Difficulty of Paying Living Expenses: Not very hard  Food Insecurity: No Food Insecurity (10/28/2023)   Hunger Vital Sign    Worried About Running Out of Food in the Last Year: Never true    Ran Out of Food in the Last Year: Never true  Transportation Needs: No Transportation Needs (10/28/2023)   PRAPARE - Administrator, Civil Service (Medical): No    Lack of Transportation (Non-Medical): No  Physical Activity: Sufficiently Active (06/17/2023)   Exercise Vital Sign    Days of Exercise per Week: 7 days    Minutes of Exercise per Session: 60 min  Stress: Stress Concern Present (06/17/2023)   [employer] of Occupational Health - Occupational Stress Questionnaire    Feeling of Stress : To some extent  Social Connections: Moderately Integrated (06/17/2023)   Social Connection and Isolation Panel    Frequency of Communication with Friends and Family: More than three times a week    Frequency of Social Gatherings with Friends and Family: Once a week    Attends Religious Services: More than 4 times per year    Active Member of [employer] or Organizations: Yes    Attends Engineer, Structural: More than 4 times per year    Marital Status: Divorced  Intimate Partner Violence: Not At Risk (10/28/2023)   Humiliation, Afraid, Rape, and Kick questionnaire    Fear of Current or Ex-Partner: No    Emotionally Abused: No    Physically Abused: No    Sexually Abused: No  Depression (PHQ2-9): Low Risk (12/09/2024)   Depression (PHQ2-9)    PHQ-2 Score: 0  Alcohol Screen: Not on file  Housing: Low Risk (10/28/2023)   Housing    Last Housing Risk Score: 0  Utilities: Not At Risk (10/28/2023)   AHC Utilities    Threatened with loss of utilities: No  Health Literacy: Not on file    Family History  Problem Relation Age of Onset   Cancer - Cervical Mother         dx 66s; Passed away from Kidney Issues (Not Cancer)   Heart disease Father    Kidney disease Father    Diabetes Father    Skin cancer Sister    Stomach cancer Paternal Uncle    Diabetes Maternal Grandmother    Skin cancer Maternal Grandfather    Diabetes Paternal Grandmother    Breast cancer Other        mat great aunts x2; unk age of dx   Lung cancer Cousin    Brain cancer Cousin     Current Medications[2]  Physical  exam:  Vitals:   12/09/24 0916  BP: 116/83  Pulse: 80  Resp: 19  Temp: (!) 95.9 F (35.5 C)  TempSrc: Tympanic  SpO2: 98%  Weight: 208 lb 12.8 oz (94.7 kg)  Height: 5' 5 (1.651 m)   Physical Exam Cardiovascular:     Rate and Rhythm: Normal rate and regular rhythm.     Heart sounds: Normal heart sounds.  Pulmonary:     Effort: Pulmonary effort is normal.     Breath sounds: Normal breath sounds.  Skin:    General: Skin is warm and dry.  Neurological:     Mental Status: She is alert and oriented to person, place, and time.      I have personally reviewed labs listed below:    Latest Ref Rng & Units 11/11/2024    8:58 AM  CMP  Glucose 70 - 99 mg/dL 810   BUN 8 - 23 mg/dL 8   Creatinine 9.55 - 8.99 mg/dL 9.34   Sodium 864 - 854 mmol/L 141   Potassium 3.5 - 5.1 mmol/L 3.7   Chloride 98 - 111 mmol/L 105   CO2 22 - 32 mmol/L 24   Calcium  8.9 - 10.3 mg/dL 9.2   Total Protein 6.5 - 8.1 g/dL 7.2   Total Bilirubin 0.0 - 1.2 mg/dL 0.6   Alkaline Phos 38 - 126 U/L 103   AST 15 - 41 U/L 31   ALT 0 - 44 U/L 19       Latest Ref Rng & Units 11/11/2024    8:58 AM  CBC  WBC 4.0 - 10.5 K/uL 7.0   Hemoglobin 12.0 - 15.0 g/dL 87.7   Hematocrit 63.9 - 46.0 % 35.9   Platelets 150 - 400 K/uL 283      Assessment and plan- Patient is a 64 y.o. 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 She is s/p 9 weekly cycles of Abraxane  and Herceptin  which was  subsequently stopped due to intolerance.  She is here for on treatment assessment prior to cycle 16 of adjuvant Herceptin   Assessment and Plan    Stage I HER2-positive left breast cancer Counts okay to proceed with cycle 16 of adjuvant Herceptin  today which would be her last cycle.  Clinically she is doing well and has tolerated treatments well so far. Not eligible for endocrine therapy due to ER/PR negativity. Ongoing surveillance required. - Recommended ongoing surveillance with periodic breast examinations. - Advised annual mammography.  No role for routine surveillance imaging unless there are any suspicious signs and symptoms noted. - Scheduled follow-up in four months.  Indwelling vascular access port, management Port no longer needed post-therapy. Localized discomfort likely musculoskeletal. Removal planned. - Planned port removal within the next few months, potentially with hernia repair surgery. - Reassured discomfort likely musculoskeletal.         Visit Diagnosis 1. Encounter for monoclonal antibody treatment for malignancy   2. 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 in female, estrogen receptor negative (HCC)      Dr. Annah Skene, MD, MPH [facility] [phone] 12/09/2024 10:48 AM                   [1]  Allergies Allergen Reactions   Paclitaxel  Shortness Of Breath, Other (See Comments) and Hypertension    Redness to face/chest Back Pain     Flagyl [  Metronidazole] Itching  [2]  Current Outpatient Medications:    acetaminophen  (TYLENOL ) 500 MG tablet, Take 500 mg by mouth every 6 (six) hours as needed., Disp: , Rfl:    albuterol  (VENTOLIN  HFA) 108 (90 Base) MCG/ACT inhaler, Inhale 2 puffs into the lungs every 6 (six) hours as needed for wheezing or shortness of breath., Disp: 8 g, Rfl: 0   atenolol  (TENORMIN ) 25 MG tablet, TAKE 1 TABLET (25 MG TOTAL) BY MOUTH DAILY. FOR PALPITATIONS., Disp: 90 tablet, Rfl:  2   Biotin 1000 MCG tablet, Take 1,000 mcg by mouth daily., Disp: , Rfl:    blood glucose meter kit and supplies KIT, Dispense based on patient and insurance preference. Use up to four times daily as directed. (FOR ICD-9 250.00, 250.01)., Disp: 1 each, Rfl: 0   cyanocobalamin 500 MCG tablet, Take 1,000 mcg by mouth daily. Reported on 06/12/2016, Disp: , Rfl:    docusate sodium (COLACE) 100 MG capsule, Take 200 mg by mouth daily as needed for moderate constipation. Reported on 06/12/2016, Disp: , Rfl:    ibuprofen  (ADVIL ) 800 MG tablet, Take 1 tablet (800 mg total) by mouth every 8 (eight) hours as needed., Disp: 30 tablet, Rfl: 0   lidocaine -prilocaine  (EMLA ) cream, APPLY TO AFFECTED AREA ONCE AS DIRECTED, Disp: 30 g, Rfl: 3   metFORMIN  (GLUCOPHAGE -XR) 500 MG 24 hr tablet, TAKE 1 TABLET (500 MG TOTAL) BY MOUTH DAILY WITH BREAKFAST. FOR DIABETES., Disp: 90 tablet, Rfl: 1   promethazine -dextromethorphan (PROMETHAZINE -DM) 6.25-15 MG/5ML syrup, Take 5 mLs by mouth at bedtime., Disp: 118 mL, Rfl: 0   pyridOXINE (VITAMIN B6) 25 MG tablet, Take 25 mg by mouth daily., Disp: , Rfl:    rosuvastatin  (CRESTOR ) 5 MG tablet, TAKE 1 TABLET (5 MG TOTAL) BY MOUTH EVERY EVENING. FOR CHOLESTEROL., Disp: 90 tablet, Rfl: 2   Semaglutide ,0.25 or 0.5MG /DOS, (OZEMPIC , 0.25 OR 0.5 MG/DOSE,) 2 MG/3ML SOPN, Inject 0.5 mg into the skin once a week. for diabetes., Disp: 9 mL, Rfl: 0   silver  sulfADIAZINE  (SILVADENE ) 1 % cream, Apply 1 Application topically 2 (two) times daily., Disp: 50 g, Rfl: 0 No current facility-administered medications for this visit.  Facility-Administered Medications Ordered in Other Visits:    0.9 %  sodium chloride  infusion, , Intravenous, Continuous, Melanee Annah BROCKS, MD, Stopped at 11/27/23 1158   0.9 %  sodium chloride  infusion, , Intravenous, Continuous, Melanee Annah BROCKS, MD, Last Rate: 10 mL/hr at 12/09/24 1010, New Bag at 12/09/24 1010   trastuzumab -anns (KANJINTI ) 600 mg in sodium chloride  0.9 % 250 mL  chemo infusion, 6 mg/kg (Treatment Plan Recorded), Intravenous, Once, Melanee Annah BROCKS, MD, Last Rate: 557.1 mL/hr at 12/09/24 1039, 600 mg at 12/09/24 1039  "

## 2024-12-09 NOTE — Progress Notes (Signed)
 Patient has some concerns to address regarding port during today's visit.

## 2024-12-12 ENCOUNTER — Encounter: Payer: Self-pay | Admitting: *Deleted

## 2025-01-12 ENCOUNTER — Ambulatory Visit: Admitting: General Surgery

## 2025-01-12 ENCOUNTER — Encounter: Payer: Self-pay | Admitting: General Surgery

## 2025-01-12 VITALS — BP 124/77 | HR 93 | Ht 65.0 in | Wt 214.0 lb

## 2025-01-12 DIAGNOSIS — K439 Ventral hernia without obstruction or gangrene: Secondary | ICD-10-CM | POA: Diagnosis not present

## 2025-01-12 DIAGNOSIS — Z452 Encounter for adjustment and management of vascular access device: Secondary | ICD-10-CM

## 2025-01-12 NOTE — Patient Instructions (Addendum)
 Give us  a call when you are ready to schedule surgery for your hernia, in the mean time please give our office a call if you have any questions or concerns   Today we have removed your Port in our office. Please see information below regarding this.  You are free to shower 01/13/25.  Do not scrub the area.  Your incision was closed with Dermabond.  It is best to keep it clean and dry, it will tolerate a brief shower, but do not soak it or apply any creams or lotions to the incisions.  The Dermabond should gradually flake off over time.  Keep it open to air so you can evaluate your incisions.  Dermabond assists the underlying sutures to keep your incision closed and protected from infection.  Should you develop some drainage from your incision, some drops of drainage would be okay but if it persists continue to put keep a dry dressing over it.   You have glue on your skin and sutures under the skin. The glue will come off on it's own in 10-14 days. You may shower normally until this occurs but do not submerge.  Please use Tylenol  or Ibuprofen  for pain as needed. You may use ice to the area 3-4 times today and tomorrow for any achiness.   We will see you back in 7-10 days to ensure that this has healed.You may continue your regular activities right away but if you are having pain while doing something, stop what you are doing and try this activity once again in 3 days. Please call our office with any questions or concerns prior to your appointment.     Implanted Port Removal Implanted port removal is a procedure to remove the port and catheter (port-a-cath) that is implanted under your skin. The port is a small disc under your skin that can be punctured with a needle. It is connected to a vein in your chest or neck by a small flexible tube (catheter). The port-a-cath is used for treatment through an IV tube and for taking blood samples. Your health care provider will remove the port-a-cath if: You  no longer need it for treatment. It is not working properly. The area around it gets infected.  Tell a health care provider about: Any allergies you have. All medicines you are taking, including vitamins, herbs, eye drops, creams, and over-the-counter medicines. Any problems you or family members have had with anesthetic medicines. Any blood disorders you have. Any surgeries you have had. Any medical conditions you have. Whether you are pregnant or may be pregnant. What are the risks? Generally, this is a safe procedure. However, problems may occur, including: Infection. Bleeding. Allergic reactions to anesthetic medicines. Damage to nerves or blood vessels.

## 2025-01-12 NOTE — Progress Notes (Signed)
 Outpatient Surgical Follow Up  01/12/2025  Rebecca Macias is an 64 y.o. female.   Chief Complaint  Patient presents with   Follow-up    HPI: Patient returns today to discuss her hernia.  She has a ventral hernia.  She says that she does have some pain when she coughs.  She also reports a bulge but that it does get smaller when she lays down.  She denies any overlying skin changes.  She is having a grandchild in March and is unsure if she wants it repaired.  She also reports that she is here for a port removal.  She says that the port is giving her pain.  She has finished her Herceptin  infusions in December of last year.  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, estrogen receptor negative (HCC)    Obesity    Ovarian cyst    Palpitations    Personal history of chemotherapy    Personal history of radiation therapy    Sigmoid diverticulitis 08/2012   Subclinical hypothyroidism    SVT (supraventricular tachycardia)    on Holter monitor 5 beat run of svt   Thyroid  nodule    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   Type 2 diabetes mellitus (HCC)     Past Surgical History:  Procedure Laterality Date   AXILLARY SENTINEL NODE BIOPSY Left 11/04/2023   Procedure: AXILLARY SENTINEL NODE BIOPSY;  Surgeon: Lane Shope, MD;  Location: [facility];  Service: General;  Laterality: Left;   BLADDER SUSPENSION  2009   BREAST BIOPSY Left 10/22/2023   Us  Core Bx, heart clip - path pending   BREAST BIOPSY Left 10/22/2023   US  LT BREAST BX W LOC DEV 1ST LESION IMG BX SPEC US  GUIDE 10/22/2023 [facility]   BREAST LUMPECTOMY WITH RADIOFREQUENCY TAG IDENTIFICATION Left 11/04/2023   Procedure: BREAST  LUMPECTOMY WITH RADIOFREQUENCY TAG IDENTIFICATION;  Surgeon: Lane Shope, MD;  Location: [facility];  Service: General;  Laterality: Left;   Colectomy Left    likely sigmoid, open.  for diverticulitis.   COLONOSCOPY WITH PROPOFOL  N/A 10/01/2022   Procedure: COLONOSCOPY WITH PROPOFOL ;  Surgeon: Unk Corinn Skiff, MD;  Location: [facility];  Service: Gastroenterology;  Laterality: N/A;   PORTACATH PLACEMENT Right 11/04/2023   Procedure: INSERTION PORT-A-CATH;  Surgeon: Lane Shope, MD;  Location: [facility];  Service: General;  Laterality: Right;   TUBAL LIGATION     XI ROBOTIC LAPAROSCOPIC ASSISTED APPENDECTOMY N/A 12/29/2022   Procedure: XI ROBOTIC LYSIS OF ADHESIONS AND APPENDECTOMY;  Surgeon: Lane Shope, MD;  Location: [facility];  Service: General;  Laterality: N/A;    Family History  Problem Relation Age of Onset   Cancer - Cervical Mother        dx 60s; Passed away from Kidney Issues (Not Cancer)   Heart disease Father    Kidney disease Father    Diabetes Father    Skin cancer Sister    Stomach cancer Paternal Uncle    Diabetes Maternal Grandmother    Skin cancer Maternal Grandfather    Diabetes Paternal Grandmother    Breast cancer Other        mat great aunts x2;  unk age of dx   Lung cancer Cousin    Brain cancer Cousin     Social History:  reports that she quit smoking about 13 years ago. Her smoking use included cigarettes and cigars. She has never used smokeless tobacco. She reports that she does not currently use alcohol. She reports that she does not use drugs.  Allergies: Allergies[1]  Medications reviewed.    ROS Full ROS performed and is otherwise negative other than what is stated in HPI   BP 124/77   Pulse 93   Ht 5' 5 (1.651 m)   Wt 214 lb (97.1 kg)   LMP 11/03/2012   SpO2 99%   BMI 35.61 kg/m   Physical Exam Right upper chest there is a well-healed scar and right under this is a palpable port. Abdomen is soft, obese, tender to  palpation right over the hernia with deep palpation.  It is difficult to tell whether I am able to fully reduce it.  No overlying skin changes.    No results found for this or any previous visit (from the past 48 hours). No results found.  Assessment/Plan:  Patient with ventral hernia.  This is fat-containing on CT scan.  She would like to wait at this time given that she is going to be taking care of her grandchild that is due in March.  I discussed hernia warning signs with her.  If she would like it repaired then she will give our office a call.  Port in place   Procedure note Procedure: Port Removal  After informed consent was obtained the patient was placed in an upright position in our procedure room.  Her right upper chest was then prepped and draped in usual sterile fashion.  A surgical timeout was called identifying correct patient, site, side and procedure.  8 cc of 1% lidocaine  with epinephrine  was then injected over the port for patient comfort.  An incision was made in the port and this was taken down to the port capsule.  The capsule was dissected off of the port and the sutures anchoring the port and were cut.  The port was then eviscerated out of the cavity.  I did dissect along the length of the catheter up towards her clavicle.  The port and catheter were then pulled and direct pressure was held for 1 minute.  The catheter was pulled out intact.  The deep dermal layer was then closed with 3-0 Vicryl and the skin was closed with 4 Monocryl and dressed with glue.  The patient tolerated the procedure well.  A total of 32 minutes was spent reviewing the patient's chart, performing history and physical and discussing treatment options with the patient.  This is irrespective of the time spent to perform the port removal  Jayson Endow, M.D. [facility]      [1]  Allergies Allergen Reactions   Paclitaxel  Shortness Of Breath, Other (See Comments) and  Hypertension    Redness to face/chest Back Pain     Flagyl [Metronidazole] Itching

## 2025-02-28 ENCOUNTER — Ambulatory Visit: Admitting: Primary Care

## 2025-04-14 ENCOUNTER — Inpatient Hospital Stay

## 2025-04-14 ENCOUNTER — Inpatient Hospital Stay: Admitting: Oncology

## 2025-04-19 ENCOUNTER — Ambulatory Visit: Admitting: Radiation Oncology
